# Patient Record
Sex: Female | Born: 1945 | Race: White | Hispanic: No | Marital: Married
Health system: Midwestern US, Community
[De-identification: ages and names within clinical notes are randomized; demographics above are authoritative.]

## PROBLEM LIST (undated history)

## (undated) DIAGNOSIS — M949 Disorder of cartilage, unspecified: Secondary | ICD-10-CM

## (undated) DIAGNOSIS — M899 Disorder of bone, unspecified: Secondary | ICD-10-CM

## (undated) DIAGNOSIS — R609 Edema, unspecified: Secondary | ICD-10-CM

## (undated) DIAGNOSIS — Z1231 Encounter for screening mammogram for malignant neoplasm of breast: Secondary | ICD-10-CM

## (undated) DIAGNOSIS — M25569 Pain in unspecified knee: Secondary | ICD-10-CM

## (undated) DIAGNOSIS — I10 Essential (primary) hypertension: Secondary | ICD-10-CM

## (undated) DIAGNOSIS — R002 Palpitations: Secondary | ICD-10-CM

## (undated) DIAGNOSIS — I519 Heart disease, unspecified: Secondary | ICD-10-CM

## (undated) DIAGNOSIS — E039 Hypothyroidism, unspecified: Secondary | ICD-10-CM

## (undated) DIAGNOSIS — R519 Headache, unspecified: Secondary | ICD-10-CM

## (undated) DIAGNOSIS — Z1211 Encounter for screening for malignant neoplasm of colon: Secondary | ICD-10-CM

## (undated) DIAGNOSIS — M8580 Other specified disorders of bone density and structure, unspecified site: Secondary | ICD-10-CM

## (undated) DIAGNOSIS — N959 Unspecified menopausal and perimenopausal disorder: Secondary | ICD-10-CM

## (undated) DIAGNOSIS — S83209A Unspecified tear of unspecified meniscus, current injury, unspecified knee, initial encounter: Secondary | ICD-10-CM

## (undated) DIAGNOSIS — Z01818 Encounter for other preprocedural examination: Secondary | ICD-10-CM

## (undated) DIAGNOSIS — R0602 Shortness of breath: Secondary | ICD-10-CM

## (undated) DIAGNOSIS — M1990 Unspecified osteoarthritis, unspecified site: Secondary | ICD-10-CM

## (undated) DIAGNOSIS — G4700 Insomnia, unspecified: Secondary | ICD-10-CM

## (undated) DIAGNOSIS — R7303 Prediabetes: Secondary | ICD-10-CM

## (undated) DIAGNOSIS — R112 Nausea with vomiting, unspecified: Secondary | ICD-10-CM

## (undated) DIAGNOSIS — T4145XA Adverse effect of unspecified anesthetic, initial encounter: Secondary | ICD-10-CM

## (undated) DIAGNOSIS — Z9889 Other specified postprocedural states: Secondary | ICD-10-CM

## (undated) DIAGNOSIS — E785 Hyperlipidemia, unspecified: Secondary | ICD-10-CM

## (undated) DIAGNOSIS — T8859XA Other complications of anesthesia, initial encounter: Secondary | ICD-10-CM

## (undated) DIAGNOSIS — K8020 Calculus of gallbladder without cholecystitis without obstruction: Secondary | ICD-10-CM

## (undated) DIAGNOSIS — K219 Gastro-esophageal reflux disease without esophagitis: Secondary | ICD-10-CM

## (undated) DIAGNOSIS — Z1239 Encounter for other screening for malignant neoplasm of breast: Principal | ICD-10-CM

## (undated) HISTORY — DX: Insomnia, unspecified: G47.00

## (undated) HISTORY — DX: Other specified postprocedural states: Z98.890

## (undated) HISTORY — PX: EYE SURGERY: SHX253

## (undated) HISTORY — DX: Hyperlipidemia, unspecified: E78.5

## (undated) HISTORY — PX: VAGINAL HYSTERECTOMY: SUR661

## (undated) HISTORY — DX: Calculus of gallbladder without cholecystitis without obstruction: K80.20

## (undated) HISTORY — PX: DILATION AND CURETTAGE OF UTERUS: SHX78

## (undated) HISTORY — PX: CARDIAC CATHETERIZATION: SHX172

## (undated) HISTORY — PX: ABDOMINAL HYSTERECTOMY: SHX81

## (undated) HISTORY — DX: Prediabetes: R73.03

---
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
## 1990-06-06 HISTORY — PX: BACK SURGERY: SHX140

## 1998-10-28 ENCOUNTER — Other Ambulatory Visit: Admission: RE | Admit: 1998-10-28 | Discharge: 1998-10-28 | Payer: Self-pay | Admitting: Obstetrics & Gynecology

## 2000-01-24 ENCOUNTER — Other Ambulatory Visit: Admission: RE | Admit: 2000-01-24 | Discharge: 2000-01-24 | Payer: Self-pay | Admitting: Obstetrics & Gynecology

## 2000-10-25 ENCOUNTER — Inpatient Hospital Stay (HOSPITAL_COMMUNITY): Admission: RE | Admit: 2000-10-25 | Discharge: 2000-10-28 | Payer: Self-pay | Admitting: Obstetrics & Gynecology

## 2000-10-25 ENCOUNTER — Encounter (INDEPENDENT_AMBULATORY_CARE_PROVIDER_SITE_OTHER): Payer: Self-pay

## 2001-07-05 ENCOUNTER — Other Ambulatory Visit: Admission: RE | Admit: 2001-07-05 | Discharge: 2001-07-05 | Payer: Self-pay | Admitting: Obstetrics & Gynecology

## 2001-11-12 ENCOUNTER — Ambulatory Visit (HOSPITAL_COMMUNITY): Admission: RE | Admit: 2001-11-12 | Discharge: 2001-11-12 | Payer: Self-pay | Admitting: Internal Medicine

## 2001-11-15 ENCOUNTER — Other Ambulatory Visit: Admission: RE | Admit: 2001-11-15 | Discharge: 2001-11-15 | Payer: Self-pay | Admitting: Dermatology

## 2002-08-19 ENCOUNTER — Other Ambulatory Visit: Admission: RE | Admit: 2002-08-19 | Discharge: 2002-08-19 | Payer: Self-pay | Admitting: Obstetrics & Gynecology

## 2002-09-02 ENCOUNTER — Encounter: Payer: Self-pay | Admitting: Family Medicine

## 2002-09-02 ENCOUNTER — Ambulatory Visit (HOSPITAL_COMMUNITY): Admission: RE | Admit: 2002-09-02 | Discharge: 2002-09-02 | Payer: Self-pay | Admitting: Family Medicine

## 2003-06-07 DIAGNOSIS — Z9889 Other specified postprocedural states: Secondary | ICD-10-CM

## 2003-06-07 HISTORY — DX: Other specified postprocedural states: Z98.890

## 2003-10-24 ENCOUNTER — Other Ambulatory Visit: Admission: RE | Admit: 2003-10-24 | Discharge: 2003-10-24 | Payer: Self-pay | Admitting: Specialist

## 2003-10-24 ENCOUNTER — Other Ambulatory Visit: Admission: RE | Admit: 2003-10-24 | Discharge: 2003-10-24 | Payer: Self-pay | Admitting: Obstetrics & Gynecology

## 2003-12-30 ENCOUNTER — Other Ambulatory Visit: Admission: RE | Admit: 2003-12-30 | Discharge: 2003-12-30 | Payer: Self-pay | Admitting: Obstetrics & Gynecology

## 2004-02-25 ENCOUNTER — Ambulatory Visit (HOSPITAL_COMMUNITY): Admission: RE | Admit: 2004-02-25 | Discharge: 2004-02-25 | Payer: Self-pay | Admitting: Family Medicine

## 2004-04-12 ENCOUNTER — Ambulatory Visit (HOSPITAL_COMMUNITY): Admission: RE | Admit: 2004-04-12 | Discharge: 2004-04-12 | Payer: Self-pay | Admitting: Family Medicine

## 2004-04-27 ENCOUNTER — Ambulatory Visit (HOSPITAL_COMMUNITY): Admission: RE | Admit: 2004-04-27 | Discharge: 2004-04-27 | Payer: Self-pay | Admitting: Family Medicine

## 2004-04-27 ENCOUNTER — Inpatient Hospital Stay (HOSPITAL_COMMUNITY): Admission: AD | Admit: 2004-04-27 | Discharge: 2004-04-29 | Payer: Self-pay | Admitting: Family Medicine

## 2004-04-28 ENCOUNTER — Ambulatory Visit: Payer: Self-pay | Admitting: *Deleted

## 2004-05-05 ENCOUNTER — Ambulatory Visit: Payer: Self-pay | Admitting: *Deleted

## 2004-06-17 ENCOUNTER — Other Ambulatory Visit: Admission: RE | Admit: 2004-06-17 | Discharge: 2004-06-17 | Payer: Self-pay | Admitting: Obstetrics & Gynecology

## 2004-08-03 ENCOUNTER — Ambulatory Visit: Payer: Self-pay | Admitting: *Deleted

## 2004-11-11 ENCOUNTER — Other Ambulatory Visit: Admission: RE | Admit: 2004-11-11 | Discharge: 2004-11-11 | Payer: Self-pay | Admitting: Obstetrics & Gynecology

## 2005-05-02 ENCOUNTER — Ambulatory Visit: Payer: Self-pay | Admitting: Orthopedic Surgery

## 2005-05-09 ENCOUNTER — Other Ambulatory Visit: Admission: RE | Admit: 2005-05-09 | Discharge: 2005-05-09 | Payer: Self-pay | Admitting: Obstetrics & Gynecology

## 2005-07-04 ENCOUNTER — Other Ambulatory Visit: Admission: RE | Admit: 2005-07-04 | Discharge: 2005-07-04 | Payer: Self-pay | Admitting: Dermatology

## 2006-01-11 ENCOUNTER — Ambulatory Visit: Payer: Self-pay | Admitting: Orthopedic Surgery

## 2006-02-01 ENCOUNTER — Ambulatory Visit (HOSPITAL_COMMUNITY): Admission: RE | Admit: 2006-02-01 | Discharge: 2006-02-01 | Payer: Self-pay | Admitting: Family Medicine

## 2007-01-19 ENCOUNTER — Ambulatory Visit (HOSPITAL_COMMUNITY): Admission: RE | Admit: 2007-01-19 | Discharge: 2007-01-19 | Payer: Self-pay | Admitting: Family Medicine

## 2009-01-08 LAB — HEPATIC FUNCTION PANEL
A-G Ratio: 1.3 (ref 0.8–1.7)
ALT (SGPT): 37 U/L (ref 30–65)
AST (SGOT): 29 U/L (ref 15–37)
Albumin: 4.2 g/dL (ref 3.4–5.0)
Alk. phosphatase: 139 U/L — ABNORMAL HIGH (ref 50–136)
Bilirubin, direct: 0.2 MG/DL (ref 0.0–0.3)
Bilirubin, total: 0.7 MG/DL (ref 0.1–0.9)
Globulin: 3.3 g/dL (ref 2.0–4.0)
Protein, total: 7.5 g/dL (ref 6.4–8.2)

## 2009-01-08 LAB — TSH 3RD GENERATION: TSH: 0.32 u[IU]/mL — ABNORMAL LOW (ref 0.51–6.27)

## 2009-01-08 LAB — LIPID PANEL
CHOL/HDL Ratio: 2.8 (ref 0–5.0)
Cholesterol, total: 178 MG/DL (ref 0–200)
HDL Cholesterol: 63 MG/DL — ABNORMAL HIGH (ref 40–60)
LDL, calculated: 99.6 MG/DL (ref 0–100)
Triglyceride: 77 MG/DL (ref 0–150)
VLDL, calculated: 15.4 MG/DL

## 2009-01-08 LAB — METABOLIC PANEL, BASIC
Anion gap: 6 mmol/L (ref 5–15)
BUN/Creatinine ratio: 21 — ABNORMAL HIGH (ref 12–20)
BUN: 19 MG/DL — ABNORMAL HIGH (ref 7–18)
CO2: 32 MMOL/L (ref 21–32)
Calcium: 9.6 MG/DL (ref 8.4–10.4)
Chloride: 106 MMOL/L (ref 100–108)
Creatinine: 0.9 MG/DL (ref 0.6–1.3)
GFR est AA: >60 mL/min/{1.73_m2} (ref >60)
GFR est non-AA: >60 mL/min/{1.73_m2} (ref >60)
Glucose: 102 MG/DL — ABNORMAL HIGH (ref 74–99)
Potassium: 4.5 MMOL/L (ref 3.5–5.5)
Sodium: 144 MMOL/L (ref 136–145)

## 2009-01-14 LAB — CULTURE, URINE
Culture result:: NO GROWTH
Culture: NO GROWTH

## 2009-02-03 ENCOUNTER — Ambulatory Visit (HOSPITAL_COMMUNITY): Admission: RE | Admit: 2009-02-03 | Discharge: 2009-02-03 | Payer: Self-pay | Admitting: Family Medicine

## 2010-10-22 NOTE — Op Note (Signed)
[facility]  Patient:    Terri Douglas, Terri Douglas Visit Number: [id number] MRN: [id number]          Service Type: END Location: DAY Attending Physician:  Jonathon Bellows Dictated by:   Roetta Sessions, M.D. Proc. Date: 11/12/01 Admit Date:  11/12/2001   CC:         Dr. Gerda Diss   Operative Report  PROCEDURE:  Screening colonoscopy.  INDICATIONS FOR PROCEDURE:  The patient is a 65 year old lady referred through the courtesy Dr. Gerda Diss for colorectal cancer screening. She is devoid of any lower GI tract symptoms. There is no family history of colorectal neoplasia. She has never had her lower GI tract imaged previously. Colonoscopy is now being done as a standard screening maneuver. This approach has been discussed with the patient at length at the bedside. The potential risks, benefits, and alternatives have been reviewed, questions answered. She is agreeable. She is low risk for conscious sedation and we have Versed and Demerol. Please see my handwritten H&P for more information.  MONITORING:  O2 saturations, blood pressure, pulse and respirations were monitored throughout the entire procedure.  CONSCIOUS SEDATION:  Versed 6 mg IV, Demerol 125 IV in divided doses.  INSTRUMENT:  Olympus video chip adult colonoscope and pediatric colonoscope.  FINDINGS:  Digital rectal exam revealed no abnormalities.  ENDOSCOPIC FINDINGS:  The prep was good.  RECTUM:  Examination of the rectal mucosal including a retroflexed view of the anal verge revealed internal hemorrhoids and two anal papillae, otherwise, rectal mucosa appeared normal.  COLON:  The colonic mucosa was surveyed from the rectosigmoid junction to 40 cm. At 40 cm, the sigmoid colon was noted to be noncompliant and I was unable to overcome the noncompliance to make the turn also sigmoid looping kept me from advancing the scope. In spite of a combination of turning of the patient and external abdominal  pressure, I was unable to get beyond 40 cm with the adult scope. Subsequently, I withdrew the scope and obtained a pediatric scope and again had to struggle to get beyond 40 cm. I was able to negotiate through 40 cm and advance the pediatric scope to the cecum eventually. The cecum, ileocecal valve, and appendiceal orifice were well seen and photographed for the record. The colonic mucosa to the cecum appeared normal. From the level of the cecum and ileocecal valve, the scope was slowly withdrawn. All previously mentioned mucosal surfaces were again seen and no other abnormalities were observed. The patient tolerated the procedure well and was reacted in endoscopy.  IMPRESSION:  1. Internal hemorrhoids with anal papillae, otherwise, normal rectum.  2. Normal colonic mucosa.  RECOMMENDATIONS:  1. Yearly Hemoccults by Dr. Gerda Diss.  2. Repeat colonoscopy in 10 years. Dictated by:   Roetta Sessions, M.D. Attending Physician:  Jonathon Bellows DD:  11/12/01 TD:  11/13/01 Job: [id number]

## 2010-10-22 NOTE — Cardiovascular Report (Signed)
NAME:  Terri Douglas, Terri Douglas NO.:  [id number]   MEDICAL RECORD NO.:  [id number]          PATIENT TYPE:  INP   LOCATION:  3714                         FACILITY:  [facility]   PHYSICIAN:  Carole Binning, M.D. LHCDATE OF BIRTH:  Apr 21, 1946   DATE OF PROCEDURE:  04/28/2004  DATE OF DISCHARGE:                              CARDIAC CATHETERIZATION   PROCEDURE PERFORMED:  Left heart catheterization, left coronary angiography  and left ventriculography.   INDICATION:  Ms. Montez Morita is a 65 year old woman with history of  hyperlipidemia.  She presented to [facility] with progressive  exertional dyspnea culminating in episodes of orthopnea.  She states that  approximately 6-7 weeks ago she had severe pain in her left side which was  somewhat pleuritic in nature.  Her dyspnea began a few days after that.  She  was evaluated at [facility] where cardiac markers showed a very  mild elevation of her troponin and CK-MB.  EKG did not show any acute  changes.  She was referred to [facility] for catheterization to  rule out coronary artery disease.   CATHETERIZATION PROCEDURAL NOTE:  A 6 French sheath was placed in the right  femoral artery.  Left coronary arteriography was performed with a 6 French  JL-3.5 catheter.  The right coronary artery was imaged with a JR-4 catheter.  Left ventriculography was performed with an angled pigtail catheter.  Contrast was Omnipaque.  There were no complications.   CATHETERIZATION RESULTS:   HEMODYNAMICS:  1.  Left ventricular pressure 130/10.  2.  Aortic pressure 130/70.  3.  There is no aortic valve gradient.   LEFT VENTRICULOGRAM:  Wall motion is normal with hyperdynamic left  ventricle.  Ejection fraction is estimated at greater than or equal to 65%.  There was mild mitral regurgitation present which appeared to be secondary  to ventricular ectopy.   CORONARY ARTERIOGRAPHY:  Left main is normal.   Left anterior  descending artery has minor luminal irregularities in the  proximal vessel.  The mid LAD has a 20% stenosis.  The LAD gives rise to a  single normal size diagonal branch.   Left circumflex gives rise to a small ramus intermedius, large first obtuse  marginal branch, and a small second obtuse marginal branch.  There were  minor luminal irregularities in the first obtuse marginal branch.   Right coronary artery is a dominant vessel.  There is a 20% stenosis in the  proximal right coronary artery.  The distal right coronary artery gives rise  to a normal size posterior descending artery, small first posterior lateral  branch, and a large second posterior lateral branch.   IMPRESSION:  1.  Normal left ventricular systolic function.  2.  No significant coronary artery disease identified.   PLAN:  Alternative etiologies for this patient's chest pain will be  investigated.  We will check a D-dimer as a screening test for pulmonary  embolism and threshold for a spiral CT scan.       MWP/MEDQ  D:  04/28/2004  T:  04/28/2004  Job:  [id number]  cc:   Scott A. Gerda Diss, MD  [address]  [place]  [place] [address]  Fax: [phone]   Vida Roller, M.D.  Fax: [phone]

## 2010-10-22 NOTE — Op Note (Signed)
[facility]  Patient:    Terri Douglas, Terri Douglas                       MRN: [id number] Proc. Date: 10/25/00 Adm. Date:  [id number] Attending:  Minette Headland                           Operative Report  PREOPERATIVE DIAGNOSES:       Cystourethrocele, stress incontinence, rectocele, chronic pelvic pain, uterine fibroids.  POSTOPERATIVE DIAGNOSES:      Cystourethrocele, stress incontinence, rectocele, chronic pelvic pain, uterine fibroids, with no evidence of pelvic endometriosis but with some mild fixation of the left ovary to the peritoneum and pelvis.  OPERATION:                    Laparoscopic-assisted vaginal hysterectomy, bilateral salpingo-oophorectomy, anterior and posterior vaginal colporrhaphy.  SURGEON:                      Freddy Finner, M.D.  ASSISTANTWilley Blade, M.D.  ESTIMATED BLOOD LOSS:         200 cc.  INTRAOPERATIVE COMPLICATIONS: None.  INDICATIONS:                  Details of the present illness are recorded in the admission note.  The patient was admitted on the morning of surgery.  She was placed in PSO.  She was given 1 g of Ceftin IV preoperatively.  DESCRIPTION OF PROCEDURE:     She was brought to the operating room and placed under adequate general endotracheal anesthesia, placed in the dorsolithotomy position.  Abdomen, perineum, and vagina were prepped in the usual fashion. The bladder was evacuated with a Robinson catheter.  Hulka tenaculum was attached to the cervix without difficulty under direct visualization.  Sterile drapes were applied.  Two small incisions were made, one at the umbilicus, and one just above the symphysis.  The anterior abdominal wall was elevated manually and an 11 mm disposable trocar introduced without difficulty.  Of note was that there were omental and bowel adhesions just superior to the umbilicus but, on careful inspection through the translucent sleeve of  the trocar, it could be determined that no bowel injury had occurred.  No other apparent abnormality was noted in the upper abdomen.  The appendix was not visualized.  Pelvic structures were visualized.  The ovaries were fairly firmly attached and somewhat atrophic in appearance, consistent with the postmenopausal state.  Based on this, it was elected to release the infundibular pelvic ligaments before proceeding with the vaginal portion of the procedure.  Through the second 5 mm trocar port a grasping forceps was placed and the ovary and tube elevated on each side.  With progressive bites the infundibulopelvic ligaments were fulgurated and then sharply divided. Complete hemostasis was achieved.  Attention was then turned vaginally. Weighted posterior vaginal retractor was placed.  Hulka tenaculum was removed. Jacobs tenaculum was attached to the cervix.  Colpotomy incision was made by attending the mucosa posterior to the cervix.  The cervix was circumscribed with a scalpel to release the mucosa.  Using the Ligasure system, the uterosacral pedicles were developed, coagulated, and divided.   Bladder films were taken separately.  The bladder was further advanced off the  cervix. Cardinal ligament and pedicles were taken with the Ligasure system and divided.  Anterior peritoneum was entered.  The vessel pedicles were taken with the Ligasure system.  The remaining pedicle on the right avulsed with traction on the uterus to deliver on the left side.  This ligament was controlled with Ligasure.  Bleeding was encountered at this level on each side.  On the left it was controlled with Ligasure, and on the right it was controlled with a suture ligature of 0-Monocryl.  Angles of the vagina were anchored to the uterosacrals with a mattress suture of 0-Monocryl. Uterosacrals were plicated, and posterior perineum closed with an interrupted 0-Monocryl.  Posterior two-thirds of the cuff was closed.   Anterior edges of the cuff were then grasped, the mucosa overlying the bladder attended, and incision was made in the midline from the level of the cuff to approximately 1 cm proximal to the urethral meatus.  With careful sharp and blunt dissection the bladder was freed from the mucosa.  The vesicovaginal fascia was then plicated with interrupted sutures of 0-Monocryl to reelevate the angle and to plicate and reduce the cystocele.  Urethral length was checked and was approximately 3 cm.  Suburethral suture was placed for support in this location also.  Segments of mucosa were excised anteriorly and the mucosa then closed with a running 2-0 Monocryl suture.  Attention was turned posteriorly. Fourchette was grasped on each side with an Allis.  A small, pyramidal-shaped segment of skin was excised in the perineal body.  The mucosa overlying the rectum was freed with blunt and sharp dissection and an incision made posteriorly in the mucosa.  A total of three plication sutures were then placed using 0-Monocryl to reduce the rectocele and recreate the rectovaginal septum and to elevate the levators.  The mucosa and skin were closed with a running 2-0 Monocryl suture in a fashion similar to episiotomy closure.  The case was so dry it was elected not to place a pack.  A Foley catheter was placed in the bladder, and it was filled with approximately 300 cc of irrigating solution.  Banana catheter was placed suprapubically and anchored to the skin with nylon sutures.  The bladder was drained.  Attention was then returned to the abdominal procedure.  The Nezhat irrigation system was used, and irrigation of the pedicles revealed some minimal weeping sources at the cul-de-sac and along the cuff posteriorly.  These were easily controlled with bipolar coagulation.  The irrigating solution was removed from the abdomen by wall suction.  Hemostasis was complete at this point, and the procedure was terminated.   The instruments were removed.  Gas was allowed to escape from the abdomen.  One-half-percent Marcaine was injected through the incision sites for postoperative analgesia.  Skin incisions were closed with interrupted  subcuticular sutures of 3-0 Dexon.  Steri-Strips were applied to the lower incision.  The patient tolerated the procedure well and was taken to recovery in good condition. DD:  10/25/00 TD:  10/25/00 Job: [id number]

## 2010-10-22 NOTE — Discharge Summary (Signed)
NAMEHAILLE, PARDI                ACCOUNT NO.:  [id number]   MEDICAL RECORD NO.:  [id number]           PATIENT TYPE:   LOCATION:                                FACILITY:  [facility]   PHYSICIAN:  Scott A. Gerda Diss, MD    DATE OF BIRTH:  [birth year]   DATE OF ADMISSION:  04/27/2004  DATE OF DISCHARGE:  11/23/2005LH                                 DISCHARGE SUMMARY   DIAGNOSES:  1.  Chest pain, suspicious for coronary artery disease.  2.  Abnormal troponin.   HOSPITAL COURSE:  Patient admitted in with chest pain, slightly abnormal MB.  The following morning, slightly abnormal troponin.  Concerning for acute  coronary syndrome.  She was already on heparin as well as Norvasc.  Her  chest pain had gone by the day of the 23rd, but it is felt that she needed  to have a catheterization done to delineate this further, so therefore she  was sent for catheterization in good condition.     Scot   SAL/MEDQ  D:  05/06/2004  T:  05/06/2004  Job:  [id number]

## 2010-10-22 NOTE — H&P (Signed)
[facility]  Patient:    Terri Douglas, HEADINGS                         MRN: [id number] Adm. Date:  10/25/00 Attending:  Freddy Finner, M.D.                         History and Physical  ADMITTING DIAGNOSES:          1. Uterine leiomyomata.                               2. Chronic left pelvic pain.                               3. Previous history of endometriosis.                               4. Symptomatic small cystourethrocele with                                  stress urinary incontinence.                               5. Asymptomatic rectocele.  HISTORY OF PRESENT ILLNESS:   Patient is a 65 year old white married female, gravida 4, para 3, who over the years has had early menopausal symptoms and has been treated with various hormone replacement therapies, has had dysfunctional uterine bleeding on several occasions, with ultimately negative biopsies.  She had minimal evidence of endometriosis at laparoscopy in 1990 and hysteroscopy/D&C with benign endometrium at that time.  Over the recent past, she has had persistent left lower quadrant pain, which has been intermittently present for the last five to six months.  She has persistent symptoms of stress urinary incontinence.  She has had irregular vaginal bleeding.  At the present time, she is on hormone replacement therapy, specifically Premarin 0.625 mg b.i.d. and Prometrium 200 mg 1 p.o. at h.s., 1 through 12, each cycle; she uses Premarin vaginal cream on a regular basis also.  She has requested definitive surgical intervention with her significant history of fibroids, endometriosis and stress incontinence; she is now admitted for laparoscopically assisted vaginal hysterectomy, bilateral salpingo-oophorectomy, anterior and posterior vaginal repair.  REVIEW OF SYSTEMS:            Her current review of systems is otherwise negative.  There are no chronic pulmonary, GI or GU complaints.  PAST  MEDICAL HISTORY:         She does have a history of sciatic back pain. She has no other history of significant medical illnesses.  She does have a history of previous breast augmentation with revision in 1994.  SOCIAL HISTORY:               She does not smoke or does not use alcohol on a regular basis.  ALLERGIES:                    She has had an allergic-type reaction to PHENOBARBITAL but to no other known medications.  FAMILY HISTORY:  Noncontributory.  PHYSICAL EXAMINATION:  GENERAL:                      The patient is a well-developed, well-nourished young white female in no acute distress at the time of exam.  HEENT:                        Grossly within normal limits.  NECK:                         There is no palpable increase in her thyroid.  CHEST:                        Clear to auscultation.  BREASTS:                      Exam reveals dense changes in the upper-outer quadrants bilaterally but no discrete masses.  HEART:                        Normal sinus rhythm, without murmurs, rubs, or gallops.  ABDOMEN:                      Soft, nontender.  No appreciable organomegaly or palpable masses.  EXTREMITIES:                  Without cyanosis, clubbing or edema.  PELVIC:                       External genitalia are normal.  There is mild relaxation of the vaginal outlet.  To speculum exam, there is loss of the urethrovesical angle on Valsalva but minimal cystocele.  There is a first degree rectocele.  Bimanual reveals the uterus to be anterior in position and slightly increased in size to clinical exam in the office.  There is moderate tenderness to deep palpation in the left adnexa.  RECTAL:                       The rectum is palpably normal except for the rectocele and confirms the above findings.  ASSESSMENT:                   1. Pelvic pain.                               2. Previous endometriosis.                               3. Uterine  leiomyomata.                               4. Stress urinary incontinence with cystocele                                  and rectocele.  PLAN:                         Laparoscopically assisted vaginal hysterectomy and bilateral salpingo-oophorectomy.  The patient has reviewed videos in the office describing the procedures, including the  potential risks of the procedures.  She is well-advised and is prepared to proceed with surgery. DD:  10/24/00 TD:  10/24/00 Job: [id number]

## 2010-10-22 NOTE — Consult Note (Signed)
[name], [name] NO.:  [id number]   MEDICAL RECORD NO.:  [id number]          PATIENT TYPE:  INP   LOCATION:  A214                          FACILITY:  [facility]   PHYSICIAN:  Terri Douglas, M.D.   DATE OF BIRTH:  Nov 12, 1945   DATE OF CONSULTATION:  DATE OF DISCHARGE:                                   CONSULTATION   PRIMARY CARE PHYSICIAN:  Dr. Lorin Picket A. Douglas.   HISTORY OF PRESENT ILLNESS:  [name] is a 65 year old female with a past  medical history significant for dyslipidemia who presented to her primary  care physician with complaints of increased dyspnea on exertion and  generalized weakness and malaise.  She states that over the last couple of  weeks, she has noted difficulty getting a deep breath when she is walking or  outside raking leaves.  She denies any frank chest discomfort.  She denies  nausea, vomiting or diaphoresis with this.  She also states that she awoke  the night before last with dyspnea relieved after sitting up in the bed.  She states approximately two weeks ago, she had a pulled muscle in her left  side.  She is unclear of the mechanism of injury.  However, she had  significant pain in her left side which is now resolved.  She states since  that injury, she had noticed this increased dyspnea with exertion.   PAST MEDICAL HISTORY:  Dyslipidemia.  The last lipid profile in March of  2005 revealed a total cholesterol of 226, triglycerides  155, LDL of 139,  HDL 56.  She is status post back surgery, status post neck surgery, status  post total hysterectomy in 2002.  No previous cardiac history.   ALLERGIES:  PHENOBARBITAL injection.   MEDICATIONS PRIOR TO ADMISSION:  1.  Premarin 1.25 mg daily.  2.  Centrum Silver daily.  3.  Celexa.  The patient had previously been taking this, however stopped.  4.  Xanax 1 mg q.h.s.   MEDICATIONS IN THE HOSPITAL:  1.  Norvasc 5 mg daily.  2.  Aspirin 325 mg daily.  3.  Celexa 40 mg daily.  4.   Heparin drip.   SOCIAL HISTORY:  [name] lives in [place] alone.  She is currently  married.  However, she is going through a divorce and experiencing a lot of  anxiety and depression secondary to this.  She denies any history of tobacco  abuse, alcohol use or illicit drug use.  She does walk occasionally;  however, not on a regular basis.  She also does yard work such as Therapist, occupational grass.   FAMILY HISTORY:  Mother deceased at 62 years old secondary to bone cancer.  Father is deceased at 57 years old secondary to myocardial infarction.  Also, a history of prostate cancer.  One brother is alive at 44 years old  with a history of coronary artery bypass grafting in his mid 36's and also  cerebrovascular disease.   REVIEW OF SYSTEMS:  Essentially negative with the exception of HPI, positive  nasal congestion and  some arthralgias after a fall.   PHYSICAL EXAMINATION:  VITAL SIGNS:  Temperature 97.1, pulse 77,  respirations 20, blood pressure 116/67.  Weight 147, oxygen saturation is  98% on four liters.  GENERAL:  This is a well-developed, well-nourished white female, in no acute  distress who is alert and oriented x4.  HEENT:  Normocephalic, atraumatic.  Pupils equal, round and reactive to  light.  NECK:  No jugular venous distension, no carotid bruits.  CARDIOVASCULAR:  Regular rate and rhythm, S1, S2, normal.  LUNGS:  Clear to auscultation bilaterally.  SKIN:  Warm and dry with no rashes.  BREASTS:  Medical breast exam is deferred.  ABDOMEN:  Soft, nontender with active bowel sounds.  GU/RECTAL EXAM:  Deferred.  EXTREMITIES:  No cyanosis, clubbing or edema.  PULSES:  Distal pulses are intact laterally.  No femoral bruits are noted.  NEUROLOGIC:  She is alert and oriented x3 with cranial nerves II-XII grossly  intact.   Chest x-ray reveals no active lung disease.  Electrocardiogram, normal sinus  rhythm at 69 beats per minute with normal axis, poor R wave  progression.  Normal PR interval and QRS duration, and QTC.   LABORATORY DATA:  Sodium 136, potassium 3.3, chloride 103, CO2 28, BUN 10,  creatinine 0.8, glucose 95.   Cardiac enzymes, first set CK 194, MB 5.9, troponin 0.02.  Second set, CK  180, MB 5.8, troponin 0.02.  Third set, CK 136, MB 4.1, and troponin 0.42.  Calcium is 8.2.  PT 12.3, INR 0.9.   IMPRESSION/PLAN:  1.  Non-ST elevation myocardial infarction with minimal troponin bump in a      65 year old female with cardiac risk factors including dyslipidemia and      family history.  Considering her abnormal EKG and bump in her troponin,      I feel it would be appropriate to transfer her to [facility]      for cardiac catheterization for further evaluation.  We will continue      her on her heparin drip as listed above.  Her heart rate and blood      pressure are well controlled currently.  2.  Hypokalemia.  We will replace the potassium and monitor this closely.  3.  Dyslipidemia.  Last lipid profile as noted above.  The patient would      likely benefit from statin therapy.  This can be discussed with her      during her hospitalization at [facility] and at discharge.     Amy   AB/MEDQ  D:  04/28/2004  T:  04/28/2004  Job:  [id number]

## 2010-10-22 NOTE — H&P (Signed)
Terri Douglas, Terri Douglas                ACCOUNT NO.:  [id number]   MEDICAL RECORD NO.:  [id number]          PATIENT TYPE:  INP   LOCATION:  A214                          FACILITY:  [facility]   PHYSICIAN:  Scott A. Gerda Diss, MD    DATE OF BIRTH:  03/24/1946   DATE OF ADMISSION:  04/27/2004  DATE OF DISCHARGE:  LH                                HISTORY & PHYSICAL   The patient states that her chief complaint is feeling bad.   HISTORY OF PRESENT ILLNESS:  This 66 year old white female states she  started having just a global sensation of feeling bad over the past several  days.  She states she has been stressed out, at times crying, related to a  divorce she is going through, and at times has had some anxiety spells, but  she relates her primary problem that she has really noticed is feeling bad  over the past few days with feeling short of breath when she walks around,  and she just denies any chest heaviness or tightness but states she has had  some sharp pains on the left upper portion of her chest that last a couple  of minutes and go away, last a couple of minutes and go away again.  She  also relates having some spells with brief activity where she just feels  like she cannot get a good, deep breath.  She denies any PND.  She denies  swelling in the legs.  Denies burning or discomfort down into the left arm  or up into the jaw line.  Denies reflux symptoms, vomiting, fever, cough.   PAST MEDICAL HISTORY:  The patient has had problems with some anxiety  related to some recent social situations.  Also has a history of  hyperlipidemia from 1997 forward.  Also has been on Premarin for menopausal  symptoms.  She has had a history of surgery in the past, back and neck  surgery in 1992 and 1996 by Dr. Danielle Dess, a hysterectomy, total, in May 2002.   FAMILY HISTORY:  Negative for hypertension but positive for heart disease.   SOCIAL HISTORY:  Married but going through a divorce.  Does not smoke.   ALLERGIES:  None.   MEDICATIONS:  1.  Premarin 1.25 mg daily.  2.  Centrum Silver.  3.  Celexa, was taking it but stopped recently.  4.  Xanax 1 mg, uses at bedtime to help her sleep.   PHYSICAL EXAMINATION:  GENERAL:  NAD.  HEENT:  Benign.  TMs and LT/NL.  NECK:  No masses.  CHEST:  CTA, no crackles.  CARDIAC:  Regular, no gallop.  ABDOMEN:  Soft.  EXTREMITIES:  No edema.  SKIN:  Warm and dry.  VASCULAR:  Pulses good.  NEUROLOGIC:  Good.   Of note, her lipid profile in March 2005 showed a total cholesterol of 226,  triglycerides 155, LDL 139, HDL 56, and a cholesterol/HDL ratio at 4.0.  EKG  in the office does not show any acute ST segment changes.  It shows a poor R-  wave progression V1 through V3, otherwise  normal sinus rhythm.  A chest x-  ray was ordered and was negative, and a cardiac enzyme was ordered and came  back as a CK total of 194, an MB of 5.9, a relative index 3.0, a D-dimer of  0.26, and a troponin of 0.02.  Because of the borderline cardiac enzymes and  the complaint of shortness of breath, it was felt the patient should be  admitted.   ASSESSMENT/PLAN:  1.  Shortness of breath.  Could be an anginal equivalent.  I feel that the      patient needs to be admitted and will consult cardiology.  The patient      may well need a stress Cardiolite.  We will do serial enzymes.  If more      abnormalities with this, she may well need to have catheterization.  2.  Monitor blood pressure while in the hospital.  3.  If patient does show to have heart disease, more vigorous treatment of      her LDL will be needed with statins.  The patient had been trying to      diet recently.  4.  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  I encouraged the patient to get back on      her Celexa 40 mg daily.  5.  I have encouraged the patient to come off of the Premarin because of      increased risk of heart disease with this.     Scot   SAL/MEDQ  D:  04/28/2004  T:  04/28/2004  Job:   [id number]

## 2010-10-22 NOTE — Discharge Summary (Signed)
[name], [name]:  [id number]   MEDICAL RECORD NO.:  [id number]          PATIENT TYPE:  INP   LOCATION:  3714                         FACILITY:  [facility]   PHYSICIAN:  Salvadore Farber, M.D. LHCDATE OF BIRTH:  [birth year]   DATE OF ADMISSION:  04/28/2004  DATE OF DISCHARGE:  04/29/2004                           DISCHARGE SUMMARY - REFERRING   DISCHARGE DIAGNOSES:  1.  Elevated troponin.  2.  Musculoskeletal chest pain.  3.  Nonobstructive coronary artery disease.  4.  Normal ejection fraction.  5.  Dyslipidemia history.  6.  Status post total hysterectomy.  7.  Status post back and neck surgery in 1992 and 1996.   HOSPITAL COURSE:  [name] is a 65 year old female who presented with  increased dyspnea on exertion.  She noted difficulty with inspiration while  outside raking leaves.  She denied any chest discomfort, nausea, vomiting,  or diaphoresis.  About two weeks prior to her admission she pulled a muscle  on her left side.  She was admitted for evaluation and her cardiac enzymes  were essentially negative in respect to relative index.  However, her  troponin peaked at 0.42.  She then was transferred to [facility] for  cardiac catheterization.  She was found to have nonobstructive disease.  Her  history is most suggestive of musculoskeletal injury with two normal  troponins and then a troponin that was slightly elevated.  We will assume  that she had an acute coronary syndrome with the resolution of thrombus.  We  will go ahead and add aspirin but the troponins were definitely confusing  and her catheterization was indicative of nonobstructive disease.   She will be discharged to home in stable condition.  She is to continue her  home medications.  She is to add aspirin 81 mg daily.  Her follow-up has  been arranged with Dr. Dorethea Clan.  She is to clean over catheterization site  with soap and water, no scrubbing.  Call for any questions or concerns  at  [phone].  No straining or heavy lifting for the next week.  At her follow-  up visit we need to discuss with her her dyslipidemia.  Her last profile was  in March 2005 and she was found to have a total cholesterol 226,  triglycerides 155, LDL 139, and HDL 56.  As far as her laboratory  work at [facility] lipid profile was not repeated and it is unclear to me  at this time whether or not she had that repeated at [facility] prior to her  transfer.  If she has not had this needs to have it repeated and then we  need to entertain idea of Statin therapy.      Larita Fife   LB/MEDQ  D:  04/29/2004  T:  04/29/2004  Job:  [id number]   cc:   Lorin Picket A. Gerda Diss, MD  [address]  [place]  [place] [address]  Fax: [phone]   Vida Roller, M.D.  Fax: [phone]

## 2010-10-22 NOTE — Discharge Summary (Signed)
[facility]  Patient:    Terri Douglas, Terri Douglas                       MRN: [id number] Adm. Date:  [id number] Disc. Date: 10/28/00 Attending:  Minette Headland                           Discharge Summary  DISCHARGE DIAGNOSES:          Symptomatic cystic urethrocele, rectocele, chronic pelvic pain, dysfunctional uterine bleeding, uterine adenomyosis, uterine leiomyoma.  PROCEDURE:                    Laparoscopically assisted vaginal hysterectomy, bilateral salpingo-oophorectomy, anterior and posterior colporrhaphy.  COMPLICATIONS:                No significant complications, though she had a rather delayed return of bowel function.  DISPOSITION:                  At the time of her discharge the patient is now having adequate bowel function.  She still has a suprapubic catheter in place, but is comfortable operating the catheter for residuals.  She is discharged home with the catheter in place.  She is to take a regular diet.  She is to take Premarin 0.625 mg b.i.d. for hormonal replacement therapy.  She is to take Tylox as needed for postoperative pain.  She is to take Colace.  She is to use ProctoFoam HC for hemorrhoids.  She is to use sitz baths as needed. She is to call the office for residuals of 50 cc or less and voided volumes of 200 or more for catheter removal.  She is to return to the office in approximately two weeks for her first postoperative visit.  HISTORY OF PRESENT ILLNESS:   Details of the present illness, past history, review of systems, and physical examination recorded in the admission note. The patient was admitted at this time for surgery.  PHYSICAL EXAMINATION:         As recorded in the postoperative diagnoses.  LABORATORIES:                 Admission CBC was normal with hemoglobin of 12.9.  Postoperative hemoglobin 9.6 on both the first and second postoperative days.  Admission prothrombin time, PTT, and INR were normal.  The  urinalysis on admission was normal.  HOSPITAL COURSE:              Patient was admitted on the morning of surgery. The above described surgical procedure was accomplished.  She was treated perioperatively with IV antibiotic and with anti-embolus PASOs.  Her postoperative recovery was without major complications.  She did complain of dizziness on the day of surgery and early on the first postoperative day.  On the second postoperative day she remained afebrile but she was having inability to use her bowels.  She was still unable to void through the urethra.  It was elected to keep her for an additional day and by the following day she was doing significantly better with adequate bowel function, improved symptoms from the rectal pain which she experienced because of hemorrhoid.  Her condition was considered to be satisfactory and she was discharged home with disposition as noted above. DD:  10/28/00 TD:  10/28/00 Job: [id number]

## 2010-11-23 ENCOUNTER — Ambulatory Visit (HOSPITAL_COMMUNITY)
Admission: RE | Admit: 2010-11-23 | Discharge: 2010-11-23 | Disposition: A | Discharge status: Home / Self Care | Payer: BC Managed Care – PPO | Source: Ambulatory Visit | Attending: Family Medicine | Admitting: Family Medicine

## 2010-11-23 ENCOUNTER — Other Ambulatory Visit: Payer: Self-pay | Admitting: Family Medicine

## 2010-11-23 DIAGNOSIS — M25571 Pain in right ankle and joints of right foot: Secondary | ICD-10-CM

## 2010-11-23 DIAGNOSIS — M25579 Pain in unspecified ankle and joints of unspecified foot: Secondary | ICD-10-CM | POA: Insufficient documentation

## 2011-02-14 ENCOUNTER — Other Ambulatory Visit: Payer: Self-pay | Admitting: Family Medicine

## 2011-02-15 ENCOUNTER — Other Ambulatory Visit: Payer: Self-pay | Admitting: Family Medicine

## 2011-02-15 ENCOUNTER — Ambulatory Visit (HOSPITAL_COMMUNITY)
Admission: RE | Admit: 2011-02-15 | Discharge: 2011-02-15 | Disposition: A | Discharge status: Home / Self Care | Payer: BC Managed Care – PPO | Source: Ambulatory Visit | Attending: Family Medicine | Admitting: Family Medicine

## 2011-02-15 DIAGNOSIS — K8020 Calculus of gallbladder without cholecystitis without obstruction: Secondary | ICD-10-CM | POA: Insufficient documentation

## 2011-02-15 DIAGNOSIS — R109 Unspecified abdominal pain: Secondary | ICD-10-CM | POA: Insufficient documentation

## 2011-02-15 DIAGNOSIS — M549 Dorsalgia, unspecified: Secondary | ICD-10-CM

## 2011-02-15 DIAGNOSIS — K449 Diaphragmatic hernia without obstruction or gangrene: Secondary | ICD-10-CM | POA: Insufficient documentation

## 2011-02-15 DIAGNOSIS — Q619 Cystic kidney disease, unspecified: Secondary | ICD-10-CM | POA: Insufficient documentation

## 2011-02-15 DIAGNOSIS — K5909 Other constipation: Secondary | ICD-10-CM | POA: Insufficient documentation

## 2011-02-15 MED ORDER — IOHEXOL 300 MG/ML  SOLN
100.0000 mL | Freq: Once | INTRAMUSCULAR | Status: AC | PRN
  Administered 2011-02-15: 100 mL via INTRAVENOUS

## 2011-02-16 ENCOUNTER — Other Ambulatory Visit: Payer: Self-pay | Admitting: Family Medicine

## 2011-02-17 ENCOUNTER — Encounter (HOSPITAL_COMMUNITY)
Admission: RE | Admit: 2011-02-17 | Discharge: 2011-02-17 | Disposition: A | Discharge status: Home / Self Care | Payer: BC Managed Care – PPO | Source: Ambulatory Visit | Attending: Family Medicine | Admitting: Family Medicine

## 2011-02-17 ENCOUNTER — Encounter (HOSPITAL_COMMUNITY): Payer: Self-pay

## 2011-02-17 DIAGNOSIS — R109 Unspecified abdominal pain: Secondary | ICD-10-CM | POA: Insufficient documentation

## 2011-02-17 HISTORY — DX: Essential (primary) hypertension: I10

## 2011-02-17 MED ORDER — TECHNETIUM TC 99M MEBROFENIN IV KIT
5.0000 | PACK | Freq: Once | INTRAVENOUS | Status: AC | PRN
  Administered 2011-02-17: 5.1 via INTRAVENOUS

## 2011-02-17 MED ORDER — SINCALIDE 5 MCG IJ SOLR
0.0200 ug/kg | Freq: Once | INTRAMUSCULAR | Status: AC
  Administered 2011-02-17: 1.62 ug via INTRAVENOUS

## 2011-03-10 ENCOUNTER — Encounter (INDEPENDENT_AMBULATORY_CARE_PROVIDER_SITE_OTHER): Payer: Self-pay | Admitting: *Deleted

## 2011-03-11 ENCOUNTER — Encounter (INDEPENDENT_AMBULATORY_CARE_PROVIDER_SITE_OTHER): Payer: Self-pay | Admitting: *Deleted

## 2011-03-29 ENCOUNTER — Encounter (INDEPENDENT_AMBULATORY_CARE_PROVIDER_SITE_OTHER): Payer: Self-pay | Admitting: *Deleted

## 2011-03-29 ENCOUNTER — Other Ambulatory Visit (INDEPENDENT_AMBULATORY_CARE_PROVIDER_SITE_OTHER): Payer: Self-pay | Admitting: *Deleted

## 2011-03-29 ENCOUNTER — Ambulatory Visit (INDEPENDENT_AMBULATORY_CARE_PROVIDER_SITE_OTHER): Payer: BC Managed Care – PPO | Admitting: Internal Medicine

## 2011-03-29 ENCOUNTER — Encounter (INDEPENDENT_AMBULATORY_CARE_PROVIDER_SITE_OTHER): Payer: Self-pay | Admitting: Internal Medicine

## 2011-03-29 DIAGNOSIS — E7800 Pure hypercholesterolemia, unspecified: Secondary | ICD-10-CM | POA: Insufficient documentation

## 2011-03-29 DIAGNOSIS — I10 Essential (primary) hypertension: Secondary | ICD-10-CM | POA: Insufficient documentation

## 2011-03-29 DIAGNOSIS — R1013 Epigastric pain: Secondary | ICD-10-CM

## 2011-03-29 NOTE — Patient Instructions (Addendum)
Will schedule and EGD with Dr. Rehman 

## 2011-03-29 NOTE — Progress Notes (Signed)
Subjective:     Patient ID: Terri Douglas, female   DOB: [birth year], 65 y.o.   MRN: [id number]  HPI Terri Douglas is a 66 yr old female referred to our office by Lilyan Punt for abdominal pain.  She c/o epigastric pain.  If she bends over sometimes she will feel a knot in her rt upper quadrant.  Symptoms  4-5 months.  The cramp has been occuring for over a year. The cramp occurs when she moves a certain way or she bends over to put her hose on.  Epigastric pain occur randomly.  It is not related to food.  Protnix controls her acid reflux.  HIDA scan on 02/17/2011  Normal exam.  CT abdomen and pelvis with CM 02/15/2011:  Cholelithiasis, Small left peripelvic renal cysts, Small umbilical hernia. She says she burps more than she use to. Her stomach growls more than it use to.  She has slight epigastric discomfort.  Appetite is good. No weight loss. No melena or bright red rectal bleeding. Acid reflux controlled with Protonix.  Review of Systems  See hpi Current Outpatient Prescriptions  Medication Sig Dispense Refill  . ALPRAZolam (XANAX) 1 MG tablet Take 1 mg by mouth at bedtime as needed.        Marland Kitchen aspirin 81 MG tablet Take 81 mg by mouth daily.        . Biotin 10 MG TABS Take by mouth.        . fish oil-omega-3 fatty acids 1000 MG capsule Take 2 g by mouth daily.        Marland Kitchen losartan (COZAAR) 50 MG tablet Take 50 mg by mouth daily.        . pantoprazole (PROTONIX) 40 MG tablet Take 40 mg by mouth daily.        . psyllium (METAMUCIL) 58.6 % packet Take 1 packet by mouth daily.        . simvastatin (ZOCOR) 40 MG tablet Take 40 mg by mouth at bedtime.        . torsemide (DEMADEX) 20 MG tablet Take 20 mg by mouth daily.         Past Surgical History  Procedure Date  . Neck surgery x3  . Back surgery   . Vaginal hysterectomyh   . Dilation and curettage of uterus    Past Medical History  Diagnosis Date  . Hypertension   . High cholesterol   . Hypertension    No Known Allergies History   Social  History Narrative  . No narrative on file   History   Social History  . Marital Status: Divorced    Spouse Name: N/A    Number of Children: N/A  . Years of Education: N/A   Occupational History  . Not on file.   Social History Main Topics  . Smoking status: Never Smoker   . Smokeless tobacco: Not on file  . Alcohol Use: No  . Drug Use: No  . Sexually Active: Not on file   Other Topics Concern  . Not on file   Social History Narrative  . No narrative on file   Family Status  Relation Status Death Age  . Mother Deceased     bone cancer  . Father Deceased     prostate  . Brother Alive     prostate cancer  . Child Other     one deceased in an accident. two in good health       Objective:   Physical  Exam  Filed Vitals:   03/29/11 1143  BP: 122/62  Pulse: 72  Temp: 98 F (36.7 C)  Height: 5\' 2"  (1.575 m)  Weight: 152 lb 4.8 oz (69.083 kg)    Alert and oriented. Skin warm and dry. Oral mucosa is moist. Natural teeth in good condition. Sclera anicteric, conjunctivae is pink. Thyroid not enlarged. No cervical lymphadenopathy. Lungs clear. Heart regular rate and rhythm.  Abdomen is soft. Bowel sounds are positive. No hepatomegaly. No abdominal masses felt. No tenderness.  No edema to lower extremities. Patient is alert and oriented.      Assessment:    Epigastric pain.  PUD needs to be ruled out.   Plan:    EGD in the near future.   The risks and benefits such as perforation, bleeding, and infection were reviewed with the patient and is agreeable.

## 2011-04-14 MED ORDER — SODIUM CHLORIDE 0.45 % IV SOLN
Freq: Once | INTRAVENOUS | Status: AC
  Administered 2011-04-15: 11:00:00 via INTRAVENOUS

## 2011-04-15 ENCOUNTER — Encounter (HOSPITAL_COMMUNITY): Payer: Self-pay | Admitting: *Deleted

## 2011-04-15 ENCOUNTER — Encounter (HOSPITAL_COMMUNITY)
Admission: RE | Disposition: A | Discharge status: Home / Self Care | Payer: Self-pay | Source: Ambulatory Visit | Attending: Internal Medicine

## 2011-04-15 ENCOUNTER — Ambulatory Visit (HOSPITAL_COMMUNITY)
Admission: RE | Admit: 2011-04-15 | Discharge: 2011-04-15 | Disposition: A | Discharge status: Home / Self Care | Payer: BC Managed Care – PPO | Source: Ambulatory Visit | Attending: Internal Medicine | Admitting: Internal Medicine

## 2011-04-15 ENCOUNTER — Other Ambulatory Visit (INDEPENDENT_AMBULATORY_CARE_PROVIDER_SITE_OTHER): Payer: Self-pay | Admitting: Internal Medicine

## 2011-04-15 DIAGNOSIS — Z7982 Long term (current) use of aspirin: Secondary | ICD-10-CM | POA: Insufficient documentation

## 2011-04-15 DIAGNOSIS — R1011 Right upper quadrant pain: Secondary | ICD-10-CM

## 2011-04-15 DIAGNOSIS — I10 Essential (primary) hypertension: Secondary | ICD-10-CM | POA: Insufficient documentation

## 2011-04-15 DIAGNOSIS — E7800 Pure hypercholesterolemia, unspecified: Secondary | ICD-10-CM | POA: Insufficient documentation

## 2011-04-15 DIAGNOSIS — D131 Benign neoplasm of stomach: Secondary | ICD-10-CM | POA: Insufficient documentation

## 2011-04-15 DIAGNOSIS — Z79899 Other long term (current) drug therapy: Secondary | ICD-10-CM | POA: Insufficient documentation

## 2011-04-15 DIAGNOSIS — R1013 Epigastric pain: Secondary | ICD-10-CM | POA: Insufficient documentation

## 2011-04-15 DIAGNOSIS — K229 Disease of esophagus, unspecified: Secondary | ICD-10-CM

## 2011-04-15 DIAGNOSIS — R1310 Dysphagia, unspecified: Secondary | ICD-10-CM | POA: Insufficient documentation

## 2011-04-15 HISTORY — DX: Other specified postprocedural states: Z98.890

## 2011-04-15 HISTORY — DX: Nausea with vomiting, unspecified: R11.2

## 2011-04-15 HISTORY — PX: ESOPHAGOGASTRODUODENOSCOPY: SHX5428

## 2011-04-15 SURGERY — EGD (ESOPHAGOGASTRODUODENOSCOPY)
Anesthesia: Moderate Sedation

## 2011-04-15 MED ORDER — MEPERIDINE HCL 50 MG/ML IJ SOLN
INTRAMUSCULAR | Status: AC
  Filled 2011-04-15: qty 1

## 2011-04-15 MED ORDER — BUTAMBEN-TETRACAINE-BENZOCAINE 2-2-14 % EX AERO
INHALATION_SPRAY | CUTANEOUS | Status: DC | PRN
  Administered 2011-04-15: 2 via TOPICAL

## 2011-04-15 MED ORDER — MEPERIDINE HCL 25 MG/ML IJ SOLN
INTRAMUSCULAR | Status: DC | PRN
  Administered 2011-04-15 (×2): 25 mg via INTRAVENOUS

## 2011-04-15 MED ORDER — MIDAZOLAM HCL 5 MG/5ML IJ SOLN
INTRAMUSCULAR | Status: DC | PRN
  Administered 2011-04-15 (×2): 2 mg via INTRAVENOUS
  Administered 2011-04-15: 1 mg via INTRAVENOUS
  Administered 2011-04-15: 2 mg via INTRAVENOUS
  Administered 2011-04-15: 1 mg via INTRAVENOUS

## 2011-04-15 MED ORDER — STERILE WATER FOR IRRIGATION IR SOLN
Status: DC | PRN
  Administered 2011-04-15: 12:00:00

## 2011-04-15 MED ORDER — MIDAZOLAM HCL 5 MG/5ML IJ SOLN
INTRAMUSCULAR | Status: AC
  Filled 2011-04-15: qty 10

## 2011-04-15 MED ORDER — MIDAZOLAM HCL 5 MG/5ML IJ SOLN
INTRAMUSCULAR | Status: AC
  Filled 2011-04-15: qty 5

## 2011-04-15 NOTE — Op Note (Addendum)
EGD PROCEDURE REPORT  PATIENT:  Terri Douglas  MR#:  [id number] Birthdate:  [birth year], 65 y.o., female Endoscopist:  Dr. Malissa Hippo, MD Referred By:  Dr. Fay Records, MD. Procedure Date: 04/15/2011  Procedure:   EGD  Indications:  Patient is 65 year old Caucasian female with recurrent epigastric and right upper quadrant and bloating. She is single gallstone but her height her skin reveals normal EF. He is undergoing EGD to make sure she does not have peptic ulcer disease. She also bands of dysphagia but it is only to  Pills. Her heartburn is well controlled with anti-reflex measures and pantoprazole.           Informed Consent:  Procedure and risks were reviewed with the patient and informed consent was obtained. Medications:  Demerol 50 mg IV Versed 8 mg IV Cetacaine spray topically for oropharyngeal anesthesia  Description of procedure:  The endoscope was introduced through the mouth and advanced to the second portion of the duodenum without difficulty or limitations. The mucosal surfaces were surveyed very carefully during advancement of the scope and upon withdrawal.  Findings:  Esophagus:  Mucosa of the proximal and middle segment was normal. GE junction was wavy with one or possibly two tongues of pink mucosa. It was biopsied to rule out short segment Barrett's esophagus. GEJ:  37 cm Stomach:  Stomach was empty and distended very well with insufflation. Folds in the proximal stomach were normal. Multiple polyps were noted at gastric body and fundus ranging in size from 3 to 12 mm. these are suspicious for hyperplastic polyps. These were biopsied for histology. Antral mucosa was normal. Pyloric channel was patent. Mucosa at angularis and cardia was normal. Duodenum:  Normal bulbar and post bulbar mucosa.  Therapeutic/Diagnostic Maneuvers Performed:  See above  Complications:  None  Impression: No evidence of peptic ulcer disease or gastritis. Two short  tongues of pink  mucosa at GE junction. Biopsy taken to rule out short segment Barrett's esophagus. Multiple  hyperplastic appearing polyps at gastric body and fundus. Biopsy taken from 4 of these were histologic confirmation.  Recommendations:  Standard instructions given. I will be contacting patient with results of biopsy. Patient advised to keep  symptom diary as to the timing, location of her pain as well as triggers  REHMAN,NAJEEB U  04/15/2011  12:20 PM  CC: Dr. Lilyan Punt, MD, MD & Dr. Bonnetta Barry ref. provider found

## 2011-04-15 NOTE — H&P (Signed)
This is up-to-date to history and physical of 03/29/2011. Patient symptoms have not changed. She has gallstone but this was not felt to be source of her symptoms. She is undergoing EGD to rule out peptic ulcer disease.

## 2011-04-19 ENCOUNTER — Encounter (INDEPENDENT_AMBULATORY_CARE_PROVIDER_SITE_OTHER): Payer: Self-pay | Admitting: *Deleted

## 2011-04-22 ENCOUNTER — Encounter (HOSPITAL_COMMUNITY): Payer: Self-pay | Admitting: Internal Medicine

## 2011-08-10 ENCOUNTER — Encounter

## 2011-09-07 ENCOUNTER — Other Ambulatory Visit: Payer: Self-pay | Admitting: Family Medicine

## 2011-09-07 ENCOUNTER — Ambulatory Visit (HOSPITAL_COMMUNITY)
Admission: RE | Admit: 2011-09-07 | Discharge: 2011-09-07 | Disposition: A | Discharge status: Home / Self Care | Payer: Medicare Other | Source: Ambulatory Visit | Attending: Family Medicine | Admitting: Family Medicine

## 2011-09-07 DIAGNOSIS — R079 Chest pain, unspecified: Secondary | ICD-10-CM | POA: Insufficient documentation

## 2011-09-07 DIAGNOSIS — R0781 Pleurodynia: Secondary | ICD-10-CM

## 2011-09-07 DIAGNOSIS — M940 Chondrocostal junction syndrome [Tietze]: Secondary | ICD-10-CM | POA: Diagnosis not present

## 2011-09-22 ENCOUNTER — Telehealth (INDEPENDENT_AMBULATORY_CARE_PROVIDER_SITE_OTHER): Payer: Self-pay | Admitting: *Deleted

## 2011-09-22 ENCOUNTER — Encounter (INDEPENDENT_AMBULATORY_CARE_PROVIDER_SITE_OTHER): Payer: Self-pay | Admitting: *Deleted

## 2011-09-22 ENCOUNTER — Other Ambulatory Visit (INDEPENDENT_AMBULATORY_CARE_PROVIDER_SITE_OTHER): Payer: Self-pay | Admitting: *Deleted

## 2011-09-22 DIAGNOSIS — Z1211 Encounter for screening for malignant neoplasm of colon: Secondary | ICD-10-CM

## 2011-09-22 NOTE — Telephone Encounter (Signed)
Patient needs movi prep 

## 2011-09-23 MED ORDER — PEG-KCL-NACL-NASULF-NA ASC-C 100 G PO SOLR: 1.0000 | Freq: Once | ORAL | Status: DC

## 2011-09-27 DIAGNOSIS — L821 Other seborrheic keratosis: Secondary | ICD-10-CM | POA: Diagnosis not present

## 2011-09-27 DIAGNOSIS — D235 Other benign neoplasm of skin of trunk: Secondary | ICD-10-CM | POA: Diagnosis not present

## 2011-10-04 ENCOUNTER — Encounter (INDEPENDENT_AMBULATORY_CARE_PROVIDER_SITE_OTHER): Payer: Self-pay

## 2011-10-19 DIAGNOSIS — Z9189 Other specified personal risk factors, not elsewhere classified: Secondary | ICD-10-CM | POA: Diagnosis not present

## 2011-10-19 DIAGNOSIS — Z13820 Encounter for screening for osteoporosis: Secondary | ICD-10-CM | POA: Diagnosis not present

## 2011-10-19 DIAGNOSIS — R87620 Atypical squamous cells of undetermined significance on cytologic smear of vagina (ASC-US): Secondary | ICD-10-CM | POA: Diagnosis not present

## 2011-10-19 DIAGNOSIS — N952 Postmenopausal atrophic vaginitis: Secondary | ICD-10-CM | POA: Diagnosis not present

## 2011-10-19 DIAGNOSIS — Z1231 Encounter for screening mammogram for malignant neoplasm of breast: Secondary | ICD-10-CM | POA: Diagnosis not present

## 2011-12-14 ENCOUNTER — Ambulatory Visit: Admit: 2011-12-14 | Payer: Self-pay | Admitting: Internal Medicine

## 2011-12-14 SURGERY — COLONOSCOPY
Anesthesia: Moderate Sedation

## 2012-03-14 DIAGNOSIS — Z23 Encounter for immunization: Secondary | ICD-10-CM | POA: Diagnosis not present

## 2012-04-11 DIAGNOSIS — E785 Hyperlipidemia, unspecified: Secondary | ICD-10-CM | POA: Diagnosis not present

## 2012-04-11 DIAGNOSIS — E782 Mixed hyperlipidemia: Secondary | ICD-10-CM | POA: Diagnosis not present

## 2012-04-11 DIAGNOSIS — E162 Hypoglycemia, unspecified: Secondary | ICD-10-CM | POA: Diagnosis not present

## 2012-04-11 DIAGNOSIS — R5381 Other malaise: Secondary | ICD-10-CM | POA: Diagnosis not present

## 2012-04-11 DIAGNOSIS — Z79899 Other long term (current) drug therapy: Secondary | ICD-10-CM | POA: Diagnosis not present

## 2012-04-11 DIAGNOSIS — I10 Essential (primary) hypertension: Secondary | ICD-10-CM | POA: Diagnosis not present

## 2012-04-11 DIAGNOSIS — R7301 Impaired fasting glucose: Secondary | ICD-10-CM | POA: Diagnosis not present

## 2012-04-30 DIAGNOSIS — Z124 Encounter for screening for malignant neoplasm of cervix: Secondary | ICD-10-CM | POA: Diagnosis not present

## 2012-04-30 DIAGNOSIS — Z9189 Other specified personal risk factors, not elsewhere classified: Secondary | ICD-10-CM | POA: Diagnosis not present

## 2012-04-30 DIAGNOSIS — Z130 Encounter for screening for diseases of the blood and blood-forming organs and certain disorders involving the immune mechanism: Secondary | ICD-10-CM | POA: Diagnosis not present

## 2012-04-30 DIAGNOSIS — Z1212 Encounter for screening for malignant neoplasm of rectum: Secondary | ICD-10-CM | POA: Diagnosis not present

## 2012-06-11 ENCOUNTER — Encounter

## 2012-07-18 DIAGNOSIS — H52229 Regular astigmatism, unspecified eye: Secondary | ICD-10-CM | POA: Diagnosis not present

## 2012-07-18 DIAGNOSIS — H524 Presbyopia: Secondary | ICD-10-CM | POA: Diagnosis not present

## 2012-07-18 DIAGNOSIS — H5200 Hypermetropia, unspecified eye: Secondary | ICD-10-CM | POA: Diagnosis not present

## 2012-07-18 DIAGNOSIS — H35379 Puckering of macula, unspecified eye: Secondary | ICD-10-CM | POA: Diagnosis not present

## 2012-07-24 ENCOUNTER — Telehealth (INDEPENDENT_AMBULATORY_CARE_PROVIDER_SITE_OTHER): Payer: Self-pay | Admitting: *Deleted

## 2012-07-24 ENCOUNTER — Other Ambulatory Visit (INDEPENDENT_AMBULATORY_CARE_PROVIDER_SITE_OTHER): Payer: Self-pay | Admitting: *Deleted

## 2012-07-24 DIAGNOSIS — Z1211 Encounter for screening for malignant neoplasm of colon: Secondary | ICD-10-CM

## 2012-07-24 MED ORDER — PEG-KCL-NACL-NASULF-NA ASC-C 100 G PO SOLR: 1.0000 | Freq: Once | ORAL | Status: DC

## 2012-07-24 NOTE — Telephone Encounter (Signed)
Patient needs movi prep 

## 2012-07-31 DIAGNOSIS — H2510 Age-related nuclear cataract, unspecified eye: Secondary | ICD-10-CM | POA: Diagnosis not present

## 2012-08-01 ENCOUNTER — Telehealth (INDEPENDENT_AMBULATORY_CARE_PROVIDER_SITE_OTHER): Payer: Self-pay | Admitting: *Deleted

## 2012-08-01 NOTE — Telephone Encounter (Signed)
  Procedure: tcs  Reason/Indication:  screening  Has patient had this procedure before?  Yes, 2003 (EPIC)  If so, when, by whom and where?    Is there a family history of colon cancer?  no  Who?  What age when diagnosed?    Is patient diabetic?   no      Does patient have prosthetic heart valve?  no  Do you have a pacemaker?  no  Has patient had joint replacement within last 12 months?  no  Is patient on Coumadin, Plavix and/or Aspirin? yes  Medications: asa 81 mg daily, elestrin 0.06% topical gel, pantoprazole 40 mg daily, fish oil 1000 mg daily, loratadine 10 mg daily, biotin 5000 mg daily, torsemide 20 mg daily, pravastatin 40 mg daily, losartan potassium 50 mg daily  Allergies: nkda  Medication Adjustment: asa 2 days  Procedure date & time: 08/30/12 at 830

## 2012-08-01 NOTE — Telephone Encounter (Signed)
agree

## 2012-08-09 DIAGNOSIS — H269 Unspecified cataract: Secondary | ICD-10-CM | POA: Diagnosis not present

## 2012-08-09 DIAGNOSIS — H2510 Age-related nuclear cataract, unspecified eye: Secondary | ICD-10-CM | POA: Diagnosis not present

## 2012-08-17 ENCOUNTER — Encounter (HOSPITAL_COMMUNITY): Payer: Self-pay | Admitting: Pharmacy Technician

## 2012-08-29 MED ORDER — SODIUM CHLORIDE 0.45 % IV SOLN: INTRAVENOUS | Status: DC

## 2012-08-30 ENCOUNTER — Encounter (HOSPITAL_COMMUNITY)
Admission: RE | Disposition: A | Discharge status: Home / Self Care | Payer: Self-pay | Source: Ambulatory Visit | Attending: Internal Medicine

## 2012-08-30 ENCOUNTER — Ambulatory Visit (HOSPITAL_COMMUNITY)
Admission: RE | Admit: 2012-08-30 | Discharge: 2012-08-30 | Disposition: A | Discharge status: Home / Self Care | Payer: Medicare Other | Source: Ambulatory Visit | Attending: Internal Medicine | Admitting: Internal Medicine

## 2012-08-30 ENCOUNTER — Encounter (HOSPITAL_COMMUNITY): Payer: Self-pay

## 2012-08-30 DIAGNOSIS — K5900 Constipation, unspecified: Secondary | ICD-10-CM | POA: Diagnosis not present

## 2012-08-30 DIAGNOSIS — K6389 Other specified diseases of intestine: Secondary | ICD-10-CM | POA: Diagnosis not present

## 2012-08-30 DIAGNOSIS — Z888 Allergy status to other drugs, medicaments and biological substances status: Secondary | ICD-10-CM | POA: Diagnosis not present

## 2012-08-30 DIAGNOSIS — D126 Benign neoplasm of colon, unspecified: Secondary | ICD-10-CM | POA: Insufficient documentation

## 2012-08-30 DIAGNOSIS — Z7982 Long term (current) use of aspirin: Secondary | ICD-10-CM | POA: Insufficient documentation

## 2012-08-30 DIAGNOSIS — Z1211 Encounter for screening for malignant neoplasm of colon: Secondary | ICD-10-CM

## 2012-08-30 DIAGNOSIS — Z79899 Other long term (current) drug therapy: Secondary | ICD-10-CM | POA: Diagnosis not present

## 2012-08-30 DIAGNOSIS — E7800 Pure hypercholesterolemia, unspecified: Secondary | ICD-10-CM | POA: Diagnosis not present

## 2012-08-30 DIAGNOSIS — K644 Residual hemorrhoidal skin tags: Secondary | ICD-10-CM | POA: Diagnosis not present

## 2012-08-30 DIAGNOSIS — I10 Essential (primary) hypertension: Secondary | ICD-10-CM | POA: Diagnosis not present

## 2012-08-30 HISTORY — PX: COLONOSCOPY: SHX5424

## 2012-08-30 SURGERY — COLONOSCOPY
Anesthesia: Moderate Sedation

## 2012-08-30 MED ORDER — POLYETHYLENE GLYCOL 3350 17 G PO PACK: 17.0000 g | PACK | Freq: Every day | ORAL | Status: DC

## 2012-08-30 MED ORDER — MIDAZOLAM HCL 5 MG/5ML IJ SOLN
INTRAMUSCULAR | Status: AC
  Filled 2012-08-30: qty 5

## 2012-08-30 MED ORDER — STERILE WATER FOR IRRIGATION IR SOLN
Status: DC | PRN
  Administered 2012-08-30: 08:00:00

## 2012-08-30 MED ORDER — MEPERIDINE HCL 50 MG/ML IJ SOLN
INTRAMUSCULAR | Status: AC
  Filled 2012-08-30: qty 1

## 2012-08-30 MED ORDER — MIDAZOLAM HCL 5 MG/5ML IJ SOLN
INTRAMUSCULAR | Status: DC | PRN
  Administered 2012-08-30 (×6): 2 mg via INTRAVENOUS

## 2012-08-30 MED ORDER — SODIUM CHLORIDE 0.9 % IV SOLN
INTRAVENOUS | Status: DC
  Administered 2012-08-30: 08:00:00 via INTRAVENOUS

## 2012-08-30 MED ORDER — MEPERIDINE HCL 50 MG/ML IJ SOLN
INTRAMUSCULAR | Status: DC | PRN
  Administered 2012-08-30 (×2): 25 mg via INTRAVENOUS

## 2012-08-30 MED ORDER — MIDAZOLAM HCL 5 MG/5ML IJ SOLN
INTRAMUSCULAR | Status: AC
  Filled 2012-08-30: qty 10

## 2012-08-30 NOTE — Op Note (Signed)
COLONOSCOPY PROCEDURE REPORT  PATIENT:  Terri Douglas  MR#:  [id number] Birthdate:  January 10, 1946, 67 y.o., female Endoscopist:  Dr. Malissa Hippo, MD Referred By:  Dr. Lilyan Punt, MD Procedure Date: 08/30/2012  Procedure:   Colonoscopy  Indications: Patient is 67 year old Caucasian female was undergoing average risk screening colonoscopy. She has chronic constipation and uses laxatives on as-needed basis. Last colonoscopy was in 2003.  Informed Consent:  The procedure and risks were reviewed with the patient and informed consent was obtained.  Medications:  Demerol 50 mg IV Versed 12 mg IV  Description of procedure:  After a digital rectal exam was performed, that colonoscope was advanced from the anus through the rectum and colon to the area of the cecum, ileocecal valve and appendiceal orifice. The cecum was deeply intubated. These structures were well-seen and photographed for the record. From the level of the cecum and ileocecal valve, the scope was slowly and cautiously withdrawn. The mucosal surfaces were carefully surveyed utilizing scope tip to flexion to facilitate fold flattening as needed. The scope was pulled down into the rectum where a thorough exam including retroflexion was performed.  Findings:   Prep excellent. Noncompliant distal sigmoid colon. Two small polyps ablated via cold biopsy from the cecum along with third one at ascending colon. These polyps are submitted together. Normal rectal mucosa. Small hemorrhoids below the dentate line along with two anal papillae.   Therapeutic/Diagnostic Maneuvers Performed:  See above  Complications:  None  Cecal Withdrawal Time:  10 minutes  Impression:  Examination performed to cecum. Three small polyps ablated via cold biopsy and submitted together(two at cecum and one at ascending colon). Small external hemorrhoids and two anal papillae.  Recommendations:  Standard instructions given. High fiber diet plus  Metamucil daily. Polyethylene glycol 17 g by mouth daily. I will contact patient with biopsy results and further recommendations.  Terri Douglas U  08/30/2012 9:08 AM  CC: Dr. Lilyan Punt, MD & Dr. Bonnetta Barry ref. provider found

## 2012-08-30 NOTE — H&P (Signed)
Terri Douglas is an 67 y.o. female.   Chief Complaint: Patient is here for colonoscopy. HPI: Patient is 67 year old Caucasian femalemale who is here for  screening colonoscopy. She denies abdominal pain rectal bleeding. She has chronic constipation uses Imodium on when necessary basis. MiraLax does not work well in the past. Last colonoscopy was in 2003. Family history is negative for colorectal carcinoma.  Past Medical History  Diagnosis Date  . Hypertension   . High cholesterol   . Hypertension   . PONV (postoperative nausea and vomiting)     Past Surgical History  Procedure Laterality Date  . Neck surgery  x3  . Vaginal hysterectomyh    . Dilation and curettage of uterus    . Back surgery  1992  . Eye surgery      cataract removal  . Esophagogastroduodenoscopy  04/15/2011    Procedure: ESOPHAGOGASTRODUODENOSCOPY (EGD);  Surgeon: Malissa Hippo, MD;  Location: [facility];  Service: Endoscopy;  Laterality: N/A;  11:30    Family History  Problem Relation Age of Onset  . Anesthesia problems Neg Hx   . Hypotension Neg Hx   . Malignant hyperthermia Neg Hx   . Pseudochol deficiency Neg Hx    Social History:  reports that she has never smoked. She does not have any smokeless tobacco history on file. She reports that she does not drink alcohol or use illicit drugs.  Allergies:  Allergies  Allergen Reactions  . Phenobarbital     Causes blurred vision    Medications Prior to Admission  Medication Sig Dispense Refill  . ALPRAZolam (XANAX) 1 MG tablet Take 1 mg by mouth at bedtime.       Marland Kitchen aspirin 81 MG tablet Take 81 mg by mouth daily.        . Biotin 10 MG TABS Take by mouth.        . cholecalciferol (VITAMIN D) 1000 UNITS tablet Take 1,000 Units by mouth daily.      . Estradiol (ELESTRIN) 0.52 MG/0.87 GM (0.06%) GEL Apply 1 application topically daily.      . fish oil-omega-3 fatty acids 1000 MG capsule Take 2 g by mouth daily.        Marland Kitchen losartan (COZAAR) 50 MG tablet Take 50  mg by mouth daily.        . Multiple Vitamin (MULTIVITAMIN WITH MINERALS) TABS Take 1 tablet by mouth daily.      . pantoprazole (PROTONIX) 40 MG tablet Take 40 mg by mouth daily.        . pravastatin (PRAVACHOL) 40 MG tablet Take 40 mg by mouth daily.      . psyllium (METAMUCIL) 58.6 % packet Take 1 packet by mouth daily.        Marland Kitchen torsemide (DEMADEX) 20 MG tablet Take 20 mg by mouth daily.        . peg 3350 powder (MOVIPREP) 100 G SOLR Take 1 kit (100 g total) by mouth once.  1 kit  0    No results found for this or any previous visit (from the past 48 hour(s)). No results found.  ROS  Blood pressure 141/103, pulse 67, temperature 97.6 F (36.4 C), temperature source Oral, resp. rate 22, height 5\' 2"  (1.575 m), weight 140 lb (63.504 kg), SpO2 98.00%. Physical Exam  Constitutional: She appears well-developed and well-nourished.  HENT:  Mouth/Throat: Oropharynx is clear and moist.  Eyes: Conjunctivae are normal.  Neck: No thyromegaly present.  Cardiovascular: Normal rate, regular  rhythm and normal heart sounds.   No murmur heard. Respiratory: Effort normal and breath sounds normal.  GI: Soft. She exhibits no mass. There is no tenderness.  Musculoskeletal: She exhibits no edema.  Lymphadenopathy:    She has no cervical adenopathy.  Neurological: She is alert.  Skin: Skin is warm and dry.     Assessment/Plan Average risk screening colonoscopy. Chronic constipation.  Terri Douglas U 08/30/2012, 8:22 AM

## 2012-09-03 ENCOUNTER — Encounter (HOSPITAL_COMMUNITY): Payer: Self-pay | Admitting: Internal Medicine

## 2012-09-10 ENCOUNTER — Encounter (INDEPENDENT_AMBULATORY_CARE_PROVIDER_SITE_OTHER): Payer: Self-pay | Admitting: *Deleted

## 2012-09-12 DIAGNOSIS — Z0289 Encounter for other administrative examinations: Secondary | ICD-10-CM

## 2012-10-01 ENCOUNTER — Telehealth: Payer: Self-pay | Admitting: Family Medicine

## 2012-10-01 NOTE — Telephone Encounter (Signed)
The patient was seen on 03/29/2010. At that time she complained of right knee pain. At that time the pain had been going on for 3 weeks. She had tenderness in the medial aspect of the knee and she was prescribed Relafen. There is no mention of ongoing knee pain after that visit. If the patient needs a letter stating that she is no longer having knee problems I will be happy to give this.

## 2012-10-01 NOTE — Telephone Encounter (Signed)
Patient received a letter from [facility] that states "Knee Disorder found in [facility]".  Patient states she does not have a knee disorder.  Letter is attached to chart and being put up for review.  See 03/29/10 note.  Is this considered a knee pain?  Please call patient

## 2012-10-02 ENCOUNTER — Encounter: Payer: Self-pay | Admitting: *Deleted

## 2012-10-02 ENCOUNTER — Telehealth: Payer: Self-pay | Admitting: *Deleted

## 2012-10-02 NOTE — Telephone Encounter (Signed)
Pt is inquiring about letter stating she is no longer being treated for Rt knee pain (from 2012) for insurance purposes.

## 2012-10-02 NOTE — Telephone Encounter (Signed)
This letter was dictated last night it is being processed by Alcario Drought  this morningwhen it is printed on letterhead the patient will be called

## 2012-10-04 DEATH — deceased

## 2012-10-22 DIAGNOSIS — Z1231 Encounter for screening mammogram for malignant neoplasm of breast: Secondary | ICD-10-CM | POA: Diagnosis not present

## 2012-12-11 ENCOUNTER — Encounter

## 2012-12-26 ENCOUNTER — Encounter

## 2013-01-15 ENCOUNTER — Encounter

## 2013-01-16 ENCOUNTER — Ambulatory Visit (INDEPENDENT_AMBULATORY_CARE_PROVIDER_SITE_OTHER): Payer: Medicare Other | Admitting: Family Medicine

## 2013-01-16 ENCOUNTER — Encounter: Payer: Self-pay | Admitting: Family Medicine

## 2013-01-16 VITALS — BP 138/84 | Ht 62.0 in

## 2013-01-16 DIAGNOSIS — L0390 Cellulitis, unspecified: Secondary | ICD-10-CM

## 2013-01-16 DIAGNOSIS — L0291 Cutaneous abscess, unspecified: Secondary | ICD-10-CM | POA: Diagnosis not present

## 2013-01-16 MED ORDER — DOXYCYCLINE HYCLATE 100 MG PO TABS: 100.0000 mg | ORAL_TABLET | Freq: Two times a day (BID) | ORAL | Status: DC

## 2013-01-16 NOTE — Patient Instructions (Signed)
Take it easy next few days.  Take all the antibiotics up.   When sitting elevate your foot

## 2013-01-16 NOTE — Progress Notes (Signed)
  Subjective:    Patient ID: Terri Douglas, female    DOB: 1946/02/15, 67 y.o.   MRN: [id number]  HPI  Sever pain, started last night. No recent injuries.  Notes red spot.  Very painful dorsal distal foot a  Some hot and cold sensation, no obvious fever.   Review of Systems ROS otherwise negative    Objective:   Physical Exam  Alert no acute distress. Vitals reviewed. Lungs clear heart regular rate and rhythm. Distal dorsal foot erythematous patch warm to touch exquisitely tender.      Assessment & Plan:  Impression superficial cellulitis plan antibiotics as prescribed. Local measures discussed. Warning signs discussed. WSL

## 2013-01-31 ENCOUNTER — Encounter: Payer: Self-pay | Admitting: Family Medicine

## 2013-01-31 ENCOUNTER — Ambulatory Visit (INDEPENDENT_AMBULATORY_CARE_PROVIDER_SITE_OTHER): Payer: Medicare Other | Admitting: Family Medicine

## 2013-01-31 VITALS — BP 126/70 | Ht 62.0 in | Wt 145.6 lb

## 2013-01-31 DIAGNOSIS — M79671 Pain in right foot: Secondary | ICD-10-CM | POA: Insufficient documentation

## 2013-01-31 DIAGNOSIS — M79609 Pain in unspecified limb: Secondary | ICD-10-CM | POA: Diagnosis not present

## 2013-01-31 DIAGNOSIS — M25571 Pain in right ankle and joints of right foot: Secondary | ICD-10-CM

## 2013-01-31 DIAGNOSIS — M25579 Pain in unspecified ankle and joints of unspecified foot: Secondary | ICD-10-CM

## 2013-01-31 LAB — URIC ACID: Uric Acid, Serum: 8.9 mg/dL — ABNORMAL HIGH (ref 2.4–7.0)

## 2013-01-31 LAB — CBC
HCT: 37.9 % (ref 36.0–46.0)
Hemoglobin: 12.5 g/dL (ref 12.0–15.0)
MCH: 28.7 pg (ref 26.0–34.0)
MCHC: 33 g/dL (ref 30.0–36.0)
MCV: 87.1 fL (ref 78.0–100.0)
Platelets: 382 10*3/uL (ref 150–400)
RBC: 4.35 MIL/uL (ref 3.87–5.11)
RDW: 13.7 % (ref 11.5–15.5)
WBC: 5.6 10*3/uL (ref 4.0–10.5)

## 2013-01-31 LAB — URIC ACID, FLUID

## 2013-01-31 MED ORDER — CEFPROZIL 500 MG PO TABS: 500.0000 mg | ORAL_TABLET | Freq: Two times a day (BID) | ORAL | Status: DC

## 2013-01-31 NOTE — Progress Notes (Signed)
  Subjective:    Patient ID: Terri Douglas, female    DOB: [birth year], 67 y.o.   MRN: [id number]  HPI Pt. Here today for swelling in her right foot. It started over the weekend and got worst. It is painful.  Pt was originally here 8/13 for swelling in her left foot, and Dr. Brett Canales gave her an antibiotic, which took care of the problem. However, now her left foot is swollen.  Patient relates pain discomfort swelling. Redness. Been present for the past several days. PMH benign see previous note family history noncontributory    Review of Systems    no fevers no sweats no shortness of breath. No calf pain. Objective:   Physical Exam Tenderness in the midfoot some redness she also has an area that was a healing blister on the toe. This might have been the source of infection       Assessment & Plan:  Foot pain it is hard to tell right at the moment if this is infection related or possibly I would recommend some lab work. Uric acid CBC sedimentation rate. Cefzil twice a day 10 days cool compresses let us know if not improving over the next few days  Followup for regular health checkup later this fall.

## 2013-02-01 LAB — SEDIMENTATION RATE: Sed Rate: 12 mm/hr (ref 0–22)

## 2013-02-02 ENCOUNTER — Other Ambulatory Visit: Payer: Self-pay | Admitting: Family Medicine

## 2013-02-05 NOTE — Telephone Encounter (Signed)
May have this +3 refills 

## 2013-02-07 ENCOUNTER — Other Ambulatory Visit: Payer: Self-pay | Admitting: *Deleted

## 2013-02-07 ENCOUNTER — Other Ambulatory Visit: Payer: Self-pay | Admitting: Family Medicine

## 2013-02-07 ENCOUNTER — Telehealth: Payer: Self-pay | Admitting: Family Medicine

## 2013-02-07 DIAGNOSIS — M109 Gout, unspecified: Secondary | ICD-10-CM

## 2013-02-07 MED ORDER — COLCHICINE 0.6 MG PO TABS: 0.6000 mg | ORAL_TABLET | Freq: Two times a day (BID) | ORAL | Status: DC

## 2013-02-07 NOTE — Telephone Encounter (Signed)
Patient arrived in the office this afternoon wanting to know the results of her blood work that was completed on 01/31/2013.  States she has phoned our office numerous times this week as well as on Friday February 01, 2013.  Informed me that this was not like Dr. Lorin Picket not to let her know what is going on with her Blood Work.  I did let her know that our office has been extremely busy lately and that I was sure Dr. Lorin Picket would get in touch with her as soon as possible.  Her comment was this is no excuse.

## 2013-02-07 NOTE — Telephone Encounter (Signed)
Dr Lorin Picket spoke with patient and gave her results.

## 2013-03-09 DIAGNOSIS — Z23 Encounter for immunization: Secondary | ICD-10-CM | POA: Diagnosis not present

## 2013-03-11 DIAGNOSIS — M109 Gout, unspecified: Secondary | ICD-10-CM | POA: Diagnosis not present

## 2013-03-11 LAB — URIC ACID: Uric Acid, Serum: 6.7 mg/dL (ref 2.4–7.0)

## 2013-03-13 ENCOUNTER — Encounter: Payer: Self-pay | Admitting: Family Medicine

## 2013-03-15 ENCOUNTER — Telehealth: Payer: Self-pay | Admitting: Family Medicine

## 2013-03-15 NOTE — Telephone Encounter (Signed)
Patient wants the results to her BW-she says she would like them today because she is going out of town.

## 2013-03-18 NOTE — Telephone Encounter (Signed)
Notified patient uric acid is normal, continue healthy diet (rich in veggies/fruits), notify us if problem. Patient verbalized understanding.

## 2013-03-18 NOTE — Telephone Encounter (Signed)
Please call patient (use cell if need be) tell her Uric acid is normal, continue healthy diet (rich in veggies/fruits), notify us if problem.

## 2013-03-18 NOTE — Telephone Encounter (Signed)
Left message on voicemail to return call.

## 2013-04-17 ENCOUNTER — Encounter: Payer: Self-pay | Admitting: Family Medicine

## 2013-04-17 ENCOUNTER — Ambulatory Visit (INDEPENDENT_AMBULATORY_CARE_PROVIDER_SITE_OTHER): Payer: Medicare Other | Admitting: Family Medicine

## 2013-04-17 VITALS — BP 110/66 | Ht 62.0 in | Wt 143.0 lb

## 2013-04-17 DIAGNOSIS — M545 Low back pain, unspecified: Secondary | ICD-10-CM

## 2013-04-17 LAB — POCT URINALYSIS DIPSTICK
Spec Grav, UA: 1.02
pH, UA: 7

## 2013-04-17 MED ORDER — CHLORZOXAZONE 500 MG PO TABS: 500.0000 mg | ORAL_TABLET | Freq: Four times a day (QID) | ORAL | Status: DC | PRN

## 2013-04-17 MED ORDER — MELOXICAM 15 MG PO TABS: 15.0000 mg | ORAL_TABLET | Freq: Every day | ORAL | Status: DC

## 2013-04-17 NOTE — Progress Notes (Signed)
  Subjective:    Patient ID: Terri Douglas, female    DOB: [birth year], 67 y.o.   MRN: [id number]  Back Pain This is a new problem. The current episode started in the past 7 days. Quality: sharp. The symptoms are aggravated by bending and sitting. She has tried heat for the symptoms.   Denies any injury. This been going on for the past week worse over the past few days  Review of Systems  Musculoskeletal: Positive for back pain.   No dysuria denies any discomfort down the legs    Objective:   Physical Exam Lower back moderate tenderness especially on the left side negative straight leg raise slight increased pain in the lower back with straight leg on the left side abdomen is soft lungs clear heart regular  Urinalysis with occasional WBC no sign UTI     Assessment & Plan:  Lumbar pain-anti-inflammatory, muscle relaxer, stretching exercises, if not better in one week's time recommend x-ray, no need for MRI currently. Call us if ongoing trouble  Has had some gout flareups I discussed the importance of a low protein diet also discuss allopurinol or Uloric but currently patient does not want to be on medication she will think about it.

## 2013-04-29 ENCOUNTER — Encounter: Payer: Self-pay | Admitting: Family Medicine

## 2013-04-29 ENCOUNTER — Ambulatory Visit (INDEPENDENT_AMBULATORY_CARE_PROVIDER_SITE_OTHER): Payer: Medicare Other | Admitting: Family Medicine

## 2013-04-29 VITALS — BP 100/62 | Temp 97.9°F | Ht 62.0 in | Wt 143.0 lb

## 2013-04-29 DIAGNOSIS — S8010XA Contusion of unspecified lower leg, initial encounter: Secondary | ICD-10-CM

## 2013-04-29 DIAGNOSIS — S8011XA Contusion of right lower leg, initial encounter: Secondary | ICD-10-CM

## 2013-04-29 NOTE — Progress Notes (Signed)
  Subjective:    Patient ID: Terri Douglas, female    DOB: [birth year], 67 y.o.   MRN: [id number]  Leg Pain  The incident occurred 3 to 5 days ago. The incident occurred at home. The injury mechanism was a fall. The pain is present in the right leg and right ankle. The quality of the pain is described as aching. The pain is at a severity of 8/10. The pain is moderate. The pain has been intermittent since onset. She reports no foreign bodies present. The symptoms are aggravated by weight bearing. She has tried non-weight bearing for the symptoms. The treatment provided mild relief.   Came back from [place] missed step fell forward hitting r side of leg, used ice. Noticed rlower leg pain. Took aspirin. Using hot saoks. Noticing back pain more so since the fall. Pain across the lower back.  PMH back pain No LOC  Review of Systems    pain with walking. Objective:   Physical Exam Lower back has mild tenderness on the lower mid back to the right side negative straight leg raise severe bruising in right lower leg no deformity in the bone ankle ligaments stable some swelling noted no calf tenderness no sign of DVT. I believe this is just a deep bruise.       Assessment & Plan:  Deep contusion to the left leg warm compresses as necessary ice for any pain may resume normal activities followup if ongoing troubles, she R. he has anti-inflammatory. Call us if any problem

## 2013-05-06 ENCOUNTER — Other Ambulatory Visit: Payer: Self-pay | Admitting: Family Medicine

## 2013-05-06 ENCOUNTER — Ambulatory Visit: Payer: Medicare Other | Admitting: Physician Assistant

## 2013-05-08 DIAGNOSIS — Z9189 Other specified personal risk factors, not elsewhere classified: Secondary | ICD-10-CM | POA: Diagnosis not present

## 2013-05-08 DIAGNOSIS — Z124 Encounter for screening for malignant neoplasm of cervix: Secondary | ICD-10-CM | POA: Diagnosis not present

## 2013-05-14 ENCOUNTER — Other Ambulatory Visit: Payer: Self-pay | Admitting: Family Medicine

## 2013-06-11 ENCOUNTER — Encounter: Payer: Self-pay | Admitting: Family Medicine

## 2013-06-11 ENCOUNTER — Ambulatory Visit (INDEPENDENT_AMBULATORY_CARE_PROVIDER_SITE_OTHER): Payer: Medicare Other | Admitting: Family Medicine

## 2013-06-11 VITALS — BP 132/80 | Temp 98.8°F | Ht 62.0 in | Wt 144.8 lb

## 2013-06-11 DIAGNOSIS — J069 Acute upper respiratory infection, unspecified: Secondary | ICD-10-CM | POA: Diagnosis not present

## 2013-06-11 DIAGNOSIS — J019 Acute sinusitis, unspecified: Secondary | ICD-10-CM

## 2013-06-11 MED ORDER — AZITHROMYCIN 250 MG PO TABS: ORAL_TABLET | ORAL | Status: DC

## 2013-06-11 NOTE — Progress Notes (Signed)
   Subjective:    Patient ID: Terri Douglas, female    DOB: [birth year], 68 y.o.   MRN: [id number]  Sore Throat  This is a new problem. The current episode started yesterday. Associated symptoms include ear pain. Associated symptoms comments: Headache, runny  nose. Treatments tried: salt water gargle. The treatment provided no relief.   PMH benign   Review of Systems  HENT: Positive for ear pain.        Objective:   Physical Exam  Lungs clear heart regular throat normal hoarseness noted neck no masses eardrums normal      Assessment & Plan:  Pharyngitis/upper rest revealed [place] with secondary sinus should gradually get better antibiotics prescribed if not improving over the course of next 5-7 days may need second round of antibiotics.

## 2013-07-11 LAB — CBC WITH AUTOMATED DIFF
ABS. BASOPHILS: 0 10*3/uL (ref 0.0–0.06)
ABS. EOSINOPHILS: 0.2 10*3/uL (ref 0.0–0.4)
ABS. LYMPHOCYTES: 1.7 10*3/uL (ref 0.9–3.6)
ABS. MONOCYTES: 0.5 10*3/uL (ref 0.05–1.2)
ABS. NEUTROPHILS: 2.7 10*3/uL (ref 1.8–8.0)
BASOPHILS: 1 % (ref 0–2)
EOSINOPHILS: 5 % (ref 0–5)
HCT: 43.8 % (ref 35.0–45.0)
HGB: 14.3 g/dL (ref 12.0–16.0)
LYMPHOCYTES: 33 % (ref 21–52)
MCH: 30.3 PG (ref 24.0–34.0)
MCHC: 32.6 g/dL (ref 31.0–37.0)
MCV: 92.8 FL (ref 74.0–97.0)
MONOCYTES: 9 % (ref 3–10)
MPV: 11.4 FL (ref 9.2–11.8)
NEUTROPHILS: 52 % (ref 40–73)
PLATELET: 216 10*3/uL (ref 135–420)
RBC: 4.72 M/uL (ref 4.20–5.30)
RDW: 12.9 % (ref 11.6–14.5)
WBC: 5.1 10*3/uL (ref 4.6–13.2)

## 2013-07-11 LAB — METABOLIC PANEL, BASIC
Anion gap: 6 mmol/L (ref 3.0–18)
BUN/Creatinine ratio: 26 — ABNORMAL HIGH (ref 12–20)
BUN: 21 MG/DL — ABNORMAL HIGH (ref 7.0–18)
CO2: 30 mmol/L (ref 21–32)
Calcium: 8.7 MG/DL (ref 8.5–10.1)
Chloride: 107 mmol/L (ref 100–108)
Creatinine: 0.81 MG/DL (ref 0.6–1.3)
GFR est AA: >60 mL/min/{1.73_m2} (ref >60)
GFR est non-AA: >60 mL/min/{1.73_m2} (ref >60)
Glucose: 99 mg/dL (ref 74–99)
Potassium: 3.9 mmol/L (ref 3.5–5.5)
Sodium: 143 mmol/L (ref 136–145)

## 2013-07-11 LAB — LIPID PANEL
CHOL/HDL Ratio: 2.3 (ref 0–5.0)
Cholesterol, total: 172 MG/DL (ref <200)
HDL Cholesterol: 74 MG/DL — ABNORMAL HIGH (ref 40–60)
LDL, calculated: 79.6 MG/DL (ref 0–100)
Triglyceride: 92 MG/DL (ref <150)
VLDL, calculated: 18.4 MG/DL

## 2013-07-11 LAB — TSH 3RD GENERATION: TSH: 0.59 u[IU]/mL (ref 0.36–3.74)

## 2013-07-11 LAB — HEPATIC FUNCTION PANEL
A-G Ratio: 1.2 (ref 0.8–1.7)
ALT (SGPT): 27 U/L (ref 13–56)
AST (SGOT): 22 U/L (ref 15–37)
Albumin: 4.1 g/dL (ref 3.4–5.0)
Alk. phosphatase: 91 U/L (ref 45–117)
Bilirubin, direct: 0.1 MG/DL (ref 0.0–0.2)
Bilirubin, total: 0.5 MG/DL (ref 0.2–1.0)
Globulin: 3.3 g/dL (ref 2.0–4.0)
Protein, total: 7.4 g/dL (ref 6.4–8.2)

## 2013-07-11 LAB — T4, FREE: T4, Free: 1.3 NG/DL (ref 0.7–1.5)

## 2013-07-11 LAB — VITAMIN D, 25 HYDROXY: Vitamin D 25-Hydroxy: 20.8 ng/mL — ABNORMAL LOW (ref 30–100)

## 2013-08-07 ENCOUNTER — Other Ambulatory Visit: Payer: Self-pay | Admitting: Family Medicine

## 2013-08-07 NOTE — Telephone Encounter (Signed)
This +3 refills 

## 2013-08-08 ENCOUNTER — Other Ambulatory Visit: Payer: Self-pay | Admitting: *Deleted

## 2013-08-09 ENCOUNTER — Other Ambulatory Visit: Payer: Self-pay | Admitting: Family Medicine

## 2013-09-05 ENCOUNTER — Inpatient Hospital Stay: Payer: BLUE CROSS/BLUE SHIELD

## 2013-09-05 MED ORDER — FAMOTIDINE 20 MG TAB
20 mg | Freq: Once | ORAL | Status: DC
Start: 2013-09-05 — End: 2013-09-05

## 2013-09-05 MED ORDER — FAMOTIDINE (PF) 20 MG/2 ML IV
20 mg/2 mL | Freq: Once | INTRAVENOUS | Status: DC
Start: 2013-09-05 — End: 2013-09-05

## 2013-09-05 MED ORDER — ATROPINE 0.1 MG/ML SYRINGE
0.1 mg/mL | Freq: Once | INTRAMUSCULAR | Status: DC | PRN
Start: 2013-09-05 — End: 2013-09-05

## 2013-09-05 MED ORDER — FLUMAZENIL 0.1 MG/ML IV SOLN
0.1 mg/mL | INTRAVENOUS | Status: DC | PRN
Start: 2013-09-05 — End: 2013-09-05

## 2013-09-05 MED ORDER — EPINEPHRINE 0.1 MG/ML SYRINGE
0.1 mg/mL | Freq: Once | INTRAMUSCULAR | Status: DC | PRN
Start: 2013-09-05 — End: 2013-09-05

## 2013-09-05 MED ORDER — NALOXONE 0.4 MG/ML INJECTION
0.4 mg/mL | INTRAMUSCULAR | Status: DC | PRN
Start: 2013-09-05 — End: 2013-09-05

## 2013-09-05 MED ADMIN — lactated ringers infusion: INTRAVENOUS | @ 15:00:00 | NDC 00409795309

## 2013-09-05 MED FILL — BD POSIFLUSH NORMAL SALINE 0.9 % INJECTION SYRINGE: INTRAMUSCULAR | Qty: 10

## 2013-09-05 MED FILL — LACTATED RINGERS IV: INTRAVENOUS | Qty: 1000

## 2013-09-05 NOTE — H&P (Signed)
[facility] [place]   [email or web address]      Impression:   1.CRCS      Plan:     1. Colo with MAC      Chief Complaint: CRCS      HPI:  Krystal Hood is a 68 y.o. female who is being seen on consult for CRCS. Last colo was 10 yrs ago,    PMH:   Past Medical History   Diagnosis Date   ??? Hypertension    ??? Thyroid disease      hypothyroidism   ??? Unspecified sleep apnea      Doesn't use CPAP       PSH:   Past Surgical History   Procedure Laterality Date   ??? Hx tubal ligation     ??? Hx tonsil and adenoidectomy     ??? Hx knee arthroscopy Right    ??? Hx other surgical       Fatty removed from left ankle       Social HX:   History     Social History   ??? Marital Status: MARRIED     Spouse Name: N/A     Number of Children: N/A   ??? Years of Education: N/A     Occupational History   ??? Not on file.     Social History Main Topics   ??? Smoking status: Former Smoker   ??? Smokeless tobacco: Never Used   ??? Alcohol Use: No   ??? Drug Use: No   ??? Sexual Activity: Not on file     Other Topics Concern   ??? Not on file     Social History Narrative       FHX:   Family History   Problem Relation Age of Onset   ??? Cancer Mother 39     lymphoma       Allergy:   Allergies   Allergen Reactions   ??? Demerol [Meperidine] Nausea and Vomiting   ??? Dilaudid [Hydromorphone] Nausea and Vomiting       Home Medications:     Prescriptions prior to admission   Medication Sig   ??? NIFEdipine ER (ADALAT CC) 90 mg ER tablet Take 90 mg by mouth nightly. Indications: HYPERTENSION   ??? levothyroxine (SYNTHROID) 125 mcg tablet Take  by mouth nightly. Indications: HYPOTHYROIDISM   ??? rosuvastatin (CRESTOR) 10 mg tablet Take 10 mg by mouth nightly. Indications: HYPERCHOLESTEROLEMIA   ??? multivitamin (ONE A DAY) tablet Take 1 Tab by mouth daily.   ??? CALCIUM CARBONATE (CALCIUM 600 PO) Take  by mouth daily.   ??? CHOLECALCIFEROL, VITAMIN D3, (VITAMIN D3 PO) Take  by mouth daily.   ??? BIOTIN PO Take  by mouth daily.       Review of Systems:      Constitutional: No fevers, chills, weight loss, fatigue.   Skin: No rashes, pruritis, jaundice, ulcerations, erythema.   HENT: No headaches, nosebleeds, sinus pressure, rhinorrhea, sore throat.   Eyes: No visual changes, blurred vision, eye pain, photophobia, jaundice.   Cardiovascular: No chest pain, heart palpitations.   Respiratory: No cough, SOB, wheezing, chest discomfort, orthopnea.   Gastrointestinal: No GI sx's   Genitourinary: No dysuria, bleeding, discharge, pyuria.   Musculoskeletal: No weakness, arthralgias, wasting.   Endo: No sweats.   Heme: No bruising, easy bleeding.   Allergies: As noted.   Neurological: Cranial nerves intact.  Alert and oriented. Gait not assessed.   Psychiatric:  No anxiety, depression, hallucinations.  Ht 5' 5.5" (1.664 m)    Wt 77.111 kg (170 lb)    BMI 27.85 kg/m2       Physical Assessment:     constitutional: appearance: well developed, well nourished, normal habitus, no deformities, in no acute distress.   skin: inspection: no rashes, ulcers, icterus or other lesions; no clubbing or telangiectasias. palpation: no induration or subcutaneos nodules.   eyes: inspection: normal conjunctivae and lids; no jaundice pupils: symmetrical, normoreactive to light, normal accommodation and size.   ENMT: mouth: normal oral mucosa,lips and gums; good dentition. oropharynx: normal tongue, hard and soft palate; posterior pharynx without erithema, exudate or lesions.   neck: thyroid: normal size, consistency and position; no masses or tenderness.   respiratory: effort: normal chest excursion; no intercostal retraction or accessory muscle use.   cardiovascular: abdominal aorta: normal size and position; no bruits. palpation: PMI of normal size and position; normal rhythm; no thrill or murmurs.   abdominal: abdomen: normal consistency; no tenderness or masses. hernias: no hernias appreciated. liver: normal size and consistency. spleen: not palpable.   rectal: hemoccult/guaiac:  not performed.       Ron Agee, MD, M.D.   [facility] [place], [place]  Pager [phone]  [email or web address]

## 2013-09-05 NOTE — Progress Notes (Signed)
Post-Anesthesia Evaluation & Assessment    Visit Vitals   Item Reading   ??? BP 115/73   ??? Pulse 61   ??? Temp 98.3 ??F (36.8 ??C)   ??? Resp 14   ??? Ht 5' 5.5" (1.664 m)   ??? Wt 77.111 kg (170 lb)   ??? BMI 27.85 kg/m2   ??? SpO2 99%       Nausea/Vomiting: no nausea and no vomiting    Post-operative hydration adequate.    Pain score (VAS): 0    Mental status & Level of consciousness: alert and oriented x 4    Neurological status: moves all extremities, sensation grossly intact    Pulmonary status: airway patent, no supplemental oxygen required    Complications related to anesthesia: none    Patient has met all discharge requirements.    Additional comments:        Lemar Livings, MD

## 2013-09-05 NOTE — Procedures (Signed)
[facility] [place] [address]      Brief Procedure Note    Krystal Hood  [birth year]  [id number]    Date of Procedure: 09/05/2013    Preoperative diagnosis: V76.51,  Colon Cancer Screening    Postoperative diagnosis: Diverticulosis    Procedure: Procedure(s):  COLONOSCOPY    Operator:  Dr. Ron Agee, MD    Assistant(s): Endoscopy Technician-1: Donald Pore  Endoscopy RN-1: Berlin Hun, RN    JOI:NOMV    Specimens: * No specimens in log *    Findings: See printed and scanned procedure note    Complications: None    Dr. Ron Agee, MD  09/05/2013  11:50 AM

## 2013-09-05 NOTE — Procedures (Signed)
Procedures  by Joycelyn Schmid, MD at 09/05/13 1150                Author: Joycelyn Schmid, MD  Service: Gastroenterology  Author Type: Physician       Filed: 09/05/13 1151  Date of Service: 09/05/13 1150  Status: Signed          Editor: Joycelyn Schmid, MD (Physician)                                [facility] [place] [address]         Brief Procedure Note      Krystal Hood   10/16/1945   [id number]      Date of Procedure: 09/05/2013      Preoperative diagnosis: V76.51,  Colon Cancer Screening      Postoperative diagnosis: Diverticulosis      Procedure: Procedure(s):   COLONOSCOPY      Operator:  Dr. Ron Agee, MD      Assistant(s): Endoscopy Technician-1: Donald Pore   Endoscopy RN-1: Berlin Hun, RN      XBD:ZHGD      Specimens: * No specimens in log *      Findings: See printed and scanned procedure note      Complications: None      Dr. Ron Agee, MD   09/05/2013  11:50 AM

## 2013-09-09 MED FILL — DIPRIVAN 10 MG/ML INTRAVENOUS EMULSION: 10 mg/mL | INTRAVENOUS | Qty: 20

## 2013-09-09 MED FILL — LACTATED RINGERS IV: INTRAVENOUS | Qty: 1000

## 2013-09-18 ENCOUNTER — Ambulatory Visit (INDEPENDENT_AMBULATORY_CARE_PROVIDER_SITE_OTHER): Payer: Medicare Other | Admitting: Family Medicine

## 2013-09-18 ENCOUNTER — Encounter: Payer: Self-pay | Admitting: Family Medicine

## 2013-09-18 VITALS — BP 118/70 | Temp 98.7°F | Ht 62.0 in | Wt 146.0 lb

## 2013-09-18 DIAGNOSIS — R5383 Other fatigue: Secondary | ICD-10-CM

## 2013-09-18 DIAGNOSIS — I10 Essential (primary) hypertension: Secondary | ICD-10-CM

## 2013-09-18 DIAGNOSIS — J019 Acute sinusitis, unspecified: Secondary | ICD-10-CM

## 2013-09-18 DIAGNOSIS — E7800 Pure hypercholesterolemia, unspecified: Secondary | ICD-10-CM | POA: Diagnosis not present

## 2013-09-18 DIAGNOSIS — R5381 Other malaise: Secondary | ICD-10-CM | POA: Diagnosis not present

## 2013-09-18 DIAGNOSIS — J309 Allergic rhinitis, unspecified: Secondary | ICD-10-CM

## 2013-09-18 DIAGNOSIS — M109 Gout, unspecified: Secondary | ICD-10-CM

## 2013-09-18 MED ORDER — BENZONATATE 100 MG PO CAPS: 200.0000 mg | ORAL_CAPSULE | Freq: Four times a day (QID) | ORAL | Status: DC | PRN

## 2013-09-18 MED ORDER — CEFPROZIL 500 MG PO TABS: 500.0000 mg | ORAL_TABLET | Freq: Two times a day (BID) | ORAL | Status: DC

## 2013-09-18 NOTE — Progress Notes (Signed)
   Subjective:    Patient ID: Terri Douglas, female    DOB: Jun 23, 1945, 68 y.o.   MRN: [id number]  Cough This is a new problem. The current episode started in the past 7 days. The cough is non-productive. Associated symptoms include a sore throat. She has tried OTC cough suppressant (salt water, zpack) for the symptoms.   Patient has a history of hypertension hyperlipidemia   Review of Systems  HENT: Positive for sore throat.   Respiratory: Positive for cough.        Objective:   Physical Exam Lungs are clear hearts regular pulse normal extremities no edema hoarseness noted head congestion noted       Assessment & Plan:  Probable allergies with upper respiratory illness possible sinusitis causing postnasal drainage and hoarseness antibiotics prescribed allergy medicines recommended followup if progressive troubles fevers or worse

## 2013-09-21 ENCOUNTER — Other Ambulatory Visit: Payer: Self-pay | Admitting: Family Medicine

## 2013-09-23 ENCOUNTER — Telehealth: Payer: Self-pay | Admitting: Family Medicine

## 2013-09-23 DIAGNOSIS — M109 Gout, unspecified: Secondary | ICD-10-CM

## 2013-09-23 NOTE — Telephone Encounter (Signed)
Nurses please call the patient. I believe what is being asked is to add the uric acid to the lab work if so that would be fine with the diagnosis of gout

## 2013-09-23 NOTE — Telephone Encounter (Signed)
Patient called stating she has ghout and she wants her folic acids added to her blood work .Going in the morning to have done.

## 2013-09-23 NOTE — Telephone Encounter (Signed)
Notified patient uric acid was ordered.

## 2013-09-23 NOTE — Addendum Note (Signed)
Addended by: Jesusita Oka on: 09/23/2013 11:42 AM   Modules accepted: Orders

## 2013-09-24 DIAGNOSIS — I10 Essential (primary) hypertension: Secondary | ICD-10-CM | POA: Diagnosis not present

## 2013-09-24 DIAGNOSIS — R5383 Other fatigue: Secondary | ICD-10-CM | POA: Diagnosis not present

## 2013-09-24 DIAGNOSIS — R5381 Other malaise: Secondary | ICD-10-CM | POA: Diagnosis not present

## 2013-09-24 DIAGNOSIS — E7800 Pure hypercholesterolemia, unspecified: Secondary | ICD-10-CM | POA: Diagnosis not present

## 2013-09-24 DIAGNOSIS — M109 Gout, unspecified: Secondary | ICD-10-CM | POA: Diagnosis not present

## 2013-09-24 LAB — BASIC METABOLIC PANEL
BUN: 17 mg/dL (ref 6–23)
CO2: 29 mEq/L (ref 19–32)
Calcium: 9.3 mg/dL (ref 8.4–10.5)
Chloride: 99 mEq/L (ref 96–112)
Creat: 0.96 mg/dL (ref 0.50–1.10)
Glucose, Bld: 100 mg/dL — ABNORMAL HIGH (ref 70–99)
Potassium: 4.1 mEq/L (ref 3.5–5.3)
Sodium: 140 mEq/L (ref 135–145)

## 2013-09-24 LAB — TSH: TSH: 1.888 u[IU]/mL (ref 0.350–4.500)

## 2013-09-24 LAB — URIC ACID: Uric Acid, Serum: 9.9 mg/dL — ABNORMAL HIGH (ref 2.4–7.0)

## 2013-09-24 LAB — LIPID PANEL
Cholesterol: 189 mg/dL (ref 0–200)
HDL: 43 mg/dL (ref >39)
LDL Cholesterol: 115 mg/dL — ABNORMAL HIGH (ref 0–99)
Total CHOL/HDL Ratio: 4.4 Ratio
Triglycerides: 153 mg/dL — ABNORMAL HIGH (ref <150)
VLDL: 31 mg/dL (ref 0–40)

## 2013-09-26 MED ORDER — ALLOPURINOL 100 MG PO TABS: 100.0000 mg | ORAL_TABLET | Freq: Every day | ORAL | Status: DC

## 2013-09-26 MED ORDER — PRAVASTATIN SODIUM 80 MG PO TABS: 80.0000 mg | ORAL_TABLET | Freq: Every day | ORAL | Status: DC

## 2013-09-26 NOTE — Addendum Note (Signed)
Addended by: Carmelina Noun on: 09/26/2013 04:34 PM   Modules accepted: Orders, Medications

## 2013-09-26 NOTE — Addendum Note (Signed)
Addended by: Carmelina Noun on: 09/26/2013 04:39 PM   Modules accepted: Orders

## 2013-09-30 ENCOUNTER — Encounter: Payer: Self-pay | Admitting: Family Medicine

## 2013-09-30 ENCOUNTER — Ambulatory Visit (INDEPENDENT_AMBULATORY_CARE_PROVIDER_SITE_OTHER): Payer: Medicare Other | Admitting: Family Medicine

## 2013-09-30 VITALS — BP 110/72 | Ht 62.0 in | Wt 142.0 lb

## 2013-09-30 DIAGNOSIS — E7800 Pure hypercholesterolemia, unspecified: Secondary | ICD-10-CM

## 2013-09-30 DIAGNOSIS — M899 Disorder of bone, unspecified: Secondary | ICD-10-CM

## 2013-09-30 DIAGNOSIS — R7301 Impaired fasting glucose: Secondary | ICD-10-CM | POA: Diagnosis not present

## 2013-09-30 DIAGNOSIS — I10 Essential (primary) hypertension: Secondary | ICD-10-CM

## 2013-09-30 DIAGNOSIS — M8580 Other specified disorders of bone density and structure, unspecified site: Secondary | ICD-10-CM

## 2013-09-30 DIAGNOSIS — M949 Disorder of cartilage, unspecified: Secondary | ICD-10-CM

## 2013-09-30 DIAGNOSIS — Z79899 Other long term (current) drug therapy: Secondary | ICD-10-CM | POA: Diagnosis not present

## 2013-09-30 LAB — POCT GLYCOSYLATED HEMOGLOBIN (HGB A1C): Hemoglobin A1C: 5.7

## 2013-09-30 NOTE — Addendum Note (Signed)
Addended by: Carmelina Noun on: 09/30/2013 04:50 PM   Modules accepted: Orders

## 2013-09-30 NOTE — Progress Notes (Signed)
   Subjective:    Patient ID: Terri Douglas, female    DOB: Jun 29, 1945, 68 y.o.   MRN: [id number]  HPIFollow up bloodwork. Patient states no other concerns today. She has started pravastatin 80mg  last week.  She's had to be on cholesterol medicine the past she also has history of gout she wonders about what she can eat dietary wise to help with that.   Review of Systems    patient denies chest pain shortness breath nausea vomiting diarrhea denies excessive thirst or urination. Objective:   Physical Exam  Lungs are clear hearts regular pulse normal blood pressure good extremities no edema abdomen soft      Assessment & Plan:  1. High cholesterol She will take her medication watch her diet and recheck lipid liver profile in approximately 8 weeks she will followup in 6 months  2. Hypertension Blood pressure overall doing fine continue current measures  3. Impaired fasting glucose Borderline. A1c looks good. Cautioned on diet. Gallop-stable continue allopurinol In 6 months time will need lab work including uric acids/metabolic 7/lipid/liver - POCT glycosylated hemoglobin (Hb A1C)

## 2013-09-30 NOTE — Patient Instructions (Signed)

## 2013-10-23 DIAGNOSIS — Z124 Encounter for screening for malignant neoplasm of cervix: Secondary | ICD-10-CM | POA: Diagnosis not present

## 2013-10-23 DIAGNOSIS — Z1231 Encounter for screening mammogram for malignant neoplasm of breast: Secondary | ICD-10-CM | POA: Diagnosis not present

## 2013-10-24 ENCOUNTER — Encounter (INDEPENDENT_AMBULATORY_CARE_PROVIDER_SITE_OTHER): Payer: Self-pay

## 2013-10-24 ENCOUNTER — Ambulatory Visit (HOSPITAL_COMMUNITY)
Admission: RE | Admit: 2013-10-24 | Discharge: 2013-10-24 | Disposition: A | Discharge status: Home / Self Care | Payer: Medicare Other | Source: Ambulatory Visit | Attending: Family Medicine | Admitting: Family Medicine

## 2013-10-24 DIAGNOSIS — M949 Disorder of cartilage, unspecified: Principal | ICD-10-CM

## 2013-10-24 DIAGNOSIS — Z780 Asymptomatic menopausal state: Secondary | ICD-10-CM | POA: Diagnosis not present

## 2013-10-24 DIAGNOSIS — M899 Disorder of bone, unspecified: Secondary | ICD-10-CM | POA: Diagnosis not present

## 2013-10-28 ENCOUNTER — Encounter: Payer: Self-pay | Admitting: Family Medicine

## 2013-10-28 DIAGNOSIS — M8580 Other specified disorders of bone density and structure, unspecified site: Secondary | ICD-10-CM | POA: Insufficient documentation

## 2013-10-29 NOTE — Progress Notes (Signed)
Patient notified and verbalized understanding of the test results. No further questions. 

## 2013-11-04 ENCOUNTER — Telehealth: Payer: Self-pay | Admitting: Family Medicine

## 2013-11-04 MED ORDER — ALPRAZOLAM 1 MG PO TABS: ORAL_TABLET | ORAL | Status: DC

## 2013-11-04 MED ORDER — LOSARTAN POTASSIUM 50 MG PO TABS: ORAL_TABLET | ORAL | Status: DC

## 2013-11-04 MED ORDER — PANTOPRAZOLE SODIUM 40 MG PO TBEC: DELAYED_RELEASE_TABLET | ORAL | Status: DC

## 2013-11-04 MED ORDER — TORSEMIDE 20 MG PO TABS: ORAL_TABLET | ORAL | Status: DC

## 2013-11-04 NOTE — Telephone Encounter (Signed)
May have 6 refills of standard meds Xanax can be refilled early as requested ( ok by me but insurance may regulations beyond my control)

## 2013-11-04 NOTE — Telephone Encounter (Signed)
ALPRAZolam (XANAX) 1 MG tablet - pt states this one is not due till the 5th but is leaving for out of town tomorrow morning. Can she get this one earlier?   pantoprazole (PROTONIX) 40 MG tablet losartan (COZAAR) 50 MG tablet  torsemide (DEMADEX) 20 MG tablet   4/27 last seen    Refill please

## 2013-11-04 NOTE — Telephone Encounter (Signed)
Med refills sent to pharmacy. Patient was notified.

## 2013-12-11 ENCOUNTER — Other Ambulatory Visit: Payer: Self-pay | Admitting: Family Medicine

## 2013-12-11 NOTE — Telephone Encounter (Signed)
4 refills on Xanax

## 2013-12-18 DIAGNOSIS — N951 Menopausal and female climacteric states: Secondary | ICD-10-CM | POA: Diagnosis not present

## 2013-12-23 ENCOUNTER — Encounter

## 2013-12-23 DIAGNOSIS — E7800 Pure hypercholesterolemia, unspecified: Secondary | ICD-10-CM | POA: Diagnosis not present

## 2013-12-23 DIAGNOSIS — M109 Gout, unspecified: Secondary | ICD-10-CM | POA: Diagnosis not present

## 2013-12-23 DIAGNOSIS — Z79899 Other long term (current) drug therapy: Secondary | ICD-10-CM | POA: Diagnosis not present

## 2013-12-23 DIAGNOSIS — I10 Essential (primary) hypertension: Secondary | ICD-10-CM | POA: Diagnosis not present

## 2013-12-23 LAB — LIPID PANEL
Cholesterol: 182 mg/dL (ref 0–200)
HDL: 47 mg/dL (ref >39)
LDL Cholesterol: 81 mg/dL (ref 0–99)
Total CHOL/HDL Ratio: 3.9 Ratio
Triglycerides: 271 mg/dL — ABNORMAL HIGH (ref <150)
VLDL: 54 mg/dL — ABNORMAL HIGH (ref 0–40)

## 2013-12-23 LAB — BASIC METABOLIC PANEL
BUN: 10 mg/dL (ref 6–23)
CO2: 32 mEq/L (ref 19–32)
Calcium: 9 mg/dL (ref 8.4–10.5)
Chloride: 102 mEq/L (ref 96–112)
Creat: 0.97 mg/dL (ref 0.50–1.10)
Glucose, Bld: 93 mg/dL (ref 70–99)
Potassium: 4.4 mEq/L (ref 3.5–5.3)
Sodium: 142 mEq/L (ref 135–145)

## 2013-12-23 LAB — HEPATIC FUNCTION PANEL
ALT: 19 U/L (ref 0–35)
AST: 24 U/L (ref 0–37)
Albumin: 4.1 g/dL (ref 3.5–5.2)
Alkaline Phosphatase: 60 U/L (ref 39–117)
Bilirubin, Direct: 0.1 mg/dL (ref 0.0–0.3)
Indirect Bilirubin: 0.5 mg/dL (ref 0.2–1.2)
Total Bilirubin: 0.6 mg/dL (ref 0.2–1.2)
Total Protein: 6.3 g/dL (ref 6.0–8.3)

## 2013-12-23 LAB — URIC ACID: Uric Acid, Serum: 6.6 mg/dL (ref 2.4–7.0)

## 2013-12-25 NOTE — Progress Notes (Signed)
Patient notified and verbalized understanding of the test results. No further questions. 

## 2014-01-13 ENCOUNTER — Other Ambulatory Visit: Payer: Self-pay | Admitting: Family Medicine

## 2014-03-10 DIAGNOSIS — Z23 Encounter for immunization: Secondary | ICD-10-CM | POA: Diagnosis not present

## 2014-03-21 ENCOUNTER — Ambulatory Visit (INDEPENDENT_AMBULATORY_CARE_PROVIDER_SITE_OTHER): Payer: Medicare Other | Admitting: Family Medicine

## 2014-03-21 ENCOUNTER — Encounter: Payer: Self-pay | Admitting: Family Medicine

## 2014-03-21 ENCOUNTER — Ambulatory Visit (HOSPITAL_COMMUNITY)
Admission: RE | Admit: 2014-03-21 | Discharge: 2014-03-21 | Disposition: A | Discharge status: Home / Self Care | Payer: Medicare Other | Source: Ambulatory Visit | Attending: Family Medicine | Admitting: Family Medicine

## 2014-03-21 VITALS — BP 122/60 | Ht 62.0 in | Wt 148.0 lb

## 2014-03-21 DIAGNOSIS — R937 Abnormal findings on diagnostic imaging of other parts of musculoskeletal system: Secondary | ICD-10-CM | POA: Insufficient documentation

## 2014-03-21 DIAGNOSIS — M7989 Other specified soft tissue disorders: Secondary | ICD-10-CM | POA: Diagnosis not present

## 2014-03-21 DIAGNOSIS — M79674 Pain in right toe(s): Secondary | ICD-10-CM | POA: Insufficient documentation

## 2014-03-21 NOTE — Progress Notes (Signed)
   Subjective:    Patient ID: Garnette Gunner, female    DOB: May 14, 1946, 68 y.o.   MRN: [id number]  HPIRight foot pain. Pt missed last step and landed on right foot on 03/17/14. Having pain and swelling. Took ibuprofen on Monday without relief.   Missed last step,  Stumbled and then did not fall  Ongoing pain and swelling  Ibuprofen the first day   Review of Systems No knee ee pain, no chest pain no cough no shortness of breath    Objective:   Physical Exam  Alert no apparent distress vitals stable. Lungs clear. Heart regular in rhythm. Right great toe significantly hematoma and distal pain pulses good sensation good      Assessment & Plan:  Impression acute foot injury plan sent for x-rays x-rays showed at most a hairline fracture in distal talked of great toe. This may or may not even be present. Not seen on other views. Patient may just have first toe equivalent or very mild fracture. Use anti-inflammatory medication. Elevate leg when possible. Use firm shoe. If pain persists may need to add surgical shoe. WSL

## 2014-03-21 NOTE — Progress Notes (Signed)
Patient notified and verbalized understanding of test results. Patient did not have any questions.

## 2014-04-03 DIAGNOSIS — N61 Inflammatory disorders of breast: Secondary | ICD-10-CM | POA: Diagnosis not present

## 2014-04-14 ENCOUNTER — Telehealth: Payer: Self-pay | Admitting: Family Medicine

## 2014-04-14 ENCOUNTER — Ambulatory Visit
Admission: RE | Admit: 2014-04-14 | Discharge: 2014-04-14 | Disposition: A | Discharge status: Home / Self Care | Payer: Medicare Other | Source: Ambulatory Visit | Attending: Obstetrics & Gynecology | Admitting: Obstetrics & Gynecology

## 2014-04-14 ENCOUNTER — Other Ambulatory Visit: Payer: Self-pay | Admitting: Family Medicine

## 2014-04-14 ENCOUNTER — Other Ambulatory Visit: Payer: Self-pay | Admitting: Obstetrics & Gynecology

## 2014-04-14 DIAGNOSIS — N644 Mastodynia: Secondary | ICD-10-CM

## 2014-04-14 DIAGNOSIS — N6459 Other signs and symptoms in breast: Secondary | ICD-10-CM | POA: Diagnosis not present

## 2014-04-14 MED ORDER — ALPRAZOLAM 1 MG PO TABS: ORAL_TABLET | ORAL | Status: DC

## 2014-04-14 NOTE — Telephone Encounter (Signed)
Patient also needs pravastatin 80 mg and allopurinol 100 mg. Also, wanting to know if we can change her alprazolam to 90 day supply instead of 30 day supply.  [place]  She is requesting this today because she is going out of town tomorrow.

## 2014-04-14 NOTE — Telephone Encounter (Signed)
30 with 5 refills on Xanax

## 2014-04-14 NOTE — Telephone Encounter (Signed)
Last seen 09/30/13 

## 2014-04-14 NOTE — Telephone Encounter (Signed)
ALPRAZolam (XANAX) 1 MG table  Pt needs refill call when sent

## 2014-04-14 NOTE — Telephone Encounter (Signed)
Patient notified and verbalized understanding. 

## 2014-04-15 NOTE — Telephone Encounter (Signed)
May have this +4 refills 

## 2014-04-17 NOTE — Telephone Encounter (Signed)
May have this +4 refills 

## 2014-05-16 ENCOUNTER — Other Ambulatory Visit: Payer: Self-pay | Admitting: Family Medicine

## 2014-05-22 DIAGNOSIS — Z01419 Encounter for gynecological examination (general) (routine) without abnormal findings: Secondary | ICD-10-CM | POA: Diagnosis not present

## 2014-06-18 ENCOUNTER — Encounter: Payer: Self-pay | Admitting: Family Medicine

## 2014-06-18 ENCOUNTER — Ambulatory Visit (INDEPENDENT_AMBULATORY_CARE_PROVIDER_SITE_OTHER): Payer: Medicare Other | Admitting: Family Medicine

## 2014-06-18 ENCOUNTER — Ambulatory Visit (HOSPITAL_COMMUNITY)
Admission: RE | Admit: 2014-06-18 | Discharge: 2014-06-18 | Disposition: A | Discharge status: Home / Self Care | Payer: Medicare Other | Source: Ambulatory Visit | Attending: Family Medicine | Admitting: Family Medicine

## 2014-06-18 VITALS — BP 122/72 | Ht 62.0 in | Wt 148.0 lb

## 2014-06-18 DIAGNOSIS — Z79899 Other long term (current) drug therapy: Secondary | ICD-10-CM

## 2014-06-18 DIAGNOSIS — I10 Essential (primary) hypertension: Secondary | ICD-10-CM

## 2014-06-18 DIAGNOSIS — R739 Hyperglycemia, unspecified: Secondary | ICD-10-CM | POA: Diagnosis not present

## 2014-06-18 DIAGNOSIS — R0602 Shortness of breath: Secondary | ICD-10-CM | POA: Insufficient documentation

## 2014-06-18 DIAGNOSIS — E785 Hyperlipidemia, unspecified: Secondary | ICD-10-CM | POA: Diagnosis not present

## 2014-06-18 DIAGNOSIS — M1000 Idiopathic gout, unspecified site: Secondary | ICD-10-CM | POA: Diagnosis not present

## 2014-06-18 DIAGNOSIS — Z23 Encounter for immunization: Secondary | ICD-10-CM

## 2014-06-18 DIAGNOSIS — R600 Localized edema: Secondary | ICD-10-CM | POA: Diagnosis not present

## 2014-06-18 DIAGNOSIS — R0600 Dyspnea, unspecified: Secondary | ICD-10-CM | POA: Diagnosis not present

## 2014-06-18 DIAGNOSIS — M109 Gout, unspecified: Secondary | ICD-10-CM | POA: Insufficient documentation

## 2014-06-18 DIAGNOSIS — R490 Dysphonia: Secondary | ICD-10-CM

## 2014-06-18 MED ORDER — ALPRAZOLAM 1 MG PO TABS: 1.0000 mg | ORAL_TABLET | Freq: Every evening | ORAL | Status: DC | PRN

## 2014-06-18 NOTE — Patient Instructions (Signed)

## 2014-06-18 NOTE — Progress Notes (Signed)
   Subjective:    Patient ID: Terri Douglas, female    DOB: [birth year], 69 y.o.   MRN: [id number]  HPI Patient is here today for a check up.  Patient overall doing fairly well. Has not had any gout flareups lately. Takes her medicine watch his diet to a degree Does not exercise on a regular basis. Does not always watch starches in the diet has history hyperglycemia History hyperlipidemia does not always watch fats in her diet Does a fair job of being careful with salts but not as good as she could be Patient relates occasional shortness of breath when she lays on her left side she noticed a low but a swelling in her legs denies any cough but does relate hoarseness Last seen for a chronic visit was 09/30/13.  She would like a 90 day supply on her xanax.  Patient up-to-date on colonoscopy but has had some intermittent constipation no blood in the stool  Review of Systems Patient states at times she gets a little short of breath when she lays on her left side., Patient also relates a lot of hoarseness and congestion worse toward the end of the day been going on for months, worried about cancer. Patient denies chest pressure tightness. States swelling intermittently in her legs.    Objective:   Physical Exam HEENT benign neck no masses some hoarseness noted lungs are clear no crackles heart is regular abdomen soft extremities slight edema skin warm dry       Assessment & Plan:  Constipation-fiber additive recommended. If this does not dramatically improve things over the next few weeks she is to let us know she is up-to-date on colonoscopy had approximately a year ago  Leg swelling-she will use a diuretic she will minimize salt intake increase walking if still having trouble we may have to boost up the amount of the diuretic  Lipid-history hyperlipidemia continue pravastatin also recommend checking lab work.  1. Essential hypertension Increase walking watch salt diet watch weight -  Basic metabolic panel  2. Hyperlipidemia Take medication check lab work - Lipid panel  3. Hyperglycemia Minimize starches check lab work - Uric acid - Hemoglobin A1c  4. Idiopathic gout, unspecified chronicity, unspecified site Will check uric acid level. Continue medication  5. Hoarseness of voice Referral to ENT for further evaluation of this probably will need fiberoptic evaluation of the vocal cords - Ambulatory referral to ENT  6. Dyspnea I truly doubt that she has underlying heart disease although we do need to look at a chest x-ray because of the possibility of enlarged heart area plus also the hoarseness - DG Chest 2 View; Future  7. Pedal edema She has trace edema currently minimize salt exercise watch diet possibly will need increased diuretic  8. High risk medication use Check lab work - Hepatic function panel  Follow-up 6 months

## 2014-06-19 DIAGNOSIS — I10 Essential (primary) hypertension: Secondary | ICD-10-CM | POA: Diagnosis not present

## 2014-06-19 DIAGNOSIS — Z79899 Other long term (current) drug therapy: Secondary | ICD-10-CM | POA: Diagnosis not present

## 2014-06-19 DIAGNOSIS — R739 Hyperglycemia, unspecified: Secondary | ICD-10-CM | POA: Diagnosis not present

## 2014-06-19 DIAGNOSIS — E785 Hyperlipidemia, unspecified: Secondary | ICD-10-CM | POA: Diagnosis not present

## 2014-06-19 LAB — BASIC METABOLIC PANEL
BUN: 12 mg/dL (ref 6–23)
CO2: 32 mEq/L (ref 19–32)
Calcium: 8.8 mg/dL (ref 8.4–10.5)
Chloride: 102 mEq/L (ref 96–112)
Creat: 0.85 mg/dL (ref 0.50–1.10)
Glucose, Bld: 94 mg/dL (ref 70–99)
Potassium: 3.8 mEq/L (ref 3.5–5.3)
Sodium: 141 mEq/L (ref 135–145)

## 2014-06-19 LAB — HEPATIC FUNCTION PANEL
ALT: 15 U/L (ref 0–35)
AST: 16 U/L (ref 0–37)
Albumin: 3.4 g/dL — ABNORMAL LOW (ref 3.5–5.2)
Alkaline Phosphatase: 64 U/L (ref 39–117)
Bilirubin, Direct: 0.1 mg/dL (ref 0.0–0.3)
Indirect Bilirubin: 0.4 mg/dL (ref 0.2–1.2)
Total Bilirubin: 0.5 mg/dL (ref 0.2–1.2)
Total Protein: 6 g/dL (ref 6.0–8.3)

## 2014-06-19 LAB — URIC ACID: Uric Acid, Serum: 6 mg/dL (ref 2.4–7.0)

## 2014-06-19 LAB — LIPID PANEL
Cholesterol: 183 mg/dL (ref 0–200)
HDL: 48 mg/dL (ref >39)
LDL Cholesterol: 88 mg/dL (ref 0–99)
Total CHOL/HDL Ratio: 3.8 Ratio
Triglycerides: 236 mg/dL — ABNORMAL HIGH (ref <150)
VLDL: 47 mg/dL — ABNORMAL HIGH (ref 0–40)

## 2014-06-19 LAB — HEMOGLOBIN A1C
Hgb A1c MFr Bld: 6.2 % — ABNORMAL HIGH (ref <5.7)
Mean Plasma Glucose: 131 mg/dL — ABNORMAL HIGH (ref <117)

## 2014-06-26 ENCOUNTER — Other Ambulatory Visit: Payer: Self-pay | Admitting: Family Medicine

## 2014-07-04 ENCOUNTER — Inpatient Hospital Stay: Admit: 2014-07-04 | Payer: MEDICARE | Primary: Physician Assistant

## 2014-07-04 DIAGNOSIS — E780 Pure hypercholesterolemia: Secondary | ICD-10-CM

## 2014-07-04 LAB — CBC WITH AUTOMATED DIFF
ABS. BASOPHILS: 0 10*3/uL (ref 0.0–0.06)
ABS. EOSINOPHILS: 0.2 10*3/uL (ref 0.0–0.4)
ABS. LYMPHOCYTES: 1.6 10*3/uL (ref 0.9–3.6)
ABS. MONOCYTES: 0.3 10*3/uL (ref 0.05–1.2)
ABS. NEUTROPHILS: 2.3 10*3/uL (ref 1.8–8.0)
BASOPHILS: 1 % (ref 0–2)
EOSINOPHILS: 5 % (ref 0–5)
HCT: 44.5 % (ref 35.0–45.0)
HGB: 14.9 g/dL (ref 12.0–16.0)
LYMPHOCYTES: 36 % (ref 21–52)
MCH: 30.5 PG (ref 24.0–34.0)
MCHC: 33.5 g/dL (ref 31.0–37.0)
MCV: 91 FL (ref 74.0–97.0)
MONOCYTES: 7 % (ref 3–10)
MPV: 11.4 FL (ref 9.2–11.8)
NEUTROPHILS: 51 % (ref 40–73)
PLATELET: 181 10*3/uL (ref 135–420)
RBC: 4.89 M/uL (ref 4.20–5.30)
RDW: 12.9 % (ref 11.6–14.5)
WBC: 4.5 10*3/uL — ABNORMAL LOW (ref 4.6–13.2)

## 2014-07-04 LAB — METABOLIC PANEL, BASIC
Anion gap: 6 mmol/L (ref 3.0–18)
BUN/Creatinine ratio: 23 — ABNORMAL HIGH (ref 12–20)
BUN: 19 MG/DL — ABNORMAL HIGH (ref 7.0–18)
CO2: 30 mmol/L (ref 21–32)
Calcium: 9 MG/DL (ref 8.5–10.1)
Chloride: 107 mmol/L (ref 100–108)
Creatinine: 0.82 MG/DL (ref 0.6–1.3)
GFR est AA: >60 mL/min/{1.73_m2} (ref >60)
GFR est non-AA: >60 mL/min/{1.73_m2} (ref >60)
Glucose: 95 mg/dL (ref 74–99)
Potassium: 4.2 mmol/L (ref 3.5–5.5)
Sodium: 143 mmol/L (ref 136–145)

## 2014-07-04 LAB — LIPID PANEL
CHOL/HDL Ratio: 2.8 (ref 0–5.0)
Cholesterol, total: 163 MG/DL (ref <200)
HDL Cholesterol: 58 MG/DL (ref 40–60)
LDL, calculated: 78.8 MG/DL (ref 0–100)
Triglyceride: 131 MG/DL (ref <150)
VLDL, calculated: 26.2 MG/DL

## 2014-07-04 LAB — HEPATIC FUNCTION PANEL
A-G Ratio: 1.1 (ref 0.8–1.7)
ALT (SGPT): 31 U/L (ref 13–56)
AST (SGOT): 23 U/L (ref 15–37)
Albumin: 3.8 g/dL (ref 3.4–5.0)
Alk. phosphatase: 76 U/L (ref 45–117)
Bilirubin, direct: 0.1 MG/DL (ref 0.0–0.2)
Bilirubin, total: 0.6 MG/DL (ref 0.2–1.0)
Globulin: 3.4 g/dL (ref 2.0–4.0)
Protein, total: 7.2 g/dL (ref 6.4–8.2)

## 2014-07-04 LAB — VITAMIN D, 25 HYDROXY: Vitamin D 25-Hydroxy: 31.8 ng/mL (ref 30–100)

## 2014-07-04 LAB — TSH 3RD GENERATION: TSH: 0.22 u[IU]/mL — ABNORMAL LOW (ref 0.36–3.74)

## 2014-07-04 LAB — T4, FREE: T4, Free: 1.4 NG/DL (ref 0.7–1.5)

## 2014-07-07 DIAGNOSIS — R490 Dysphonia: Secondary | ICD-10-CM | POA: Diagnosis not present

## 2014-07-07 DIAGNOSIS — K219 Gastro-esophageal reflux disease without esophagitis: Secondary | ICD-10-CM | POA: Diagnosis not present

## 2014-07-21 ENCOUNTER — Encounter

## 2014-07-22 ENCOUNTER — Telehealth: Payer: Self-pay | Admitting: Family Medicine

## 2014-07-22 ENCOUNTER — Other Ambulatory Visit: Payer: Self-pay | Admitting: *Deleted

## 2014-07-22 MED ORDER — PRAVASTATIN SODIUM 80 MG PO TABS: ORAL_TABLET | ORAL | Status: DC

## 2014-07-22 NOTE — Telephone Encounter (Signed)
Patient says that the zocor 80 mg that she is on seems awfully high.  She wants to know why she is on such a high dosage?  And also needing a refill if this should be correct dosage.   [place]

## 2014-07-22 NOTE — Telephone Encounter (Signed)
[facility] 2/16 (I don't see Zocor on her current med list)

## 2014-07-22 NOTE — Telephone Encounter (Signed)
Discussed with pt she is on pravastatin 80 mg and she has been on this since November d/t her cholesterol. Pt verbalized understanding. Sent refill in.

## 2014-07-23 ENCOUNTER — Encounter: Payer: Self-pay | Admitting: Family Medicine

## 2014-07-23 ENCOUNTER — Ambulatory Visit (INDEPENDENT_AMBULATORY_CARE_PROVIDER_SITE_OTHER): Payer: Medicare Other | Admitting: Family Medicine

## 2014-07-23 VITALS — BP 116/76 | Ht 62.0 in | Wt 148.0 lb

## 2014-07-23 DIAGNOSIS — N3000 Acute cystitis without hematuria: Secondary | ICD-10-CM | POA: Diagnosis not present

## 2014-07-23 LAB — POCT URINALYSIS DIPSTICK
Bilirubin, UA: NEGATIVE
Blood, UA: NEGATIVE
Glucose, UA: NEGATIVE
Leukocytes, UA: NEGATIVE
Nitrite, UA: NEGATIVE
Protein, UA: NEGATIVE
Spec Grav, UA: <=1.005
Urobilinogen, UA: NEGATIVE
pH, UA: 5

## 2014-07-23 MED ORDER — CEFPROZIL 500 MG PO TABS: 500.0000 mg | ORAL_TABLET | Freq: Two times a day (BID) | ORAL | Status: AC

## 2014-07-23 MED ORDER — PHENAZOPYRIDINE HCL 200 MG PO TABS: 200.0000 mg | ORAL_TABLET | Freq: Three times a day (TID) | ORAL | Status: DC | PRN

## 2014-07-23 NOTE — Progress Notes (Signed)
   Subjective:    Patient ID: Terri Douglas, female    DOB: Nov 28, 1945, 69 y.o.   MRN: [id number]  Urinary Tract Infection  This is a new problem. The current episode started in the past 7 days. The problem occurs every urination. The problem has been gradually worsening. Quality: IT HURTS. The pain is at a severity of 8/10. The pain is moderate. There has been no fever. Associated symptoms include urgency.    PMH benign  Review of Systems  Genitourinary: Positive for urgency.   Patient denies fever relates some lower abdominal tenderness denies flank pain no vomiting or diarrhea    Objective:   Physical Exam  Lungs clear heart regular flanks nontender abdomen soft no guarding rebound urinalysis with wbc's      Assessment & Plan:  UTI no sign of pyelonephritis warning signs were discussed antibiotics prescribed Pyridium when necessary follow-up if ongoing troubles

## 2014-07-23 NOTE — Patient Instructions (Addendum)
Urinary Tract Infection Urinary tract infections (UTIs) can develop anywhere along your urinary tract. Your urinary tract is your body's drainage system for removing wastes and extra water. Your urinary tract includes two kidneys, two ureters, a bladder, and a urethra. Your kidneys are a pair of bean-shaped organs. Each kidney is about the size of your fist. They are located below your ribs, one on each side of your spine. CAUSES Infections are caused by microbes, which are microscopic organisms, including fungi, viruses, and bacteria. These organisms are so small that they can only be seen through a microscope. Bacteria are the microbes that most commonly cause UTIs. SYMPTOMS  Symptoms of UTIs may vary by age and gender of the patient and by the location of the infection. Symptoms in young women typically include a frequent and intense urge to urinate and a painful, burning feeling in the bladder or urethra during urination. Older women and men are more likely to be tired, shaky, and weak and have muscle aches and abdominal pain. A fever may mean the infection is in your kidneys. Other symptoms of a kidney infection include pain in your back or sides below the ribs, nausea, and vomiting. DIAGNOSIS To diagnose a UTI, your caregiver will ask you about your symptoms. Your caregiver also will ask to provide a urine sample. The urine sample will be tested for bacteria and white blood cells. White blood cells are made by your body to help fight infection. TREATMENT  Typically, UTIs can be treated with medication. Because most UTIs are caused by a bacterial infection, they usually can be treated with the use of antibiotics. The choice of antibiotic and length of treatment depend on your symptoms and the type of bacteria causing your infection. HOME CARE INSTRUCTIONS  If you were prescribed antibiotics, take them exactly as your caregiver instructs you. Finish the medication even if you feel better after you  have only taken some of the medication.  Drink enough water and fluids to keep your urine clear or pale yellow.  Avoid caffeine, tea, and carbonated beverages. They tend to irritate your bladder.  Empty your bladder often. Avoid holding urine for long periods of time.  Empty your bladder before and after sexual intercourse.  After a bowel movement, women should cleanse from front to back. Use each tissue only once. SEEK MEDICAL CARE IF:   You have back pain.  You develop a fever.  Your symptoms do not begin to resolve within 3 days. SEEK IMMEDIATE MEDICAL CARE IF:   You have severe back pain or lower abdominal pain.  You develop chills.  You have nausea or vomiting.  You have continued burning or discomfort with urination. MAKE SURE YOU:   Understand these instructions.  Will watch your condition.  Will get help right away if you are not doing well or get worse. Document Released: 03/02/2005 Document Revised: 11/22/2011 Document Reviewed: 07/01/2011 [facility] Patient Information 2015 [place], [place]. This information is not intended to replace advice given to you by your health care provider. Make sure you discuss any questions you have with your health care provider.  Food Choices for Gastroesophageal Reflux Disease When you have gastroesophageal reflux disease (GERD), the foods you eat and your eating habits are very important. Choosing the right foods can help ease the discomfort of GERD. WHAT GENERAL GUIDELINES DO I NEED TO FOLLOW?  Choose fruits, vegetables, whole grains, low-fat dairy products, and low-fat meat, fish, and poultry.  Limit fats such as oils, salad dressings,  butter, nuts, and avocado.  Keep a food diary to identify foods that cause symptoms.  Avoid foods that cause reflux. These may be different for different people.  Eat frequent small meals instead of three large meals each day.  Eat your meals slowly, in a relaxed setting.  Limit fried  foods.  Cook foods using methods other than frying.  Avoid drinking alcohol.  Avoid drinking large amounts of liquids with your meals.  Avoid bending over or lying down until 2-3 hours after eating. WHAT FOODS ARE NOT RECOMMENDED? The following are some foods and drinks that may worsen your symptoms: Vegetables Tomatoes. Tomato juice. Tomato and spaghetti sauce. Chili peppers. Onion and garlic. Horseradish. Fruits Oranges, grapefruit, and lemon (fruit and juice). Meats High-fat meats, fish, and poultry. This includes hot dogs, ribs, ham, sausage, salami, and bacon. Dairy Whole milk and chocolate milk. Sour cream. Cream. Butter. Ice cream. Cream cheese.  Beverages Coffee and tea, with or without caffeine. Carbonated beverages or energy drinks. Condiments Hot sauce. Barbecue sauce.  Sweets/Desserts Chocolate and cocoa. Donuts. Peppermint and spearmint. Fats and Oils High-fat foods, including [place] fries and potato chips. Other Vinegar. Strong spices, such as black pepper, white pepper, red pepper, cayenne, curry powder, cloves, ginger, and chili powder. The items listed above may not be a complete list of foods and beverages to avoid. Contact your dietitian for more information. Document Released: 05/23/2005 Document Revised: 05/28/2013 Document Reviewed: 03/27/2013 [facility] Patient Information 2015 [place], [place]. This information is not intended to replace advice given to you by your health care provider. Make sure you discuss any questions you have with your health care provider.

## 2014-07-26 LAB — URINE CULTURE: Colony Count: >=100000

## 2014-07-28 ENCOUNTER — Other Ambulatory Visit: Payer: Self-pay | Admitting: Family Medicine

## 2014-08-04 DIAGNOSIS — K219 Gastro-esophageal reflux disease without esophagitis: Secondary | ICD-10-CM | POA: Diagnosis not present

## 2014-08-04 DIAGNOSIS — R490 Dysphonia: Secondary | ICD-10-CM | POA: Diagnosis not present

## 2014-08-06 DIAGNOSIS — H43819 Vitreous degeneration, unspecified eye: Secondary | ICD-10-CM | POA: Diagnosis not present

## 2014-08-06 DIAGNOSIS — H35371 Puckering of macula, right eye: Secondary | ICD-10-CM | POA: Diagnosis not present

## 2014-08-06 DIAGNOSIS — H524 Presbyopia: Secondary | ICD-10-CM | POA: Diagnosis not present

## 2014-08-15 ENCOUNTER — Inpatient Hospital Stay: Admit: 2014-08-15 | Payer: MEDICARE | Primary: Physician Assistant

## 2014-08-15 ENCOUNTER — Other Ambulatory Visit: Payer: Self-pay | Admitting: Family Medicine

## 2014-08-15 DIAGNOSIS — M8580 Other specified disorders of bone density and structure, unspecified site: Secondary | ICD-10-CM

## 2014-08-22 ENCOUNTER — Encounter: Payer: Self-pay | Admitting: Family Medicine

## 2014-08-22 ENCOUNTER — Ambulatory Visit (INDEPENDENT_AMBULATORY_CARE_PROVIDER_SITE_OTHER): Payer: Medicare Other | Admitting: Family Medicine

## 2014-08-22 VITALS — BP 120/78 | Temp 98.6°F | Ht 62.0 in | Wt 145.8 lb

## 2014-08-22 DIAGNOSIS — J209 Acute bronchitis, unspecified: Secondary | ICD-10-CM

## 2014-08-22 DIAGNOSIS — J019 Acute sinusitis, unspecified: Secondary | ICD-10-CM | POA: Diagnosis not present

## 2014-08-22 DIAGNOSIS — B9689 Other specified bacterial agents as the cause of diseases classified elsewhere: Secondary | ICD-10-CM

## 2014-08-22 MED ORDER — BENZONATATE 100 MG PO CAPS: 100.0000 mg | ORAL_CAPSULE | Freq: Four times a day (QID) | ORAL | Status: DC | PRN

## 2014-08-22 MED ORDER — LEVOFLOXACIN 500 MG PO TABS: 500.0000 mg | ORAL_TABLET | Freq: Every day | ORAL | Status: DC

## 2014-08-22 NOTE — Progress Notes (Signed)
   Subjective:    Patient ID: Terri Douglas, female    DOB: 03-02-1946, 69 y.o.   MRN: [id number]  Cough This is a new problem. The current episode started in the past 7 days. Associated symptoms include nasal congestion and rhinorrhea. Pertinent negatives include no chest pain, ear pain, fever, shortness of breath or wheezing. Treatments tried: cough drops and zyrtec.      Review of Systems  Constitutional: Negative for fever and activity change.  HENT: Positive for congestion and rhinorrhea. Negative for ear pain.   Eyes: Negative for discharge.  Respiratory: Positive for cough. Negative for shortness of breath and wheezing.   Cardiovascular: Negative for chest pain.       Objective:   Physical Exam  Constitutional: She appears well-developed.  HENT:  Head: Normocephalic.  Nose: Nose normal.  Mouth/Throat: Oropharynx is clear and moist. No oropharyngeal exudate.  Neck: Neck supple.  Cardiovascular: Normal rate and normal heart sounds.   No murmur heard. Pulmonary/Chest: Effort normal and breath sounds normal. She has no wheezes.  Lymphadenopathy:    She has no cervical adenopathy.  Skin: Skin is warm and dry.  Nursing note and vitals reviewed.   Patient not toxic      Assessment & Plan:  Persistent rhinosinusitis Acute rhinosinusitis bacterial Acute bronchitis Antibiotics prescribed Tessalon for cough Warning signs discussed follow-up if problems

## 2014-08-27 ENCOUNTER — Telehealth: Payer: Self-pay | Admitting: Family Medicine

## 2014-08-27 MED ORDER — PANTOPRAZOLE SODIUM 40 MG PO TBEC: 40.0000 mg | DELAYED_RELEASE_TABLET | Freq: Two times a day (BID) | ORAL | Status: DC

## 2014-08-27 NOTE — Telephone Encounter (Signed)
Pt states she is out of her pantoprazole (PROTONIX) 40 MG tablet   She says DR Arnette Norris told her to start taking it BID instead of daily She needs a new script sent to [facility] with  Directions of BID

## 2014-08-27 NOTE — Telephone Encounter (Signed)
Refill medication #60 tablets 1 twice a day for severe reflux with hoarseness 6 refills

## 2014-08-27 NOTE — Telephone Encounter (Signed)
Patient notified that med sent to pharmacy.

## 2014-09-01 DIAGNOSIS — K219 Gastro-esophageal reflux disease without esophagitis: Secondary | ICD-10-CM | POA: Diagnosis not present

## 2014-09-01 DIAGNOSIS — R490 Dysphonia: Secondary | ICD-10-CM | POA: Diagnosis not present

## 2014-10-23 ENCOUNTER — Other Ambulatory Visit: Payer: Self-pay | Admitting: Family Medicine

## 2014-10-27 DIAGNOSIS — K219 Gastro-esophageal reflux disease without esophagitis: Secondary | ICD-10-CM | POA: Diagnosis not present

## 2014-10-27 DIAGNOSIS — R490 Dysphonia: Secondary | ICD-10-CM | POA: Diagnosis not present

## 2014-10-29 DIAGNOSIS — Z1231 Encounter for screening mammogram for malignant neoplasm of breast: Secondary | ICD-10-CM | POA: Diagnosis not present

## 2014-11-14 ENCOUNTER — Other Ambulatory Visit: Payer: Self-pay | Admitting: Family Medicine

## 2014-12-05 ENCOUNTER — Inpatient Hospital Stay: Admit: 2014-12-05 | Payer: MEDICARE | Primary: Physician Assistant

## 2014-12-05 DIAGNOSIS — I10 Essential (primary) hypertension: Secondary | ICD-10-CM

## 2014-12-05 LAB — METABOLIC PANEL, BASIC
Anion gap: 8 mmol/L (ref 3.0–18)
BUN/Creatinine ratio: 24 — ABNORMAL HIGH (ref 12–20)
BUN: 23 MG/DL — ABNORMAL HIGH (ref 7.0–18)
CO2: 30 mmol/L (ref 21–32)
Calcium: 9.8 MG/DL (ref 8.5–10.1)
Chloride: 108 mmol/L (ref 100–108)
Creatinine: 0.94 MG/DL (ref 0.6–1.3)
GFR est AA: >60 mL/min/{1.73_m2} (ref >60)
GFR est non-AA: 59 mL/min/{1.73_m2} — ABNORMAL LOW (ref >60)
Glucose: 95 mg/dL (ref 74–99)
Potassium: 4.6 mmol/L (ref 3.5–5.5)
Sodium: 146 mmol/L — ABNORMAL HIGH (ref 136–145)

## 2014-12-05 LAB — HEPATIC FUNCTION PANEL
A-G Ratio: 1.3 (ref 0.8–1.7)
ALT (SGPT): 28 U/L (ref 13–56)
AST (SGOT): 21 U/L (ref 15–37)
Albumin: 4.1 g/dL (ref 3.4–5.0)
Alk. phosphatase: 102 U/L (ref 45–117)
Bilirubin, direct: 0.1 MG/DL (ref 0.0–0.2)
Bilirubin, total: 0.4 MG/DL (ref 0.2–1.0)
Globulin: 3.2 g/dL (ref 2.0–4.0)
Protein, total: 7.3 g/dL (ref 6.4–8.2)

## 2014-12-05 LAB — LIPID PANEL
CHOL/HDL Ratio: 3.2 (ref 0–5.0)
Cholesterol, total: 202 MG/DL — ABNORMAL HIGH (ref <200)
HDL Cholesterol: 64 MG/DL — ABNORMAL HIGH (ref 40–60)
LDL, calculated: 116.8 MG/DL — ABNORMAL HIGH (ref 0–100)
Triglyceride: 106 MG/DL (ref <150)
VLDL, calculated: 21.2 MG/DL

## 2014-12-05 LAB — TSH AND FREE T4
T4, Free: 1.1 NG/DL (ref 0.7–1.5)
TSH: 2.79 u[IU]/mL (ref 0.36–3.74)

## 2014-12-18 ENCOUNTER — Other Ambulatory Visit: Payer: Self-pay | Admitting: Family Medicine

## 2014-12-19 NOTE — Telephone Encounter (Signed)
May have one refill #90

## 2014-12-24 DIAGNOSIS — R490 Dysphonia: Secondary | ICD-10-CM | POA: Diagnosis not present

## 2014-12-24 DIAGNOSIS — J387 Other diseases of larynx: Secondary | ICD-10-CM | POA: Diagnosis not present

## 2014-12-24 DIAGNOSIS — Z7982 Long term (current) use of aspirin: Secondary | ICD-10-CM | POA: Diagnosis not present

## 2014-12-24 DIAGNOSIS — R1319 Other dysphagia: Secondary | ICD-10-CM | POA: Diagnosis not present

## 2014-12-24 DIAGNOSIS — F449 Dissociative and conversion disorder, unspecified: Secondary | ICD-10-CM | POA: Diagnosis not present

## 2014-12-24 DIAGNOSIS — F444 Conversion disorder with motor symptom or deficit: Secondary | ICD-10-CM | POA: Diagnosis not present

## 2014-12-24 DIAGNOSIS — R1310 Dysphagia, unspecified: Secondary | ICD-10-CM | POA: Diagnosis not present

## 2014-12-26 DIAGNOSIS — R490 Dysphonia: Secondary | ICD-10-CM | POA: Diagnosis not present

## 2015-01-27 ENCOUNTER — Other Ambulatory Visit: Payer: Self-pay | Admitting: Family Medicine

## 2015-02-03 ENCOUNTER — Encounter

## 2015-02-12 ENCOUNTER — Inpatient Hospital Stay: Admit: 2015-02-12 | Payer: MEDICARE | Primary: Physician Assistant

## 2015-02-12 DIAGNOSIS — Z1231 Encounter for screening mammogram for malignant neoplasm of breast: Secondary | ICD-10-CM

## 2015-02-23 ENCOUNTER — Other Ambulatory Visit: Payer: Self-pay | Admitting: Family Medicine

## 2015-02-26 ENCOUNTER — Telehealth: Payer: Self-pay | Admitting: Family Medicine

## 2015-02-26 DIAGNOSIS — Z79899 Other long term (current) drug therapy: Secondary | ICD-10-CM

## 2015-02-26 DIAGNOSIS — R7303 Prediabetes: Secondary | ICD-10-CM

## 2015-02-26 DIAGNOSIS — E785 Hyperlipidemia, unspecified: Secondary | ICD-10-CM

## 2015-02-26 DIAGNOSIS — M109 Gout, unspecified: Secondary | ICD-10-CM

## 2015-02-26 NOTE — Telephone Encounter (Signed)
Lipid, liver, metabolic 7, hemoglobin Z4J, uric acid-gout, prediabetes, hyperlipidemia, diuretic use thank you May have refills on her medicine for 3 months, keep follow-up appointment

## 2015-02-26 NOTE — Telephone Encounter (Signed)
Pt has appt 03/20/2015, will need lab orders  Also, needs refill on allopurinol (ZYLOPRIM) 100 MG tablet, pravastatin (PRAVACHOL) 80 MG tablet, & torsemide (DEMADEX) 20 MG tablet  Explained to patient that Dr. Bary Leriche schedule is booked up and that I would ask for refills until she can be seen, Please advise  (pt asked for Tuesday 03/03/15 but Dr. Nicki Reaper only has sameday sick visits available and only a few of those)

## 2015-02-27 MED ORDER — ALLOPURINOL 100 MG PO TABS
100.0000 mg | ORAL_TABLET | Freq: Every day | ORAL | Status: DC
Start: 2015-02-27 — End: 2015-03-20

## 2015-02-27 MED ORDER — TORSEMIDE 20 MG PO TABS: 20.0000 mg | ORAL_TABLET | Freq: Every morning | ORAL | Status: DC

## 2015-02-27 MED ORDER — PRAVASTATIN SODIUM 80 MG PO TABS: 80.0000 mg | ORAL_TABLET | Freq: Every day | ORAL | Status: DC

## 2015-02-27 NOTE — Telephone Encounter (Signed)
Blood work ordered in [employer]. Rxs sent electronically to pharmacy. Patient notified.

## 2015-03-03 ENCOUNTER — Other Ambulatory Visit: Payer: Self-pay | Admitting: Family Medicine

## 2015-03-03 NOTE — Telephone Encounter (Signed)
May give this refill +3 additional

## 2015-03-05 DIAGNOSIS — Z79899 Other long term (current) drug therapy: Secondary | ICD-10-CM | POA: Diagnosis not present

## 2015-03-05 DIAGNOSIS — M109 Gout, unspecified: Secondary | ICD-10-CM | POA: Diagnosis not present

## 2015-03-05 DIAGNOSIS — E785 Hyperlipidemia, unspecified: Secondary | ICD-10-CM | POA: Diagnosis not present

## 2015-03-05 DIAGNOSIS — R7309 Other abnormal glucose: Secondary | ICD-10-CM | POA: Diagnosis not present

## 2015-03-06 LAB — HEPATIC FUNCTION PANEL
ALT: 19 IU/L (ref 0–32)
AST: 24 IU/L (ref 0–40)
Albumin: 4.2 g/dL (ref 3.6–4.8)
Alkaline Phosphatase: 70 IU/L (ref 39–117)
Bilirubin Total: 0.5 mg/dL (ref 0.0–1.2)
Bilirubin, Direct: 0.13 mg/dL (ref 0.00–0.40)
Total Protein: 6.3 g/dL (ref 6.0–8.5)

## 2015-03-06 LAB — BASIC METABOLIC PANEL
BUN/Creatinine Ratio: 14 (ref 11–26)
BUN: 13 mg/dL (ref 8–27)
CO2: 28 mmol/L (ref 18–29)
Calcium: 8.9 mg/dL (ref 8.7–10.3)
Chloride: 98 mmol/L (ref 97–108)
Creatinine, Ser: 0.95 mg/dL (ref 0.57–1.00)
GFR calc Af Amer: 71 mL/min/{1.73_m2} (ref >59)
GFR calc non Af Amer: 62 mL/min/{1.73_m2} (ref >59)
Glucose: 94 mg/dL (ref 65–99)
Potassium: 3.4 mmol/L — ABNORMAL LOW (ref 3.5–5.2)
Sodium: 144 mmol/L (ref 134–144)

## 2015-03-06 LAB — HEMOGLOBIN A1C
Est. average glucose Bld gHb Est-mCnc: 128 mg/dL
Hgb A1c MFr Bld: 6.1 % — ABNORMAL HIGH (ref 4.8–5.6)

## 2015-03-06 LAB — LIPID PANEL
Chol/HDL Ratio: 3.5 ratio units (ref 0.0–4.4)
Cholesterol, Total: 178 mg/dL (ref 100–199)
HDL: 51 mg/dL (ref >39)
LDL Calculated: 83 mg/dL (ref 0–99)
Triglycerides: 220 mg/dL — ABNORMAL HIGH (ref 0–149)
VLDL Cholesterol Cal: 44 mg/dL — ABNORMAL HIGH (ref 5–40)

## 2015-03-06 LAB — URIC ACID: Uric Acid: 7.2 mg/dL — ABNORMAL HIGH (ref 2.5–7.1)

## 2015-03-20 ENCOUNTER — Ambulatory Visit (INDEPENDENT_AMBULATORY_CARE_PROVIDER_SITE_OTHER): Payer: Medicare Other | Admitting: Family Medicine

## 2015-03-20 ENCOUNTER — Encounter: Payer: Self-pay | Admitting: Family Medicine

## 2015-03-20 VITALS — BP 106/68 | Ht 62.0 in | Wt 145.0 lb

## 2015-03-20 DIAGNOSIS — E876 Hypokalemia: Secondary | ICD-10-CM | POA: Diagnosis not present

## 2015-03-20 DIAGNOSIS — I10 Essential (primary) hypertension: Secondary | ICD-10-CM | POA: Diagnosis not present

## 2015-03-20 DIAGNOSIS — E7800 Pure hypercholesterolemia, unspecified: Secondary | ICD-10-CM | POA: Diagnosis not present

## 2015-03-20 DIAGNOSIS — E785 Hyperlipidemia, unspecified: Secondary | ICD-10-CM

## 2015-03-20 DIAGNOSIS — R739 Hyperglycemia, unspecified: Secondary | ICD-10-CM

## 2015-03-20 DIAGNOSIS — Z23 Encounter for immunization: Secondary | ICD-10-CM

## 2015-03-20 DIAGNOSIS — M1000 Idiopathic gout, unspecified site: Secondary | ICD-10-CM

## 2015-03-20 MED ORDER — POTASSIUM CHLORIDE ER 10 MEQ PO TBCR: 10.0000 meq | EXTENDED_RELEASE_TABLET | Freq: Every day | ORAL | Status: DC

## 2015-03-20 MED ORDER — ALLOPURINOL 100 MG PO TABS: ORAL_TABLET | ORAL | Status: DC

## 2015-03-20 NOTE — Patient Instructions (Addendum)
Diabetes Mellitus and Food It is important for you to manage your blood sugar (glucose) level. Your blood glucose level can be greatly affected by what you eat. Eating healthier foods in the appropriate amounts throughout the day at about the same time each day will help you control your blood glucose level. It can also help slow or prevent worsening of your diabetes mellitus. Healthy eating may even help you improve the level of your blood pressure and reach or maintain a healthy weight.  General recommendations for healthful eating and cooking habits include:  Eating meals and snacks regularly. Avoid going long periods of time without eating to lose weight.  Eating a diet that consists mainly of plant-based foods, such as fruits, vegetables, nuts, legumes, and whole grains.  Using low-heat cooking methods, such as baking, instead of high-heat cooking methods, such as deep frying. Work with your dietitian to make sure you understand how to use the Nutrition Facts information on food labels. HOW CAN FOOD AFFECT ME? Carbohydrates Carbohydrates affect your blood glucose level more than any other type of food. Your dietitian will help you determine how many carbohydrates to eat at each meal and teach you how to count carbohydrates. Counting carbohydrates is important to keep your blood glucose at a healthy level, especially if you are using insulin or taking certain medicines for diabetes mellitus. Alcohol Alcohol can cause sudden decreases in blood glucose (hypoglycemia), especially if you use insulin or take certain medicines for diabetes mellitus. Hypoglycemia can be a life-threatening condition. Symptoms of hypoglycemia (sleepiness, dizziness, and disorientation) are similar to symptoms of having too much alcohol.  If your health care provider has given you approval to drink alcohol, do so in moderation and use the following guidelines:  Women should not have more than one drink per day, and men  should not have more than two drinks per day. One drink is equal to:  12 oz of beer.  5 oz of wine.  1 oz of hard liquor.  Do not drink on an empty stomach.  Keep yourself hydrated. Have water, diet soda, or unsweetened iced tea.  Regular soda, juice, and other mixers might contain a lot of carbohydrates and should be counted. WHAT FOODS ARE NOT RECOMMENDED? As you make food choices, it is important to remember that all foods are not the same. Some foods have fewer nutrients per serving than other foods, even though they might have the same number of calories or carbohydrates. It is difficult to get your body what it needs when you eat foods with fewer nutrients. Examples of foods that you should avoid that are high in calories and carbohydrates but low in nutrients include:  Trans fats (most processed foods list trans fats on the Nutrition Facts label).  Regular soda.  Juice.  Candy.  Sweets, such as cake, pie, doughnuts, and cookies.  Fried foods. WHAT FOODS CAN I EAT? Eat nutrient-rich foods, which will nourish your body and keep you healthy. The food you should eat also will depend on several factors, including:  The calories you need.  The medicines you take.  Your weight.  Your blood glucose level.  Your blood pressure level.  Your cholesterol level. You should eat a variety of foods, including:  Protein.  Lean cuts of meat.  Proteins low in saturated fats, such as fish, egg whites, and beans. Avoid processed meats.  Fruits and vegetables.  Fruits and vegetables that may help control blood glucose levels, such as apples, mangoes, and   yams.  Dairy products.  Choose fat-free or low-fat dairy products, such as milk, yogurt, and cheese.  Grains, bread, pasta, and rice.  Choose whole grain products, such as multigrain bread, whole oats, and brown rice. These foods may help control blood pressure.  Fats.  Foods containing healthful fats, such as nuts,  avocado, olive oil, canola oil, and fish. DOES EVERYONE WITH DIABETES MELLITUS HAVE THE SAME MEAL PLAN? Because every person with diabetes mellitus is different, there is not one meal plan that works for everyone. It is very important that you meet with a dietitian who will help you create a meal plan that is just right for you.   This information is not intended to replace advice given to you by your health care provider. Make sure you discuss any questions you have with your health care provider.   Document Released: 02/17/2005 Document Revised: 06/13/2014 Document Reviewed: 04/19/2013 Elsevier Interactive Patient Education 2016 [employer].  Results for orders placed or performed in visit on 02/26/15  Lipid panel  Result Value Ref Range   Cholesterol, Total 178 100 - 199 mg/dL   Triglycerides 220 (H) 0 - 149 mg/dL   HDL 51 >39 mg/dL   VLDL Cholesterol Cal 44 (H) 5 - 40 mg/dL   LDL Calculated 83 0 - 99 mg/dL   Chol/HDL Ratio 3.5 0.0 - 4.4 ratio units  Hepatic function panel  Result Value Ref Range   Total Protein 6.3 6.0 - 8.5 g/dL   Albumin 4.2 3.6 - 4.8 g/dL   Bilirubin Total 0.5 0.0 - 1.2 mg/dL   Bilirubin, Direct 0.13 0.00 - 0.40 mg/dL   Alkaline Phosphatase 70 39 - 117 IU/L   AST 24 0 - 40 IU/L   ALT 19 0 - 32 IU/L  Basic metabolic panel  Result Value Ref Range   Glucose 94 65 - 99 mg/dL   BUN 13 8 - 27 mg/dL   Creatinine, Ser 0.95 0.57 - 1.00 mg/dL   GFR calc non Af Amer 62 >59 mL/min/1.73   GFR calc Af Amer 71 >59 mL/min/1.73   BUN/Creatinine Ratio 14 11 - 26   Sodium 144 134 - 144 mmol/L   Potassium 3.4 (L) 3.5 - 5.2 mmol/L   Chloride 98 97 - 108 mmol/L   CO2 28 18 - 29 mmol/L   Calcium 8.9 8.7 - 10.3 mg/dL  Hemoglobin A1c  Result Value Ref Range   Hgb A1c MFr Bld 6.1 (H) 4.8 - 5.6 %   Est. average glucose Bld gHb Est-mCnc 128 mg/dL  Uric acid  Result Value Ref Range   Uric Acid 7.2 (H) 2.5 - 7.1 mg/dL

## 2015-03-20 NOTE — Progress Notes (Signed)
   Subjective:    Patient ID: Terri Douglas, female    DOB: [birth year], 69 y.o.   MRN: [id number]  Hyperlipidemia This is a chronic problem. The current episode started more than 1 year ago. Pertinent negatives include no chest pain. There are no compliance problems (walks one mile a day, doing better with diet, drinking more water).    Still having trouble with constipation. Takes laxative every other night. Tried otc meds. Patient relates that she has tried MiraLAX in the past currently now just taking a laxative. Wonders if she needs to be doing anything different.  She denies any gout flareups currently. She does try to take the medicine as prescribed denies any problems with it.  Does have insomnia uses Xanax for that does not cause excessive drowsiness seems to help her  Patient with hypertension takes her medicine as directed watch his diet closely.  Patient recently had lab work completed we went over this period cholesterol looks good potassium slightly low uric acid level slightly elevated.  Pt stopped her vitamins and started taking healthy hair and vitality supplement 2 a day. Would like to see if she needs to be on any other vitamins.   Flu vaccine today.    Review of Systems  Constitutional: Negative for activity change, appetite change and fatigue.  HENT: Negative for congestion.   Respiratory: Negative for cough.   Cardiovascular: Negative for chest pain.  Gastrointestinal: Negative for abdominal pain.  Endocrine: Negative for polydipsia and polyphagia.  Neurological: Negative for weakness.  Psychiatric/Behavioral: Negative for confusion.       Objective:   Physical Exam  Constitutional: She appears well-nourished. No distress.  Cardiovascular: Normal rate, regular rhythm and normal heart sounds.   No murmur heard. Pulmonary/Chest: Effort normal and breath sounds normal. No respiratory distress.  Musculoskeletal: She exhibits no edema.  Lymphadenopathy:    She  has no cervical adenopathy.  Neurological: She is alert. She exhibits normal muscle tone.  Psychiatric: Her behavior is normal.  Vitals reviewed.   25 minutes was spent with the patient. Greater than half the time was spent in discussion and answering questions and counseling regarding the issues that the patient came in for today.       Assessment & Plan:  1. Essential hypertension Blood pressure good control continue current measures. Watch diet closely. Try to fit and walking 25-30 minutes 4 times a week. - Basic metabolic panel  2. High cholesterol Continue pravastatin watch diet closely. Recheck lab work 6 months  3. Hyperlipidemia Ditto  4. Hyperglycemia Prediabetes. Watch diet closely minimize starches exercise on a regular basis.  5. Idiopathic gout, unspecified chronicity, unspecified site Level is higher than what would like it to be recommend low purine diet increase medication take 2 daily - Uric acid  6. Encounter for immunization Flu shot today  7. Hypokalemia Abdomen potassium recheck lab work in 3 weeks  Recommend monitoring bowel movements over the next couple weeks might consider Amitiza depending on what we find patient up-to-date on colonoscopy

## 2015-04-09 ENCOUNTER — Other Ambulatory Visit: Payer: Self-pay | Admitting: Family Medicine

## 2015-04-24 ENCOUNTER — Telehealth: Payer: Self-pay | Admitting: Family Medicine

## 2015-04-24 NOTE — Telephone Encounter (Signed)
Pt dropped off a bowel diary. Message in box.

## 2015-04-27 NOTE — Telephone Encounter (Signed)
Haven't seen  this yet

## 2015-04-27 NOTE — Telephone Encounter (Signed)
Message was sent back in red folder on Friday.

## 2015-04-27 NOTE — Telephone Encounter (Signed)
It was at nurse's station, Dewitt Hoes put it in your pile just now for you to see.

## 2015-04-27 NOTE — Telephone Encounter (Signed)
Haven't seen please have someone look for it thank you

## 2015-04-28 ENCOUNTER — Ambulatory Visit (INDEPENDENT_AMBULATORY_CARE_PROVIDER_SITE_OTHER): Payer: Medicare Other | Admitting: Family Medicine

## 2015-04-28 ENCOUNTER — Encounter: Payer: Self-pay | Admitting: Family Medicine

## 2015-04-28 VITALS — BP 134/68 | Temp 98.7°F | Ht 62.0 in | Wt 139.4 lb

## 2015-04-28 DIAGNOSIS — M5431 Sciatica, right side: Secondary | ICD-10-CM | POA: Diagnosis not present

## 2015-04-28 DIAGNOSIS — J019 Acute sinusitis, unspecified: Secondary | ICD-10-CM

## 2015-04-28 DIAGNOSIS — H6502 Acute serous otitis media, left ear: Secondary | ICD-10-CM | POA: Diagnosis not present

## 2015-04-28 MED ORDER — HYDROCODONE-HOMATROPINE 5-1.5 MG/5ML PO SYRP: 5.0000 mL | ORAL_SOLUTION | Freq: Four times a day (QID) | ORAL | Status: DC | PRN

## 2015-04-28 MED ORDER — AMOXICILLIN-POT CLAVULANATE 875-125 MG PO TABS: 1.0000 | ORAL_TABLET | Freq: Two times a day (BID) | ORAL | Status: DC

## 2015-04-28 NOTE — Progress Notes (Signed)
   Subjective:    Patient ID: Terri Douglas, female    DOB: 1946/01/17, 69 y.o.   MRN: [id number]  Cough This is a new problem. The current episode started in the past 7 days. The problem has been gradually worsening. The problem occurs every few minutes. The cough is non-productive. Associated symptoms include ear pain, headaches, myalgias and rhinorrhea. Associated symptoms comments: Hoarse voice,. Nothing aggravates the symptoms. She has tried OTC cough suppressant for the symptoms. The treatment provided no relief.   She relates left ear pain and discomfort coughing keeping her awake at night  Patient also has c/o of right leg pain, when laying on her right side that tingles and goes down to her ankle. She relates when she is laying on her left side that her right side causes pain and discomfort. But when she changes sides the pain goes away this been going on for approximately 3 weeks no weakness in the legs    Patient also has c/o of upper abdominal pain. She points to an area which is actually the right lower ribs at times it feels sore she denies any severe pain no vomiting with it no indigestion   Review of Systems  HENT: Positive for ear pain and rhinorrhea.   Respiratory: Positive for cough.   Musculoskeletal: Positive for myalgias.  Neurological: Positive for headaches.       Objective:   Physical Exam  Constitutional: She appears well-developed.  HENT:  Head: Normocephalic.  Nose: Nose normal.  Mouth/Throat: Oropharynx is clear and moist. No oropharyngeal exudate.  Right otitis media  Neck: Neck supple.  Cardiovascular: Normal rate and normal heart sounds.   No murmur heard. Pulmonary/Chest: Effort normal and breath sounds normal. She has no wheezes.  Lymphadenopathy:    She has no cervical adenopathy.  Skin: Skin is warm and dry.  Nursing note and vitals reviewed.         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

## 2015-04-28 NOTE — Telephone Encounter (Signed)
I discussed this with the patient at her office visit

## 2015-05-08 DIAGNOSIS — M1000 Idiopathic gout, unspecified site: Secondary | ICD-10-CM | POA: Diagnosis not present

## 2015-05-08 DIAGNOSIS — I10 Essential (primary) hypertension: Secondary | ICD-10-CM | POA: Diagnosis not present

## 2015-05-09 LAB — URIC ACID: Uric Acid: 4.5 mg/dL (ref 2.5–7.1)

## 2015-05-09 LAB — BASIC METABOLIC PANEL
BUN/Creatinine Ratio: 14 (ref 11–26)
BUN: 13 mg/dL (ref 8–27)
CO2: 28 mmol/L (ref 18–29)
Calcium: 9.2 mg/dL (ref 8.7–10.3)
Chloride: 98 mmol/L (ref 97–106)
Creatinine, Ser: 0.94 mg/dL (ref 0.57–1.00)
GFR calc Af Amer: 72 mL/min/{1.73_m2} (ref >59)
GFR calc non Af Amer: 63 mL/min/{1.73_m2} (ref >59)
Glucose: 93 mg/dL (ref 65–99)
Potassium: 3.6 mmol/L (ref 3.5–5.2)
Sodium: 142 mmol/L (ref 136–144)

## 2015-05-10 ENCOUNTER — Encounter: Payer: Self-pay | Admitting: Family Medicine

## 2015-05-19 ENCOUNTER — Ambulatory Visit (INDEPENDENT_AMBULATORY_CARE_PROVIDER_SITE_OTHER): Payer: Medicare Other | Admitting: Family Medicine

## 2015-05-19 ENCOUNTER — Encounter: Payer: Self-pay | Admitting: Family Medicine

## 2015-05-19 VITALS — BP 104/76 | Temp 98.4°F | Ht 62.0 in | Wt 138.2 lb

## 2015-05-19 DIAGNOSIS — J019 Acute sinusitis, unspecified: Secondary | ICD-10-CM

## 2015-05-19 DIAGNOSIS — R142 Eructation: Secondary | ICD-10-CM | POA: Diagnosis not present

## 2015-05-19 DIAGNOSIS — B9689 Other specified bacterial agents as the cause of diseases classified elsewhere: Secondary | ICD-10-CM

## 2015-05-19 DIAGNOSIS — R1011 Right upper quadrant pain: Secondary | ICD-10-CM

## 2015-05-19 MED ORDER — LEVOFLOXACIN 500 MG PO TABS: 500.0000 mg | ORAL_TABLET | Freq: Every day | ORAL | Status: DC

## 2015-05-19 MED ORDER — CEFTRIAXONE SODIUM 1 G IJ SOLR
500.0000 mg | Freq: Once | INTRAMUSCULAR | Status: AC
  Administered 2015-05-19: 500 mg via INTRAMUSCULAR

## 2015-05-19 NOTE — Progress Notes (Signed)
   Subjective:    Patient ID: Terri Douglas, female    DOB: August 13, 1945, 69 y.o.   MRN: [id number]  Sinusitis This is a new problem. The current episode started 1 to 4 weeks ago. The problem is unchanged. There has been no fever. The pain is moderate. Associated symptoms include congestion, coughing and a sore throat. Pertinent negatives include no ear pain or shortness of breath. Past treatments include antibiotics. The treatment provided no relief.   Patient states that she has abdominal pain and belching all the time also.  Patient relates intermittent right upper quadrant tenderness more so when she bends over certain movements also at times when she eats she belches a lot and feels a slight fullness and nausea in the epigastrium denies reflux symptoms  Review of Systems  Constitutional: Negative for fever and activity change.  HENT: Positive for congestion, rhinorrhea and sore throat. Negative for ear pain.   Eyes: Negative for discharge.  Respiratory: Positive for cough. Negative for shortness of breath and wheezing.   Cardiovascular: Negative for chest pain.       Objective:   Physical Exam  Constitutional: She appears well-developed.  HENT:  Head: Normocephalic.  Nose: Nose normal.  Mouth/Throat: Oropharynx is clear and moist. No oropharyngeal exudate.  Neck: Neck supple.  Cardiovascular: Normal rate and normal heart sounds.   No murmur heard. Pulmonary/Chest: Effort normal and breath sounds normal. She has no wheezes.  Lymphadenopathy:    She has no cervical adenopathy.  Skin: Skin is warm and dry.  Nursing note and vitals reviewed.         Assessment & Plan:  Sinusitis antibiotics prescribed warning signs discussed follow-up if problems Underlying viral illness will take some time but gradually we'll get better  Intermittent right upper quadrant tenderness I think this is more musculoskeletal but could be gallbladder ultrasound recommended. Patient instructed to  follow-up if progressive troubles Significant belching could be related to gallbladder could be related to what the patient is taking in eating and drinking

## 2015-05-21 ENCOUNTER — Other Ambulatory Visit: Payer: Self-pay | Admitting: Family Medicine

## 2015-05-21 ENCOUNTER — Other Ambulatory Visit (HOSPITAL_COMMUNITY): Payer: BLUE CROSS/BLUE SHIELD

## 2015-05-21 DIAGNOSIS — R1011 Right upper quadrant pain: Secondary | ICD-10-CM

## 2015-05-21 DIAGNOSIS — R1030 Lower abdominal pain, unspecified: Secondary | ICD-10-CM

## 2015-05-22 ENCOUNTER — Ambulatory Visit (HOSPITAL_COMMUNITY): Admission: RE | Admit: 2015-05-22 | Payer: Medicare Other | Source: Ambulatory Visit

## 2015-05-26 ENCOUNTER — Ambulatory Visit (HOSPITAL_COMMUNITY)
Admission: RE | Admit: 2015-05-26 | Discharge: 2015-05-26 | Disposition: A | Discharge status: Home / Self Care | Payer: Medicare Other | Source: Ambulatory Visit | Attending: Family Medicine | Admitting: Family Medicine

## 2015-05-26 ENCOUNTER — Ambulatory Visit (HOSPITAL_COMMUNITY): Payer: Medicare Other

## 2015-05-26 DIAGNOSIS — K8020 Calculus of gallbladder without cholecystitis without obstruction: Secondary | ICD-10-CM | POA: Insufficient documentation

## 2015-05-26 DIAGNOSIS — R1011 Right upper quadrant pain: Secondary | ICD-10-CM | POA: Diagnosis not present

## 2015-05-28 ENCOUNTER — Other Ambulatory Visit: Payer: Self-pay

## 2015-05-28 DIAGNOSIS — R1011 Right upper quadrant pain: Secondary | ICD-10-CM | POA: Diagnosis not present

## 2015-05-29 LAB — CBC WITH DIFFERENTIAL/PLATELET
Basophils Absolute: 0 10*3/uL (ref 0.0–0.2)
Basos: 0 %
EOS (ABSOLUTE): 0.1 10*3/uL (ref 0.0–0.4)
Eos: 1 %
Hematocrit: 40.3 % (ref 34.0–46.6)
Hemoglobin: 13 g/dL (ref 11.1–15.9)
Immature Grans (Abs): 0 10*3/uL (ref 0.0–0.1)
Immature Granulocytes: 0 %
Lymphocytes Absolute: 1.4 10*3/uL (ref 0.7–3.1)
Lymphs: 32 %
MCH: 28.7 pg (ref 26.6–33.0)
MCHC: 32.3 g/dL (ref 31.5–35.7)
MCV: 89 fL (ref 79–97)
Monocytes Absolute: 0.3 10*3/uL (ref 0.1–0.9)
Monocytes: 7 %
Neutrophils Absolute: 2.6 10*3/uL (ref 1.4–7.0)
Neutrophils: 60 %
Platelets: 341 10*3/uL (ref 150–379)
RBC: 4.53 x10E6/uL (ref 3.77–5.28)
RDW: 13.2 % (ref 12.3–15.4)
WBC: 4.4 10*3/uL (ref 3.4–10.8)

## 2015-05-29 LAB — LIPASE: Lipase: 31 U/L (ref 0–59)

## 2015-05-29 LAB — HEPATIC FUNCTION PANEL
ALT: 10 IU/L (ref 0–32)
AST: 18 IU/L (ref 0–40)
Albumin: 4 g/dL (ref 3.6–4.8)
Alkaline Phosphatase: 69 IU/L (ref 39–117)
Bilirubin Total: 0.3 mg/dL (ref 0.0–1.2)
Bilirubin, Direct: 0.07 mg/dL (ref 0.00–0.40)
Total Protein: 5.7 g/dL — ABNORMAL LOW (ref 6.0–8.5)

## 2015-06-03 ENCOUNTER — Telehealth: Payer: Self-pay | Admitting: Family Medicine

## 2015-06-03 DIAGNOSIS — Z779 Other contact with and (suspected) exposures hazardous to health: Secondary | ICD-10-CM | POA: Diagnosis not present

## 2015-06-03 DIAGNOSIS — Z124 Encounter for screening for malignant neoplasm of cervix: Secondary | ICD-10-CM | POA: Diagnosis not present

## 2015-06-03 DIAGNOSIS — R319 Hematuria, unspecified: Secondary | ICD-10-CM | POA: Diagnosis not present

## 2015-06-03 DIAGNOSIS — Z6824 Body mass index (BMI) 24.0-24.9, adult: Secondary | ICD-10-CM | POA: Diagnosis not present

## 2015-06-03 NOTE — Telephone Encounter (Signed)
Pt called wanting to speak with one of Dr. Bary Leriche nurses, pt is scheduled to have gall bladder surgery and is wanting to make sure that the Dr. She is scheduled to see is one that Dr. Nicki Reaper would recommend or want to do surgery on him if he was needing it. The Dr. She is scheduled with is Dr. Alvino Blood. Please advise.

## 2015-06-03 NOTE — Telephone Encounter (Signed)
Called and spoke with patient and patient informed me that she has an appointment with Dr.Kissinger on January 5th and wants to know that he is a Tax adviser that Dr.Scott recommends for this surgery. Patient stated that she would just like to know more about the doctor that she is being referred to.

## 2015-06-03 NOTE — Telephone Encounter (Signed)
Dr.Luke Kinsinger -this doctor has his training in general surgery. He did his general surgery training at [place] in [place]. This type of surgery is well within his training. He even has an additional year of fellowship specialty training in. After surgery. He is one of the younger doctors at [facility] surgery -he does have good training and qualifications. I would feel good with him doing the surgery. I do not know him personally.( If she desires one of the order surgeons who has been in [place] longer it may take a few more weeks to get in)

## 2015-06-04 NOTE — Telephone Encounter (Signed)
Called patient and informed her per Dr.Scott Luking-Dr.Luke Kinsinger -this doctor has his training in general surgery. He did his general surgery training at [facility] in [place]. This type of surgery is well within his training. He even has an additional year of fellowship specialty training in. After surgery. He is one of the younger doctors at [facility] surgery -he does have good training and qualifications. I would feel good with him doing the surgery. I do not know him personally.Advised her that if she prefers an older Psychologist, sport and exercise who has been in [place] longer it may take a few more weeks to get in. Patient verbalized understanding and stated that she will keep Dr.Luke Kinsinger just wanted to make sure Dr.Scott recommends him.

## 2015-06-11 ENCOUNTER — Ambulatory Visit: Payer: Self-pay | Admitting: General Surgery

## 2015-06-11 DIAGNOSIS — K8020 Calculus of gallbladder without cholecystitis without obstruction: Secondary | ICD-10-CM | POA: Diagnosis not present

## 2015-06-23 ENCOUNTER — Encounter (HOSPITAL_COMMUNITY): Payer: Self-pay

## 2015-06-23 ENCOUNTER — Encounter (HOSPITAL_COMMUNITY)
Admission: RE | Admit: 2015-06-23 | Discharge: 2015-06-23 | Disposition: A | Discharge status: Home / Self Care | Payer: Medicare Other | Source: Ambulatory Visit | Attending: General Surgery | Admitting: General Surgery

## 2015-06-23 DIAGNOSIS — K8010 Calculus of gallbladder with chronic cholecystitis without obstruction: Secondary | ICD-10-CM | POA: Diagnosis not present

## 2015-06-23 DIAGNOSIS — M1990 Unspecified osteoarthritis, unspecified site: Secondary | ICD-10-CM | POA: Diagnosis not present

## 2015-06-23 DIAGNOSIS — I252 Old myocardial infarction: Secondary | ICD-10-CM | POA: Diagnosis not present

## 2015-06-23 DIAGNOSIS — I10 Essential (primary) hypertension: Secondary | ICD-10-CM | POA: Diagnosis not present

## 2015-06-23 DIAGNOSIS — K219 Gastro-esophageal reflux disease without esophagitis: Secondary | ICD-10-CM | POA: Diagnosis not present

## 2015-06-23 HISTORY — DX: Gastro-esophageal reflux disease without esophagitis: K21.9

## 2015-06-23 HISTORY — DX: Unspecified osteoarthritis, unspecified site: M19.90

## 2015-06-23 LAB — COMPREHENSIVE METABOLIC PANEL
ALT: 15 U/L (ref 14–54)
AST: 24 U/L (ref 15–41)
Albumin: 4.1 g/dL (ref 3.5–5.0)
Alkaline Phosphatase: 61 U/L (ref 38–126)
Anion gap: 9 (ref 5–15)
BUN: 9 mg/dL (ref 6–20)
CO2: 30 mmol/L (ref 22–32)
Calcium: 9.2 mg/dL (ref 8.9–10.3)
Chloride: 102 mmol/L (ref 101–111)
Creatinine, Ser: 0.96 mg/dL (ref 0.44–1.00)
GFR calc Af Amer: >60 mL/min (ref >60)
GFR calc non Af Amer: 59 mL/min — ABNORMAL LOW (ref >60)
Glucose, Bld: 92 mg/dL (ref 65–99)
Potassium: 3.6 mmol/L (ref 3.5–5.1)
Sodium: 141 mmol/L (ref 135–145)
Total Bilirubin: 0.4 mg/dL (ref 0.3–1.2)
Total Protein: 7.1 g/dL (ref 6.5–8.1)

## 2015-06-23 LAB — CBC WITH DIFFERENTIAL/PLATELET
Basophils Absolute: 0 10*3/uL (ref 0.0–0.1)
Basophils Relative: 0 %
Eosinophils Absolute: 0 10*3/uL (ref 0.0–0.7)
Eosinophils Relative: 1 %
HCT: 44.2 % (ref 36.0–46.0)
Hemoglobin: 13.9 g/dL (ref 12.0–15.0)
Lymphocytes Relative: 32 %
Lymphs Abs: 1.6 10*3/uL (ref 0.7–4.0)
MCH: 28.8 pg (ref 26.0–34.0)
MCHC: 31.4 g/dL (ref 30.0–36.0)
MCV: 91.7 fL (ref 78.0–100.0)
Monocytes Absolute: 0.4 10*3/uL (ref 0.1–1.0)
Monocytes Relative: 7 %
Neutro Abs: 2.9 10*3/uL (ref 1.7–7.7)
Neutrophils Relative %: 60 %
Platelets: 347 10*3/uL (ref 150–400)
RBC: 4.82 MIL/uL (ref 3.87–5.11)
RDW: 13.3 % (ref 11.5–15.5)
WBC: 4.9 10*3/uL (ref 4.0–10.5)

## 2015-06-23 NOTE — Pre-Procedure Instructions (Signed)
Terri Douglas  06/23/2015      [place], [address] [place] [address] Phone: [phone] Fax: [phone]    Your procedure is scheduled on January 18th, Wednesday   Report to [facility] Admitting at 8:30 am             (Surgery time 10:30 am to 12:00 noon)  Call this number if you have problems the morning of surgery:  [phone]   Remember:  Do not eat food or drink liquids after midnight Tuesday.  Take these medicines the morning of surgery with A SIP OF WATER : Xanax                          (Stop taking aspirin, anti-inflammatories, herbal supplements & vitamins 4-5 days prior to surgery)   Do not wear jewelry, make-up or nail polish.  Do not wear lotions, powders, or perfumes.  You may NOT wear deodorant the day of surgery.  Do not shave underarms & legs 48 hours prior to surgery.     Do not bring valuables to the hospital.  [facility] is not responsible for any belongings or valuables.  Contacts, dentures or bridgework may not be worn into surgery.  Leave your suitcase in the car.  After surgery it may be brought to your room. For patients admitted to the hospital, discharge time will be determined by your treatment team.  Patients discharged the day of surgery will not be allowed to drive home.   Name and phone number of your driver:     Please read over the following fact sheets that you were given. Pain Booklet, Coughing and Deep Breathing and Surgical Site Infection Prevention

## 2015-06-23 NOTE — Progress Notes (Signed)
PCP is Dr. Sallee Lange in [place]  She states that around 2004, she was having some chest tightness, went to hospital in [place], was there a day or so, and then was sent here to [facility].   Records show that Dr. Wilhemina Cash saw her here.   Patient then states she did have a heart cath, and was told her had "mild MI".  Had been on plavix back then, results were 'normal'.  Per the patient, she was "cut loose" and has not been back to see cardio since. However, she did report that she's had some 'fluttering' the past couple of days, only lasts for a second or two and believes its anxiety related, and does not in any way feel like the discomfort she had back in 2004.  Patient states that back then, she ws going through a divorce and was really stressed.

## 2015-06-23 NOTE — Progress Notes (Signed)
Anesthesia Chart Review: Patient is a 70 year old female scheduled for cholecystectomy tomorrow by Dr. Kieth Brightly.   History includes non-smoker, post-operative N/V, HTN, hypercholesterolemia, pre-diabetes, GERD, arthritis, neck surgery X 3. She reported "mild MI" in 2005 with review of records in [place] indicating that she had pleuritic chest pain ford 6-7 weeks with cardiac markers at [facility] showing "very mild elevation of her troponin and CK-MB" (troponin 0.02-0.42,CK-MB peak 5.9). She was seen by cardiologist Dr. Scarlett Presto and transferred to [facility] where she underwent cardiac cath on 04/28/04 (Dr. Elta Guadeloupe Pulsipher) that showed no significant CAD (see below). According to discharge summary from Dr. Sabino Snipes, he thought her history was most suggestive of musculoskeletal injury with two normal troponins and then a troponin that was slightly elevated. We will assume that she had an acute coronary syndrome with the resolution of thrombus." Reportedly she had follow-up with Dr. Wilhemina Cash who ultimately discharged her back to Dr. Wolfgang Phoenix.  PCP is Dr. Sallee Lange. His office is closed today. Dr. Amie Portland office did send Dr. Wolfgang Phoenix notes regarding plans for surgery (scanned under Media tab).   Meds include allopurinol, Xanax, ASA 81 mg, Estrace, K-dur, pravastatin, torsemide, Bosley healthy hair supplement.   04/28/04 Cardiac cath: IMPRESSION: 1. Normal left ventricular systolic function. 2. No significant coronary artery disease identified (Normal LM, LAD with minor luminal irregularities in the proximal vessel, 20% mid LAD, luminal irregularities in the OM1, 20% proximal RCA.Marland Kitchen PLAN: Alternative etiologies for this patient's chest pain will be investigated. We will check a D-dimer as a screening test for pulmonary embolism and threshold for a spiral CT scan.  06/23/15 EKG: NSR, cannot rule out anterior infarct (age undetermined). There is no comparison tracing in Muse or Epic. Her PCP office is closed  today. She denied chest pain today. She does get occasional fluttering that only lasts for a few seconds and is not associated with significant symptoms.  06/18/14 CXR: IMPRESSION: No acute cardiopulmonary findings.  Preoperative labs noted. A1c was 6.1 on 03/05/15.  Patient had minimal CAD by cath in 2005. She denied chest pain at her PAT visit. No significant ST/T wave changes on today's EKG. If no acute changes then I would anticipate that she could proceed as planned. Chart reviewed with anesthesiologist Dr. Ermalene Postin.  [facility]/Anesthesiology Phone [phone] 06/23/2015 2:26 PM

## 2015-06-24 ENCOUNTER — Encounter (HOSPITAL_COMMUNITY): Payer: Self-pay | Admitting: Certified Registered"

## 2015-06-24 ENCOUNTER — Ambulatory Visit (HOSPITAL_COMMUNITY)
Admission: RE | Admit: 2015-06-24 | Discharge: 2015-06-24 | Disposition: A | Discharge status: Home / Self Care | Payer: Medicare Other | Source: Ambulatory Visit | Attending: General Surgery | Admitting: General Surgery

## 2015-06-24 ENCOUNTER — Ambulatory Visit (HOSPITAL_COMMUNITY): Payer: Medicare Other | Admitting: Certified Registered"

## 2015-06-24 ENCOUNTER — Encounter (HOSPITAL_COMMUNITY)
Admission: RE | Disposition: A | Discharge status: Home / Self Care | Payer: Self-pay | Source: Ambulatory Visit | Attending: General Surgery

## 2015-06-24 ENCOUNTER — Ambulatory Visit (HOSPITAL_COMMUNITY): Payer: Medicare Other | Admitting: Vascular Surgery

## 2015-06-24 DIAGNOSIS — I10 Essential (primary) hypertension: Secondary | ICD-10-CM | POA: Insufficient documentation

## 2015-06-24 DIAGNOSIS — M1990 Unspecified osteoarthritis, unspecified site: Secondary | ICD-10-CM | POA: Diagnosis not present

## 2015-06-24 DIAGNOSIS — I252 Old myocardial infarction: Secondary | ICD-10-CM | POA: Diagnosis not present

## 2015-06-24 DIAGNOSIS — K219 Gastro-esophageal reflux disease without esophagitis: Secondary | ICD-10-CM | POA: Diagnosis not present

## 2015-06-24 DIAGNOSIS — K8010 Calculus of gallbladder with chronic cholecystitis without obstruction: Secondary | ICD-10-CM | POA: Insufficient documentation

## 2015-06-24 DIAGNOSIS — K811 Chronic cholecystitis: Secondary | ICD-10-CM | POA: Diagnosis not present

## 2015-06-24 HISTORY — PX: CHOLECYSTECTOMY: SHX55

## 2015-06-24 LAB — HEMOGLOBIN A1C
Hgb A1c MFr Bld: 6.2 % — ABNORMAL HIGH (ref 4.8–5.6)
Mean Plasma Glucose: 131 mg/dL

## 2015-06-24 SURGERY — LAPAROSCOPIC CHOLECYSTECTOMY
Anesthesia: General | Site: Abdomen

## 2015-06-24 MED ORDER — FENTANYL CITRATE (PF) 250 MCG/5ML IJ SOLN
INTRAMUSCULAR | Status: AC
  Filled 2015-06-24: qty 5

## 2015-06-24 MED ORDER — HYDROMORPHONE HCL 1 MG/ML IJ SOLN: 0.2500 mg | INTRAMUSCULAR | Status: DC | PRN

## 2015-06-24 MED ORDER — ONDANSETRON HCL 4 MG/2ML IJ SOLN
INTRAMUSCULAR | Status: AC
  Filled 2015-06-24: qty 2

## 2015-06-24 MED ORDER — MIDAZOLAM HCL 5 MG/5ML IJ SOLN
INTRAMUSCULAR | Status: DC | PRN
  Administered 2015-06-24: 2 mg via INTRAVENOUS

## 2015-06-24 MED ORDER — BUPIVACAINE-EPINEPHRINE (PF) 0.25% -1:200000 IJ SOLN
INTRAMUSCULAR | Status: AC
  Filled 2015-06-24: qty 30

## 2015-06-24 MED ORDER — SUGAMMADEX SODIUM 200 MG/2ML IV SOLN
INTRAVENOUS | Status: DC | PRN
  Administered 2015-06-24: 200 mg via INTRAVENOUS

## 2015-06-24 MED ORDER — LIDOCAINE HCL (CARDIAC) 20 MG/ML IV SOLN
INTRAVENOUS | Status: DC | PRN
  Administered 2015-06-24: 100 mg via INTRAVENOUS

## 2015-06-24 MED ORDER — MEPERIDINE HCL 25 MG/ML IJ SOLN: 6.2500 mg | INTRAMUSCULAR | Status: DC | PRN

## 2015-06-24 MED ORDER — PROPOFOL 10 MG/ML IV BOLUS
INTRAVENOUS | Status: AC
  Filled 2015-06-24: qty 20

## 2015-06-24 MED ORDER — BUPIVACAINE-EPINEPHRINE 0.25% -1:200000 IJ SOLN
INTRAMUSCULAR | Status: DC | PRN
  Administered 2015-06-24: 30 mL

## 2015-06-24 MED ORDER — OXYCODONE HCL 10 MG PO TABS: 10.0000 mg | ORAL_TABLET | ORAL | Status: DC | PRN

## 2015-06-24 MED ORDER — CEFAZOLIN SODIUM-DEXTROSE 2-3 GM-% IV SOLR
2.0000 g | INTRAVENOUS | Status: AC
  Administered 2015-06-24: 2 g via INTRAVENOUS
  Filled 2015-06-24: qty 50

## 2015-06-24 MED ORDER — HEPARIN SODIUM (PORCINE) 5000 UNIT/ML IJ SOLN
5000.0000 [IU] | Freq: Once | INTRAMUSCULAR | Status: AC
  Administered 2015-06-24: 5000 [IU] via SUBCUTANEOUS
  Filled 2015-06-24: qty 1

## 2015-06-24 MED ORDER — SODIUM CHLORIDE 0.9 % IR SOLN
Status: DC | PRN
  Administered 2015-06-24: 1000 mL

## 2015-06-24 MED ORDER — PROPOFOL 10 MG/ML IV BOLUS
INTRAVENOUS | Status: DC | PRN
  Administered 2015-06-24: 160 mg via INTRAVENOUS

## 2015-06-24 MED ORDER — OXYCODONE HCL 5 MG PO TABS
10.0000 mg | ORAL_TABLET | Freq: Once | ORAL | Status: AC
  Administered 2015-06-24: 10 mg via ORAL

## 2015-06-24 MED ORDER — OXYCODONE HCL 5 MG PO TABS
ORAL_TABLET | ORAL | Status: AC
  Filled 2015-06-24: qty 2

## 2015-06-24 MED ORDER — ROCURONIUM BROMIDE 50 MG/5ML IV SOLN
INTRAVENOUS | Status: AC
  Filled 2015-06-24: qty 1

## 2015-06-24 MED ORDER — VECURONIUM BROMIDE 10 MG IV SOLR
INTRAVENOUS | Status: AC
  Filled 2015-06-24: qty 10

## 2015-06-24 MED ORDER — LACTATED RINGERS IV SOLN
INTRAVENOUS | Status: DC
  Administered 2015-06-24 (×2): via INTRAVENOUS

## 2015-06-24 MED ORDER — MIDAZOLAM HCL 2 MG/2ML IJ SOLN
INTRAMUSCULAR | Status: AC
  Filled 2015-06-24: qty 2

## 2015-06-24 MED ORDER — CHLORHEXIDINE GLUCONATE 4 % EX LIQD: 1.0000 "application " | Freq: Once | CUTANEOUS | Status: DC

## 2015-06-24 MED ORDER — ROCURONIUM BROMIDE 100 MG/10ML IV SOLN
INTRAVENOUS | Status: DC | PRN
  Administered 2015-06-24: 40 mg via INTRAVENOUS

## 2015-06-24 MED ORDER — LIDOCAINE HCL (CARDIAC) 20 MG/ML IV SOLN
INTRAVENOUS | Status: AC
  Filled 2015-06-24: qty 5

## 2015-06-24 MED ORDER — SUGAMMADEX SODIUM 200 MG/2ML IV SOLN
INTRAVENOUS | Status: AC
  Filled 2015-06-24: qty 2

## 2015-06-24 MED ORDER — PROMETHAZINE HCL 25 MG/ML IJ SOLN: 6.2500 mg | INTRAMUSCULAR | Status: DC | PRN

## 2015-06-24 MED ORDER — ONDANSETRON HCL 4 MG/2ML IJ SOLN
INTRAMUSCULAR | Status: DC | PRN
  Administered 2015-06-24: 4 mg via INTRAVENOUS

## 2015-06-24 MED ORDER — FENTANYL CITRATE (PF) 250 MCG/5ML IJ SOLN
INTRAMUSCULAR | Status: DC | PRN
  Administered 2015-06-24 (×2): 50 ug via INTRAVENOUS
  Administered 2015-06-24: 100 ug via INTRAVENOUS

## 2015-06-24 MED ORDER — 0.9 % SODIUM CHLORIDE (POUR BTL) OPTIME
TOPICAL | Status: DC | PRN
  Administered 2015-06-24: 1000 mL

## 2015-06-24 MED ORDER — STERILE WATER FOR INJECTION IJ SOLN
INTRAMUSCULAR | Status: AC
  Filled 2015-06-24: qty 10

## 2015-06-24 SURGICAL SUPPLY — 43 items
APL SKNCLS STERI-STRIP NONHPOA (GAUZE/BANDAGES/DRESSINGS) ×1
APPLIER CLIP ROT 10 11.4 M/L (STAPLE) ×2
APR CLP MED LRG 11.4X10 (STAPLE) ×1
BAG SPEC RTRVL 10 TROC 200 (ENDOMECHANICALS) ×1
BANDAGE ADH SHEER 1  50/CT (GAUZE/BANDAGES/DRESSINGS) ×8 IMPLANT
BENZOIN TINCTURE PRP APPL 2/3 (GAUZE/BANDAGES/DRESSINGS) ×2 IMPLANT
BLADE SURG ROTATE 9660 (MISCELLANEOUS) IMPLANT
CANISTER SUCTION 2500CC (MISCELLANEOUS) ×2 IMPLANT
CHLORAPREP W/TINT 26ML (MISCELLANEOUS) ×2 IMPLANT
CLIP APPLIE ROT 10 11.4 M/L (STAPLE) ×1 IMPLANT
CLSR STERI-STRIP ANTIMIC 1/2X4 (GAUZE/BANDAGES/DRESSINGS) ×1 IMPLANT
COVER SURGICAL LIGHT HANDLE (MISCELLANEOUS) ×2 IMPLANT
DEVICE TROCAR PUNCTURE CLOSURE (ENDOMECHANICALS) IMPLANT
ELECT REM PT RETURN 9FT ADLT (ELECTROSURGICAL) ×2
ELECTRODE REM PT RTRN 9FT ADLT (ELECTROSURGICAL) ×1 IMPLANT
GAUZE SPONGE 2X2 8PLY STRL LF (GAUZE/BANDAGES/DRESSINGS) IMPLANT
GLOVE BIOGEL PI IND STRL 7.0 (GLOVE) ×1 IMPLANT
GLOVE BIOGEL PI IND STRL 7.5 (GLOVE) IMPLANT
GLOVE BIOGEL PI INDICATOR 7.0 (GLOVE) ×2
GLOVE BIOGEL PI INDICATOR 7.5 (GLOVE) ×1
GLOVE ECLIPSE 7.5 STRL STRAW (GLOVE) ×1 IMPLANT
GLOVE SURG SS PI 7.0 STRL IVOR (GLOVE) ×3 IMPLANT
GOWN STRL REUS W/ TWL LRG LVL3 (GOWN DISPOSABLE) ×3 IMPLANT
GOWN STRL REUS W/TWL LRG LVL3 (GOWN DISPOSABLE) ×12
KIT BASIN OR (CUSTOM PROCEDURE TRAY) ×2 IMPLANT
KIT ROOM TURNOVER OR (KITS) ×2 IMPLANT
NS IRRIG 1000ML POUR BTL (IV SOLUTION) ×2 IMPLANT
PAD ARMBOARD 7.5X6 YLW CONV (MISCELLANEOUS) ×2 IMPLANT
POUCH RETRIEVAL ECOSAC 10 (ENDOMECHANICALS) ×1 IMPLANT
POUCH RETRIEVAL ECOSAC 10MM (ENDOMECHANICALS) ×1
SCISSORS LAP 5X35 DISP (ENDOMECHANICALS) ×2 IMPLANT
SET IRRIG TUBING LAPAROSCOPIC (IRRIGATION / IRRIGATOR) ×2 IMPLANT
SLEEVE ENDOPATH XCEL 5M (ENDOMECHANICALS) ×4 IMPLANT
SPECIMEN JAR SMALL (MISCELLANEOUS) ×2 IMPLANT
SPONGE GAUZE 2X2 STER 10/PKG (GAUZE/BANDAGES/DRESSINGS) ×2
STRIP CLOSURE SKIN 1/2X4 (GAUZE/BANDAGES/DRESSINGS) ×3 IMPLANT
SUT MNCRL AB 4-0 PS2 18 (SUTURE) ×2 IMPLANT
TOWEL OR 17X24 6PK STRL BLUE (TOWEL DISPOSABLE) ×2 IMPLANT
TOWEL OR 17X26 10 PK STRL BLUE (TOWEL DISPOSABLE) ×2 IMPLANT
TRAY LAPAROSCOPIC MC (CUSTOM PROCEDURE TRAY) ×2 IMPLANT
TROCAR XCEL 12X100 BLDLESS (ENDOMECHANICALS) ×3 IMPLANT
TROCAR XCEL NON-BLD 5MMX100MML (ENDOMECHANICALS) ×2 IMPLANT
TUBING INSUFFLATION (TUBING) ×2 IMPLANT

## 2015-06-24 NOTE — H&P (Signed)
Terri Douglas is an 70 y.o. female.   Chief Complaint: abdominal pain HPI: 70 yo female with abdominal pain, found to have large gallstone. The symptoms are postprandial. She has occasional nausea. She denies fevers or chills. The pain does not radiate.  Past Medical History  Diagnosis Date  . Hypertension   . High cholesterol   . Hypertension   . PONV (postoperative nausea and vomiting)   . Prediabetes   . Reflux   . Gall stones   . Insomnia   . Myocardial infarction [facility])     "mild MI 2005"  . GERD (gastroesophageal reflux disease)     tums every now and then  . Arthritis     Past Surgical History  Procedure Laterality Date  . Neck surgery  x3  . Vaginal hysterectomyh    . Dilation and curettage of uterus    . Back surgery  1992  . Eye surgery      cataract removal  . Esophagogastroduodenoscopy  04/15/2011    Procedure: ESOPHAGOGASTRODUODENOSCOPY (EGD);  Surgeon: Rogene Houston, MD;  Location: [facility];  Service: Endoscopy;  Laterality: N/A;  11:30  . Colonoscopy N/A 08/30/2012    Procedure: COLONOSCOPY;  Surgeon: Rogene Houston, MD;  Location: [facility] ENDO [facility];  Service: Endoscopy;  Laterality: N/A;  830  . Abdominal hysterectomy    . Cardiac catheterization      BACK IN 2004  SHE THINKS IT CAME BACK 'NORMAL'    Family History  Problem Relation Age of Onset  . Anesthesia problems Neg Hx   . Hypotension Neg Hx   . Malignant hyperthermia Neg Hx   . Pseudochol deficiency Neg Hx    Social History:  reports that she has never smoked. She does not have any smokeless tobacco history on file. She reports that she does not drink alcohol or use illicit drugs.  Allergies:  Allergies  Allergen Reactions  . Phenobarbital     Causes blurred vision    Medications Prior to Admission  Medication Sig Dispense Refill  . allopurinol (ZYLOPRIM) 100 MG tablet Take 2 every day (Patient taking differently: Take 200 mg by mouth daily. Take 2 every day) 180 tablet 1  . ALPRAZolam  (XANAX) 1 MG tablet TAKE ONE TABLET BY MOUTH AT BEDTIME AS NEEDED (Patient taking differently: TAKE ONE TABLET BY MOUTH AT BEDTIME AS NEEDED for sleep) 90 tablet 3  . aspirin 81 MG tablet Take 81 mg by mouth daily.      Marland Kitchen estradiol (ESTRACE) 1 MG tablet Take 1 mg by mouth daily.    Marland Kitchen OVER THE COUNTER MEDICATION Take 2 tablets by mouth daily. Bosley healthy hair vitality supplement    . potassium chloride (K-DUR) 10 MEQ tablet Take 1 tablet (10 mEq total) by mouth daily. 30 tablet 6  . pravastatin (PRAVACHOL) 80 MG tablet TAKE ONE TABLET ONCE DAILY 90 tablet 1  . torsemide (DEMADEX) 20 MG tablet TAKE ONE TABLET EVERY MORNING 90 tablet 1    Results for orders placed or performed during the hospital encounter of 06/23/15 (from the past 48 hour(s))  Hemoglobin A1c     Status: Abnormal   Collection Time: 06/23/15 11:48 AM  Result Value Ref Range   Hgb A1c MFr Bld 6.2 (H) 4.8 - 5.6 %    Comment: (NOTE)         Pre-diabetes: 5.7 - 6.4         Diabetes: >6.4  Glycemic control for adults with diabetes: <7.0    Mean Plasma Glucose 131 mg/dL    Comment: (NOTE) Performed At: [facility] [place], [place] [id number] Lindon Romp MD [id number]   CBC WITH DIFFERENTIAL     Status: None   Collection Time: 06/23/15 11:48 AM  Result Value Ref Range   WBC 4.9 4.0 - 10.5 K/uL   RBC 4.82 3.87 - 5.11 MIL/uL   Hemoglobin 13.9 12.0 - 15.0 g/dL   HCT 44.2 36.0 - 46.0 %   MCV 91.7 78.0 - 100.0 fL   MCH 28.8 26.0 - 34.0 pg   MCHC 31.4 30.0 - 36.0 g/dL   RDW 13.3 11.5 - 15.5 %   Platelets 347 150 - 400 K/uL   Neutrophils Relative % 60 %   Neutro Abs 2.9 1.7 - 7.7 K/uL   Lymphocytes Relative 32 %   Lymphs Abs 1.6 0.7 - 4.0 K/uL   Monocytes Relative 7 %   Monocytes Absolute 0.4 0.1 - 1.0 K/uL   Eosinophils Relative 1 %   Eosinophils Absolute 0.0 0.0 - 0.7 K/uL   Basophils Relative 0 %   Basophils Absolute 0.0 0.0 - 0.1 K/uL  Comprehensive metabolic panel      Status: Abnormal   Collection Time: 06/23/15 11:48 AM  Result Value Ref Range   Sodium 141 135 - 145 mmol/L   Potassium 3.6 3.5 - 5.1 mmol/L   Chloride 102 101 - 111 mmol/L   CO2 30 22 - 32 mmol/L   Glucose, Bld 92 65 - 99 mg/dL   BUN 9 6 - 20 mg/dL   Creatinine, Ser 0.96 0.44 - 1.00 mg/dL   Calcium 9.2 8.9 - 10.3 mg/dL   Total Protein 7.1 6.5 - 8.1 g/dL   Albumin 4.1 3.5 - 5.0 g/dL   AST 24 15 - 41 U/L   ALT 15 14 - 54 U/L   Alkaline Phosphatase 61 38 - 126 U/L   Total Bilirubin 0.4 0.3 - 1.2 mg/dL   GFR calc non Af Amer 59 (L) >60 mL/min   GFR calc Af Amer >60 >60 mL/min    Comment: (NOTE) The eGFR has been calculated using the CKD EPI equation. This calculation has not been validated in all clinical situations. eGFR's persistently <60 mL/min signify possible Chronic Kidney Disease.    Anion gap 9 5 - 15   No results found.  Review of Systems  Constitutional: Negative for fever and chills.  HENT: Negative for hearing loss.   Eyes: Negative for blurred vision and double vision.  Respiratory: Negative for cough and hemoptysis.   Cardiovascular: Negative for chest pain and palpitations.  Gastrointestinal: Positive for nausea and abdominal pain. Negative for vomiting.  Genitourinary: Negative for dysuria and urgency.  Musculoskeletal: Negative for myalgias and neck pain.  Skin: Negative for itching and rash.  Neurological: Negative for dizziness, tingling and headaches.  Endo/Heme/Allergies: Does not bruise/bleed easily.  Psychiatric/Behavioral: Negative for depression and suicidal ideas.    Blood pressure 120/92, pulse 61, temperature 98.2 F (36.8 C), temperature source Oral, resp. rate 20, height _0  (1.575 m), weight 61.689 kg (136 lb), SpO2 98 %. Physical Exam  Vitals reviewed. Constitutional: She is oriented to person, place, and time. She appears well-developed and well-nourished.  HENT:  Head: Normocephalic and atraumatic.  Eyes: Conjunctivae and EOM are  normal. Pupils are equal, round, and reactive to light.  Neck: Normal range of motion. Neck supple.  Cardiovascular: Normal rate and  regular rhythm.   Respiratory: Effort normal and breath sounds normal.  GI: Soft. Bowel sounds are normal. She exhibits no distension. There is tenderness.  Musculoskeletal: Normal range of motion.  Neurological: She is alert and oriented to person, place, and time.  Skin: Skin is warm and dry.  Psychiatric: She has a normal mood and affect. Her behavior is normal.     Assessment/Plan 70 yo female with large stone in gallbladder with concern for cholecystitis and matching symptoms. -lap chole  Arta Bruce Jasmaine Rochel 06/24/2015, 11:14 AM

## 2015-06-24 NOTE — Anesthesia Postprocedure Evaluation (Signed)
Anesthesia Post Note  Patient: Terri Douglas  Procedure(s) Performed: Procedure(s) (LRB): LAPAROSCOPIC CHOLECYSTECTOMY (N/A)  Patient location during evaluation: PACU Anesthesia Type: General Level of consciousness: sedated and patient cooperative Pain management: pain level controlled Vital Signs Assessment: post-procedure vital signs reviewed and stable Respiratory status: spontaneous breathing Cardiovascular status: stable Anesthetic complications: no    Last Vitals:  Filed Vitals:   06/24/15 1445 06/24/15 1500  BP:    Pulse: 58 65  Temp:    Resp: 18 13    Last Pain:  Filed Vitals:   06/24/15 1515  PainSc: 3                  Syliva Mee [facility]

## 2015-06-24 NOTE — Transfer of Care (Signed)
Immediate Anesthesia Transfer of Care Note  Patient: Terri Douglas  Procedure(s) Performed: Procedure(s): LAPAROSCOPIC CHOLECYSTECTOMY (N/A)  Patient Location: PACU  Anesthesia Type:General  Level of Consciousness: awake, alert  and oriented  Airway & Oxygen Therapy: Patient Spontanous Breathing and Patient connected to nasal cannula oxygen  Post-op Assessment: Report given to RN, Post -op Vital signs reviewed and stable and Patient moving all extremities X 4  Post vital signs: Reviewed and stable  Last Vitals:  Filed Vitals:   06/24/15 0917  BP: 120/92  Pulse: 61  Temp: 36.8 C  Resp: 20    Complications: No apparent anesthesia complications

## 2015-06-24 NOTE — Op Note (Signed)
Preoperative diagnosis: chronic cholecystitis  Postoperative diagnosis: Same   Procedure: laparoscopic cholecystectomy  Surgeon: Gurney Maxin, M.D.  Asst: none  Anesthesia: Gen.   Indications for procedure: [name] is a 70 y.o. female with symptoms of Abdominal pain and Nausea consistent with gallbladder disease, Confirmed by Ultrasound.  Description of procedure: The patient was brought into the operative suite, placed supine. Anesthesia was administered with endotracheal tube. Patient was strapped in place and foot board was secured. All pressure points were offloaded by foam padding. The patient was prepped and draped in the usual sterile fashion.  A small incision was made to the right of the umbilicus. A 74mm trocar was inserted into the peritoneal cavity with optical entry. Pneumoperitoneum was applied with high flow low pressure. 2 29mm trocars were placed in the RUQ. A 17mm trocar was placed in the subxiphoid space. All trocars sites were first anesthesized with 0.25% marcaine with epinephrine in the subcutaneous and preperitoneal layers. Next the patient was placed in reverse trendelenberg. The gallbladder was retracted cephalad and lateral. The peritoneum was reflected off the infundibulum working lateral to medial.   The cystic duct and cystic artery were identified and further dissection revealed a critical view. The cystic duct and cystic artery were doubly clipped and ligated.   The gallbladder was removed off the liver bed with cautery. The Gallbladder was placed in a specimen bag. The gallbladder fossa was irrigated and hemostasis was applied with cautery. The gallbladder was removed via the 48mm trocar. Because the trocar site was dilated due to the large gallstone present, the fascial defect was closed with a 0 vicryl on a suture passer. Pneumoperitoneum was removed, all trocar were removed. All incisions were closed with 4-0 monocryl subcuticular stitch. The patient woke  from anesthesia and was brought to PACU in stable condition.  Findings: evidence of cholecystitis with gallstones  Specimen: gallbldadder  Blood loss: <50cc  Local anesthesia: 30cc 0.25% marcaine w epi  Complications: none  Gurney Maxin, M.D. General, Bariatric, & Minimally Invasive Surgery [facility]

## 2015-06-24 NOTE — Anesthesia Preprocedure Evaluation (Signed)
Anesthesia Evaluation  Patient identified by MRN, date of birth, ID band Patient awake    Reviewed: Allergy & Precautions, NPO status , Patient's Chart, lab work & pertinent test results  History of Anesthesia Complications (+) PONV and history of anesthetic complications  Airway Mallampati: II  TM Distance: >3 FB Neck ROM: Full    Dental no notable dental hx.    Pulmonary neg pulmonary ROS,    Pulmonary exam normal breath sounds clear to auscultation       Cardiovascular hypertension, Pt. on medications + Past MI  negative cardio ROS Normal cardiovascular exam Rhythm:Regular Rate:Normal     Neuro/Psych negative neurological ROS  negative psych ROS   GI/Hepatic negative GI ROS, Neg liver ROS, GERD  ,  Endo/Other  negative endocrine ROS  Renal/GU negative Renal ROS     Musculoskeletal  (+) Arthritis ,   Abdominal   Peds  Hematology negative hematology ROS (+)   Anesthesia Other Findings   Reproductive/Obstetrics negative OB ROS                             Anesthesia Physical Anesthesia Plan  ASA: II  Anesthesia Plan: General   Post-op Pain Management:    Induction: Intravenous  Airway Management Planned: Oral ETT  Additional Equipment:   Intra-op Plan:   Post-operative Plan: Extubation in OR  Informed Consent: I have reviewed the patients History and Physical, chart, labs and discussed the procedure including the risks, benefits and alternatives for the proposed anesthesia with the patient or authorized representative who has indicated his/her understanding and acceptance.   Dental advisory given  Plan Discussed with: CRNA  Anesthesia Plan Comments:         Anesthesia Quick Evaluation

## 2015-06-24 NOTE — Anesthesia Procedure Notes (Signed)
Procedure Name: Intubation Date/Time: 06/24/2015 12:17 PM Performed by: Claris Che Pre-anesthesia Checklist: Patient identified, Emergency Drugs available, Suction available, Patient being monitored and Timeout performed Patient Re-evaluated:Patient Re-evaluated prior to inductionOxygen Delivery Method: Circle system utilized Preoxygenation: Pre-oxygenation with 100% oxygen Intubation Type: IV induction Ventilation: Mask ventilation without difficulty Laryngoscope Size: Mac and 3 Grade View: Grade II Tube type: Oral Tube size: 7.5 mm Number of attempts: 1 Airway Equipment and Method: Stylet Placement Confirmation: ETT inserted through vocal cords under direct vision,  positive ETCO2 and breath sounds checked- equal and bilateral Secured at: 24 cm Tube secured with: Tape Dental Injury: Teeth and Oropharynx as per pre-operative assessment

## 2015-06-25 ENCOUNTER — Encounter (HOSPITAL_COMMUNITY): Payer: Self-pay | Admitting: General Surgery

## 2015-07-03 ENCOUNTER — Inpatient Hospital Stay: Admit: 2015-07-03 | Payer: MEDICARE | Primary: Physician Assistant

## 2015-07-03 DIAGNOSIS — C44319 Basal cell carcinoma of skin of other parts of face: Secondary | ICD-10-CM

## 2015-08-17 ENCOUNTER — Encounter: Payer: Self-pay | Admitting: *Deleted

## 2015-08-27 ENCOUNTER — Telehealth: Payer: Self-pay | Admitting: Family Medicine

## 2015-08-27 DIAGNOSIS — E785 Hyperlipidemia, unspecified: Secondary | ICD-10-CM

## 2015-08-27 DIAGNOSIS — D485 Neoplasm of uncertain behavior of skin: Secondary | ICD-10-CM | POA: Diagnosis not present

## 2015-08-27 DIAGNOSIS — L658 Other specified nonscarring hair loss: Secondary | ICD-10-CM

## 2015-08-27 DIAGNOSIS — Z23 Encounter for immunization: Secondary | ICD-10-CM | POA: Diagnosis not present

## 2015-08-27 DIAGNOSIS — L650 Telogen effluvium: Secondary | ICD-10-CM | POA: Diagnosis not present

## 2015-08-27 DIAGNOSIS — D0462 Carcinoma in situ of skin of left upper limb, including shoulder: Secondary | ICD-10-CM | POA: Diagnosis not present

## 2015-08-27 DIAGNOSIS — Z79899 Other long term (current) drug therapy: Secondary | ICD-10-CM

## 2015-08-27 DIAGNOSIS — D2239 Melanocytic nevi of other parts of face: Secondary | ICD-10-CM | POA: Diagnosis not present

## 2015-08-27 NOTE — Telephone Encounter (Signed)
Pt is needing labs sent over for an upcoming appt the beginning of Apr. Last labs per epic were: bmp and uric acid on 05/08/15. The Dermatology office is wanting tsh,ferritin,and vitamin d added as well with the dx codes L65.0 and L65.8. Pt is wanting to get these done on Sat. The dermatology office is requesting the results on the tsh,ferritin,and vitamin d faxed to them at [phone].

## 2015-08-28 NOTE — Telephone Encounter (Signed)
The patient will need lipid liver because of hyperlipidemia. Let the patient know that her kidney functions potassium uric acid in December looked good I don't recommend repeating that at this point. She is not due for A1c at this point. She may have the labs requested by dermatology-please asked the patient to remind me at the office visit that she would like those sent to the dermatologist because that is typically not something we will remember to do the cause this is a special request

## 2015-08-28 NOTE — Telephone Encounter (Signed)
Labs ordered. Patient was notified of the information below also.

## 2015-08-29 DIAGNOSIS — L650 Telogen effluvium: Secondary | ICD-10-CM | POA: Diagnosis not present

## 2015-08-29 DIAGNOSIS — Z79899 Other long term (current) drug therapy: Secondary | ICD-10-CM | POA: Diagnosis not present

## 2015-08-29 DIAGNOSIS — E785 Hyperlipidemia, unspecified: Secondary | ICD-10-CM | POA: Diagnosis not present

## 2015-08-29 DIAGNOSIS — L658 Other specified nonscarring hair loss: Secondary | ICD-10-CM | POA: Diagnosis not present

## 2015-08-31 DIAGNOSIS — Z779 Other contact with and (suspected) exposures hazardous to health: Secondary | ICD-10-CM | POA: Diagnosis not present

## 2015-08-31 LAB — HEPATIC FUNCTION PANEL
ALT: 16 IU/L (ref 0–32)
AST: 21 IU/L (ref 0–40)
Albumin: 4.2 g/dL (ref 3.6–4.8)
Alkaline Phosphatase: 68 IU/L (ref 39–117)
Bilirubin Total: 0.2 mg/dL (ref 0.0–1.2)
Bilirubin, Direct: 0.05 mg/dL (ref 0.00–0.40)
Total Protein: 6.5 g/dL (ref 6.0–8.5)

## 2015-08-31 LAB — LIPID PANEL
Chol/HDL Ratio: 2.9 ratio units (ref 0.0–4.4)
Cholesterol, Total: 179 mg/dL (ref 100–199)
HDL: 62 mg/dL (ref >39)
LDL Calculated: 85 mg/dL (ref 0–99)
Triglycerides: 160 mg/dL — ABNORMAL HIGH (ref 0–149)
VLDL Cholesterol Cal: 32 mg/dL (ref 5–40)

## 2015-08-31 LAB — FERRITIN: Ferritin: 37 ng/mL (ref 15–150)

## 2015-08-31 LAB — TSH: TSH: 2.53 u[IU]/mL (ref 0.450–4.500)

## 2015-09-05 ENCOUNTER — Other Ambulatory Visit: Payer: Self-pay | Admitting: Family Medicine

## 2015-09-07 NOTE — Telephone Encounter (Signed)
May have this refill, office visit within the next 3 months

## 2015-09-08 DIAGNOSIS — D0462 Carcinoma in situ of skin of left upper limb, including shoulder: Secondary | ICD-10-CM | POA: Diagnosis not present

## 2015-09-09 DIAGNOSIS — Z5189 Encounter for other specified aftercare: Secondary | ICD-10-CM | POA: Diagnosis not present

## 2015-09-11 ENCOUNTER — Encounter: Payer: Self-pay | Admitting: Family Medicine

## 2015-09-11 ENCOUNTER — Ambulatory Visit (INDEPENDENT_AMBULATORY_CARE_PROVIDER_SITE_OTHER): Payer: Medicare Other | Admitting: Family Medicine

## 2015-09-11 VITALS — BP 138/80 | Ht 62.0 in | Wt 132.1 lb

## 2015-09-11 DIAGNOSIS — E7800 Pure hypercholesterolemia, unspecified: Secondary | ICD-10-CM

## 2015-09-11 DIAGNOSIS — I10 Essential (primary) hypertension: Secondary | ICD-10-CM

## 2015-09-11 MED ORDER — ALLOPURINOL 100 MG PO TABS: ORAL_TABLET | ORAL | Status: DC

## 2015-09-11 MED ORDER — PRAVASTATIN SODIUM 80 MG PO TABS: 80.0000 mg | ORAL_TABLET | Freq: Every day | ORAL | Status: DC

## 2015-09-11 MED ORDER — TORSEMIDE 20 MG PO TABS: 20.0000 mg | ORAL_TABLET | Freq: Every morning | ORAL | Status: DC

## 2015-09-11 MED ORDER — CLINDAMYCIN HCL 300 MG PO CAPS: 300.0000 mg | ORAL_CAPSULE | Freq: Three times a day (TID) | ORAL | Status: DC

## 2015-09-11 MED ORDER — POTASSIUM CHLORIDE ER 10 MEQ PO TBCR: 10.0000 meq | EXTENDED_RELEASE_TABLET | Freq: Every day | ORAL | Status: DC

## 2015-09-11 NOTE — Progress Notes (Signed)
   Subjective:    Patient ID: Terri Douglas, female    DOB: Oct 17, 1945, 70 y.o.   MRN: [id number]  Hypertension This is a chronic problem. The current episode started more than 1 year ago. The problem has been gradually improving since onset. There are no associated agents to hypertension. There are no known risk factors for coronary artery disease. Treatments tried: torsemide. The current treatment provides moderate improvement. There are no compliance problems.    Patient states that she has questions about vitamin D and probiotics.  Patient has a place on her arm where she had skin cancer removed and it is red and itching also.  Patient denies any chest tightness pressure pain shortness breath nausea vomiting diarrhea.  Review of Systems Patient denies   see above Objective:   Physical Exam Lungs clear heart regular abdomen soft extremities no edema skin warm dry skin infection noted on the left arm this is a reddened area with a little bit of drainage from where a skin biopsy was done       Assessment & Plan:  Left arm skin infection clindamycin 3 times a day for 7 days patient going to the beach could not take doxycycline. No recent flareups of gout continue current medication Estrogen issues managed by gynecology Pravastatin cholesterol under good control continue current measures Pedal edema stable continue Demadex and potassium

## 2015-09-18 ENCOUNTER — Ambulatory Visit: Payer: Medicare Other | Admitting: Family Medicine

## 2015-10-06 ENCOUNTER — Ambulatory Visit (HOSPITAL_COMMUNITY)
Admission: RE | Admit: 2015-10-06 | Discharge: 2015-10-06 | Disposition: A | Discharge status: Home / Self Care | Payer: Medicare Other | Source: Ambulatory Visit | Attending: Family Medicine | Admitting: Family Medicine

## 2015-10-06 ENCOUNTER — Encounter: Payer: Self-pay | Admitting: Family Medicine

## 2015-10-06 ENCOUNTER — Ambulatory Visit (INDEPENDENT_AMBULATORY_CARE_PROVIDER_SITE_OTHER): Payer: Medicare Other | Admitting: Family Medicine

## 2015-10-06 VITALS — BP 110/60 | Temp 99.2°F | Ht 62.0 in | Wt 129.5 lb

## 2015-10-06 DIAGNOSIS — R509 Fever, unspecified: Secondary | ICD-10-CM

## 2015-10-06 DIAGNOSIS — R000 Tachycardia, unspecified: Secondary | ICD-10-CM | POA: Insufficient documentation

## 2015-10-06 DIAGNOSIS — J9811 Atelectasis: Secondary | ICD-10-CM | POA: Diagnosis not present

## 2015-10-06 LAB — POCT URINALYSIS DIPSTICK
Blood, UA: 10
Spec Grav, UA: 1.02
pH, UA: 5

## 2015-10-06 MED ORDER — DOXYCYCLINE HYCLATE 100 MG PO CAPS: 100.0000 mg | ORAL_CAPSULE | Freq: Two times a day (BID) | ORAL | Status: DC

## 2015-10-06 NOTE — Progress Notes (Signed)
   Subjective:    Patient ID: Terri Douglas, female    DOB: [birth year], 70 y.o.   MRN: [id number]  Fever  This is a new problem. The current episode started in the past 7 days. The problem occurs intermittently. The problem has been unchanged. The maximum temperature noted was 101 to 101.9 F. Associated symptoms comments: Chills, heart racing, weak, lower left leg discomfort. Treatments tried: aspirin. The treatment provided no relief.   Patient has no other concerns at this time.  Past several days fever chills some body aches some headache no neck stiffness no wheezing no chest congestion  Review of Systems  Constitutional: Positive for fever.  Patient denies head congestion drainage coughing wheezing chest pain abdominal pain vomiting diarrhea rash tick bite. Patient relates moderate fatigue.    Objective:   Physical Exam Lungs are clear heart slight tachycardia no irregularity noted abdomen soft no guarding or rebound or tenderness extremities no edema skin warm dry neurologic grossly normal  EKG no acute changes slight tachycardia Chest x-ray ordered awaiting results     Assessment & Plan:  Febrile illness along with sensation of the heart running fast-probably mild tachycardia related to her febrile illness. Patient is concerned. We will need to do further testing. Will use doxycycline twice a day to cover for the possibility of tick-related infection. May need further testing as well.  Weight loss related to very healthy eating and patient doing portion control. Thyroid testing in the past been normal  We will test for RMSF as well as Lyme's disease

## 2015-10-08 ENCOUNTER — Encounter: Payer: Self-pay | Admitting: Family Medicine

## 2015-10-08 ENCOUNTER — Ambulatory Visit (INDEPENDENT_AMBULATORY_CARE_PROVIDER_SITE_OTHER): Payer: Medicare Other | Admitting: Family Medicine

## 2015-10-08 VITALS — BP 110/70 | Temp 97.8°F | Ht 62.0 in | Wt 130.0 lb

## 2015-10-08 DIAGNOSIS — R197 Diarrhea, unspecified: Secondary | ICD-10-CM

## 2015-10-08 DIAGNOSIS — R509 Fever, unspecified: Secondary | ICD-10-CM | POA: Diagnosis not present

## 2015-10-08 LAB — CBC WITH DIFFERENTIAL/PLATELET
Basophils Absolute: 0 10*3/uL (ref 0.0–0.2)
Basos: 0 %
EOS (ABSOLUTE): 0 10*3/uL (ref 0.0–0.4)
Eos: 0 %
Hematocrit: 44 % (ref 34.0–46.6)
Hemoglobin: 14.3 g/dL (ref 11.1–15.9)
Immature Grans (Abs): 0 10*3/uL (ref 0.0–0.1)
Immature Granulocytes: 0 %
Lymphocytes Absolute: 0.7 10*3/uL (ref 0.7–3.1)
Lymphs: 15 %
MCH: 29.5 pg (ref 26.6–33.0)
MCHC: 32.5 g/dL (ref 31.5–35.7)
MCV: 91 fL (ref 79–97)
Monocytes Absolute: 0.6 10*3/uL (ref 0.1–0.9)
Monocytes: 11 %
Neutrophils Absolute: 3.7 10*3/uL (ref 1.4–7.0)
Neutrophils: 74 %
Platelets: 319 10*3/uL (ref 150–379)
RBC: 4.85 x10E6/uL (ref 3.77–5.28)
RDW: 13.9 % (ref 12.3–15.4)
WBC: 5 10*3/uL (ref 3.4–10.8)

## 2015-10-08 LAB — BASIC METABOLIC PANEL
BUN/Creatinine Ratio: 7 — ABNORMAL LOW (ref 12–28)
BUN: 9 mg/dL (ref 8–27)
CO2: 30 mmol/L — ABNORMAL HIGH (ref 18–29)
Calcium: 9.3 mg/dL (ref 8.7–10.3)
Chloride: 91 mmol/L — ABNORMAL LOW (ref 96–106)
Creatinine, Ser: 1.24 mg/dL — ABNORMAL HIGH (ref 0.57–1.00)
GFR calc Af Amer: 51 mL/min/{1.73_m2} — ABNORMAL LOW (ref >59)
GFR calc non Af Amer: 44 mL/min/{1.73_m2} — ABNORMAL LOW (ref >59)
Glucose: 119 mg/dL — ABNORMAL HIGH (ref 65–99)
Potassium: 3.5 mmol/L (ref 3.5–5.2)
Sodium: 142 mmol/L (ref 134–144)

## 2015-10-08 LAB — C-REACTIVE PROTEIN: CRP: 76.3 mg/L — ABNORMAL HIGH (ref 0.0–4.9)

## 2015-10-08 LAB — RICKETTSIAL FEVER ABS
Q Fever Phase I: NEGATIVE
Q Fever Phase II: NEGATIVE
RMSF IgG: NEGATIVE

## 2015-10-08 LAB — B. BURGDORFI ANTIBODIES: Lyme IgG/IgM Ab: <0.91 {ISR} (ref 0.00–0.90)

## 2015-10-08 LAB — HEPATIC FUNCTION PANEL
ALT: 14 IU/L (ref 0–32)
AST: 24 IU/L (ref 0–40)
Albumin: 4.6 g/dL (ref 3.6–4.8)
Alkaline Phosphatase: 77 IU/L (ref 39–117)
Bilirubin Total: <0.2 mg/dL (ref 0.0–1.2)
Bilirubin, Direct: 0.07 mg/dL (ref 0.00–0.40)
Total Protein: 7 g/dL (ref 6.0–8.5)

## 2015-10-08 NOTE — Progress Notes (Signed)
   Subjective:    Patient ID: Terri Douglas, female    DOB: March 21, 1946, 70 y.o.   MRN: [id number]  Fever  This is a new problem. The current episode started in the past 7 days. The problem occurs intermittently. The problem has been unchanged. The maximum temperature noted was 101 to 101.9 F. Associated symptoms comments: Knots on sides. Treatments tried: aspirin. The treatment provided no relief.  Patient relates that all of this illness started on Saturday night into Sunday with fever and chills and not feeling well she also states frequent loose stools several times on Sunday Monday and Tuesday some on Wednesday she started feeling better yesterday but then she ran fever last night she comes in today to get rechecked. She is taking the doxycycline twice a day. She denies nausea or vomiting. Denies bloody stools. No myalgias. No joint pain. She does have some small nodules under the skin that she is worried about plus also some tenderness in the left lower back. She denies her heart running fast on her. Heart rates pain in the 80s 90s. She does relate her appetite is been good.  She relates that the weight loss she sustained over the past several months has been because she typically bleeds a piece of toast for breakfast she eats some mild lunch and then usually does a bolus cereal at supper and typically does not one to eat otherwise. She states she is felt good all the way up until this past weekend.  Review of Systems  Constitutional: Positive for fever.       Objective:   Physical Exam Patient does not appear to be in distress neck is supple no masses in the neck HEENT is benign lungs clear no crackles heart is regular there is no murmur abdomen soft no guarding rebound or tenderness. Left lower back some tenderness in the sacroiliac region but no abnormality noted multiple examinations done in her side skin and upper hip I do not find any signs of any type of abnormal nodules. Extremities no  edema skin warm dry       Assessment & Plan:  Febrile illness Diarrhea Probable viral Stool cultures stool wbc's/C. difficile PCR await the results of all of this continue doxycycline to cover for tick related illness keep them fever schedule. If fever is not getting better over the course of the next 3-5 days may need to do additional testing if it goes beyond 7 days may need to do abdominal scan is well maintained to get infectious disease involved to if fever persists

## 2015-10-09 DIAGNOSIS — R509 Fever, unspecified: Secondary | ICD-10-CM | POA: Diagnosis not present

## 2015-10-09 DIAGNOSIS — R197 Diarrhea, unspecified: Secondary | ICD-10-CM | POA: Diagnosis not present

## 2015-10-10 LAB — PLEASE NOTE

## 2015-10-10 LAB — URINE CULTURE: Organism ID, Bacteria: NO GROWTH

## 2015-10-10 LAB — CLOSTRIDIUM DIFFICILE BY PCR: Toxigenic C. Difficile by PCR: NEGATIVE

## 2015-10-13 LAB — STOOL CULTURE: E coli, Shiga toxin Assay: NEGATIVE

## 2015-11-09 DIAGNOSIS — Z1231 Encounter for screening mammogram for malignant neoplasm of breast: Secondary | ICD-10-CM | POA: Diagnosis not present

## 2015-12-04 ENCOUNTER — Other Ambulatory Visit: Payer: Self-pay | Admitting: *Deleted

## 2015-12-04 MED ORDER — ALPRAZOLAM 1 MG PO TABS: 1.0000 mg | ORAL_TABLET | Freq: Every evening | ORAL | Status: DC | PRN

## 2015-12-04 NOTE — Progress Notes (Signed)
May refill times 3

## 2016-01-11 DIAGNOSIS — Z779 Other contact with and (suspected) exposures hazardous to health: Secondary | ICD-10-CM | POA: Diagnosis not present

## 2016-01-11 DIAGNOSIS — R87612 Low grade squamous intraepithelial lesion on cytologic smear of cervix (LGSIL): Secondary | ICD-10-CM | POA: Diagnosis not present

## 2016-02-15 ENCOUNTER — Encounter

## 2016-02-18 ENCOUNTER — Encounter

## 2016-02-18 ENCOUNTER — Inpatient Hospital Stay: Admit: 2016-02-18 | Payer: MEDICARE | Attending: Physician Assistant | Primary: Physician Assistant

## 2016-02-18 DIAGNOSIS — Z1231 Encounter for screening mammogram for malignant neoplasm of breast: Secondary | ICD-10-CM

## 2016-03-24 DIAGNOSIS — Z85828 Personal history of other malignant neoplasm of skin: Secondary | ICD-10-CM | POA: Diagnosis not present

## 2016-03-24 DIAGNOSIS — D224 Melanocytic nevi of scalp and neck: Secondary | ICD-10-CM | POA: Diagnosis not present

## 2016-03-24 DIAGNOSIS — D2261 Melanocytic nevi of right upper limb, including shoulder: Secondary | ICD-10-CM | POA: Diagnosis not present

## 2016-03-24 DIAGNOSIS — L821 Other seborrheic keratosis: Secondary | ICD-10-CM | POA: Diagnosis not present

## 2016-03-24 DIAGNOSIS — L650 Telogen effluvium: Secondary | ICD-10-CM | POA: Diagnosis not present

## 2016-03-24 DIAGNOSIS — L814 Other melanin hyperpigmentation: Secondary | ICD-10-CM | POA: Diagnosis not present

## 2016-03-24 DIAGNOSIS — L570 Actinic keratosis: Secondary | ICD-10-CM | POA: Diagnosis not present

## 2016-03-24 DIAGNOSIS — L658 Other specified nonscarring hair loss: Secondary | ICD-10-CM | POA: Diagnosis not present

## 2016-03-24 DIAGNOSIS — Z23 Encounter for immunization: Secondary | ICD-10-CM | POA: Diagnosis not present

## 2016-03-24 DIAGNOSIS — D2271 Melanocytic nevi of right lower limb, including hip: Secondary | ICD-10-CM | POA: Diagnosis not present

## 2016-03-25 ENCOUNTER — Telehealth: Payer: Self-pay | Admitting: Family Medicine

## 2016-03-25 DIAGNOSIS — I10 Essential (primary) hypertension: Secondary | ICD-10-CM

## 2016-03-25 DIAGNOSIS — E785 Hyperlipidemia, unspecified: Secondary | ICD-10-CM

## 2016-03-25 NOTE — Telephone Encounter (Signed)
Lipid, liver, metabolic 7 

## 2016-03-25 NOTE — Telephone Encounter (Signed)
Spoke with patient and informed her per [place] for upcoming appointment have been ordered. Advise patient to be fasting prior to labs being drawn. Patient verbalized understanding.

## 2016-03-25 NOTE — Telephone Encounter (Signed)
Patient needs order for blood work to have results for appointment on 04/01/16.

## 2016-03-26 DIAGNOSIS — I10 Essential (primary) hypertension: Secondary | ICD-10-CM | POA: Diagnosis not present

## 2016-03-26 DIAGNOSIS — E785 Hyperlipidemia, unspecified: Secondary | ICD-10-CM | POA: Diagnosis not present

## 2016-03-27 LAB — HEPATIC FUNCTION PANEL
ALT: 12 IU/L (ref 0–32)
AST: 20 IU/L (ref 0–40)
Albumin: 4.2 g/dL (ref 3.6–4.8)
Alkaline Phosphatase: 67 IU/L (ref 39–117)
Bilirubin Total: 0.5 mg/dL (ref 0.0–1.2)
Bilirubin, Direct: 0.12 mg/dL (ref 0.00–0.40)
Total Protein: 6.3 g/dL (ref 6.0–8.5)

## 2016-03-27 LAB — BASIC METABOLIC PANEL
BUN/Creatinine Ratio: 11 — ABNORMAL LOW (ref 12–28)
BUN: 10 mg/dL (ref 8–27)
CO2: 28 mmol/L (ref 18–29)
Calcium: 9.5 mg/dL (ref 8.7–10.3)
Chloride: 102 mmol/L (ref 96–106)
Creatinine, Ser: 0.87 mg/dL (ref 0.57–1.00)
GFR calc Af Amer: 79 mL/min/{1.73_m2} (ref >59)
GFR calc non Af Amer: 68 mL/min/{1.73_m2} (ref >59)
Glucose: 93 mg/dL (ref 65–99)
Potassium: 4.2 mmol/L (ref 3.5–5.2)
Sodium: 142 mmol/L (ref 134–144)

## 2016-03-27 LAB — LIPID PANEL
Chol/HDL Ratio: 3.9 ratio units (ref 0.0–4.4)
Cholesterol, Total: 197 mg/dL (ref 100–199)
HDL: 51 mg/dL (ref >39)
LDL Calculated: 104 mg/dL — ABNORMAL HIGH (ref 0–99)
Triglycerides: 212 mg/dL — ABNORMAL HIGH (ref 0–149)
VLDL Cholesterol Cal: 42 mg/dL — ABNORMAL HIGH (ref 5–40)

## 2016-03-31 ENCOUNTER — Other Ambulatory Visit: Payer: Self-pay | Admitting: Family Medicine

## 2016-04-01 ENCOUNTER — Ambulatory Visit (INDEPENDENT_AMBULATORY_CARE_PROVIDER_SITE_OTHER): Payer: Medicare Other | Admitting: Family Medicine

## 2016-04-01 ENCOUNTER — Encounter: Payer: Self-pay | Admitting: Family Medicine

## 2016-04-01 VITALS — BP 126/74 | Ht 62.0 in | Wt 134.5 lb

## 2016-04-01 DIAGNOSIS — E7800 Pure hypercholesterolemia, unspecified: Secondary | ICD-10-CM

## 2016-04-01 DIAGNOSIS — Z23 Encounter for immunization: Secondary | ICD-10-CM

## 2016-04-01 DIAGNOSIS — R1013 Epigastric pain: Secondary | ICD-10-CM | POA: Diagnosis not present

## 2016-04-01 DIAGNOSIS — I10 Essential (primary) hypertension: Secondary | ICD-10-CM

## 2016-04-01 MED ORDER — PANTOPRAZOLE SODIUM 40 MG PO TBEC: 40.0000 mg | DELAYED_RELEASE_TABLET | Freq: Every day | ORAL | 3 refills | Status: DC

## 2016-04-01 NOTE — Progress Notes (Signed)
   Subjective:    Patient ID: Terri Douglas, female    DOB: [birth year], 70 y.o.   MRN: [id number]  Hypertension  This is a chronic problem. The current episode started more than 1 year ago. Pertinent negatives include no chest pain or shortness of breath. There are no compliance problems.    Patient has concerns of epigastric pain.past 1 to 2 weeks Comes and goes  lasts 15 min at a time No NSAIDS Patient denies that it wakes her up at night. There is no vomiting with it. No fevers no sweats or weight loss. Still has good appetite.   Review of Systems  Constitutional: Negative for fatigue and fever.  HENT: Negative for congestion.   Respiratory: Negative for cough and shortness of breath.   Cardiovascular: Negative for chest pain.  Gastrointestinal: Positive for abdominal pain. Negative for blood in stool, constipation, diarrhea and nausea.       Objective:   Physical Exam  Lungs are clear no crackles heart regular abdomen is soft there is no guarding rebound or tenderness extremities no edema      Assessment & Plan:  Hyperlipidemia previous labs reviewed with patient continue current medication watch diet closely recheck it again in the spring  Insomnia Xanax when necessary continue current measures  Gout no flareup recently continue current measures watch diet  Hypertension continue current measures watch diet stay active under good control  Intermittent epigastric pain over the past week in the right upper quadrant and epigastric region try acid blocker if not seen significant improvement over the course of the next several weeks will need further testing gastroenterology referral EGD patient to follow-up in 3-4 weeks

## 2016-04-04 ENCOUNTER — Other Ambulatory Visit: Payer: Self-pay | Admitting: Family Medicine

## 2016-04-26 DIAGNOSIS — Z779 Other contact with and (suspected) exposures hazardous to health: Secondary | ICD-10-CM | POA: Diagnosis not present

## 2016-04-26 DIAGNOSIS — R87612 Low grade squamous intraepithelial lesion on cytologic smear of cervix (LGSIL): Secondary | ICD-10-CM | POA: Diagnosis not present

## 2016-05-02 ENCOUNTER — Ambulatory Visit (INDEPENDENT_AMBULATORY_CARE_PROVIDER_SITE_OTHER): Payer: Medicare Other | Admitting: Family Medicine

## 2016-05-02 ENCOUNTER — Telehealth: Payer: Self-pay | Admitting: Family Medicine

## 2016-05-02 ENCOUNTER — Other Ambulatory Visit: Payer: Self-pay | Admitting: Family Medicine

## 2016-05-02 ENCOUNTER — Encounter: Payer: Self-pay | Admitting: Family Medicine

## 2016-05-02 VITALS — BP 122/84 | Ht 62.0 in | Wt 134.8 lb

## 2016-05-02 DIAGNOSIS — R1013 Epigastric pain: Secondary | ICD-10-CM

## 2016-05-02 DIAGNOSIS — R040 Epistaxis: Secondary | ICD-10-CM | POA: Diagnosis not present

## 2016-05-02 NOTE — Telephone Encounter (Signed)
Notified patient Dr. Nicki Reaper spoke with Dr. Ewell Poe PA-she will discuss case with him on Tuesday when he is back. She will call us and let us know when they might be able to see her. Currently right now they're trying their best to see if they can see her in early December for EGD. We will know more tomorrow and will call her at that time. Patient verbalized understanding.

## 2016-05-02 NOTE — Telephone Encounter (Signed)
Please let the patient know that I did speak with Dr. Ewell Poe PA-she will discuss case with him on Tuesday when he is back. She will call us and let us know when they might be able to see her. Currently right now they're trying their best to see if they can see her in early December for EGD. We will know more tomorrow and will call her at that time.

## 2016-05-02 NOTE — Progress Notes (Signed)
   Subjective:    Patient ID: Terri Douglas, female    DOB: Nov 28, 1945, 70 y.o.   MRN: [id number]  HPI Patient arrives for a follow up on abdominal pain. Patient states it is not doing much better. Patient relates intermittent epigastric pain and discomfort. Describes it more as aching sensation. Does not wake her up at night. No chest tightness or pressure or shortness of breath associated with it does not radiate to the back does not radiate to the lower abdomen no rectal bleeding. Patient tried PPI did not really help any. Patient had last colonoscopy 2014 last EGD 2012  Review of Systems  Gastrointestinal: Positive for abdominal pain and constipation. Negative for diarrhea, nausea and vomiting.       Objective:   Physical Exam  Constitutional: She appears well-nourished. No distress.  Cardiovascular: Normal rate, regular rhythm and normal heart sounds.   No murmur heard. Pulmonary/Chest: Effort normal and breath sounds normal. No respiratory distress.  Musculoskeletal: She exhibits no edema.  Lymphadenopathy:    She has no cervical adenopathy.  Neurological: She is alert. She exhibits normal muscle tone.  Psychiatric: Her behavior is normal.  Vitals reviewed.   Patient denies change and appetite denies weight loss      Assessment & Plan:  Intermittent epigastric pain and discomfort despite several weeks of PPI-referral to GI more than likely will need EGD if that does not define the issue may need CT scan

## 2016-05-05 ENCOUNTER — Other Ambulatory Visit (INDEPENDENT_AMBULATORY_CARE_PROVIDER_SITE_OTHER): Payer: Self-pay | Admitting: Internal Medicine

## 2016-05-05 ENCOUNTER — Telehealth (INDEPENDENT_AMBULATORY_CARE_PROVIDER_SITE_OTHER): Payer: Self-pay | Admitting: Internal Medicine

## 2016-05-05 DIAGNOSIS — R142 Eructation: Secondary | ICD-10-CM

## 2016-05-05 DIAGNOSIS — R1013 Epigastric pain: Secondary | ICD-10-CM

## 2016-05-05 NOTE — Telephone Encounter (Signed)
Ann, EGD. I have spoken with patient.  

## 2016-05-09 ENCOUNTER — Encounter (INDEPENDENT_AMBULATORY_CARE_PROVIDER_SITE_OTHER): Payer: Self-pay | Admitting: *Deleted

## 2016-05-09 DIAGNOSIS — R1013 Epigastric pain: Secondary | ICD-10-CM | POA: Insufficient documentation

## 2016-05-09 DIAGNOSIS — R142 Eructation: Secondary | ICD-10-CM | POA: Insufficient documentation

## 2016-05-09 NOTE — Telephone Encounter (Signed)
EGD sch'd 05/27/16, patient aware, instructions mailed

## 2016-05-27 ENCOUNTER — Encounter (HOSPITAL_COMMUNITY)
Admission: RE | Disposition: A | Discharge status: Home / Self Care | Payer: Self-pay | Source: Ambulatory Visit | Attending: Internal Medicine

## 2016-05-27 ENCOUNTER — Encounter (HOSPITAL_COMMUNITY): Payer: Self-pay | Admitting: *Deleted

## 2016-05-27 ENCOUNTER — Ambulatory Visit (HOSPITAL_COMMUNITY)
Admission: RE | Admit: 2016-05-27 | Discharge: 2016-05-27 | Disposition: A | Discharge status: Home / Self Care | Payer: Medicare Other | Source: Ambulatory Visit | Attending: Internal Medicine | Admitting: Internal Medicine

## 2016-05-27 DIAGNOSIS — R1013 Epigastric pain: Secondary | ICD-10-CM | POA: Insufficient documentation

## 2016-05-27 DIAGNOSIS — R142 Eructation: Secondary | ICD-10-CM | POA: Insufficient documentation

## 2016-05-27 DIAGNOSIS — K317 Polyp of stomach and duodenum: Secondary | ICD-10-CM | POA: Diagnosis not present

## 2016-05-27 DIAGNOSIS — K228 Other specified diseases of esophagus: Secondary | ICD-10-CM | POA: Diagnosis not present

## 2016-05-27 HISTORY — PX: ESOPHAGOGASTRODUODENOSCOPY: SHX5428

## 2016-05-27 SURGERY — EGD (ESOPHAGOGASTRODUODENOSCOPY)
Anesthesia: Moderate Sedation

## 2016-05-27 MED ORDER — MEPERIDINE HCL 50 MG/ML IJ SOLN
INTRAMUSCULAR | Status: DC | PRN
  Administered 2016-05-27 (×2): 25 mg via INTRAVENOUS

## 2016-05-27 MED ORDER — MEPERIDINE HCL 50 MG/ML IJ SOLN
INTRAMUSCULAR | Status: AC
  Filled 2016-05-27: qty 1

## 2016-05-27 MED ORDER — SUCRALFATE 1 G PO TABS
1.0000 g | ORAL_TABLET | Freq: Three times a day (TID) | ORAL | 0 refills | Status: DC
Start: 2016-05-27 — End: 2016-12-20

## 2016-05-27 MED ORDER — SODIUM CHLORIDE 0.9 % IV SOLN
INTRAVENOUS | Status: DC
  Administered 2016-05-27: 1000 mL via INTRAVENOUS

## 2016-05-27 MED ORDER — MIDAZOLAM HCL 5 MG/5ML IJ SOLN
INTRAMUSCULAR | Status: DC
Start: 2016-05-27 — End: 2016-05-27
  Filled 2016-05-27: qty 10

## 2016-05-27 MED ORDER — BUTAMBEN-TETRACAINE-BENZOCAINE 2-2-14 % EX AERO
INHALATION_SPRAY | CUTANEOUS | Status: DC | PRN
  Administered 2016-05-27: 2 via TOPICAL

## 2016-05-27 MED ORDER — STERILE WATER FOR IRRIGATION IR SOLN
Status: DC | PRN
  Administered 2016-05-27: 11:00:00

## 2016-05-27 MED ORDER — BUTAMBEN-TETRACAINE-BENZOCAINE 2-2-14 % EX AERO
INHALATION_SPRAY | CUTANEOUS | Status: AC
  Filled 2016-05-27: qty 20

## 2016-05-27 MED ORDER — MIDAZOLAM HCL 5 MG/5ML IJ SOLN
INTRAMUSCULAR | Status: DC | PRN
  Administered 2016-05-27 (×5): 2 mg via INTRAVENOUS

## 2016-05-27 NOTE — Op Note (Signed)
[facility] Patient Name: Terri Douglas Procedure Date: 05/27/2016 10:20 AM MRN: [id number] Date of Birth: [birth year] Attending MD: Hildred Laser , MD CSN: [id number] Age: 70 Admit Type: Outpatient Procedure:                Upper GI endoscopy Indications:              Epigastric abdominal pain Providers:                Hildred Laser, MD, Otis Peak B. Sharon Seller, RN, Randa Spike, Technician, Aram Candela Referring MD:             Elayne Snare. Wolfgang Phoenix, MD Medicines:                Cetacaine spray, Meperidine 50 mg IV, Midazolam 10                            mg IV Complications:            No immediate complications. Estimated Blood Loss:     Estimated blood loss: none. Procedure:                Pre-Anesthesia Assessment:                           - Prior to the procedure, a History and Physical                            was performed, and patient medications and                            allergies were reviewed. The patient's tolerance of                            previous anesthesia was also reviewed. The risks                            and benefits of the procedure and the sedation                            options and risks were discussed with the patient.                            All questions were answered, and informed consent                            was obtained. Prior Anticoagulants: The patient                            last took aspirin 4 days prior to the procedure.                            ASA Grade Assessment: II - A patient with mild  systemic disease. After reviewing the risks and                            benefits, the patient was deemed in satisfactory                            condition to undergo the procedure.                           After obtaining informed consent, the endoscope was                            passed under direct vision. Throughout the                            procedure, the  patient's blood pressure, pulse, and                            oxygen saturations were monitored continuously. The                            [id number] scope was introduced through the                            mouth, and advanced to the second part of duodenum.                            The upper GI endoscopy was accomplished without                            difficulty. The patient tolerated the procedure                            well. Scope In: 11:05:41 AM Scope Out: 11:15:40 AM Total Procedure Duration: 0 hours 9 minutes 59 seconds  Findings:      The examined esophagus was normal.      The Z-line was irregular and was found 36 cm from the incisors.      A few 3 to 7 mm sessile polyps with no stigmata of recent bleeding were       found in the gastric fundus and in the gastric body.      The exam of the stomach was otherwise normal.      The duodenal bulb and second portion of the duodenum were normal. Impression:               - Normal esophagus.                           - Z-line irregular, 36 cm from the incisors.                           - A few gastric polyps. These polyps were biopsied                            back in November  2012 and were fundic gland polyps.                           - Normal duodenal bulb and second portion of the                            duodenum.                           - No specimens collected. Moderate Sedation:      Moderate (conscious) sedation was administered by the endoscopy nurse       and supervised by the endoscopist. The following parameters were       monitored: oxygen saturation, heart rate, blood pressure, CO2       capnography and response to care. Total physician intraservice time was       14 minutes. Recommendation:           - Patient has a contact number available for                            emergencies. The signs and symptoms of potential                            delayed complications were discussed with  the                            patient. Return to normal activities tomorrow.                            Written discharge instructions were provided to the                            patient.                           - Resume previous diet today.                           - Continue present medications.                           - Use sucralfate tablets 1 gram PO QID.                           - Telephone GI clinic in 2 weeks. Procedure Code(s):        --- Professional ---                           [phone], Esophagogastroduodenoscopy, flexible,                            transoral; diagnostic, including collection of                            specimen(s) by brushing or washing, when performed                            (  separate procedure)                           [id number], Moderate sedation services provided by the                            same physician or other qualified health care                            professional performing the diagnostic or                            therapeutic service that the sedation supports,                            requiring the presence of an independent trained                            observer to assist in the monitoring of the                            patient's level of consciousness and physiological                            status; initial 15 minutes of intraservice time,                            patient age 92 years or older Diagnosis Code(s):        --- Professional ---                           K22.8, Other specified diseases of esophagus                           K31.7, Polyp of stomach and duodenum                           R10.13, Epigastric pain CPT copyright 2016 American Medical Association. All rights reserved. The codes documented in this report are preliminary and upon coder review may  be revised to meet current compliance requirements. Hildred Laser, MD Hildred Laser, MD 05/27/2016 11:23:57 AM This report has been  signed electronically. Number of Addenda: 0

## 2016-05-27 NOTE — H&P (Signed)
Terri Douglas is an 70 y.o. female.   Chief Complaint: Patient is here for EGD. HPI: Patient is 70 year old Caucasian female who presents with recurrent epigastric and upper abdominal pain which seemed to get worse after meals. Pain started about 3 months ago. She is not having any pain today. She denies nausea vomiting melena or rectal bleeding. She has chronic constipation and takes medications with good results. She says she lost 20 pounds last year prior to gallbladder surgery and she has managed to keep it down. She describes the pain to be different pain. Patient states heartburns well controlled with PPI. She is on low-dose aspirin but does not take other OTC NSAIDs.  Past Medical History:  Diagnosis Date  . Arthritis   . Gall stones   . GERD (gastroesophageal reflux disease)    tums every now and then  . High cholesterol       . Hypertension   . Insomnia   . Myocardial infarction    "mild MI 2005"  . PONV (postoperative nausea and vomiting)   .    Marland Kitchen      Past Surgical History:  Procedure Laterality Date  . ABDOMINAL HYSTERECTOMY    . BACK SURGERY  1992  . CARDIAC CATHETERIZATION     BACK IN 2004  SHE THINKS IT CAME BACK 'NORMAL'  . CHOLECYSTECTOMY N/A 06/24/2015   Procedure: LAPAROSCOPIC CHOLECYSTECTOMY;  Surgeon: Mickeal Skinner, MD;  Location: [facility];  Service: General;  Laterality: N/A;  . COLONOSCOPY N/A 08/30/2012   Procedure: COLONOSCOPY;  Surgeon: Rogene Houston, MD;  Location: [facility];  Service: Endoscopy;  Laterality: N/A;  830  . DILATION AND CURETTAGE OF UTERUS    . ESOPHAGOGASTRODUODENOSCOPY  04/15/2011   Procedure: ESOPHAGOGASTRODUODENOSCOPY (EGD);  Surgeon: Rogene Houston, MD;  Location: [facility];  Service: Endoscopy;  Laterality: N/A;  11:30  . EYE SURGERY     cataract removal  . NECK SURGERY  x3  . vaginal hysterectomyh      Family History  Problem Relation Age of Onset  . Anesthesia problems Neg Hx   . Hypotension Neg Hx   .  Malignant hyperthermia Neg Hx   . Pseudochol deficiency Neg Hx    Social History:  reports that she has never smoked. She has never used smokeless tobacco. She reports that she does not drink alcohol or use drugs.  Allergies:  Allergies  Allergen Reactions  . Phenobarbital     Causes blurred vision    Medications Prior to Admission  Medication Sig Dispense Refill  . allopurinol (ZYLOPRIM) 100 MG tablet TAKE TWO TABLETS BY MOUTH DAILY (Patient taking differently: Take 100mg  by mouth twice daily) 180 tablet 0  . ALPRAZolam (XANAX) 1 MG tablet Take 1 tablet (1 mg total) by mouth at bedtime as needed. 90 tablet 3  . aspirin 81 MG tablet Take 81 mg by mouth every evening.     . Biotin 1000 MCG tablet Take 1,000 mcg by mouth daily.     . Cholecalciferol (VITAMIN D-3 PO) Take 1 tablet by mouth daily.     Marland Kitchen estradiol (ESTRACE) 1 MG tablet Take 1 mg by mouth daily.    . finasteride (PROSCAR) 5 MG tablet Take 2.5 mg by mouth daily.   4  . minoxidil (ROGAINE) 2 % external solution Apply 1 application topically daily.     Marland Kitchen OVER THE COUNTER MEDICATION Take 1 tablet by mouth daily. Colon health probiotic    . pantoprazole (  PROTONIX) 40 MG tablet Take 1 tablet (40 mg total) by mouth daily. 30 tablet 3  . potassium chloride (K-DUR) 10 MEQ tablet Take 1 tablet (10 mEq total) by mouth daily. 30 tablet 6  . pravastatin (PRAVACHOL) 80 MG tablet Take 90 mg by mouth at bedtime.  0  . PREMARIN vaginal cream Apply 1 application topically every other day.  1  . torsemide (DEMADEX) 20 MG tablet TAKE ONE TABLET BY MOUTH EVERY MORNING 90 tablet 0    No results found for this or any previous visit (from the past 48 hour(s)). No results found.  ROS  Blood pressure (!) 149/71, pulse 63, temperature 97.9 F (36.6 C), temperature source Oral, resp. rate 15, height 5\' 2"  (1.575 m), weight 132 lb (59.9 kg), SpO2 100 %. Physical Exam  Constitutional: She appears well-developed and well-nourished.  HENT:   Mouth/Throat: Oropharynx is clear and moist.  Eyes: Conjunctivae are normal. No scleral icterus.  Neck: No thyromegaly present.  Cardiovascular: Normal rate, regular rhythm and normal heart sounds.   No murmur heard. Respiratory: Effort normal and breath sounds normal.  GI:  Abdomen is symmetrical soft and nontender without organomegaly or masses.  Lymphadenopathy:    She has no cervical adenopathy.     Assessment/Plan  Epigastric pain. Diagnostic EGD.  Hildred Laser, MD 05/27/2016, 10:53 AM

## 2016-05-27 NOTE — Discharge Instructions (Signed)
Resume usual medications including aspirin. Sucralfate 1 g by mouth before meals and daily at bedtime.. No driving for 24 hours. Please call office with progress report in 2 weeks.     Esophagogastroduodenoscopy, Care After Introduction Refer to this sheet in the next few weeks. These instructions provide you with information about caring for yourself after your procedure. Your health care provider may also give you more specific instructions. Your treatment has been planned according to current medical practices, but problems sometimes occur. Call your health care provider if you have any problems or questions after your procedure. What can I expect after the procedure? After the procedure, it is common to have:  A sore throat.  Nausea.  Bloating.  Dizziness.  Fatigue. Follow these instructions at home:  Do not eat or drink anything until the numbing medicine (local anesthetic) has worn off and your gag reflex has returned. You will know that the local anesthetic has worn off when you can swallow comfortably.  Do not drive for 24 hours if you received a medicine to help you relax (sedative).  If your health care provider took a tissue sample for testing during the procedure, make sure to get your test results. This is your responsibility. Ask your health care provider or the department performing the test when your results will be ready.  Keep all follow-up visits as told by your health care provider. This is important. Contact a health care provider if:  You cannot stop coughing.  You are not urinating.  You are urinating less than usual. Get help right away if:  You have trouble swallowing.  You cannot eat or drink.  You have throat or chest pain that gets worse.  You are dizzy or light-headed.  You faint.  You have nausea or vomiting.  You have chills.  You have a fever.  You have severe abdominal pain.  You have black, tarry, or bloody stools. This  information is not intended to replace advice given to you by your health care provider. Make sure you discuss any questions you have with your health care provider. Document Released: 05/09/2012 Document Revised: 10/29/2015 Document Reviewed: 04/16/2015  2017 Elsevier

## 2016-06-01 ENCOUNTER — Telehealth (INDEPENDENT_AMBULATORY_CARE_PROVIDER_SITE_OTHER): Payer: Self-pay | Admitting: *Deleted

## 2016-06-01 NOTE — Telephone Encounter (Signed)
Per EGD op note, patient needs OV in 2 weeks

## 2016-06-02 ENCOUNTER — Encounter (HOSPITAL_COMMUNITY): Payer: Self-pay | Admitting: Internal Medicine

## 2016-06-02 ENCOUNTER — Encounter (INDEPENDENT_AMBULATORY_CARE_PROVIDER_SITE_OTHER): Payer: Self-pay | Admitting: Internal Medicine

## 2016-06-02 NOTE — Telephone Encounter (Signed)
Patient was given an appointment with Dr. Laural Golden for 06/21/16 at 3:15pm.  A letter was mailed to the patient.

## 2016-06-21 ENCOUNTER — Encounter (INDEPENDENT_AMBULATORY_CARE_PROVIDER_SITE_OTHER): Payer: Self-pay | Admitting: Internal Medicine

## 2016-06-21 ENCOUNTER — Ambulatory Visit (INDEPENDENT_AMBULATORY_CARE_PROVIDER_SITE_OTHER): Payer: Medicare Other | Admitting: Internal Medicine

## 2016-06-21 ENCOUNTER — Encounter (INDEPENDENT_AMBULATORY_CARE_PROVIDER_SITE_OTHER): Payer: Self-pay

## 2016-06-21 VITALS — BP 114/74 | HR 68 | Temp 98.2°F | Resp 18 | Ht 62.0 in | Wt 136.6 lb

## 2016-06-21 DIAGNOSIS — R1013 Epigastric pain: Secondary | ICD-10-CM | POA: Diagnosis not present

## 2016-06-21 DIAGNOSIS — K219 Gastro-esophageal reflux disease without esophagitis: Secondary | ICD-10-CM

## 2016-06-21 DIAGNOSIS — R490 Dysphonia: Secondary | ICD-10-CM

## 2016-06-21 MED ORDER — PANTOPRAZOLE SODIUM 40 MG PO TBEC: 40.0000 mg | DELAYED_RELEASE_TABLET | Freq: Two times a day (BID) | ORAL | 3 refills | Status: DC

## 2016-06-21 NOTE — Patient Instructions (Signed)
Take sucralfate 1 g every morning and 1 g at bedtime. Can use dose prior to lunch if needed. Please call office with progress report in 2 months regarding hoarseness and bloating. Increase intake of fiber rich foods. Consider eating apple every day.

## 2016-06-21 NOTE — Progress Notes (Signed)
Presenting complaint;  Follow-up for epigastric pain.  Subjective:  Patient is 71 year old Caucasian female who is here for scheduled visit. She underwent esophagogastroduodenoscopy on 05/27/2016 for epigastric pain not responding to therapy. EGD revealed few fundic gland polyps no evidence of peptic ulcer disease. She was begun on sucralfate. She is taking at least 3 doses per day. She feels 75% better. Pain is mild and not occurring every day. She is not having heartburn so throat or cough but she has hoarseness. She also complains of intermittent epistaxis from right nares. Her appetite is good. She has not lost any weight recently. She is on Colace but she feels she is not having good results.    Current Medications: Outpatient Encounter Prescriptions as of 06/21/2016  Medication Sig  . allopurinol (ZYLOPRIM) 100 MG tablet TAKE TWO TABLETS BY MOUTH DAILY (Patient taking differently: Take 100mg  by mouth twice daily)  . ALPRAZolam (XANAX) 1 MG tablet Take 1 tablet (1 mg total) by mouth at bedtime as needed.  Marland Kitchen aspirin 81 MG tablet Take 81 mg by mouth every evening.   . Biotin 1000 MCG tablet Take 5,000 mcg by mouth daily.   . Cholecalciferol (VITAMIN D-3 PO) Take 1 tablet by mouth daily.   Mariane Baumgarten Sodium (PHILLIPS STOOL SOFTENER PO) Take by mouth. Patient states that she takes 2 by mouth at night.  . estradiol (ESTRACE) 1 MG tablet Take 1 mg by mouth daily.  . finasteride (PROSCAR) 5 MG tablet Take 2.5 mg by mouth daily.   Marland Kitchen loratadine (CLARITIN) 10 MG tablet Take 10 mg by mouth daily.  . minoxidil (ROGAINE) 2 % external solution Apply 1 application topically daily.   Marland Kitchen OVER THE COUNTER MEDICATION Take 1 tablet by mouth daily. Colon health probiotic  . OVER THE COUNTER MEDICATION 25 mg. Sennosides Stimulant Laxative - Patient takes 1 by mouth every night.  . pantoprazole (PROTONIX) 40 MG tablet Take 1 tablet (40 mg total) by mouth daily.  . potassium chloride (K-DUR) 10 MEQ tablet  Take 1 tablet (10 mEq total) by mouth daily.  . pravastatin (PRAVACHOL) 80 MG tablet Take 80 mg by mouth at bedtime.   Marland Kitchen PREMARIN vaginal cream Apply 1 application topically every other day.  . sucralfate (CARAFATE) 1 g tablet Take 1 tablet (1 g total) by mouth 4 (four) times daily -  with meals and at bedtime.  . torsemide (DEMADEX) 20 MG tablet TAKE ONE TABLET BY MOUTH EVERY MORNING   No facility-administered encounter medications on file as of 06/21/2016.      Objective: Blood pressure 114/74, pulse 68, temperature 98.2 F (36.8 C), temperature source Oral, resp. rate 18, height 5\' 2"  (1.575 m), weight 136 lb 9.6 oz (62 kg). Patient is alert and in no acute distress. Conjunctiva is pink. Sclera is nonicteric Oropharyngeal mucosa is normal. No neck masses or thyromegaly noted. Cardiac exam with regular rhythm normal S1 and S2. No murmur or gallop noted. Lungs are clear to auscultation. Abdomen is symmetrical soft and nontender without organomegaly or masses.  No LE edema or clubbing noted.   Assessment:  #1. Epigastric pain has responded to sucralfate. Pain therefore may be due to due to no gastric bile reflux. She will try decreasing dose to  twice e a day. #2. GERD. Typical symptoms are well controlled with therapy. #3. Hoarseness. This symptom may be secondary to GERD in which case single dose PPI generally is not effective.    Plan:  Try sucralfate 1 g before breakfast  and 1 g at bedtime. Used third or fourth dose as needed. Increase pantoprazole to 40 mg by mouth twice a day. Increase intake of fiber rich foods. Progress report in 2 months. Physician in 6 months.

## 2016-07-08 ENCOUNTER — Other Ambulatory Visit: Payer: Self-pay | Admitting: Family Medicine

## 2016-07-12 DIAGNOSIS — Z01419 Encounter for gynecological examination (general) (routine) without abnormal findings: Secondary | ICD-10-CM | POA: Diagnosis not present

## 2016-07-12 DIAGNOSIS — Z6825 Body mass index (BMI) 25.0-25.9, adult: Secondary | ICD-10-CM | POA: Diagnosis not present

## 2016-07-19 ENCOUNTER — Encounter: Payer: Self-pay | Admitting: Family Medicine

## 2016-07-19 ENCOUNTER — Ambulatory Visit (INDEPENDENT_AMBULATORY_CARE_PROVIDER_SITE_OTHER): Payer: Medicare Other | Admitting: Family Medicine

## 2016-07-19 ENCOUNTER — Ambulatory Visit (HOSPITAL_COMMUNITY)
Admission: RE | Admit: 2016-07-19 | Discharge: 2016-07-19 | Disposition: A | Discharge status: Home / Self Care | Payer: Medicare Other | Source: Ambulatory Visit | Attending: Family Medicine | Admitting: Family Medicine

## 2016-07-19 VITALS — BP 122/80 | Temp 97.8°F | Ht 62.0 in | Wt 137.4 lb

## 2016-07-19 DIAGNOSIS — M7989 Other specified soft tissue disorders: Secondary | ICD-10-CM | POA: Diagnosis not present

## 2016-07-19 DIAGNOSIS — D1779 Benign lipomatous neoplasm of other sites: Secondary | ICD-10-CM

## 2016-07-19 DIAGNOSIS — M79652 Pain in left thigh: Secondary | ICD-10-CM | POA: Diagnosis not present

## 2016-07-19 DIAGNOSIS — M79605 Pain in left leg: Secondary | ICD-10-CM

## 2016-07-19 DIAGNOSIS — D1720 Benign lipomatous neoplasm of skin and subcutaneous tissue of unspecified limb: Secondary | ICD-10-CM

## 2016-07-19 NOTE — Progress Notes (Signed)
   Subjective:    Patient ID: Terri Douglas, female    DOB: July 22, 1945, 71 y.o.   MRN: [id number]  HPI Patient is here today for a knot on her upper left leg. Onset 1-2 weeks ago. Treatments tried: none Patient has no other concerns at this time.  This patient states she notices it about a couple weeks ago. She states it does not hurt it does not get pain or discomfort. She just notices it is different on the left side compared to the right side.  Over the past year she has lost weight through healthy eating regular physical activity. She denies any hip pain or leg pain denies fever chills sweats. She is worried about the possibility of cancer  Review of Systems    please see above Objective:   Physical Exam Her lungs clear hearts regular hips appear normal Low back nontender There appears to be a fibrous tissue density in the left hip region more pronounced above the left trochanter compared to the right side. It is smooth and soft. 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

## 2016-08-03 ENCOUNTER — Ambulatory Visit: Payer: Medicare Other | Admitting: Family Medicine

## 2016-08-09 ENCOUNTER — Encounter: Payer: Self-pay | Admitting: Family Medicine

## 2016-08-09 ENCOUNTER — Ambulatory Visit (INDEPENDENT_AMBULATORY_CARE_PROVIDER_SITE_OTHER): Payer: Medicare Other | Admitting: Family Medicine

## 2016-08-09 ENCOUNTER — Other Ambulatory Visit: Payer: Self-pay

## 2016-08-09 VITALS — BP 120/74 | Ht 62.0 in | Wt 138.1 lb

## 2016-08-09 DIAGNOSIS — D179 Benign lipomatous neoplasm, unspecified: Secondary | ICD-10-CM

## 2016-08-09 DIAGNOSIS — R6889 Other general symptoms and signs: Secondary | ICD-10-CM

## 2016-08-09 DIAGNOSIS — M799 Soft tissue disorder, unspecified: Secondary | ICD-10-CM

## 2016-08-09 DIAGNOSIS — M7989 Other specified soft tissue disorders: Secondary | ICD-10-CM

## 2016-08-09 MED ORDER — POTASSIUM CHLORIDE ER 10 MEQ PO TBCR: EXTENDED_RELEASE_TABLET | ORAL | 3 refills | Status: DC

## 2016-08-09 NOTE — Progress Notes (Addendum)
   Subjective:    Patient ID: Terri Douglas, female    DOB: Sep 11, 1945, 71 y.o.   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 On previous examination it was hard to discern if it was a developing cyst or lipoma had some firmness to it a plain x-ray was done did not show any bone deterioration but obviously an MRI would be necessary to help delineate what is causing this long discussion was held with the patient today how it is impossible to be [id number] certain without doing an MRI. Patient does not one have surgery unless absolutely necessary.   Patient needs a 90 day refill on her potassium.    Patient states that her hands stay cold all the time also.  She relates at times her hands get cold at times feels chilled for no good reason denies any other particular troubles currently  Review of Systems Please see above denies chest tightness pressure pain shortness breath    Objective:   Physical Exam Lungs clear heart regular circulation in the hand is good both ulnar and radial artery Suspected Lipoma on the left hip approximately 1/2 inch in diameter some softness yet at the same time has some areas of firmness it is impossible to tell with certainty if this is a lipoma it is certainly a soft tissue growth.   On examination unable to exclude the possibility that this could be coming from the muscle and possibly a sarcoma    Assessment & Plan:  According to radiology and according to national guidelines in order to get a better feel for what is causing this soft tissue mass it is necessary to do an MRI to help delineate what is causing this may or may not need surgery depending on results  Potassium refill  Cold intolerance this could be a natural part of aging but also check thyroid function

## 2016-08-10 LAB — T4, FREE: Free T4: 1.45 ng/dL (ref 0.82–1.77)

## 2016-08-10 LAB — TSH: TSH: 1.98 u[IU]/mL (ref 0.450–4.500)

## 2016-08-11 ENCOUNTER — Other Ambulatory Visit: Payer: Self-pay | Admitting: Family Medicine

## 2016-08-15 ENCOUNTER — Ambulatory Visit (HOSPITAL_COMMUNITY)
Admission: RE | Admit: 2016-08-15 | Discharge: 2016-08-15 | Disposition: A | Discharge status: Home / Self Care | Payer: Medicare Other | Source: Ambulatory Visit | Attending: Family Medicine | Admitting: Family Medicine

## 2016-08-15 DIAGNOSIS — D179 Benign lipomatous neoplasm, unspecified: Secondary | ICD-10-CM | POA: Insufficient documentation

## 2016-08-15 DIAGNOSIS — R1902 Left upper quadrant abdominal swelling, mass and lump: Secondary | ICD-10-CM | POA: Diagnosis not present

## 2016-08-15 LAB — POCT I-STAT CREATININE: Creatinine, Ser: 1.1 mg/dL — ABNORMAL HIGH (ref 0.44–1.00)

## 2016-08-15 MED ORDER — GADOBENATE DIMEGLUMINE 529 MG/ML IV SOLN
15.0000 mL | Freq: Once | INTRAVENOUS | Status: AC | PRN
  Administered 2016-08-15: 13 mL via INTRAVENOUS

## 2016-08-16 ENCOUNTER — Ambulatory Visit: Payer: Medicare Other | Admitting: Family Medicine

## 2016-08-29 ENCOUNTER — Other Ambulatory Visit: Payer: Self-pay | Admitting: Family Medicine

## 2016-08-29 NOTE — Telephone Encounter (Signed)
Normally I dont rx 90 d worth of night time controlled substance but since apparently we have been doing this ok times one

## 2016-09-05 ENCOUNTER — Telehealth: Payer: Self-pay | Admitting: Family Medicine

## 2016-09-05 ENCOUNTER — Other Ambulatory Visit: Payer: Self-pay

## 2016-09-05 MED ORDER — CEFPROZIL 500 MG PO TABS: ORAL_TABLET | ORAL | 0 refills | Status: DC

## 2016-09-05 MED ORDER — PHENAZOPYRIDINE HCL 200 MG PO TABS: 200.0000 mg | ORAL_TABLET | Freq: Three times a day (TID) | ORAL | 0 refills | Status: DC | PRN

## 2016-09-05 NOTE — Telephone Encounter (Signed)
I would recommend Cefzil 500 mg 1 twice a day for 5 days I also recommend Pyridium 200 mg 1 3 times a day when necessary-#15-let the patient know that the peridium helps take away the burning will make her urine turn a on range red color she can take it 3 times a day for the next 2-3 days then after that she can stop the peridium-the antibiotic should be taken for all 5 days-if high fevers or if worse follow-up

## 2016-09-05 NOTE — Telephone Encounter (Signed)
Spoke with patient and informed her per Dr.Scott Luking- Dr.Scott would recommend Cefzil 500 mg 1 twice a day for 5 days he also recommend Pyridium 200 mg 1 3 times a day when necessary-pyridium helps take away the burning will make her urine turn a  orange red color she can take it 3 times a day for the next 2-3 days then after that she can stop the peridium-the antibiotic should be taken for all 5 days-if high fevers or if worse follow-up. Patient verbalized understanding.

## 2016-09-05 NOTE — Telephone Encounter (Signed)
Patient is having burning with urination and urgency.  She said she thinks she has a UTI but is currently out of town.  She said Dr. Nicki Reaper has called in antibiotics for her before.  She wants to know if we can do this for soon.  [place] (p) [phone]

## 2016-09-06 ENCOUNTER — Encounter

## 2016-09-14 ENCOUNTER — Inpatient Hospital Stay: Admit: 2016-09-14 | Payer: MEDICARE | Attending: Physician Assistant | Primary: Physician Assistant

## 2016-09-14 DIAGNOSIS — I10 Essential (primary) hypertension: Secondary | ICD-10-CM

## 2016-09-14 NOTE — Procedures (Signed)
[facility]  *** FINAL REPORT ***    Name: Krystal Hood, Krystal Hood  MRN: [id number]    Outpatient  DOB: Jun 26, 1945  HIS Order #: [id number]  [place] Visit #: [id number]  Date: 14 Sep 2016    TYPE OF TEST: Visceral Arterial Duplex    REASON FOR TEST    Aortic PSV:  76.0 cm/s  Diameter AP: 1.7 cm   TV: 1.7 cm                   Right          Left  Renal Artery:- -------------  -------------  Proximal  PSV:  53.0           42.0  Mid       PSV: 135.0           56.0  Distal    PSV: 147.0           46.0  Aortic ratio :   1.9            0.7    Medullary PSV:  37.0           33.0            EDV:  11.0           13.0            EDR:   0.3            0.4            SDR:   3.4            2.5    Cortical  PSV:  28.0           25.0            EDV:   9.0            8.0            EDR:   0.3            0.3            SDR:   3.1            3.1  Stenosis:      Normal         Normal  Kidney size:   10.3 cm        10.6 cm               x  3.7 cm      x  4.7 cm    Hilar:-        Right          Left  Acc. Time  AT:   4 secs         4 secs  Acc. Index AI:             RI: 0.78           0.71    INTERPRETATION/FINDINGS  Duplex images were obtained using 2-D gray scale, color flow, and  spectral Doppler analysis.  RENAL:  1. No significant renal artery stenosis identified, both renal  arteries visualized.  2. The right kidney measures 10.3 cm.  3. The left kidney measures 10.6 cm.  4. The renal vein is patent bilaterally.  5.  No evidence of aneurysm noted in the abdominal aorta.    ADDITIONAL COMMENTS    I have personally reviewed the data relevant to the interpretation of  this  study.    TECHNOLOGIST: Patsey Berthold, [facility], RVT/  Signed: 09/14/2016 08:45 AM    PHYSICIAN: Baldemar Lenis. Janalyn Harder, MD  Signed: 09/14/2016 02:18 PM

## 2016-09-14 NOTE — Procedures (Signed)
[facility]  *** FINAL REPORT ***    Name: RAMONDA, GALYON  MRN: [id number]    Outpatient  DOB: Jan 17, 1946  HIS Order #: [id number]  [place] Visit #: [id number]  Date: 14 Sep 2016    TYPE OF TEST: Visceral Arterial Duplex    REASON FOR TEST    Aortic PSV:  76.0 cm/s  Diameter AP: 1.7 cm   TV: 1.7 cm                   Right          Left  Renal Artery:- -------------  -------------  Proximal  PSV:  53.0           42.0  Mid       PSV: 135.0           56.0  Distal    PSV: 147.0           46.0  Aortic ratio :   1.9            0.7    Medullary PSV:  37.0           33.0            EDV:  11.0           13.0            EDR:   0.3            0.4            SDR:   3.4            2.5    Cortical  PSV:  28.0           25.0            EDV:   9.0            8.0            EDR:   0.3            0.3            SDR:   3.1            3.1  Stenosis:      Normal         Normal  Kidney size:   10.3 cm        10.6 cm               x  3.7 cm      x  4.7 cm    Hilar:-        Right          Left  Acc. Time  AT:   4 secs         4 secs  Acc. Index AI:             RI: 0.78           0.71    INTERPRETATION/FINDINGS  Duplex images were obtained using 2-D gray scale, color flow, and  spectral Doppler analysis.  RENAL:  1. No significant renal artery stenosis identified, both renal  arteries visualized.  2. The right kidney measures 10.3 cm.  3. The left kidney measures 10.6 cm.  4. The renal vein is patent bilaterally.  5.  No evidence of aneurysm noted in the abdominal aorta.    ADDITIONAL COMMENTS    I have personally reviewed the data relevant to the interpretation of  this  study.    TECHNOLOGIST: Patsey Berthold, [facility], RVT/  Signed: 09/14/2016 08:45 AM    PHYSICIAN: Baldemar Lenis. Janalyn Harder, MD  Signed: 09/14/2016 02:18 PM

## 2016-09-20 ENCOUNTER — Encounter

## 2016-09-23 ENCOUNTER — Encounter

## 2016-09-23 ENCOUNTER — Other Ambulatory Visit: Payer: Self-pay | Admitting: Family Medicine

## 2016-09-26 ENCOUNTER — Encounter

## 2016-09-27 ENCOUNTER — Other Ambulatory Visit: Payer: Self-pay | Admitting: Family Medicine

## 2016-09-27 DIAGNOSIS — L859 Epidermal thickening, unspecified: Secondary | ICD-10-CM | POA: Diagnosis not present

## 2016-09-27 DIAGNOSIS — L658 Other specified nonscarring hair loss: Secondary | ICD-10-CM | POA: Diagnosis not present

## 2016-09-27 DIAGNOSIS — L650 Telogen effluvium: Secondary | ICD-10-CM | POA: Diagnosis not present

## 2016-09-27 DIAGNOSIS — D2261 Melanocytic nevi of right upper limb, including shoulder: Secondary | ICD-10-CM | POA: Diagnosis not present

## 2016-09-27 DIAGNOSIS — D485 Neoplasm of uncertain behavior of skin: Secondary | ICD-10-CM | POA: Diagnosis not present

## 2016-09-27 DIAGNOSIS — D224 Melanocytic nevi of scalp and neck: Secondary | ICD-10-CM | POA: Diagnosis not present

## 2016-09-27 DIAGNOSIS — L821 Other seborrheic keratosis: Secondary | ICD-10-CM | POA: Diagnosis not present

## 2016-09-27 DIAGNOSIS — Z85828 Personal history of other malignant neoplasm of skin: Secondary | ICD-10-CM | POA: Diagnosis not present

## 2016-09-27 DIAGNOSIS — C44619 Basal cell carcinoma of skin of left upper limb, including shoulder: Secondary | ICD-10-CM | POA: Diagnosis not present

## 2016-09-27 DIAGNOSIS — L603 Nail dystrophy: Secondary | ICD-10-CM | POA: Diagnosis not present

## 2016-09-27 DIAGNOSIS — L814 Other melanin hyperpigmentation: Secondary | ICD-10-CM | POA: Diagnosis not present

## 2016-09-27 DIAGNOSIS — D2271 Melanocytic nevi of right lower limb, including hip: Secondary | ICD-10-CM | POA: Diagnosis not present

## 2016-09-28 ENCOUNTER — Encounter

## 2016-09-29 ENCOUNTER — Inpatient Hospital Stay: Payer: MEDICARE | Attending: Physician Assistant | Primary: Physician Assistant

## 2016-09-29 ENCOUNTER — Inpatient Hospital Stay: Admit: 2016-09-29 | Payer: MEDICARE | Attending: Physician Assistant | Primary: Physician Assistant

## 2016-09-29 DIAGNOSIS — R0602 Shortness of breath: Secondary | ICD-10-CM

## 2016-09-29 LAB — ECHO TTE STRESS EXERCISE TREADMILL COMP
ECG Interp. Before Exercise: NORMAL
Max. Diastolic BP: 80 mmHg
Max. Heart rate: 151 {beats}/min
Max. Systolic BP: 180 mmHg
Peak Ex METs: 7 METS

## 2016-09-29 NOTE — Progress Notes (Signed)
Exercise stress echo completed. Report to follow.

## 2016-11-15 DIAGNOSIS — Z1231 Encounter for screening mammogram for malignant neoplasm of breast: Secondary | ICD-10-CM | POA: Diagnosis not present

## 2016-11-15 DIAGNOSIS — Z779 Other contact with and (suspected) exposures hazardous to health: Secondary | ICD-10-CM | POA: Diagnosis not present

## 2016-11-17 DIAGNOSIS — C44619 Basal cell carcinoma of skin of left upper limb, including shoulder: Secondary | ICD-10-CM | POA: Diagnosis not present

## 2016-11-18 ENCOUNTER — Other Ambulatory Visit: Payer: Self-pay | Admitting: Family Medicine

## 2016-12-12 ENCOUNTER — Other Ambulatory Visit: Payer: Self-pay | Admitting: Family Medicine

## 2016-12-12 NOTE — Telephone Encounter (Signed)
May have this +2 refills 

## 2016-12-13 ENCOUNTER — Other Ambulatory Visit: Payer: Self-pay | Admitting: Family Medicine

## 2016-12-20 ENCOUNTER — Encounter (INDEPENDENT_AMBULATORY_CARE_PROVIDER_SITE_OTHER): Payer: Self-pay | Admitting: Internal Medicine

## 2016-12-20 ENCOUNTER — Ambulatory Visit (INDEPENDENT_AMBULATORY_CARE_PROVIDER_SITE_OTHER): Payer: Medicare Other | Admitting: Internal Medicine

## 2016-12-20 VITALS — BP 124/82 | HR 76 | Temp 97.3°F | Resp 16 | Ht 62.0 in | Wt 135.1 lb

## 2016-12-20 DIAGNOSIS — R1013 Epigastric pain: Secondary | ICD-10-CM | POA: Diagnosis not present

## 2016-12-20 DIAGNOSIS — R490 Dysphonia: Secondary | ICD-10-CM | POA: Diagnosis not present

## 2016-12-20 DIAGNOSIS — K219 Gastro-esophageal reflux disease without esophagitis: Secondary | ICD-10-CM | POA: Diagnosis not present

## 2016-12-20 MED ORDER — SUCRALFATE 1 G PO TABS: 1.0000 g | ORAL_TABLET | Freq: Two times a day (BID) | ORAL | 2 refills | Status: DC | PRN

## 2016-12-20 NOTE — Progress Notes (Signed)
Presenting complaint;  Follow-up for GERD and epigastric pain.  Subjective:  Terri Douglas is 71 year old Caucasian female who is here for scheduled visit. She was last seen about 6 months ago. Her last visit PPI dose was doubled. She cannot tell any improvement in her hoarseness. She rarely experiences heartburn. She denies sore throat or chronic cough. She has dysphagia with pills but not with food. She has history of chronic constipation in bowels move as long as she takes a laxative and a stool softer. She denies melena or rectal bleeding. She states Dr. Benjamine Mola because of hoarseness. She was then referred to [facility] but no therapy has made any difference.    Current Medications: Outpatient Encounter Prescriptions as of 12/20/2016  Medication Sig  . allopurinol (ZYLOPRIM) 100 MG tablet TAKE TWO TABLETS BY MOUTH DAILY  . allopurinol (ZYLOPRIM) 100 MG tablet TAKE TWO TABLETS BY MOUTH DAILY  . ALPRAZolam (XANAX) 1 MG tablet TAKE ONE TABLET DAILY AT BEDTIME AS NEEDED  . aspirin 81 MG tablet Take 81 mg by mouth every evening.   . Biotin 1000 MCG tablet Take 5,000 mcg by mouth daily.   . cefPROZIL (CEFZIL) 500 MG tablet Take 1 tablet by mouth twice a day for 5 days.  . Cholecalciferol (VITAMIN D-3 PO) Take 1 tablet by mouth daily.   Mariane Baumgarten Sodium (PHILLIPS STOOL SOFTENER PO) Take by mouth. Patient states that she takes 2 by mouth at night.  . estradiol (ESTRACE) 1 MG tablet Take 1 mg by mouth daily.  . finasteride (PROSCAR) 5 MG tablet Take 2.5 mg by mouth daily.   Marland Kitchen loratadine (CLARITIN) 10 MG tablet Take 10 mg by mouth daily.  . minoxidil (ROGAINE) 2 % external solution Apply 1 application topically daily.   Marland Kitchen OVER THE COUNTER MEDICATION Take 1 tablet by mouth daily. Colon health probiotic  . OVER THE COUNTER MEDICATION 25 mg. Sennosides Stimulant Laxative - Patient takes 1 by mouth every night.  . pantoprazole (PROTONIX) 40 MG tablet Take 1 tablet (40 mg total) by mouth 2 (two) times daily before a  meal.  . phenazopyridine (PYRIDIUM) 200 MG tablet Take 1 tablet (200 mg total) by mouth 3 (three) times daily as needed for pain.  . potassium chloride (K-DUR) 10 MEQ tablet TAKE ONE (1) TABLET BY MOUTH EVERY DAY  . pravastatin (PRAVACHOL) 80 MG tablet Take 80 mg by mouth at bedtime.   . pravastatin (PRAVACHOL) 80 MG tablet TAKE ONE (1) TABLET BY MOUTH EVERY DAY  . PREMARIN vaginal cream Apply 1 application topically every other day.  . sucralfate (CARAFATE) 1 g tablet Take 1 tablet (1 g total) by mouth 4 (four) times daily -  with meals and at bedtime.  . torsemide (DEMADEX) 20 MG tablet TAKE ONE TABLET BY MOUTH EVERY MORNING   No facility-administered encounter medications on file as of 12/20/2016.      Objective: Blood pressure 124/82, pulse 76, temperature (!) 97.3 F (36.3 C), resp. rate 16, height 5\' 2"  (1.575 m), weight 135 lb 1.6 oz (61.3 kg). Patient is alert and in no acute distress. She remains with hoarseness. Conjunctiva is pink. Sclera is nonicteric Oropharyngeal mucosa is normal. No neck masses or thyromegaly noted. Cardiac exam with regular rhythm normal S1 and S2. No murmur or gallop noted. Lungs are clear to auscultation. Abdomen is symmetrical soft and nontender without organomegaly or masses. No LE edema or clubbing noted.   Assessment:  #1. GERD. Typical symptoms are well controlled. Hoarseness may not be secondary  to GERD.  #2. Chronic epigastric pain. She responded to sucralfate which she is using once or twice daily. She can use this medication on demand.  #3. Hoarseness. Typical GERD symptoms have responded to anti-reflex therapy with hoarseness has not. She has been thoroughly evaluated by Dr. Benjamine Mola who referred her to [facility] Dr. 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 She decided not to go back for further treatment sessions.   Plan:  Medication list updated. Following medications were  removed from her list. Biotene Cefzil vitamin D Claritin and Pyridium. Trial with Dexilant 60 mg by mouth every morning. 2 week supply given. Patient will call office with progress report in 2 weeks. Unless she notices dramatic improvement with hoarseness she can go back on pantoprazole twice a day. You sucralfate 1 g by mouth twice a day when necessary. Office visit in one year.

## 2016-12-20 NOTE — Patient Instructions (Addendum)
Can use sucralfate or Carafate on as-needed basis(for epigastric pain). Take Dexilant 60 mg by mouth 30 minutes before breakfast daily. Do not take pantoprazole while taking this medication. Call with progress report in 2 weeks.

## 2016-12-26 ENCOUNTER — Other Ambulatory Visit: Payer: Self-pay

## 2017-01-03 ENCOUNTER — Encounter

## 2017-01-05 ENCOUNTER — Encounter

## 2017-01-16 ENCOUNTER — Telehealth (INDEPENDENT_AMBULATORY_CARE_PROVIDER_SITE_OTHER): Payer: Self-pay | Admitting: Internal Medicine

## 2017-01-16 NOTE — Telephone Encounter (Signed)
Patient called to give a progress report on the medication that Dr. Laural Golden prescribed.  She stated that she doesn't think the medication is helping.  She stated that she's not sure why he switched it.  The samples were capsule form, but the medication she got from the pharmacy was a big white tablet, she stated.  [phone]

## 2017-01-16 NOTE — Telephone Encounter (Signed)
Plan from [place] 07/17/2018Medication list updated. Following medications were removed from her list. Biotene Cefzil vitamin D Claritin and Pyridium. Trial with Dexilant 60 mg by mouth every morning. 2 week supply given. Patient will call office with progress report in 2 weeks. Unless she notices dramatic improvement with hoarseness she can go back on pantoprazole twice a day. You sucralfate 1 g by mouth twice a day when necessary. Office visit in one year.   Marland Kitchen

## 2017-01-18 NOTE — Telephone Encounter (Signed)
Patient was called and made aware. 

## 2017-03-01 ENCOUNTER — Inpatient Hospital Stay: Admit: 2017-03-01 | Payer: MEDICARE | Primary: Physician Assistant

## 2017-03-01 ENCOUNTER — Encounter

## 2017-03-01 DIAGNOSIS — Z01818 Encounter for other preprocedural examination: Secondary | ICD-10-CM

## 2017-03-01 LAB — METABOLIC PANEL, BASIC
Anion gap: 8 mmol/L (ref 3.0–18)
BUN/Creatinine ratio: 25 — ABNORMAL HIGH (ref 12–20)
BUN: 24 MG/DL — ABNORMAL HIGH (ref 7.0–18)
CO2: 29 mmol/L (ref 21–32)
Calcium: 9.3 MG/DL (ref 8.5–10.1)
Chloride: 103 mmol/L (ref 100–108)
Creatinine: 0.95 MG/DL (ref 0.6–1.3)
GFR est AA: >60 mL/min/{1.73_m2} (ref >60)
GFR est non-AA: 58 mL/min/{1.73_m2} — ABNORMAL LOW (ref >60)
Glucose: 102 mg/dL — ABNORMAL HIGH (ref 74–99)
Potassium: 3.8 mmol/L (ref 3.5–5.5)
Sodium: 140 mmol/L (ref 136–145)

## 2017-03-01 LAB — EKG, 12 LEAD, INITIAL
Atrial Rate: 61 {beats}/min
Calculated P Axis: 49 degrees
Calculated R Axis: -42 degrees
Calculated T Axis: 10 degrees
Diagnosis: NORMAL
P-R Interval: 166 ms
Q-T Interval: 440 ms
QRS Duration: 74 ms
QTC Calculation (Bezet): 442 ms
Ventricular Rate: 61 {beats}/min

## 2017-03-01 LAB — CBC W/O DIFF
HCT: 43 % (ref 35.0–45.0)
HGB: 14.7 g/dL (ref 12.0–16.0)
MCH: 30.6 PG (ref 24.0–34.0)
MCHC: 34.2 g/dL (ref 31.0–37.0)
MCV: 89.4 FL (ref 74.0–97.0)
MPV: 10.8 FL (ref 9.2–11.8)
PLATELET: 195 10*3/uL (ref 135–420)
RBC: 4.81 M/uL (ref 4.20–5.30)
RDW: 12.7 % (ref 11.6–14.5)
WBC: 6.6 10*3/uL (ref 4.6–13.2)

## 2017-03-01 LAB — EKG 12-LEAD
Atrial Rate: 61 {beats}/min
Diagnosis: NORMAL
P Axis: 49 degrees
P-R Interval: 166 ms
Q-T Interval: 440 ms
QRS Duration: 74 ms
QTc Calculation (Bazett): 442 ms
R Axis: -42 degrees
T Axis: 10 degrees
Ventricular Rate: 61 {beats}/min

## 2017-03-03 ENCOUNTER — Inpatient Hospital Stay: Admit: 2017-03-03 | Payer: MEDICARE | Attending: Physician Assistant | Primary: Physician Assistant

## 2017-03-03 ENCOUNTER — Other Ambulatory Visit: Payer: Self-pay | Admitting: Family Medicine

## 2017-03-03 ENCOUNTER — Ambulatory Visit

## 2017-03-03 DIAGNOSIS — M899 Disorder of bone, unspecified: Secondary | ICD-10-CM

## 2017-03-03 DIAGNOSIS — Z1231 Encounter for screening mammogram for malignant neoplasm of breast: Secondary | ICD-10-CM

## 2017-03-07 ENCOUNTER — Inpatient Hospital Stay: Admit: 2017-03-07 | Payer: MEDICARE | Primary: Physician Assistant

## 2017-03-07 ENCOUNTER — Encounter: Primary: Physician Assistant

## 2017-03-07 DIAGNOSIS — C44319 Basal cell carcinoma of skin of other parts of face: Secondary | ICD-10-CM

## 2017-03-20 DIAGNOSIS — Z23 Encounter for immunization: Secondary | ICD-10-CM | POA: Diagnosis not present

## 2017-04-05 ENCOUNTER — Other Ambulatory Visit: Payer: Self-pay | Admitting: Family Medicine

## 2017-05-12 ENCOUNTER — Telehealth: Payer: Self-pay | Admitting: Family Medicine

## 2017-05-12 ENCOUNTER — Telehealth: Payer: Self-pay

## 2017-05-12 DIAGNOSIS — Z13220 Encounter for screening for lipoid disorders: Secondary | ICD-10-CM

## 2017-05-12 DIAGNOSIS — R5383 Other fatigue: Secondary | ICD-10-CM

## 2017-05-12 DIAGNOSIS — Z79899 Other long term (current) drug therapy: Secondary | ICD-10-CM

## 2017-05-12 DIAGNOSIS — Z1159 Encounter for screening for other viral diseases: Secondary | ICD-10-CM

## 2017-05-12 MED ORDER — ALLOPURINOL 100 MG PO TABS: 200.0000 mg | ORAL_TABLET | Freq: Every day | ORAL | 1 refills | Status: DC

## 2017-05-12 NOTE — Telephone Encounter (Signed)
Patient is aware 

## 2017-05-12 NOTE — Telephone Encounter (Signed)
Pt is out of her medication and has an appt scheduled for 05/29/2017. Pt is needing a refill sent in to get her to her appt.   allopurinol (ZYLOPRIM) 100 MG tablet  pt is also needing lab orders sent over for this appt. Last labs per epic were: tsh and t4 on 08/09/16.

## 2017-05-12 NOTE — Telephone Encounter (Signed)
Lipid, liver, metabolic 7, CBC, hepatitis C antibody

## 2017-05-12 NOTE — Telephone Encounter (Signed)
Med sent to the lab.

## 2017-05-12 NOTE — Telephone Encounter (Signed)
Has an appt schedule soon with you would like to have labs drawn prior to that appt.last had Bmet,Hepatic profile,lipid on 03/26/2016.

## 2017-05-13 DIAGNOSIS — Z1159 Encounter for screening for other viral diseases: Secondary | ICD-10-CM | POA: Diagnosis not present

## 2017-05-13 DIAGNOSIS — Z79899 Other long term (current) drug therapy: Secondary | ICD-10-CM | POA: Diagnosis not present

## 2017-05-13 DIAGNOSIS — Z13220 Encounter for screening for lipoid disorders: Secondary | ICD-10-CM | POA: Diagnosis not present

## 2017-05-13 DIAGNOSIS — R5383 Other fatigue: Secondary | ICD-10-CM | POA: Diagnosis not present

## 2017-05-14 LAB — HEPATIC FUNCTION PANEL
ALT: 9 IU/L (ref 0–32)
AST: 18 IU/L (ref 0–40)
Albumin: 4.2 g/dL (ref 3.5–4.8)
Alkaline Phosphatase: 74 IU/L (ref 39–117)
Bilirubin Total: 0.3 mg/dL (ref 0.0–1.2)
Bilirubin, Direct: 0.1 mg/dL (ref 0.00–0.40)
Total Protein: 6.4 g/dL (ref 6.0–8.5)

## 2017-05-14 LAB — BASIC METABOLIC PANEL
BUN/Creatinine Ratio: 14 (ref 12–28)
BUN: 13 mg/dL (ref 8–27)
CO2: 32 mmol/L — ABNORMAL HIGH (ref 20–29)
Calcium: 9 mg/dL (ref 8.7–10.3)
Chloride: 100 mmol/L (ref 96–106)
Creatinine, Ser: 0.95 mg/dL (ref 0.57–1.00)
GFR calc Af Amer: 70 mL/min/{1.73_m2} (ref >59)
GFR calc non Af Amer: 61 mL/min/{1.73_m2} (ref >59)
Glucose: 97 mg/dL (ref 65–99)
Potassium: 3.8 mmol/L (ref 3.5–5.2)
Sodium: 143 mmol/L (ref 134–144)

## 2017-05-14 LAB — LIPID PANEL
Chol/HDL Ratio: 3.7 ratio (ref 0.0–4.4)
Cholesterol, Total: 194 mg/dL (ref 100–199)
HDL: 52 mg/dL (ref >39)
LDL Calculated: 86 mg/dL (ref 0–99)
Triglycerides: 280 mg/dL — ABNORMAL HIGH (ref 0–149)
VLDL Cholesterol Cal: 56 mg/dL — ABNORMAL HIGH (ref 5–40)

## 2017-05-14 LAB — CBC WITH DIFFERENTIAL/PLATELET
Basophils Absolute: 0 10*3/uL (ref 0.0–0.2)
Basos: 1 %
EOS (ABSOLUTE): 0.1 10*3/uL (ref 0.0–0.4)
Eos: 2 %
Hematocrit: 39.7 % (ref 34.0–46.6)
Hemoglobin: 13 g/dL (ref 11.1–15.9)
Immature Grans (Abs): 0 10*3/uL (ref 0.0–0.1)
Immature Granulocytes: 0 %
Lymphocytes Absolute: 1.2 10*3/uL (ref 0.7–3.1)
Lymphs: 29 %
MCH: 29.3 pg (ref 26.6–33.0)
MCHC: 32.7 g/dL (ref 31.5–35.7)
MCV: 89 fL (ref 79–97)
Monocytes Absolute: 0.3 10*3/uL (ref 0.1–0.9)
Monocytes: 8 %
Neutrophils Absolute: 2.6 10*3/uL (ref 1.4–7.0)
Neutrophils: 60 %
Platelets: 364 10*3/uL (ref 150–379)
RBC: 4.44 x10E6/uL (ref 3.77–5.28)
RDW: 13.6 % (ref 12.3–15.4)
WBC: 4.2 10*3/uL (ref 3.4–10.8)

## 2017-05-14 LAB — HEPATITIS C ANTIBODY: Hep C Virus Ab: <0.1 s/co ratio (ref 0.0–0.9)

## 2017-05-17 ENCOUNTER — Telehealth: Payer: Self-pay | Admitting: Family Medicine

## 2017-05-17 MED ORDER — PRAVASTATIN SODIUM 80 MG PO TABS: ORAL_TABLET | ORAL | 0 refills | Status: DC

## 2017-05-17 NOTE — Telephone Encounter (Signed)
Please let the patient know we checked her CBC and her hemoglobin is very good.  Typically if iron levels are terribly low hemoglobin would be low as well.  We did not check a ferritin level.  If the patient would desire to have this done then go ahead and order it

## 2017-05-17 NOTE — Telephone Encounter (Signed)
I spoke with the pt and she states dont worry on adding on the test.She is aware of all.

## 2017-05-17 NOTE — Telephone Encounter (Signed)
Patient had her labs done at [place] this past Saturday and was told at the lab that her blood would be good for 7 days if Dr. Nicki Reaper wanted to add on to have her iron checked.  Please advise.   Also, requesting Rx for pravastatin to [facility].

## 2017-05-29 ENCOUNTER — Ambulatory Visit (INDEPENDENT_AMBULATORY_CARE_PROVIDER_SITE_OTHER): Payer: Medicare Other | Admitting: Family Medicine

## 2017-05-29 ENCOUNTER — Encounter: Payer: Self-pay | Admitting: Family Medicine

## 2017-05-29 VITALS — BP 124/80 | HR 64 | Temp 97.9°F | Wt 138.0 lb

## 2017-05-29 DIAGNOSIS — E7849 Other hyperlipidemia: Secondary | ICD-10-CM | POA: Diagnosis not present

## 2017-05-29 DIAGNOSIS — F320 Major depressive disorder, single episode, mild: Secondary | ICD-10-CM

## 2017-05-29 DIAGNOSIS — I10 Essential (primary) hypertension: Secondary | ICD-10-CM

## 2017-05-29 MED ORDER — ALPRAZOLAM 1 MG PO TABS: ORAL_TABLET | ORAL | 1 refills | Status: DC

## 2017-05-29 MED ORDER — PRAVASTATIN SODIUM 80 MG PO TABS: ORAL_TABLET | ORAL | 1 refills | Status: DC

## 2017-05-29 MED ORDER — SERTRALINE HCL 50 MG PO TABS: 50.0000 mg | ORAL_TABLET | Freq: Every day | ORAL | 3 refills | Status: DC

## 2017-05-29 MED ORDER — TORSEMIDE 20 MG PO TABS: 20.0000 mg | ORAL_TABLET | Freq: Every morning | ORAL | 1 refills | Status: DC

## 2017-05-29 MED ORDER — SERTRALINE HCL 50 MG PO TABS: 50.0000 mg | ORAL_TABLET | Freq: Every day | ORAL | 5 refills | Status: DC

## 2017-05-29 MED ORDER — POTASSIUM CHLORIDE ER 10 MEQ PO TBCR: EXTENDED_RELEASE_TABLET | ORAL | 3 refills | Status: DC

## 2017-05-29 NOTE — Progress Notes (Signed)
   Subjective:    Patient ID: Terri Douglas, female    DOB: 05-02-1946, 71 y.o.   MRN: [id number]  Hyperlipidemia  This is a chronic problem. Pertinent negatives include no chest pain or shortness of breath. There are no compliance problems.  Risk factors for coronary artery disease include dyslipidemia and post-menopausal.    Back hurting between shoulder blades when doing activities. Patient relates mid back pain hurts when she stands or bends over for any length of time does not radiate down the legs denies any injury does not wake her up at night  Patient denies any chest pressure tightness soreness or shortness of breath  Patient under a significant amount of stress her spouse has dementia symptoms this causes significant strain and stress for the patient patient finds herself feeling negative sad depressed over the course of the past several weeks if not suicidal  Review of Systems  Constitutional: Negative for activity change, fatigue and fever.  HENT: Negative for congestion.   Respiratory: Negative for cough, chest tightness and shortness of breath.   Cardiovascular: Negative for chest pain and leg swelling.  Gastrointestinal: Negative for abdominal pain.  Skin: Negative for color change.  Neurological: Negative for headaches.  Psychiatric/Behavioral: Negative for behavioral problems.       Objective:   Physical Exam  Constitutional: She appears well-developed and well-nourished. No distress.  HENT:  Head: Normocephalic and atraumatic.  Eyes: Right eye exhibits no discharge. Left eye exhibits no discharge.  Neck: No tracheal deviation present.  Cardiovascular: Normal rate, regular rhythm and normal heart sounds.  No murmur heard. Pulmonary/Chest: Effort normal and breath sounds normal. No respiratory distress. She has no wheezes. She has no rales.  Musculoskeletal: She exhibits no edema.  Lymphadenopathy:    She has no cervical adenopathy.  Neurological: She is alert.  She exhibits normal muscle tone.  Skin: Skin is warm and dry. No erythema.  Psychiatric: Her behavior is normal.  Vitals reviewed.   25 minutes was spent with the patient. Greater than half the time was spent in discussion and answering questions and counseling regarding the issues that the patient came in for today.       Assessment & Plan:  Major depression-recommend Zoloft 50 mg one half daily for the first week then 1 daily thereafter cautioned nausea if ongoing troubles or worse to follow-up recheck in several weeks sooner if any problems patient not suicidal she was cautioned that if she starts becoming this way to immediately reach out for help here or ER  Hyperlipidemia tolerates medicine continue current medication watch diet  Acid reflux continue current medication  Gout no flareup continue medication  Comprehensive lab work in 6 months with office visit  Patient to follow-up in 4 weeks on depression

## 2017-06-26 ENCOUNTER — Ambulatory Visit (INDEPENDENT_AMBULATORY_CARE_PROVIDER_SITE_OTHER): Payer: Medicare Other | Admitting: Family Medicine

## 2017-06-26 ENCOUNTER — Encounter: Payer: Self-pay | Admitting: Family Medicine

## 2017-06-26 ENCOUNTER — Telehealth: Payer: Self-pay | Admitting: Family Medicine

## 2017-06-26 VITALS — BP 138/82 | HR 83 | Ht 62.0 in | Wt 136.0 lb

## 2017-06-26 DIAGNOSIS — R000 Tachycardia, unspecified: Secondary | ICD-10-CM | POA: Diagnosis not present

## 2017-06-26 DIAGNOSIS — I4891 Unspecified atrial fibrillation: Secondary | ICD-10-CM | POA: Diagnosis not present

## 2017-06-26 MED ORDER — METOPROLOL SUCCINATE ER 25 MG PO TB24: 25.0000 mg | ORAL_TABLET | Freq: Every day | ORAL | 5 refills | Status: DC

## 2017-06-26 MED ORDER — APIXABAN 5 MG PO TABS: 5.0000 mg | ORAL_TABLET | Freq: Two times a day (BID) | ORAL | 5 refills | Status: DC

## 2017-06-26 NOTE — Telephone Encounter (Signed)
I did discuss case with cardiology.  Start Eliquis 5 mg twice daily, stop aspirin, start metoprolol ER 25 mg daily, these prescriptions were sent into her pharmacy.  We will set up a echo plus also urgent referral cardiology.

## 2017-06-26 NOTE — Progress Notes (Signed)
   Subjective:    Patient ID: Terri Douglas, female    DOB: Nov 10, 1945, 72 y.o.   MRN: [id number]  HPI Patient is here today with complaints heart racing.She states she took sucralfate one this am with ginger ale,she then developed the racing heart. She has not had any caffeine nor smoked. She state she just does not feel right. Patient relates she felt like her heart was racing earlier today but now it seems to have calm down she denies irregularity to it has not had this problem before but she states 15 years ago she did have some episode of her heart running fast that went away she overall is in good health does not smoke or drink denies any chest tightness pressure pain or sweats  Review of Systems  Constitutional: Negative for activity change, fatigue and fever.  HENT: Negative for congestion.   Respiratory: Negative for cough, chest tightness and shortness of breath.   Cardiovascular: Positive for palpitations. Negative for chest pain and leg swelling.  Gastrointestinal: Negative for abdominal pain.  Skin: Negative for color change.  Neurological: Negative for headaches.  Psychiatric/Behavioral: Negative for behavioral problems.       Objective:   Physical Exam  Constitutional: She appears well-developed and well-nourished. No distress.  HENT:  Head: Normocephalic and atraumatic.  Eyes: Right eye exhibits no discharge. Left eye exhibits no discharge.  Neck: No tracheal deviation present.  Cardiovascular: Normal rate.  No murmur heard. Tachycardia noted with an occasional irregularity  Pulmonary/Chest: Effort normal and breath sounds normal. No respiratory distress. She has no wheezes. She has no rales.  Musculoskeletal: She exhibits no edema.  Lymphadenopathy:    She has no cervical adenopathy.  Neurological: She is alert. She exhibits normal muscle tone.  Skin: Skin is warm and dry. No erythema.  Psychiatric: Her behavior is normal.  Vitals reviewed.  EKG was  completed  25 minutes was spent with the patient. Greater than half the time was spent in discussion and answering questions and counseling regarding the issues that the patient came in for today.      Assessment & Plan:  New onset atrial fibrillation Seems like patient did have rapid ventricular response earlier today but none now I did speak with PA with cardiology office reviewed EKG with them Because of patient's risk of stroke with a chads score of 2/3 it is advisable for patient to be on blood thinner The risk and benefits explained to the patient Patient desires to be on medication to avoid risk of stroke Eliquis 5 mg twice daily If any bleeding issues immediately present to ER Risk of bleeding was discussed Importance of safety discussed Metoprolol ER 25 mg 1 daily Echo Cardiology referral

## 2017-06-27 ENCOUNTER — Encounter: Payer: Self-pay | Admitting: Family Medicine

## 2017-06-27 LAB — BASIC METABOLIC PANEL
BUN/Creatinine Ratio: 14 (ref 12–28)
BUN: 12 mg/dL (ref 8–27)
CO2: 28 mmol/L (ref 20–29)
Calcium: 9.4 mg/dL (ref 8.7–10.3)
Chloride: 98 mmol/L (ref 96–106)
Creatinine, Ser: 0.88 mg/dL (ref 0.57–1.00)
GFR calc Af Amer: 77 mL/min/{1.73_m2} (ref >59)
GFR calc non Af Amer: 67 mL/min/{1.73_m2} (ref >59)
Glucose: 107 mg/dL — ABNORMAL HIGH (ref 65–99)
Potassium: 3.8 mmol/L (ref 3.5–5.2)
Sodium: 142 mmol/L (ref 134–144)

## 2017-06-27 LAB — T4, FREE: Free T4: 1.34 ng/dL (ref 0.82–1.77)

## 2017-06-27 LAB — TSH: TSH: 3.44 u[IU]/mL (ref 0.450–4.500)

## 2017-06-28 ENCOUNTER — Encounter: Payer: Self-pay | Admitting: Cardiology

## 2017-06-28 NOTE — Progress Notes (Signed)
Cardiology Office Note  Date: 06/30/2017   ID: Terri Douglas, DOB 1945/09/18, MRN [id number]  PCP: Kathyrn Drown, MD  Consulting Cardiologist: Rozann Lesches, MD   Chief Complaint  Patient presents with  . Atrial Flutter    History of Present Illness: Terri Douglas is a 72 y.o. female referred for cardiology consultation by Dr. Wolfgang Phoenix for evaluation of recently documented atrial flutter. She tells me that she awoke Monday morning and felt like her heart rate was more elevated than normal, a vague sense of palpitations. She checked her heart rate on a health app on her phone and found it to be in the 100-105 range prompting a visit to see Dr. Wolfgang Phoenix. ECG obtained that day shows typical atrial flutter, personally reviewed today.  CHADSVASC score is 5 based on available information. She was just recently started on Eliquis for stroke prophylaxis by Dr. Wolfgang Phoenix. She was also placed on Toprol-XL. She states that she feels better, her heart rate has come down to normal.  She underwent previous cardiac assessment in 2005 in the setting of chest pain and minor elevation in cardiac enzymes. Cardiac catheterization at that time demonstrated only mild atherosclerosis. She had a follow-up echocardiogram done earlier this morning that demonstrated an LVEF of 55-60% without major valvular abnormalities.  I personally reviewed her follow-up ECG today which shows normal sinus rhythm. I went over this with her. She does not report any prior history of palpitations. She has no history of atrial fibrillation.  Past Medical History:  Diagnosis Date  . Arthritis   . Gall stones   . GERD (gastroesophageal reflux disease)   . History of cardiac catheterization 2005   Minor elevation in cardiac enzymes however no significant obstructive CAD  . Hyperlipidemia   . Hypertension   . Insomnia   . Prediabetes     Past Surgical History:  Procedure Laterality Date  . ABDOMINAL HYSTERECTOMY    . BACK  SURGERY  1992  . CARDIAC CATHETERIZATION     BACK IN 2004  SHE THINKS IT CAME BACK 'NORMAL'  . CHOLECYSTECTOMY N/A 06/24/2015   Procedure: LAPAROSCOPIC CHOLECYSTECTOMY;  Surgeon: Mickeal Skinner, MD;  Location: [facility];  Service: General;  Laterality: N/A;  . COLONOSCOPY N/A 08/30/2012   Procedure: COLONOSCOPY;  Surgeon: Rogene Houston, MD;  [facility];  Service: Endoscopy;  Laterality: N/A;  830  . DILATION AND CURETTAGE OF UTERUS    . ESOPHAGOGASTRODUODENOSCOPY  04/15/2011   Procedure: ESOPHAGOGASTRODUODENOSCOPY (EGD);  Surgeon: Rogene Houston, MD;  [facility];  Service: Endoscopy;  Laterality: N/A;  11:30  . ESOPHAGOGASTRODUODENOSCOPY N/A 05/27/2016   Procedure: ESOPHAGOGASTRODUODENOSCOPY (EGD);  Surgeon: Rogene Houston, MD;  [facility];  Service: Endoscopy;  Laterality: N/A;  11:15  . EYE SURGERY     cataract removal  . NECK SURGERY  x3  . VAGINAL HYSTERECTOMY      Current Outpatient Medications  Medication Sig Dispense Refill  . allopurinol (ZYLOPRIM) 100 MG tablet Take 100 mg by mouth daily.    Marland Kitchen ALPRAZolam (XANAX) 1 MG tablet TAKE ONE TABLET DAILY AT BEDTIME AS NEEDED 90 tablet 1  . apixaban (ELIQUIS) 5 MG TABS tablet Take 1 tablet (5 mg total) by mouth 2 (two) times daily. 60 tablet 5  . Docusate Sodium (PHILLIPS STOOL SOFTENER PO) Take by mouth. Patient states that she takes 2 by mouth at night.    . estradiol (ESTRACE) 1 MG tablet Take 1  mg by mouth daily.    . finasteride (PROSCAR) 5 MG tablet Take 2.5 mg by mouth daily.   4  . metoprolol succinate (TOPROL-XL) 25 MG 24 hr tablet Take 1 tablet (25 mg total) by mouth daily. 30 tablet 5  . minoxidil (ROGAINE) 2 % external solution Apply 1 application topically daily.     Marland Kitchen OVER THE COUNTER MEDICATION 25 mg. Sennosides Stimulant Laxative - Patient takes 1 by mouth every night.    . pantoprazole (PROTONIX) 40 MG tablet Take 1 tablet (40 mg total) by mouth 2 (two) times daily before a meal.  60 tablet 3  . potassium chloride (K-DUR) 10 MEQ tablet TAKE ONE (1) TABLET BY MOUTH EVERY DAY 90 tablet 3  . pravastatin (PRAVACHOL) 80 MG tablet TAKE ONE (1) TABLET EACH DAY 90 tablet 1  . sertraline (ZOLOFT) 50 MG tablet Take 1 tablet (50 mg total) by mouth daily. 30 tablet 5  . sucralfate (CARAFATE) 1 g tablet Take 1 tablet (1 g total) by mouth 2 (two) times daily as needed. 60 tablet 2  . torsemide (DEMADEX) 20 MG tablet Take 1 tablet (20 mg total) by mouth every morning. 90 tablet 1   No current facility-administered medications for this visit.    Allergies:  Phenobarbital   Social History: The patient  reports that  has never smoked. she has never used smokeless tobacco. She reports that she does not drink alcohol or use drugs.   Family History: The patient's family history includes Bone cancer in her mother; Prostate cancer in her father.   ROS:  Please see the history of present illness. Otherwise, complete review of systems is positive for none.  All other systems are reviewed and negative.   Physical Exam: VS:  BP 136/72   Pulse 60   Ht 5\' 2"  (1.575 m)   Wt 138 lb 6.4 oz (62.8 kg)   SpO2 98%   BMI 25.31 kg/m , BMI Body mass index is 25.31 kg/m.  Wt Readings from Last 3 Encounters:  06/30/17 138 lb 6.4 oz (62.8 kg)  06/26/17 136 lb 0.6 oz (61.7 kg)  05/29/17 138 lb (62.6 kg)    General: Patient appears comfortable at rest. HEENT: Conjunctiva and lids normal, oropharynx clear. Neck: Supple, no elevated JVP or carotid bruits, no thyromegaly. Lungs: Clear to auscultation, nonlabored breathing at rest. Cardiac: Regular rate and rhythm, no S3 or significant systolic murmur, no pericardial rub. Abdomen: Soft, nontender, bowel sounds present. Extremities: No pitting edema, distal pulses 2+. Skin: Warm and dry. Musculoskeletal: No kyphosis. Neuropsychiatric: Alert and oriented x3, affect grossly appropriate.  ECG: I personally reviewed the tracing from 06/26/2017 which  showed atrial flutter with variable block.  Recent Labwork: 05/13/2017: ALT 9; AST 18; Hemoglobin 13.0; Platelets 364 06/26/2017: BUN 12; Creatinine, Ser 0.88; Potassium 3.8; Sodium 142; TSH 3.440     Component Value Date/Time   CHOL 194 05/13/2017 0840   TRIG 280 (H) 05/13/2017 0840   HDL 52 05/13/2017 0840   CHOLHDL 3.7 05/13/2017 0840   CHOLHDL 3.8 06/18/2014 0712   VLDL 47 (H) 06/18/2014 0712   LDLCALC 86 05/13/2017 0840    Other Studies Reviewed Today:  Cardiac catheterization 04/28/2004:  LEFT VENTRICULOGRAM:  Wall motion is normal with hyperdynamic left  ventricle.  Ejection fraction is estimated at greater than or equal to 65%.  There was mild mitral regurgitation present which appeared to be secondary  to ventricular ectopy.   CORONARY ARTERIOGRAPHY:  Left main is normal.  Left anterior descending artery has minor luminal irregularities in the  proximal vessel.  The mid LAD has a 20% stenosis.  The LAD gives rise to a  single normal size diagonal branch.   Left circumflex gives rise to a small ramus intermedius, large first obtuse  marginal branch, and a small second obtuse marginal branch.  There were  minor luminal irregularities in the first obtuse marginal branch.   Right coronary artery is a dominant vessel.  There is a 20% stenosis in the  proximal right coronary artery.  The distal right coronary artery gives rise  to a normal size posterior descending artery, small first posterior lateral  branch, and a large second posterior lateral branch.   IMPRESSION:  1.  Normal left ventricular systolic function.  2.  No significant coronary artery disease identified.  Echocardiogram 06/30/2017: Study Conclusions  - Left ventricle: The cavity size was normal. Wall thickness was   normal. Systolic function was normal. The estimated ejection   fraction was in the range of 55% to 60%. Wall motion was normal;   there were no regional wall motion abnormalities.  Left   ventricular diastolic function parameters were normal. - Aortic valve: Mildly calcified annulus. Trileaflet; normal   thickness leaflets. Valve area (VTI): 2.04 cm^2. Valve area   (Vmax): 2.26 cm^2. Valve area (Vmean): 2.04 cm^2. - Technically adequate study.  Assessment and Plan:  1. Recently documented typical atrial flutter. CHADSVASC score is 5, she is now on Eliquis as well as Toprol-XL. She has spontaneously converted to sinus rhythm as evident by follow-up ECG today and feels better. She has no previous history of cardiac arrhythmias. We have discussed treatment options, and at this point plan is to have her referred for EP consultation to discuss merits of considering an atrial flutter ablation. If this were successful it may afford her the opportunity of stopping anticoagulation and Toprol-XL. Otherwise, we will plan to continue with present regimen and observation.  2. Previously documented mild coronary atherosclerosis at cardiac catheterization in 2005. She does not report any angina symptoms. Medical therapy includes statin.  3. Mixed hyperlipidemia, on Pravachol. She follows with Dr. Wolfgang Phoenix.  4. Essential hypertension. Keep follow-up with Dr. Wolfgang Phoenix.  Current medicines were reviewed with the patient today.   Orders Placed This Encounter  Procedures  . Ambulatory referral to Cardiac Electrophysiology    Disposition: Follow-up in 3 months.  Signed, Satira Sark, MD, [facility] 06/30/2017 1:50 PM    [facility], [place], [place] [address] Phone: [phone]; Fax: [phone]

## 2017-06-29 ENCOUNTER — Ambulatory Visit: Payer: Medicare Other | Admitting: Family Medicine

## 2017-06-30 ENCOUNTER — Ambulatory Visit (HOSPITAL_COMMUNITY)
Admission: RE | Admit: 2017-06-30 | Discharge: 2017-06-30 | Disposition: A | Discharge status: Home / Self Care | Payer: Medicare Other | Source: Ambulatory Visit | Attending: Family Medicine | Admitting: Family Medicine

## 2017-06-30 ENCOUNTER — Ambulatory Visit (INDEPENDENT_AMBULATORY_CARE_PROVIDER_SITE_OTHER): Payer: Medicare Other | Admitting: Cardiology

## 2017-06-30 ENCOUNTER — Encounter: Payer: Self-pay | Admitting: Cardiology

## 2017-06-30 VITALS — BP 136/72 | HR 60 | Ht 62.0 in | Wt 138.4 lb

## 2017-06-30 DIAGNOSIS — R000 Tachycardia, unspecified: Secondary | ICD-10-CM | POA: Insufficient documentation

## 2017-06-30 DIAGNOSIS — I10 Essential (primary) hypertension: Secondary | ICD-10-CM

## 2017-06-30 DIAGNOSIS — I4891 Unspecified atrial fibrillation: Secondary | ICD-10-CM | POA: Insufficient documentation

## 2017-06-30 DIAGNOSIS — E782 Mixed hyperlipidemia: Secondary | ICD-10-CM

## 2017-06-30 DIAGNOSIS — I483 Typical atrial flutter: Secondary | ICD-10-CM | POA: Diagnosis not present

## 2017-06-30 DIAGNOSIS — E7800 Pure hypercholesterolemia, unspecified: Secondary | ICD-10-CM | POA: Insufficient documentation

## 2017-06-30 DIAGNOSIS — I2510 Atherosclerotic heart disease of native coronary artery without angina pectoris: Secondary | ICD-10-CM

## 2017-06-30 DIAGNOSIS — I358 Other nonrheumatic aortic valve disorders: Secondary | ICD-10-CM | POA: Diagnosis not present

## 2017-06-30 LAB — ECHOCARDIOGRAM COMPLETE
E decel time: 183 msec
E/e' ratio: 7.76
FS: 33 % (ref 28–44)
IVS/LV PW RATIO, ED: 1.11
LA ID, A-P, ES: 32 mm
LA diam end sys: 32 mm
LA diam index: 1.93 cm/m2
LA vol A4C: 37.5 ml
LA vol index: 23.3 mL/m2
LA vol: 38.6 mL
LV E/e' medial: 7.76
LV E/e'average: 7.76
LV PW d: 7.79 mm — AB (ref 0.6–1.1)
LV dias vol index: 22 mL/m2
LV dias vol: 36 mL — AB (ref 46–106)
LV e' LATERAL: 10.7 cm/s
LV sys vol index: 8 mL/m2
LV sys vol: 14 mL
LVOT SV: 54 mL
LVOT VTI: 21.4 cm
LVOT area: 2.54 cm2
LVOT diameter: 18 mm
LVOT peak grad rest: 4 mmHg
LVOT peak vel: 96.6 cm/s
Lateral S' vel: 11 cm/s
MV Dec: 183
MV Peak grad: 3 mmHg
MV pk A vel: 76.1 m/s
MV pk E vel: 83 m/s
RV sys press: 17 mmHg
Reg peak vel: 185 cm/s
Simpson's disk: 62
Stroke v: 22 ml
TAPSE: 17.9 mm
TDI e' lateral: 10.7
TDI e' medial: 9.57
TR max vel: 185 cm/s

## 2017-06-30 NOTE — Patient Instructions (Signed)
Medication Instructions:  Your physician recommends that you continue on your current medications as directed. Please refer to the Current Medication list given to you today.  Labwork: NONE  Testing/Procedures: NONE  Follow-Up: Your physician recommends that you schedule a follow-up appointment in: 3 MONTHS WITH DR. MCDOWELL  Any Other Special Instructions Will Be Listed Below (If Applicable). You have been referred to DR. ALLRED ELECTROPHYSIOLOGIST   If you need a refill on your cardiac medications before your next appointment, please call your pharmacy.

## 2017-06-30 NOTE — Addendum Note (Signed)
Addended by: Acquanetta Chain on: 06/30/2017 01:55 PM   Modules accepted: Orders

## 2017-06-30 NOTE — Progress Notes (Signed)
*  PRELIMINARY RESULTS* Echocardiogram 2D Echocardiogram has been performed.  Terri Douglas 06/30/2017, 10:20 AM

## 2017-07-05 ENCOUNTER — Other Ambulatory Visit (INDEPENDENT_AMBULATORY_CARE_PROVIDER_SITE_OTHER): Payer: Self-pay | Admitting: Internal Medicine

## 2017-07-07 DEATH — deceased

## 2017-07-12 ENCOUNTER — Telehealth: Payer: Self-pay

## 2017-07-12 ENCOUNTER — Encounter: Payer: Self-pay | Admitting: Internal Medicine

## 2017-07-12 ENCOUNTER — Ambulatory Visit (INDEPENDENT_AMBULATORY_CARE_PROVIDER_SITE_OTHER): Payer: Medicare Other | Admitting: Internal Medicine

## 2017-07-12 VITALS — BP 124/80 | HR 61 | Ht 62.0 in | Wt 136.0 lb

## 2017-07-12 DIAGNOSIS — I483 Typical atrial flutter: Secondary | ICD-10-CM

## 2017-07-12 DIAGNOSIS — I4892 Unspecified atrial flutter: Secondary | ICD-10-CM

## 2017-07-12 NOTE — Progress Notes (Signed)
Electrophysiology Office Note   Date:  07/12/2017   ID:  Terri Douglas, DOB April 27, 1946, MRN [id number]  PCP:  Kathyrn Drown, MD  Cardiologist:  Dr Domenic Polite Primary Electrophysiologist: Thompson Grayer, MD    Chief Complaint  Patient presents with  . Atrial Flutter     History of Present Illness: Terri Douglas is a 72 y.o. female who presents today for electrophysiology evaluation.   She is referred by Dr Domenic Polite for EP consultation regarding atrial flutter.  She reports being in good health until 06/27/17 when she developed abrupt onset of tachypalpitations.  She noted her heart rate to be elevated.  She presented to Dr Lance Sell office and was found to have typical appearing atrial flutter.  She was placed on toprol and eliquis.  She followed up with Dr Domenic Polite and had converted to sinus rhythm.  She has done well since, without further symptoms.   Today, she denies symptoms of palpitations, chest pain, shortness of breath, orthopnea, PND, lower extremity edema, claudication, dizziness, presyncope, syncope, bleeding, or neurologic sequela. The patient is tolerating medications without difficulties and is otherwise without complaint today.    Past Medical History:  Diagnosis Date  . Arthritis   . Gall stones   . GERD (gastroesophageal reflux disease)   . History of cardiac catheterization 2005   Minor elevation in cardiac enzymes however no significant obstructive CAD  . Hyperlipidemia   . Hypertension   . Insomnia   . Prediabetes    Past Surgical History:  Procedure Laterality Date  . ABDOMINAL HYSTERECTOMY    . BACK SURGERY  1992  . CARDIAC CATHETERIZATION     BACK IN 2004  SHE THINKS IT CAME BACK 'NORMAL'  . CHOLECYSTECTOMY N/A 06/24/2015   Procedure: LAPAROSCOPIC CHOLECYSTECTOMY;  Surgeon: Mickeal Skinner, MD;  Location: [facility];  Service: General;  Laterality: N/A;  . COLONOSCOPY N/A 08/30/2012   Procedure: COLONOSCOPY;  Surgeon: Rogene Houston, MD;  [facility];  Service: Endoscopy;  Laterality: N/A;  830  . DILATION AND CURETTAGE OF UTERUS    . ESOPHAGOGASTRODUODENOSCOPY  04/15/2011   Procedure: ESOPHAGOGASTRODUODENOSCOPY (EGD);  Surgeon: Rogene Houston, MD;  [facility];  Service: Endoscopy;  Laterality: N/A;  11:30  . ESOPHAGOGASTRODUODENOSCOPY N/A 05/27/2016   Procedure: ESOPHAGOGASTRODUODENOSCOPY (EGD);  Surgeon: Rogene Houston, MD;  [facility];  Service: Endoscopy;  Laterality: N/A;  11:15  . EYE SURGERY     cataract removal  . NECK SURGERY  x3  . VAGINAL HYSTERECTOMY       Current Outpatient Medications  Medication Sig Dispense Refill  . allopurinol (ZYLOPRIM) 100 MG tablet Take 100 mg by mouth daily.    Marland Kitchen ALPRAZolam (XANAX) 1 MG tablet TAKE ONE TABLET DAILY AT BEDTIME AS NEEDED 90 tablet 1  . apixaban (ELIQUIS) 5 MG TABS tablet Take 1 tablet (5 mg total) by mouth 2 (two) times daily. 60 tablet 5  . Docusate Sodium (PHILLIPS STOOL SOFTENER PO) Take by mouth. Patient states that she takes 2 by mouth at night.    . estradiol (ESTRACE) 1 MG tablet Take 1 mg by mouth daily.    . finasteride (PROSCAR) 5 MG tablet Take 2.5 mg by mouth daily.   4  . metoprolol succinate (TOPROL-XL) 25 MG 24 hr tablet Take 1 tablet (25 mg total) by mouth daily. 30 tablet 5  . minoxidil (ROGAINE) 2 % external solution Apply 1 application topically daily.     Marland Kitchen  OVER THE COUNTER MEDICATION 25 mg. Sennosides Stimulant Laxative - Patient takes 1 by mouth every night.    . pantoprazole (PROTONIX) 40 MG tablet Take 1 tablet (40 mg total) by mouth 2 (two) times daily before a meal. 60 tablet 3  . potassium chloride (K-DUR) 10 MEQ tablet TAKE ONE (1) TABLET BY MOUTH EVERY DAY 90 tablet 3  . pravastatin (PRAVACHOL) 80 MG tablet TAKE ONE (1) TABLET EACH DAY 90 tablet 1  . sertraline (ZOLOFT) 50 MG tablet Take 1 tablet (50 mg total) by mouth daily. 30 tablet 5  . sucralfate (CARAFATE) 1 g tablet TAKE ONE TABLET BY MOUTH TWICE DAILY AS  NEEDED 60 tablet 2  . torsemide (DEMADEX) 20 MG tablet Take 1 tablet (20 mg total) by mouth every morning. 90 tablet 1   No current facility-administered medications for this visit.     Allergies:   Phenobarbital   Social History:  The patient  reports that  has never smoked. she has never used smokeless tobacco. She reports that she does not drink alcohol or use drugs.   Family History:  The patient's  family history includes Bone cancer in her mother; Prostate cancer in her father.    ROS:  Please see the history of present illness.   All other systems are personally reviewed and negative.    PHYSICAL EXAM: VS:  BP 124/80   Pulse 61   Ht 5\' 2"  (1.575 m)   Wt 136 lb (61.7 kg)   BMI 24.87 kg/m  , BMI Body mass index is 24.87 kg/m. GEN: Well nourished, well developed, in no acute distress  HEENT: normal  Neck: no JVD, carotid bruits, or masses Cardiac: RRR; no murmurs, rubs, or gallops,no edema  Respiratory:  clear to auscultation bilaterally, normal work of breathing GI: soft, nontender, nondistended, + BS MS: no deformity or atrophy  Skin: warm and dry  Neuro:  Strength and sensation are intact Psych: euthymic mood, full affect  EKG:  EKG is ordered today. The ekg ordered today is personally reviewed and shows sinus rhythm 61 bpm, nonspecific St/T changes   Recent Labs: 05/13/2017: ALT 9; Hemoglobin 13.0; Platelets 364 06/26/2017: BUN 12; Creatinine, Ser 0.88; Potassium 3.8; Sodium 142; TSH 3.440  personally reviewed   Lipid Panel     Component Value Date/Time   CHOL 194 05/13/2017 0840   TRIG 280 (H) 05/13/2017 0840   HDL 52 05/13/2017 0840   CHOLHDL 3.7 05/13/2017 0840   CHOLHDL 3.8 06/18/2014 0712   VLDL 47 (H) 06/18/2014 0712   LDLCALC 86 05/13/2017 0840   personally reviewed   Wt Readings from Last 3 Encounters:  07/12/17 136 lb (61.7 kg)  06/30/17 138 lb 6.4 oz (62.8 kg)  06/26/17 136 lb 0.6 oz (61.7 kg)      Other studies personally  reviewed: Additional studies/ records that were reviewed today include: ecg 06/27/17 reveals typical atrial flutter, echo 06/30/17 also reviewed  Review of the above records today demonstrates: preserved EF, no significant valvular disease    ASSESSMENT AND PLAN:  1.  Typical atrial flutter Symptomatic typical atrial flutter chads2vasc score is 3.  Therapeutic strategies for atrial flutter ncluding medicine and ablation were discussed in detail with the patient today.  She worries about long term risks of anticoagulation.  Risk, benefits, and alternatives to EP study and radiofrequency ablation were also discussed in detail today. These risks include but are not limited to stroke, bleeding, vascular damage, tamponade, perforation, damage to the heart  and other structures, AV block requiring pacemaker, worsening renal function, and death. The patient understands these risk and wishes to proceed.  We will therefore proceed with catheter ablation at the next available time.  Carto and Anesthesia are requested for the procedure. Would plan to stop anticoagulation post ablation.   Current medicines are reviewed at length with the patient today.   The patient does not have concerns regarding her medicines.  The following changes were made today:  none   Signed, Thompson Grayer, MD  07/12/2017 10:54 AM     [facility] [address] [place] [address] [phone] (office) [phone] (fax)

## 2017-07-12 NOTE — H&P (View-Only) (Signed)
Electrophysiology Office Note   Date:  07/12/2017   ID:  OSCAR FORMAN, DOB 1946-03-19, MRN [id number]  PCP:  Kathyrn Drown, MD  Cardiologist:  Dr Domenic Polite Primary Electrophysiologist: Thompson Grayer, MD    Chief Complaint  Patient presents with  . Atrial Flutter     History of Present Illness: Terri Douglas is a 72 y.o. female who presents today for electrophysiology evaluation.   She is referred by Dr Domenic Polite for EP consultation regarding atrial flutter.  She reports being in good health until 06/27/17 when she developed abrupt onset of tachypalpitations.  She noted her heart rate to be elevated.  She presented to Dr Lance Sell office and was found to have typical appearing atrial flutter.  She was placed on toprol and eliquis.  She followed up with Dr Domenic Polite and had converted to sinus rhythm.  She has done well since, without further symptoms.   Today, she denies symptoms of palpitations, chest pain, shortness of breath, orthopnea, PND, lower extremity edema, claudication, dizziness, presyncope, syncope, bleeding, or neurologic sequela. The patient is tolerating medications without difficulties and is otherwise without complaint today.    Past Medical History:  Diagnosis Date  . Arthritis   . Gall stones   . GERD (gastroesophageal reflux disease)   . History of cardiac catheterization 2005   Minor elevation in cardiac enzymes however no significant obstructive CAD  . Hyperlipidemia   . Hypertension   . Insomnia   . Prediabetes    Past Surgical History:  Procedure Laterality Date  . ABDOMINAL HYSTERECTOMY    . BACK SURGERY  1992  . CARDIAC CATHETERIZATION     BACK IN 2004  SHE THINKS IT CAME BACK 'NORMAL'  . CHOLECYSTECTOMY N/A 06/24/2015   Procedure: LAPAROSCOPIC CHOLECYSTECTOMY;  Surgeon: Mickeal Skinner, MD;  Location: [facility];  Service: General;  Laterality: N/A;  . COLONOSCOPY N/A 08/30/2012   Procedure: COLONOSCOPY;  Surgeon: Rogene Houston, MD;  [facility];  Service: Endoscopy;  Laterality: N/A;  830  . DILATION AND CURETTAGE OF UTERUS    . ESOPHAGOGASTRODUODENOSCOPY  04/15/2011   Procedure: ESOPHAGOGASTRODUODENOSCOPY (EGD);  Surgeon: Rogene Houston, MD;  [facility];  Service: Endoscopy;  Laterality: N/A;  11:30  . ESOPHAGOGASTRODUODENOSCOPY N/A 05/27/2016   Procedure: ESOPHAGOGASTRODUODENOSCOPY (EGD);  Surgeon: Rogene Houston, MD;  [facility];  Service: Endoscopy;  Laterality: N/A;  11:15  . EYE SURGERY     cataract removal  . NECK SURGERY  x3  . VAGINAL HYSTERECTOMY       Current Outpatient Medications  Medication Sig Dispense Refill  . allopurinol (ZYLOPRIM) 100 MG tablet Take 100 mg by mouth daily.    Marland Kitchen ALPRAZolam (XANAX) 1 MG tablet TAKE ONE TABLET DAILY AT BEDTIME AS NEEDED 90 tablet 1  . apixaban (ELIQUIS) 5 MG TABS tablet Take 1 tablet (5 mg total) by mouth 2 (two) times daily. 60 tablet 5  . Docusate Sodium (PHILLIPS STOOL SOFTENER PO) Take by mouth. Patient states that she takes 2 by mouth at night.    . estradiol (ESTRACE) 1 MG tablet Take 1 mg by mouth daily.    . finasteride (PROSCAR) 5 MG tablet Take 2.5 mg by mouth daily.   4  . metoprolol succinate (TOPROL-XL) 25 MG 24 hr tablet Take 1 tablet (25 mg total) by mouth daily. 30 tablet 5  . minoxidil (ROGAINE) 2 % external solution Apply 1 application topically daily.     Marland Kitchen  OVER THE COUNTER MEDICATION 25 mg. Sennosides Stimulant Laxative - Patient takes 1 by mouth every night.    . pantoprazole (PROTONIX) 40 MG tablet Take 1 tablet (40 mg total) by mouth 2 (two) times daily before a meal. 60 tablet 3  . potassium chloride (K-DUR) 10 MEQ tablet TAKE ONE (1) TABLET BY MOUTH EVERY DAY 90 tablet 3  . pravastatin (PRAVACHOL) 80 MG tablet TAKE ONE (1) TABLET EACH DAY 90 tablet 1  . sertraline (ZOLOFT) 50 MG tablet Take 1 tablet (50 mg total) by mouth daily. 30 tablet 5  . sucralfate (CARAFATE) 1 g tablet TAKE ONE TABLET BY MOUTH TWICE DAILY AS  NEEDED 60 tablet 2  . torsemide (DEMADEX) 20 MG tablet Take 1 tablet (20 mg total) by mouth every morning. 90 tablet 1   No current facility-administered medications for this visit.     Allergies:   Phenobarbital   Social History:  The patient  reports that  has never smoked. she has never used smokeless tobacco. She reports that she does not drink alcohol or use drugs.   Family History:  The patient's  family history includes Bone cancer in her mother; Prostate cancer in her father.    ROS:  Please see the history of present illness.   All other systems are personally reviewed and negative.    PHYSICAL EXAM: VS:  BP 124/80   Pulse 61   Ht 5\' 2"  (1.575 m)   Wt 136 lb (61.7 kg)   BMI 24.87 kg/m  , BMI Body mass index is 24.87 kg/m. GEN: Well nourished, well developed, in no acute distress  HEENT: normal  Neck: no JVD, carotid bruits, or masses Cardiac: RRR; no murmurs, rubs, or gallops,no edema  Respiratory:  clear to auscultation bilaterally, normal work of breathing GI: soft, nontender, nondistended, + BS MS: no deformity or atrophy  Skin: warm and dry  Neuro:  Strength and sensation are intact Psych: euthymic mood, full affect  EKG:  EKG is ordered today. The ekg ordered today is personally reviewed and shows sinus rhythm 61 bpm, nonspecific St/T changes   Recent Labs: 05/13/2017: ALT 9; Hemoglobin 13.0; Platelets 364 06/26/2017: BUN 12; Creatinine, Ser 0.88; Potassium 3.8; Sodium 142; TSH 3.440  personally reviewed   Lipid Panel     Component Value Date/Time   CHOL 194 05/13/2017 0840   TRIG 280 (H) 05/13/2017 0840   HDL 52 05/13/2017 0840   CHOLHDL 3.7 05/13/2017 0840   CHOLHDL 3.8 06/18/2014 0712   VLDL 47 (H) 06/18/2014 0712   LDLCALC 86 05/13/2017 0840   personally reviewed   Wt Readings from Last 3 Encounters:  07/12/17 136 lb (61.7 kg)  06/30/17 138 lb 6.4 oz (62.8 kg)  06/26/17 136 lb 0.6 oz (61.7 kg)      Other studies personally  reviewed: Additional studies/ records that were reviewed today include: ecg 06/27/17 reveals typical atrial flutter, echo 06/30/17 also reviewed  Review of the above records today demonstrates: preserved EF, no significant valvular disease    ASSESSMENT AND PLAN:  1.  Typical atrial flutter Symptomatic typical atrial flutter chads2vasc score is 3.  Therapeutic strategies for atrial flutter ncluding medicine and ablation were discussed in detail with the patient today.  She worries about long term risks of anticoagulation.  Risk, benefits, and alternatives to EP study and radiofrequency ablation were also discussed in detail today. These risks include but are not limited to stroke, bleeding, vascular damage, tamponade, perforation, damage to the heart  and other structures, AV block requiring pacemaker, worsening renal function, and death. The patient understands these risk and wishes to proceed.  We will therefore proceed with catheter ablation at the next available time.  Carto and Anesthesia are requested for the procedure. Would plan to stop anticoagulation post ablation.   Current medicines are reviewed at length with the patient today.   The patient does not have concerns regarding her medicines.  The following changes were made today:  none   Signed, Thompson Grayer, MD  07/12/2017 10:54 AM     [facility] [address] [place] [address] [phone] (office) [phone] (fax)

## 2017-07-12 NOTE — Patient Instructions (Addendum)
Medication Instructions:  Your physician recommends that you continue on your current medications as directed. Please refer to the Current Medication list given to you today.   Labwork:  Your physician recommends that you return for lab work the week of 07/24/17 for BMET, CBC   Testing/Procedures: Your physician has recommended that you have an ablation on 08/01/17. Catheter ablation is a medical procedure used to treat some cardiac arrhythmias (irregular heartbeats). During catheter ablation, a long, thin, flexible tube is put into a blood vessel in your groin (upper thigh), or neck. This tube is called an ablation catheter. It is then guided to your heart through the blood vessel. Radio frequency waves destroy small areas of heart tissue where abnormal heartbeats may cause an arrhythmia to start. Please see the instruction sheet given to you today.  Follow-Up: Follow-up appointment  4 weeks after ablation with Dr. Rayann Heman.   Any Other Special Instructions Will Be Listed Below (If Applicable).  Please report to the [employer] [facility] 08/01/17 at 5:30 AM  Nothing to eat or drink after midnight the night prior to procedure  Do not take any medications the morning of the procedure  Plan 1 night stay   Someone will need to drive you home at discharge     If you need a refill on your cardiac medications before your next appointment, please call your pharmacy.

## 2017-07-12 NOTE — Addendum Note (Signed)
Addended by: Drue Novel I on: 07/12/2017 02:06 PM   Modules accepted: Orders

## 2017-07-12 NOTE — Telephone Encounter (Signed)
Patient is calling and states that she went home after her appointment today and discussed everything with her husband and she wishes to cancel her Aflutter Ablation at this time. Made Dr. Rayann Heman aware. We will cancel the procedure, labs, and follow up appointment with JA. Patient will need to follow up with Dr. Domenic Polite. Patient verbalizes understanding and thanked me for the call.

## 2017-07-13 ENCOUNTER — Telehealth: Payer: Self-pay | Admitting: Internal Medicine

## 2017-07-13 DIAGNOSIS — Z779 Other contact with and (suspected) exposures hazardous to health: Secondary | ICD-10-CM | POA: Diagnosis not present

## 2017-07-13 DIAGNOSIS — I483 Typical atrial flutter: Secondary | ICD-10-CM

## 2017-07-13 DIAGNOSIS — Z6824 Body mass index (BMI) 24.0-24.9, adult: Secondary | ICD-10-CM | POA: Diagnosis not present

## 2017-07-13 DIAGNOSIS — Z01812 Encounter for preprocedural laboratory examination: Secondary | ICD-10-CM

## 2017-07-13 NOTE — Telephone Encounter (Signed)
Pt rescheduled for original date of 2/26. Advised same instructions apply as she was given yesterday.          - Please report to the [place] [facility] 08/01/17             at 5:30 AM      - Nothing to eat or drink after midnight the             night prior to procedure      - Do not take any medications the morning of             the procedure      - Plan 1 night stay       - Someone will need to drive you home at             Discharge Pre procedure labs scheduled for 2/18 Post ablation follow up w/ [facility] scheduled for 3/28. Patient verbalized understanding and agreeable to plan.    (routing to Dr. Rayann Heman for his [place])

## 2017-07-13 NOTE — Telephone Encounter (Signed)
Pt requesting same date. Advised pt I would look into this and let her know by tomorrow.

## 2017-07-13 NOTE — Telephone Encounter (Signed)
Terri Douglas is calling because Dr. Rayann Heman had her set up for surgery and she cancel it . Wants to can she reschedule it . She had time to think about it and wants to reschedule . Please call if you have any questions .

## 2017-07-24 ENCOUNTER — Telehealth: Payer: Self-pay

## 2017-07-24 ENCOUNTER — Other Ambulatory Visit: Payer: Medicare Other | Admitting: *Deleted

## 2017-07-24 DIAGNOSIS — Z01812 Encounter for preprocedural laboratory examination: Secondary | ICD-10-CM | POA: Diagnosis not present

## 2017-07-24 DIAGNOSIS — E876 Hypokalemia: Secondary | ICD-10-CM

## 2017-07-24 DIAGNOSIS — I483 Typical atrial flutter: Secondary | ICD-10-CM

## 2017-07-24 LAB — CBC WITH DIFFERENTIAL/PLATELET
Basophils Absolute: 0 10*3/uL (ref 0.0–0.2)
Basos: 0 %
EOS (ABSOLUTE): 0 10*3/uL (ref 0.0–0.4)
Eos: 1 %
Hematocrit: 39.8 % (ref 34.0–46.6)
Hemoglobin: 13.2 g/dL (ref 11.1–15.9)
Immature Grans (Abs): 0 10*3/uL (ref 0.0–0.1)
Immature Granulocytes: 0 %
Lymphocytes Absolute: 1.4 10*3/uL (ref 0.7–3.1)
Lymphs: 17 %
MCH: 29.3 pg (ref 26.6–33.0)
MCHC: 33.2 g/dL (ref 31.5–35.7)
MCV: 88 fL (ref 79–97)
Monocytes Absolute: 0.7 10*3/uL (ref 0.1–0.9)
Monocytes: 9 %
Neutrophils Absolute: 6.1 10*3/uL (ref 1.4–7.0)
Neutrophils: 73 %
Platelets: 401 10*3/uL — ABNORMAL HIGH (ref 150–379)
RBC: 4.5 x10E6/uL (ref 3.77–5.28)
RDW: 13 % (ref 12.3–15.4)
WBC: 8.3 10*3/uL (ref 3.4–10.8)

## 2017-07-24 LAB — BASIC METABOLIC PANEL
BUN/Creatinine Ratio: 14 (ref 12–28)
BUN: 12 mg/dL (ref 8–27)
CO2: 30 mmol/L — ABNORMAL HIGH (ref 20–29)
Calcium: 8.5 mg/dL — ABNORMAL LOW (ref 8.7–10.3)
Chloride: 93 mmol/L — ABNORMAL LOW (ref 96–106)
Creatinine, Ser: 0.88 mg/dL (ref 0.57–1.00)
GFR calc Af Amer: 77 mL/min/{1.73_m2} (ref >59)
GFR calc non Af Amer: 67 mL/min/{1.73_m2} (ref >59)
Glucose: 86 mg/dL (ref 65–99)
Potassium: 3.2 mmol/L — ABNORMAL LOW (ref 3.5–5.2)
Sodium: 140 mmol/L (ref 134–144)

## 2017-07-27 NOTE — Telephone Encounter (Signed)
Labs reviewed.

## 2017-07-31 ENCOUNTER — Telehealth: Payer: Self-pay | Admitting: Internal Medicine

## 2017-07-31 ENCOUNTER — Other Ambulatory Visit: Payer: Self-pay | Admitting: Internal Medicine

## 2017-07-31 ENCOUNTER — Other Ambulatory Visit: Payer: Medicare Other | Admitting: *Deleted

## 2017-07-31 LAB — BASIC METABOLIC PANEL
BUN/Creatinine Ratio: 10 — ABNORMAL LOW (ref 12–28)
BUN: 9 mg/dL (ref 8–27)
CO2: 29 mmol/L (ref 20–29)
Calcium: 8.8 mg/dL (ref 8.7–10.3)
Chloride: 95 mmol/L — ABNORMAL LOW (ref 96–106)
Creatinine, Ser: 0.94 mg/dL (ref 0.57–1.00)
GFR calc Af Amer: 71 mL/min/{1.73_m2} (ref >59)
GFR calc non Af Amer: 62 mL/min/{1.73_m2} (ref >59)
Glucose: 96 mg/dL (ref 65–99)
Potassium: 3.1 mmol/L — ABNORMAL LOW (ref 3.5–5.2)
Sodium: 143 mmol/L (ref 134–144)

## 2017-07-31 NOTE — Telephone Encounter (Signed)
Notes recorded by Thompson Grayer, RN on 07/31/2017 at 4:48 PM EST Reviewed with Dr. Rayann Heman and orders given for pt to take potassium 20 meq now and at bedtime today.  Note above copied from lab result note.  I spoke with pt and gave her these instructions.  I told her procedure is on as planned.

## 2017-07-31 NOTE — Telephone Encounter (Signed)
New Message  Pt calling to check on her lab results from this morning and to see if she is still having her procedure tomorrow. Please call

## 2017-07-31 NOTE — Anesthesia Preprocedure Evaluation (Addendum)
Anesthesia Evaluation  Patient identified by MRN, date of birth, ID band Patient awake    Reviewed: Allergy & Precautions, NPO status , Patient's Chart, lab work & pertinent test results, reviewed documented beta blocker date and time   History of Anesthesia Complications (+) PONV and history of anesthetic complications  Airway Mallampati: II  TM Distance: >3 FB Neck ROM: Full    Dental  (+) Dental Advisory Given   Pulmonary neg pulmonary ROS,    breath sounds clear to auscultation       Cardiovascular Exercise Tolerance: Good hypertension, Pt. on medications and Pt. on home beta blockers + Past MI  + dysrhythmias Atrial Fibrillation  Rhythm:Regular Rate:Normal  '19 TTE - EF 55-60%, no pathology   Neuro/Psych Anxiety Depression negative neurological ROS     GI/Hepatic Neg liver ROS, GERD  Medicated and Controlled,  Endo/Other  Pre-DM  Renal/GU negative Renal ROS     Musculoskeletal  (+) Arthritis , Gout   Abdominal   Peds  Hematology negative hematology ROS (+)   Anesthesia Other Findings Hypokalemia  Reproductive/Obstetrics negative OB ROS                            Anesthesia Physical  Anesthesia Plan  ASA: II  Anesthesia Plan: MAC   Post-op Pain Management:    Induction: Intravenous  PONV Risk Score and Plan: 3 and Treatment may vary due to age or medical condition and Propofol infusion  Airway Management Planned: Natural Airway and Simple Face Mask  Additional Equipment: None  Intra-op Plan:   Post-operative Plan:   Informed Consent: I have reviewed the patients History and Physical, chart, labs and discussed the procedure including the risks, benefits and alternatives for the proposed anesthesia with the patient or authorized representative who has indicated his/her understanding and acceptance.   Dental advisory given  Plan Discussed with: CRNA  Anesthesia Plan  Comments:        Anesthesia Quick Evaluation

## 2017-08-01 ENCOUNTER — Encounter (HOSPITAL_COMMUNITY)
Admission: RE | Disposition: A | Discharge status: Home / Self Care | Payer: Self-pay | Source: Ambulatory Visit | Attending: Internal Medicine

## 2017-08-01 ENCOUNTER — Ambulatory Visit (HOSPITAL_COMMUNITY): Payer: Medicare Other | Admitting: Anesthesiology

## 2017-08-01 ENCOUNTER — Ambulatory Visit (HOSPITAL_COMMUNITY)
Admission: RE | Admit: 2017-08-01 | Discharge: 2017-08-01 | Disposition: A | Discharge status: Home / Self Care | Payer: Medicare Other | Source: Ambulatory Visit | Attending: Internal Medicine | Admitting: Internal Medicine

## 2017-08-01 ENCOUNTER — Encounter (HOSPITAL_COMMUNITY): Payer: Self-pay

## 2017-08-01 ENCOUNTER — Encounter (HOSPITAL_COMMUNITY): Payer: Self-pay | Admitting: Certified Registered Nurse Anesthetist

## 2017-08-01 ENCOUNTER — Ambulatory Visit (HOSPITAL_COMMUNITY): Admit: 2017-08-01 | Payer: Medicare Other | Admitting: Internal Medicine

## 2017-08-01 DIAGNOSIS — R7303 Prediabetes: Secondary | ICD-10-CM | POA: Insufficient documentation

## 2017-08-01 DIAGNOSIS — I252 Old myocardial infarction: Secondary | ICD-10-CM | POA: Insufficient documentation

## 2017-08-01 DIAGNOSIS — E785 Hyperlipidemia, unspecified: Secondary | ICD-10-CM | POA: Diagnosis not present

## 2017-08-01 DIAGNOSIS — Z7901 Long term (current) use of anticoagulants: Secondary | ICD-10-CM | POA: Insufficient documentation

## 2017-08-01 DIAGNOSIS — I10 Essential (primary) hypertension: Secondary | ICD-10-CM | POA: Insufficient documentation

## 2017-08-01 DIAGNOSIS — I4892 Unspecified atrial flutter: Secondary | ICD-10-CM | POA: Diagnosis not present

## 2017-08-01 DIAGNOSIS — F418 Other specified anxiety disorders: Secondary | ICD-10-CM | POA: Diagnosis not present

## 2017-08-01 DIAGNOSIS — M1990 Unspecified osteoarthritis, unspecified site: Secondary | ICD-10-CM | POA: Insufficient documentation

## 2017-08-01 DIAGNOSIS — K219 Gastro-esophageal reflux disease without esophagitis: Secondary | ICD-10-CM | POA: Diagnosis not present

## 2017-08-01 DIAGNOSIS — I483 Typical atrial flutter: Secondary | ICD-10-CM | POA: Diagnosis not present

## 2017-08-01 DIAGNOSIS — G4700 Insomnia, unspecified: Secondary | ICD-10-CM | POA: Insufficient documentation

## 2017-08-01 DIAGNOSIS — I4891 Unspecified atrial fibrillation: Secondary | ICD-10-CM | POA: Diagnosis not present

## 2017-08-01 DIAGNOSIS — M109 Gout, unspecified: Secondary | ICD-10-CM | POA: Insufficient documentation

## 2017-08-01 HISTORY — PX: A-FLUTTER ABLATION: EP1230

## 2017-08-01 LAB — BASIC METABOLIC PANEL
Anion gap: 11 (ref 5–15)
BUN: 15 mg/dL (ref 6–20)
CO2: 30 mmol/L (ref 22–32)
Calcium: 8.5 mg/dL — ABNORMAL LOW (ref 8.9–10.3)
Chloride: 101 mmol/L (ref 101–111)
Creatinine, Ser: 0.99 mg/dL (ref 0.44–1.00)
GFR calc Af Amer: >60 mL/min (ref >60)
GFR calc non Af Amer: 56 mL/min — ABNORMAL LOW (ref >60)
Glucose, Bld: 102 mg/dL — ABNORMAL HIGH (ref 65–99)
Potassium: 3.8 mmol/L (ref 3.5–5.1)
Sodium: 142 mmol/L (ref 135–145)

## 2017-08-01 SURGERY — A-FLUTTER ABLATION
Anesthesia: Monitor Anesthesia Care

## 2017-08-01 SURGERY — A-FLUTTER ABLATION
Anesthesia: General

## 2017-08-01 MED ORDER — SODIUM CHLORIDE 0.9% FLUSH: 3.0000 mL | INTRAVENOUS | Status: DC | PRN

## 2017-08-01 MED ORDER — HYDROCODONE-ACETAMINOPHEN 5-325 MG PO TABS: 1.0000 | ORAL_TABLET | ORAL | Status: DC | PRN

## 2017-08-01 MED ORDER — BUPIVACAINE HCL (PF) 0.25 % IJ SOLN
INTRAMUSCULAR | Status: DC | PRN
  Administered 2017-08-01: 10 mL

## 2017-08-01 MED ORDER — BUPIVACAINE HCL (PF) 0.25 % IJ SOLN
INTRAMUSCULAR | Status: AC
  Filled 2017-08-01: qty 30

## 2017-08-01 MED ORDER — DEXAMETHASONE SODIUM PHOSPHATE 10 MG/ML IJ SOLN
INTRAMUSCULAR | Status: DC | PRN
  Administered 2017-08-01: 5 mg via INTRAVENOUS

## 2017-08-01 MED ORDER — SODIUM CHLORIDE 0.9 % IV SOLN
INTRAVENOUS | Status: DC
  Administered 2017-08-01: 06:00:00 via INTRAVENOUS

## 2017-08-01 MED ORDER — FENTANYL CITRATE (PF) 100 MCG/2ML IJ SOLN
INTRAMUSCULAR | Status: DC | PRN
  Administered 2017-08-01: 50 ug via INTRAVENOUS
  Administered 2017-08-01: 25 ug via INTRAVENOUS

## 2017-08-01 MED ORDER — POTASSIUM CHLORIDE ER 20 MEQ PO TBCR: 20.0000 meq | EXTENDED_RELEASE_TABLET | Freq: Every day | ORAL | 1 refills | Status: DC

## 2017-08-01 MED ORDER — SODIUM CHLORIDE 0.9 % IV SOLN: 250.0000 mL | INTRAVENOUS | Status: DC | PRN

## 2017-08-01 MED ORDER — PROPOFOL 500 MG/50ML IV EMUL
INTRAVENOUS | Status: DC | PRN
  Administered 2017-08-01: 60 ug/kg/min via INTRAVENOUS

## 2017-08-01 MED ORDER — PROPOFOL 10 MG/ML IV BOLUS
INTRAVENOUS | Status: DC | PRN
  Administered 2017-08-01 (×2): 10 mg via INTRAVENOUS
  Administered 2017-08-01: 15 mg via INTRAVENOUS

## 2017-08-01 MED ORDER — SODIUM CHLORIDE 0.9% FLUSH: 3.0000 mL | Freq: Two times a day (BID) | INTRAVENOUS | Status: DC

## 2017-08-01 MED ORDER — ONDANSETRON HCL 4 MG/2ML IJ SOLN
INTRAMUSCULAR | Status: DC | PRN
  Administered 2017-08-01: 4 mg via INTRAVENOUS

## 2017-08-01 SURGICAL SUPPLY — 9 items
BLANKET WARM UNDERBOD FULL ACC (MISCELLANEOUS) ×2 IMPLANT
CATH EZ STEER NAV 8MM F-J CUR (ABLATOR) ×1 IMPLANT
CATH WEBSTER BI DIR CS D-F CRV (CATHETERS) ×1 IMPLANT
PACK EP LATEX FREE (CUSTOM PROCEDURE TRAY) ×2
PACK EP LF (CUSTOM PROCEDURE TRAY) ×1 IMPLANT
PAD DEFIB LIFELINK (PAD) ×2 IMPLANT
PATCH CARTO3 (PAD) ×1 IMPLANT
SHEATH PINNACLE 7F 10CM (SHEATH) ×1 IMPLANT
SHEATH PINNACLE 8F 10CM (SHEATH) ×1 IMPLANT

## 2017-08-01 NOTE — Interval H&P Note (Signed)
History and Physical Interval Note:  08/01/2017 7:27 AM  Terri Douglas  has presented today for surgery, with the diagnosis of a flutter  The various methods of treatment have been discussed with the patient and family. After consideration of risks, benefits and other options for treatment, the patient has consented to  Procedure(s): A-FLUTTER ABLATION (N/A) as a surgical intervention .  The patient's history has been reviewed, patient examined, no change in status, stable for surgery.  I have reviewed the patient's chart and labs.  Questions were answered to the patient's satisfaction.     Thompson Grayer

## 2017-08-01 NOTE — Anesthesia Procedure Notes (Signed)
Procedure Name: MAC Date/Time: 08/01/2017 7:46 AM Performed by: White, Amedeo Plenty, CRNA Pre-anesthesia Checklist: Patient identified, Suction available, Emergency Drugs available and Patient being monitored Patient Re-evaluated:Patient Re-evaluated prior to induction Oxygen Delivery Method: Simple face mask

## 2017-08-01 NOTE — Transfer of Care (Signed)
Immediate Anesthesia Transfer of Care Note  Patient: Terri Douglas  Procedure(s) Performed: A-FLUTTER ABLATION (N/A )  Patient Location: Cath Lab  Anesthesia Type:MAC  Level of Consciousness: awake, alert , oriented and patient cooperative  Airway & Oxygen Therapy: Patient Spontanous Breathing  Post-op Assessment: Report given to RN and Post -op Vital signs reviewed and stable  Post vital signs: Reviewed and stable  Last Vitals:  Vitals:   08/01/17 0534 08/01/17 0542  BP: 132/69   Pulse: 61 65  Resp:  18  Temp: 36.5 C 36.7 C  SpO2: 97%     Last Pain:  Vitals:   08/01/17 0542  TempSrc: Oral         Complications: No apparent anesthesia complications

## 2017-08-01 NOTE — Anesthesia Postprocedure Evaluation (Signed)
Anesthesia Post Note  Patient: Terri Douglas  Procedure(s) Performed: A-FLUTTER ABLATION (N/A )     Patient location during evaluation: PACU Anesthesia Type: MAC Level of consciousness: awake and alert Pain management: pain level controlled Vital Signs Assessment: post-procedure vital signs reviewed and stable Respiratory status: spontaneous breathing, nonlabored ventilation and respiratory function stable Cardiovascular status: stable and blood pressure returned to baseline Postop Assessment: no apparent nausea or vomiting Anesthetic complications: no    Last Vitals:  Vitals:   08/01/17 0930 08/01/17 0935  BP: (!) 123/55 135/60  Pulse: (!) 59 63  Resp: 12 16  Temp:    SpO2: 96% 95%    Last Pain:  Vitals:   08/01/17 0935  TempSrc: Temporal                 Audry Pili

## 2017-08-01 NOTE — Progress Notes (Signed)
Site area: 2 rt fv sheaths pulled and pressure held by [place] Party Site Prior to Removal:  Level 0 Pressure Applied For:  20 minutes Manual:   yes Patient Status During Pull:  yes Post Pull Site:  Level  0 Post Pull Instructions Given:  yes Post Pull Pulses Present: palpable Dressing Applied:  Gauze and tegaderm Bedrest begins @ 0935 Comments:  IV saline locked

## 2017-08-01 NOTE — Discharge Instructions (Signed)
**Note -identified via Obfuscation** Cardiac Ablation, Care After °This sheet gives you information about how to care for yourself after your procedure. Your health care provider may also give you more specific instructions. If you have problems or questions, contact your health care provider. °What can I expect after the procedure? °After the procedure, it is common to have: °· Bruising around your puncture site. °· Tenderness around your puncture site. °· Skipped heartbeats. °· Tiredness (fatigue). ° °Follow these instructions at home: °Puncture site care °· Follow instructions from your health care provider about how to take care of your puncture site. Make sure you: °? Wash your hands with soap and water before you change your bandage (dressing). If soap and water are not available, use hand sanitizer. °? Change your dressing as told by your health care provider. °? Leave stitches (sutures), skin glue, or adhesive strips in place. These skin closures may need to stay in place for up to 2 weeks. If adhesive strip edges start to loosen and curl up, you may trim the loose edges. Do not remove adhesive strips completely unless your health care provider tells you to do that. °· Check your puncture site every day for signs of infection. Check for: °? Redness, swelling, or pain. °? Fluid or blood. If your puncture site starts to bleed, lie down on your back, apply firm pressure to the area, and contact your health care provider. °? Warmth. °? Pus or a bad smell. °Driving °· Ask your health care provider when it is safe for you to drive again after the procedure. °· Do not drive or use heavy machinery while taking prescription pain medicine. °· Do not drive for 24 hours if you were given a medicine to help you relax (sedative) during your procedure. °Activity °· Avoid activities that take a lot of effort for at least 3 days after your procedure. °· Do not lift anything that is heavier than 10 lb (4.5 kg), or the limit that you are told, until your health  care provider says that it is safe. °· Return to your normal activities as told by your health care provider. Ask your health care provider what activities are safe for you. °General instructions °· Take over-the-counter and prescription medicines only as told by your health care provider. °· Do not use any products that contain nicotine or tobacco, such as cigarettes and e-cigarettes. If you need help quitting, ask your health care provider. °· Do not take baths, swim, or use a hot tub until your health care provider approves. °· Do not drink alcohol for 24 hours after your procedure. °· Keep all follow-up visits as told by your health care provider. This is important. °Contact a health care provider if: °· You have redness, mild swelling, or pain around your puncture site. °· You have fluid or blood coming from your puncture site that stops after applying firm pressure to the area. °· Your puncture site feels warm to the touch. °· You have pus or a bad smell coming from your puncture site. °· You have a fever. °· You have chest pain or discomfort that spreads to your neck, jaw, or arm. °· You are sweating a lot. °· You feel nauseous. °· You have a fast or irregular heartbeat. °· You have shortness of breath. °· You are dizzy or light-headed and feel the need to lie down. °· You have pain or numbness in the arm or leg closest to your puncture site. °Get help right away if: °· Your puncture  **Note -identified via Obfuscation** site suddenly swells. °· Your puncture site is bleeding and the bleeding does not stop after applying firm pressure to the area. °These symptoms may represent a serious problem that is an emergency. Do not wait to see if the symptoms will go away. Get medical help right away. Call your local emergency services (911 in the [place] Do not drive yourself to the hospital. °Summary °· After the procedure, it is normal to have bruising and tenderness at the puncture site in your groin, neck, or forearm. °· Check your puncture site every  day for signs of infection. °· Get help right away if your puncture site is bleeding and the bleeding does not stop after applying firm pressure to the area. This is a medical emergency. °This information is not intended to replace advice given to you by your health care provider. Make sure you discuss any questions you have with your health care provider. °Document Released: 09/01/2016 Document Revised: 09/01/2016 Document Reviewed: 09/01/2016 °Elsevier Interactive Patient Education © 2018 [employer] ° °Femoral Site Care °Refer to this sheet in the next few weeks. These instructions provide you with information about caring for yourself after your procedure. Your health care provider may also give you more specific instructions. Your treatment has been planned according to current medical practices, but problems sometimes occur. Call your health care provider if you have any problems or questions after your procedure. °What can I expect after the procedure? °After your procedure, it is typical to have the following: °· Bruising at the site that usually fades within 1-2 weeks. °· Blood collecting in the tissue (hematoma) that may be painful to the touch. It should usually decrease in size and tenderness within 1-2 weeks. ° °Follow these instructions at home: °· Take medicines only as directed by your health care provider. °· You may shower 24-48 hours after the procedure or as directed by your health care provider. Remove the bandage (dressing) and gently wash the site with plain soap and water. Pat the area dry with a clean towel. Do not rub the site, because this may cause bleeding. °· Do not take baths, swim, or use a hot tub until your health care provider approves. °· Check your insertion site every day for redness, swelling, or drainage. °· Do not apply powder or lotion to the site. °· Limit use of stairs to twice a day for the first 2-3 days or as directed by your health care provider. °· Do not squat for  the first 2-3 days or as directed by your health care provider. °· Do not lift over 10 lb (4.5 kg) for 5 days after your procedure or as directed by your health care provider. °· Ask your health care provider when it is okay to: °? Return to work or school. °? Resume usual physical activities or sports. °? Resume sexual activity. °· Do not drive home if you are discharged the same day as the procedure. Have someone else drive you. °· You may drive 24 hours after the procedure unless otherwise instructed by your health care provider. °· Do not operate machinery or power tools for 24 hours after the procedure or as directed by your health care provider. °· If your procedure was done as an outpatient procedure, which means that you went home the same day as your procedure, a responsible adult should be with you for the first 24 hours after you arrive home. °· Keep all follow-up visits as directed by your health care provider. This  **Note -identified via Obfuscation** is important. °Contact a health care provider if: °· You have a fever. °· You have chills. °· You have increased bleeding from the site. Hold pressure on the site. °Get help right away if: °· You have unusual pain at the site. °· You have redness, warmth, or swelling at the site. °· You have drainage (other than a small amount of blood on the dressing) from the site. °· The site is bleeding, and the bleeding does not stop after 30 minutes of holding steady pressure on the site. °· Your leg or foot becomes pale, cool, tingly, or numb. °This information is not intended to replace advice given to you by your health care provider. Make sure you discuss any questions you have with your health care provider. °Document Released: 01/24/2014 Document Revised: 10/29/2015 Document Reviewed: 12/10/2013 °Elsevier Interactive Patient Education © 2018 [employer] Inc. ° °

## 2017-08-02 ENCOUNTER — Encounter (HOSPITAL_COMMUNITY): Payer: Self-pay | Admitting: Internal Medicine

## 2017-08-08 ENCOUNTER — Other Ambulatory Visit: Payer: Self-pay

## 2017-08-08 ENCOUNTER — Telehealth: Payer: Self-pay | Admitting: Internal Medicine

## 2017-08-08 MED ORDER — POTASSIUM CHLORIDE ER 10 MEQ PO TBCR: 20.0000 meq | EXTENDED_RELEASE_TABLET | Freq: Every day | ORAL | 3 refills | Status: DC

## 2017-08-08 NOTE — Telephone Encounter (Signed)
New message   Pharmacy calling to request order be written for 10MEQ Potassium.  Pt c/o medication issue:  1. Name of Medication: potassium chloride 20 MEQ TBCR  2. How are you currently taking this medication (dosage and times per day)? n/a  3. Are you having a reaction (difficulty breathing--STAT)? NO  4. What is your medication issue? Pharmacy states pill is too big for patient to swallow, requesting order be written for 10 MEQ.

## 2017-08-22 ENCOUNTER — Encounter: Payer: Self-pay | Admitting: Internal Medicine

## 2017-08-31 ENCOUNTER — Ambulatory Visit (INDEPENDENT_AMBULATORY_CARE_PROVIDER_SITE_OTHER): Payer: Medicare Other | Admitting: Internal Medicine

## 2017-08-31 ENCOUNTER — Ambulatory Visit: Payer: Medicare Other | Admitting: Internal Medicine

## 2017-08-31 ENCOUNTER — Encounter: Payer: Self-pay | Admitting: Internal Medicine

## 2017-08-31 VITALS — BP 130/80 | HR 62 | Ht 63.0 in | Wt 134.4 lb

## 2017-08-31 DIAGNOSIS — I483 Typical atrial flutter: Secondary | ICD-10-CM | POA: Diagnosis not present

## 2017-08-31 DIAGNOSIS — I2510 Atherosclerotic heart disease of native coronary artery without angina pectoris: Secondary | ICD-10-CM

## 2017-08-31 MED ORDER — METOPROLOL SUCCINATE ER 25 MG PO TB24: ORAL_TABLET | ORAL | 0 refills | Status: DC

## 2017-08-31 NOTE — Progress Notes (Signed)
PCP: Kathyrn Drown, MD Primary Cardiologist: Dr Benedetto Coons is a 72 y.o. female who presents today for routine electrophysiology followup.  Since his recent atrial flutter ablation, the patient reports doing very well.  she denies procedure related complications and is pleased with the results of the procedure.  Today, she denies symptoms of palpitations, chest pain, shortness of breath,  lower extremity edema, dizziness, presyncope, or syncope.  The patient is otherwise without complaint today.   Past Medical History:  Diagnosis Date  . Arthritis   . Gall stones   . GERD (gastroesophageal reflux disease)   . History of cardiac catheterization 2005   Minor elevation in cardiac enzymes however no significant obstructive CAD  . Hyperlipidemia   . Hypertension   . Insomnia   . Prediabetes    Past Surgical History:  Procedure Laterality Date  . A-FLUTTER ABLATION N/A 08/01/2017   Procedure: A-FLUTTER ABLATION;  Surgeon: Thompson Grayer, MD;  Location: [facility];  Service: Cardiovascular;  Laterality: N/A;  . ABDOMINAL HYSTERECTOMY    . BACK SURGERY  1992  . CARDIAC CATHETERIZATION     BACK IN 2004  SHE THINKS IT CAME BACK 'NORMAL'  . CHOLECYSTECTOMY N/A 06/24/2015   Procedure: LAPAROSCOPIC CHOLECYSTECTOMY;  Surgeon: Mickeal Skinner, MD;  Location: [facility];  Service: General;  Laterality: N/A;  . COLONOSCOPY N/A 08/30/2012   Procedure: COLONOSCOPY;  Surgeon: Rogene Houston, MD;  Location: [facility];  Service: Endoscopy;  Laterality: N/A;  830  . DILATION AND CURETTAGE OF UTERUS    . ESOPHAGOGASTRODUODENOSCOPY  04/15/2011   Procedure: ESOPHAGOGASTRODUODENOSCOPY (EGD);  Surgeon: Rogene Houston, MD;  Location: [facility];  Service: Endoscopy;  Laterality: N/A;  11:30  . ESOPHAGOGASTRODUODENOSCOPY N/A 05/27/2016   Procedure: ESOPHAGOGASTRODUODENOSCOPY (EGD);  Surgeon: Rogene Houston, MD;  Location: [facility];  Service: Endoscopy;  Laterality: N/A;  11:15    . EYE SURGERY     cataract removal  . NECK SURGERY  x3  . VAGINAL HYSTERECTOMY      ROS- all systems are personally reviewed and negatives except as per HPI above  Current Outpatient Medications  Medication Sig Dispense Refill  . allopurinol (ZYLOPRIM) 100 MG tablet Take 100 mg by mouth daily.    Marland Kitchen ALPRAZolam (XANAX) 1 MG tablet TAKE ONE TABLET DAILY AT BEDTIME AS NEEDED (Patient taking differently: Take 1 mg by mouth at bedtime as needed for anxiety. ) 90 tablet 1  . apixaban (ELIQUIS) 5 MG TABS tablet Take 1 tablet (5 mg total) by mouth 2 (two) times daily. 60 tablet 5  . conjugated estrogens (PREMARIN) vaginal cream Place 1 Applicatorful vaginally every other day.    Mariane Baumgarten Sodium (PHILLIPS STOOL SOFTENER PO) Take 2 capsules by mouth at bedtime.     Marland Kitchen estradiol (ESTRACE) 1 MG tablet Take 1 mg by mouth daily.    . finasteride (PROSCAR) 5 MG tablet Take 2.5 mg by mouth daily.   4  . metoprolol succinate (TOPROL-XL) 25 MG 24 hr tablet Take 1 tablet (25 mg total) by mouth daily. 30 tablet 5  . minoxidil (ROGAINE) 2 % external solution Apply 1 application topically daily.     Marland Kitchen OVER THE COUNTER MEDICATION Take 1 tablet by mouth at bedtime. Sennosides Stimulant Laxative    . pantoprazole (PROTONIX) 40 MG tablet Take 1 tablet (40 mg total) by mouth 2 (two) times daily before a meal. 60 tablet 3  . potassium chloride (K-DUR)  10 MEQ tablet Take 2 tablets (20 mEq total) by mouth daily. 90 tablet 3  . pravastatin (PRAVACHOL) 80 MG tablet TAKE ONE (1) TABLET EACH DAY (Patient taking differently: Take 80 mg by mouth daily. ) 90 tablet 1  . sertraline (ZOLOFT) 50 MG tablet Take 1 tablet (50 mg total) by mouth daily. 30 tablet 5  . sucralfate (CARAFATE) 1 g tablet TAKE ONE TABLET BY MOUTH TWICE DAILY AS NEEDED (Patient taking differently: TAKE 1 G BY MOUTH TWICE DAILY AS NEEDED FOR STOMACH) 60 tablet 2  . torsemide (DEMADEX) 20 MG tablet Take 1 tablet (20 mg total) by mouth every morning. 90  tablet 1   No current facility-administered medications for this visit.     Physical Exam: Vitals:   08/31/17 1551  BP: 130/80  Pulse: 62  Weight: 61 kg (134 lb 6.4 oz)  Height: 5\' 3"  (1.6 m)    GEN- The patient is well appearing, alert and oriented x 3 today.   Head- normocephalic, atraumatic Eyes-  Sclera clear, conjunctiva pink Ears- hearing intact Oropharynx- clear Lungs- Clear to ausculation bilaterally, normal work of breathing Heart- Regular rate and rhythm, no murmurs, rubs or gallops, PMI not laterally displaced GI- soft, NT, ND, + BS Extremities- no clubbing, cyanosis, or edema  EKG tracing ordered today is personally reviewed and shows sinus rhythm, normal ekg  Assessment and Plan:  1. Atrial flutter Doing well s/p ablation chads2vasc score is 3 Stop anticoagulation as we have not documented afib Reduce topro.l to 12.5mg  daily x 2 weeks then stop  We discussed lifestyle modification today  Return as needed  Thompson Grayer MD, [facility] 08/31/2017 4:07 PM

## 2017-08-31 NOTE — Patient Instructions (Signed)
Medication Instructions:  Your physician has recommended you make the following change in your medication:  1.  Stop taking Eliquis 2.  Decrease your Toprol XL to 1/2 tablet by mouth daily x 2 weeks and then stop.  Labwork: None ordered.  Testing/Procedures: None ordered.  Follow-Up: Your physician wants you to follow-up in: as needed with Dr. Rayann Heman.     Any Other Special Instructions Will Be Listed Below (If Applicable).  If you need a refill on your cardiac medications before your next appointment, please call your pharmacy.

## 2017-09-09 ENCOUNTER — Other Ambulatory Visit: Payer: Self-pay | Admitting: Family Medicine

## 2017-09-15 ENCOUNTER — Inpatient Hospital Stay: Admit: 2017-09-15 | Payer: MEDICARE | Primary: Physician Assistant

## 2017-09-15 ENCOUNTER — Encounter

## 2017-09-15 DIAGNOSIS — I10 Essential (primary) hypertension: Secondary | ICD-10-CM

## 2017-09-15 LAB — METABOLIC PANEL, COMPREHENSIVE
A-G Ratio: 1.3 (ref 0.8–1.7)
ALT (SGPT): 25 U/L (ref 13–56)
AST (SGOT): 22 U/L (ref 15–37)
Albumin: 4 g/dL (ref 3.4–5.0)
Alk. phosphatase: 91 U/L (ref 45–117)
Anion gap: 7 mmol/L (ref 3.0–18)
BUN/Creatinine ratio: 26 — ABNORMAL HIGH (ref 12–20)
BUN: 27 MG/DL — ABNORMAL HIGH (ref 7.0–18)
Bilirubin, total: 0.7 MG/DL (ref 0.2–1.0)
CO2: 32 mmol/L (ref 21–32)
Calcium: 9.5 MG/DL (ref 8.5–10.1)
Chloride: 101 mmol/L (ref 100–108)
Creatinine: 1.02 MG/DL (ref 0.6–1.3)
GFR est AA: >60 mL/min/{1.73_m2} (ref >60)
GFR est non-AA: 53 mL/min/{1.73_m2} — ABNORMAL LOW (ref >60)
Globulin: 3.1 g/dL (ref 2.0–4.0)
Glucose: 96 mg/dL (ref 74–99)
Potassium: 3.5 mmol/L (ref 3.5–5.5)
Protein, total: 7.1 g/dL (ref 6.4–8.2)
Sodium: 140 mmol/L (ref 136–145)

## 2017-09-15 LAB — LIPID PANEL
CHOL/HDL Ratio: 3.1 (ref 0–5.0)
Cholesterol, total: 217 MG/DL — ABNORMAL HIGH (ref <200)
HDL Cholesterol: 69 MG/DL — ABNORMAL HIGH (ref 40–60)
LDL, calculated: 129.8 MG/DL — ABNORMAL HIGH (ref 0–100)
Triglyceride: 91 MG/DL (ref <150)
VLDL, calculated: 18.2 MG/DL

## 2017-09-15 LAB — CBC WITH AUTOMATED DIFF
ABS. BASOPHILS: 0 10*3/uL (ref 0.0–0.1)
ABS. EOSINOPHILS: 0.2 10*3/uL (ref 0.0–0.4)
ABS. LYMPHOCYTES: 1.4 10*3/uL (ref 0.9–3.6)
ABS. MONOCYTES: 0.5 10*3/uL (ref 0.05–1.2)
ABS. NEUTROPHILS: 2.9 10*3/uL (ref 1.8–8.0)
BASOPHILS: 0 % (ref 0–2)
EOSINOPHILS: 5 % (ref 0–5)
HCT: 43 % (ref 35.0–45.0)
HGB: 14.8 g/dL (ref 12.0–16.0)
LYMPHOCYTES: 28 % (ref 21–52)
MCH: 30.3 PG (ref 24.0–34.0)
MCHC: 34.4 g/dL (ref 31.0–37.0)
MCV: 87.9 FL (ref 74.0–97.0)
MONOCYTES: 9 % (ref 3–10)
MPV: 9.8 FL (ref 9.2–11.8)
NEUTROPHILS: 58 % (ref 40–73)
PLATELET: 189 10*3/uL (ref 135–420)
RBC: 4.89 M/uL (ref 4.20–5.30)
RDW: 13.1 % (ref 11.6–14.5)
WBC: 5.1 10*3/uL (ref 4.6–13.2)

## 2017-09-15 LAB — TSH 3RD GENERATION: TSH: 0.19 u[IU]/mL — ABNORMAL LOW (ref 0.36–3.74)

## 2017-09-15 LAB — VITAMIN D, 25 HYDROXY: Vitamin D 25-Hydroxy: 27.4 ng/mL — ABNORMAL LOW (ref 30–100)

## 2017-10-05 DIAGNOSIS — D224 Melanocytic nevi of scalp and neck: Secondary | ICD-10-CM | POA: Diagnosis not present

## 2017-10-05 DIAGNOSIS — L814 Other melanin hyperpigmentation: Secondary | ICD-10-CM | POA: Diagnosis not present

## 2017-10-05 DIAGNOSIS — Z85828 Personal history of other malignant neoplasm of skin: Secondary | ICD-10-CM | POA: Diagnosis not present

## 2017-10-05 DIAGNOSIS — L650 Telogen effluvium: Secondary | ICD-10-CM | POA: Diagnosis not present

## 2017-10-05 DIAGNOSIS — D2261 Melanocytic nevi of right upper limb, including shoulder: Secondary | ICD-10-CM | POA: Diagnosis not present

## 2017-10-05 DIAGNOSIS — D2271 Melanocytic nevi of right lower limb, including hip: Secondary | ICD-10-CM | POA: Diagnosis not present

## 2017-10-05 DIAGNOSIS — L570 Actinic keratosis: Secondary | ICD-10-CM | POA: Diagnosis not present

## 2017-10-06 ENCOUNTER — Ambulatory Visit: Payer: Medicare Other | Admitting: Cardiology

## 2017-12-02 ENCOUNTER — Other Ambulatory Visit: Payer: Self-pay | Admitting: Family Medicine

## 2017-12-04 ENCOUNTER — Other Ambulatory Visit: Payer: Self-pay | Admitting: Family Medicine

## 2017-12-04 NOTE — Telephone Encounter (Signed)
May have 3 months on all of these refills needs follow-up office visit by fall

## 2017-12-19 ENCOUNTER — Ambulatory Visit (INDEPENDENT_AMBULATORY_CARE_PROVIDER_SITE_OTHER): Payer: Medicare Other | Admitting: Internal Medicine

## 2017-12-27 DIAGNOSIS — Z1231 Encounter for screening mammogram for malignant neoplasm of breast: Secondary | ICD-10-CM | POA: Diagnosis not present

## 2018-01-02 ENCOUNTER — Encounter

## 2018-01-02 ENCOUNTER — Inpatient Hospital Stay: Admit: 2018-01-02 | Payer: MEDICARE | Primary: Physician Assistant

## 2018-01-02 DIAGNOSIS — E039 Hypothyroidism, unspecified: Secondary | ICD-10-CM

## 2018-01-02 LAB — CBC WITH AUTOMATED DIFF
ABS. BASOPHILS: 0 10*3/uL (ref 0.0–0.1)
ABS. EOSINOPHILS: 0.3 10*3/uL (ref 0.0–0.4)
ABS. LYMPHOCYTES: 1.4 10*3/uL (ref 0.9–3.6)
ABS. MONOCYTES: 0.3 10*3/uL (ref 0.05–1.2)
ABS. NEUTROPHILS: 2.5 10*3/uL (ref 1.8–8.0)
BASOPHILS: 1 % (ref 0–2)
EOSINOPHILS: 6 % — ABNORMAL HIGH (ref 0–5)
HCT: 43.9 % (ref 35.0–45.0)
HGB: 14.8 g/dL (ref 12.0–16.0)
LYMPHOCYTES: 31 % (ref 21–52)
MCH: 29.8 PG (ref 24.0–34.0)
MCHC: 33.7 g/dL (ref 31.0–37.0)
MCV: 88.5 FL (ref 74.0–97.0)
MONOCYTES: 7 % (ref 3–10)
MPV: 9.6 FL (ref 9.2–11.8)
NEUTROPHILS: 55 % (ref 40–73)
PLATELET: 204 10*3/uL (ref 135–420)
RBC: 4.96 M/uL (ref 4.20–5.30)
RDW: 13 % (ref 11.6–14.5)
WBC: 4.6 10*3/uL (ref 4.6–13.2)

## 2018-01-02 LAB — METABOLIC PANEL, COMPREHENSIVE
A-G Ratio: 1.3 (ref 0.8–1.7)
ALT (SGPT): 31 U/L (ref 13–56)
AST (SGOT): 28 U/L (ref 10–38)
Albumin: 4.1 g/dL (ref 3.4–5.0)
Alk. phosphatase: 101 U/L (ref 45–117)
Anion gap: 6 mmol/L (ref 3.0–18)
BUN/Creatinine ratio: 20 (ref 12–20)
BUN: 20 MG/DL — ABNORMAL HIGH (ref 7.0–18)
Bilirubin, total: 0.6 MG/DL (ref 0.2–1.0)
CO2: 33 mmol/L — ABNORMAL HIGH (ref 21–32)
Calcium: 9.6 MG/DL (ref 8.5–10.1)
Chloride: 103 mmol/L (ref 100–111)
Creatinine: 1.02 MG/DL (ref 0.6–1.3)
GFR est AA: >60 mL/min/{1.73_m2} (ref >60)
GFR est non-AA: 53 mL/min/{1.73_m2} — ABNORMAL LOW (ref >60)
Globulin: 3.2 g/dL (ref 2.0–4.0)
Glucose: 94 mg/dL (ref 74–99)
Potassium: 3.9 mmol/L (ref 3.5–5.5)
Protein, total: 7.3 g/dL (ref 6.4–8.2)
Sodium: 142 mmol/L (ref 136–145)

## 2018-01-02 LAB — LIPID PANEL
CHOL/HDL Ratio: 3.3 (ref 0–5.0)
Chol/HDL Ratio: 3.3 (ref 0–5.0)
Cholesterol, Total: 226 MG/DL — ABNORMAL HIGH (ref <200)
Cholesterol, total: 226 MG/DL — ABNORMAL HIGH (ref <200)
HDL Cholesterol: 69 MG/DL — ABNORMAL HIGH (ref 40–60)
HDL: 69 MG/DL — ABNORMAL HIGH (ref 40–60)
LDL Calculated: 136 MG/DL — ABNORMAL HIGH (ref 0–100)
LDL, calculated: 136 MG/DL — ABNORMAL HIGH (ref 0–100)
Triglyceride: 105 MG/DL (ref <150)
Triglycerides: 105 MG/DL (ref <150)
VLDL Cholesterol Calculated: 21 MG/DL
VLDL, calculated: 21 MG/DL

## 2018-01-02 LAB — VITAMIN D, 25 HYDROXY: Vitamin D 25-Hydroxy: 27.2 ng/mL — ABNORMAL LOW (ref 30–100)

## 2018-01-02 LAB — TSH 3RD GENERATION
TSH: 4.59 u[IU]/mL — ABNORMAL HIGH (ref 0.36–3.74)
TSH: 4.59 u[IU]/mL — ABNORMAL HIGH (ref 0.36–3.74)

## 2018-01-02 LAB — CBC WITH AUTO DIFFERENTIAL
Basophils %: 1 % (ref 0–2)
Basophils Absolute: 0 10*3/uL (ref 0.0–0.1)
Eosinophils %: 6 % — ABNORMAL HIGH (ref 0–5)
Eosinophils Absolute: 0.3 10*3/uL (ref 0.0–0.4)
Hematocrit: 43.9 % (ref 35.0–45.0)
Hemoglobin: 14.8 g/dL (ref 12.0–16.0)
Lymphocytes %: 31 % (ref 21–52)
Lymphocytes Absolute: 1.4 10*3/uL (ref 0.9–3.6)
MCH: 29.8 PG (ref 24.0–34.0)
MCHC: 33.7 g/dL (ref 31.0–37.0)
MCV: 88.5 FL (ref 74.0–97.0)
MPV: 9.6 FL (ref 9.2–11.8)
Monocytes %: 7 % (ref 3–10)
Monocytes Absolute: 0.3 10*3/uL (ref 0.05–1.2)
Neutrophils %: 55 % (ref 40–73)
Neutrophils Absolute: 2.5 10*3/uL (ref 1.8–8.0)
Platelets: 204 10*3/uL (ref 135–420)
RBC: 4.96 M/uL (ref 4.20–5.30)
RDW: 13 % (ref 11.6–14.5)
WBC: 4.6 10*3/uL (ref 4.6–13.2)

## 2018-01-02 LAB — COMPREHENSIVE METABOLIC PANEL
ALT: 31 U/L (ref 13–56)
AST: 28 U/L (ref 10–38)
Albumin/Globulin Ratio: 1.3 (ref 0.8–1.7)
Albumin: 4.1 g/dL (ref 3.4–5.0)
Alkaline Phosphatase: 101 U/L (ref 45–117)
Anion Gap: 6 mmol/L (ref 3.0–18)
BUN: 20 MG/DL — ABNORMAL HIGH (ref 7.0–18)
Bun/Cre Ratio: 20 (ref 12–20)
CO2: 33 mmol/L — ABNORMAL HIGH (ref 21–32)
Calcium: 9.6 MG/DL (ref 8.5–10.1)
Chloride: 103 mmol/L (ref 100–111)
Creatinine: 1.02 MG/DL (ref 0.6–1.3)
EGFR IF NonAfrican American: 53 mL/min/{1.73_m2} — ABNORMAL LOW (ref >60)
GFR African American: >60 mL/min/{1.73_m2} (ref >60)
Globulin: 3.2 g/dL (ref 2.0–4.0)
Glucose: 94 mg/dL (ref 74–99)
Potassium: 3.9 mmol/L (ref 3.5–5.5)
Sodium: 142 mmol/L (ref 136–145)
Total Bilirubin: 0.6 MG/DL (ref 0.2–1.0)
Total Protein: 7.3 g/dL (ref 6.4–8.2)

## 2018-01-02 LAB — VITAMIN D 25 HYDROXY: Vit D, 25-Hydroxy: 27.2 ng/mL — ABNORMAL LOW (ref 30–100)

## 2018-02-06 ENCOUNTER — Inpatient Hospital Stay: Admit: 2018-02-06 | Payer: MEDICARE | Primary: Physician Assistant

## 2018-02-06 DIAGNOSIS — C4492 Squamous cell carcinoma of skin, unspecified: Secondary | ICD-10-CM

## 2018-02-20 ENCOUNTER — Encounter (INDEPENDENT_AMBULATORY_CARE_PROVIDER_SITE_OTHER): Payer: Self-pay | Admitting: Internal Medicine

## 2018-02-20 ENCOUNTER — Ambulatory Visit (INDEPENDENT_AMBULATORY_CARE_PROVIDER_SITE_OTHER): Payer: Medicare Other | Admitting: Internal Medicine

## 2018-02-20 VITALS — BP 118/80 | HR 72 | Temp 98.6°F | Ht 62.0 in | Wt 136.6 lb

## 2018-02-20 DIAGNOSIS — I2510 Atherosclerotic heart disease of native coronary artery without angina pectoris: Secondary | ICD-10-CM | POA: Diagnosis not present

## 2018-02-20 DIAGNOSIS — R1013 Epigastric pain: Secondary | ICD-10-CM

## 2018-02-20 DIAGNOSIS — K5900 Constipation, unspecified: Secondary | ICD-10-CM

## 2018-02-20 DIAGNOSIS — R490 Dysphonia: Secondary | ICD-10-CM

## 2018-02-20 MED ORDER — SUCRALFATE 1 G PO TABS: 1.0000 g | ORAL_TABLET | Freq: Two times a day (BID) | ORAL | 2 refills | Status: DC | PRN

## 2018-02-20 MED ORDER — DEXLANSOPRAZOLE 60 MG PO CPDR: 60.0000 mg | DELAYED_RELEASE_CAPSULE | Freq: Every day | ORAL | 2 refills | Status: DC

## 2018-02-20 NOTE — Progress Notes (Signed)
Presenting complaint;  Follow-up for epigastric pain and constipation.  Patient complains of persistent hoarseness.  Subjective:  Patient is 72 year old Caucasian female who is here for scheduled visit.  She was last seen in July last year.  She had a EGD back in December 2017 and her esophagus was normal.  She had few benign-appearing gastric polyps.  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  These were not biopsied again.  She was treated with sucralfate with resolution of her pain.  She also has a history of constipation.  Patient states she is doing well as far as her epigastric pain is concerned.  She takes sucralfate once or twice daily.  She is on high-fiber diet and take stool softener her bowels move daily.  However she continues to complain of hoarseness.  She did not respond to PPI therapy in the past.  She rarely has heartburn.  She has difficulty with pills and with pancakes but not with other foods.  She denies sore throat or chronic cough.  She also denies melena or rectal bleeding.  She is not taking anticoagulant anymore.  It is still in the list of her medications. She had ablation for atrial fibrillation in February 2019 and has remained in sinus rhythm.    Current Medications: Outpatient Encounter Medications as of 02/20/2018  Medication Sig  . allopurinol (ZYLOPRIM) 100 MG tablet Take 100 mg by mouth daily.  Marland Kitchen ALPRAZolam (XANAX) 1 MG tablet TAKE ONE TABLET BY MOUTH AT BEDTIME AS NEEDED.  Marland Kitchen aspirin EC 81 MG tablet Take 81 mg by mouth daily.  Mariane Baumgarten Sodium (PHILLIPS STOOL SOFTENER PO) Take 2 capsules by mouth at bedtime.   . finasteride (PROSCAR) 5 MG tablet Take 2.5 mg by mouth daily.   . minoxidil (ROGAINE) 2 % external solution Apply 1 application topically daily.   Marland Kitchen OVER THE COUNTER MEDICATION Take 1 tablet by mouth at bedtime. Sennosides Stimulant Laxative  . pravastatin (PRAVACHOL) 80 MG tablet TAKE ONE (1) TABLET BY MOUTH EVERY DAY  .  sertraline (ZOLOFT) 50 MG tablet Take 1 tablet (50 mg total) by mouth daily.  . sucralfate (CARAFATE) 1 g tablet TAKE ONE TABLET BY MOUTH TWICE DAILY AS NEEDED (Patient taking differently: TAKE 1 G BY MOUTH TWICE DAILY AS NEEDED FOR STOMACH)  . torsemide (DEMADEX) 20 MG tablet TAKE ONE TABLET (20MG  TOTAL) BY MOUTH EVERY MORNING  . [DISCONTINUED] apixaban (ELIQUIS) 5 MG TABS tablet Take 1 tablet (5 mg total) by mouth 2 (two) times daily.  . [DISCONTINUED] metoprolol succinate (TOPROL XL) 25 MG 24 hr tablet Take 1/2 tablet daily x 2 weeks and then stop  . conjugated estrogens (PREMARIN) vaginal cream Place 1 Applicatorful vaginally every other day.  . potassium chloride (K-DUR) 10 MEQ tablet Take 2 tablets (20 mEq total) by mouth daily.  . [DISCONTINUED] allopurinol (ZYLOPRIM) 100 MG tablet TAKE TWO (2) TABLETS BY MOUTH DAILY  . [DISCONTINUED] estradiol (ESTRACE) 1 MG tablet Take 1 mg by mouth daily.  . [DISCONTINUED] pantoprazole (PROTONIX) 40 MG tablet Take 1 tablet (40 mg total) by mouth 2 (two) times daily before a meal.  . [DISCONTINUED] sertraline (ZOLOFT) 50 MG tablet TAKE ONE TABLET (50MG  TOTAL) BY MOUTH DAILY   No facility-administered encounter medications on file as of 02/20/2018.      Objective: Blood pressure 118/80, pulse 72, temperature 98.6 F (37 C), height 5\' 2"  (1.575 m), weight 136 lb 9.6 oz (62 kg). Patient has is alert and in  no acute distress. She is hoarse. Conjunctiva is pink. Sclera is nonicteric Oropharyngeal mucosa is normal. No neck masses or thyromegaly noted. Cardiac exam with regular rhythm normal S1 and S2. No murmur or gallop noted. Lungs are clear to auscultation. Abdomen is symmetrical soft and nontender with organomegaly or masses. No LE edema or clubbing noted.   Assessment:  #1.  Chronic epigastric pain responding to sucralfate.  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  She will continue use sucralfate at a low dose of 1 to 2 g  daily.  #2.  Chronic hoarseness.  She did not respond to PPI therapy in the past.  She does not have typical symptoms of GERD.  Will treat treat her with another PPI.  #3.  Chronic constipation.  She is doing well with dietary measures and stool softener.  She is up-to-date on screening for CRC.  Last exam was in March 2014 with removal of 3 small polyps and 2 were tubular adenomas.  She will be due for next colonoscopy in August 23, 2012.   Plan:  New prescription given for sucralfate 1 g p.o. twice daily. Dexlansoprazole 60 mg p.o. every morning.  2 weeks samples given along with a prescription.  Hopefully it will be covered by her insurance. Patient will call with progress report in 8 weeks regarding her hoarseness. Office visit in 1 year.

## 2018-02-20 NOTE — Patient Instructions (Signed)
Please call office with progress report after you have been on dexlansoprazole for 8 weeks.

## 2018-03-01 ENCOUNTER — Encounter: Payer: Self-pay | Admitting: Family Medicine

## 2018-03-01 ENCOUNTER — Ambulatory Visit (INDEPENDENT_AMBULATORY_CARE_PROVIDER_SITE_OTHER): Payer: Medicare Other | Admitting: Family Medicine

## 2018-03-01 VITALS — BP 122/70 | Temp 101.9°F | Ht 62.0 in | Wt 138.2 lb

## 2018-03-01 DIAGNOSIS — I2510 Atherosclerotic heart disease of native coronary artery without angina pectoris: Secondary | ICD-10-CM | POA: Diagnosis not present

## 2018-03-01 DIAGNOSIS — J310 Chronic rhinitis: Secondary | ICD-10-CM

## 2018-03-01 DIAGNOSIS — J329 Chronic sinusitis, unspecified: Secondary | ICD-10-CM

## 2018-03-01 MED ORDER — CEFDINIR 300 MG PO CAPS: 300.0000 mg | ORAL_CAPSULE | Freq: Two times a day (BID) | ORAL | 0 refills | Status: DC

## 2018-03-01 NOTE — Progress Notes (Signed)
   Subjective:    Patient ID: Terri Douglas, female    DOB: 12-24-1945, 72 y.o.   MRN: [id number]  Sinusitis  This is a new problem. Episode onset: 3 days. The maximum temperature recorded prior to her arrival was 101 - 101.9 F. Associated symptoms include coughing, diaphoresis, ear pain, headaches and a sore throat. Treatments tried: gargle with salt water, aspirin, cough syrup.   Ears aching  Frontal heaache/worse with cough also notes left anterior chest wall pain.  Sharp in nature transient also more painful with cough  Lots of dranage    Used baby aspirin cough ok but not bad       Review of Systems  Constitutional: Positive for diaphoresis.  HENT: Positive for ear pain and sore throat.   Respiratory: Positive for cough.   Neurological: Positive for headaches.       Objective:   Physical Exam    Alert, mild malaise. Hydration good Vitals stable. frontal/ maxillary tenderness evident positive nasal congestion. pharynx normal neck supple  lungs clear/no crackles or wheezes. heart regular in rhythm Left costal margin tender    Assessment & Plan:  Impression rhinosinusitis likely post viral, discussed with patient. plan antibiotics prescribed. Questions answered. Symptomatic care discussed. warning signs discussed. WSL

## 2018-03-01 NOTE — Patient Instructions (Signed)
Two to three 200 mg otc ibuprofen evy sox hrs,  400 to 600 mg

## 2018-03-02 ENCOUNTER — Telehealth: Payer: Self-pay | Admitting: *Deleted

## 2018-03-02 NOTE — Telephone Encounter (Signed)
A prior authorization request has been placed through [email or web address] for patients Dexilant 60 mg daily. Key: [id number].   Previously tried and failed: pantoprazole Dx: GERD (K21.9), Hoarseness (R49.0)  We are currently awaiting insurance response. They have 72 business hours to give approval/denial of medication.

## 2018-03-05 ENCOUNTER — Encounter: Payer: Self-pay | Admitting: Family Medicine

## 2018-03-05 ENCOUNTER — Ambulatory Visit (INDEPENDENT_AMBULATORY_CARE_PROVIDER_SITE_OTHER): Payer: Medicare Other | Admitting: Family Medicine

## 2018-03-05 VITALS — BP 138/84 | Temp 98.5°F | Ht 62.0 in | Wt 137.0 lb

## 2018-03-05 DIAGNOSIS — I2510 Atherosclerotic heart disease of native coronary artery without angina pectoris: Secondary | ICD-10-CM | POA: Diagnosis not present

## 2018-03-05 DIAGNOSIS — J019 Acute sinusitis, unspecified: Secondary | ICD-10-CM

## 2018-03-05 DIAGNOSIS — B9689 Other specified bacterial agents as the cause of diseases classified elsewhere: Secondary | ICD-10-CM

## 2018-03-05 DIAGNOSIS — B348 Other viral infections of unspecified site: Secondary | ICD-10-CM | POA: Diagnosis not present

## 2018-03-05 MED ORDER — AMOXICILLIN-POT CLAVULANATE 875-125 MG PO TABS: 1.0000 | ORAL_TABLET | Freq: Two times a day (BID) | ORAL | 0 refills | Status: DC

## 2018-03-05 MED ORDER — CEFTRIAXONE SODIUM 500 MG IJ SOLR
500.0000 mg | Freq: Once | INTRAMUSCULAR | Status: AC
  Administered 2018-03-05: 500 mg via INTRAMUSCULAR

## 2018-03-05 NOTE — Progress Notes (Signed)
   Subjective:    Patient ID: Terri Douglas, female    DOB: May 24, 1946, 72 y.o.   MRN: [id number]  Cough  This is a recurrent problem. The current episode started in the past 7 days. The cough is non-productive. Associated symptoms include chills, ear pain, a fever, headaches, rhinorrhea and a sore throat. Pertinent negatives include no chest pain, shortness of breath or wheezing. Associated symptoms comments: Neck pain, light headedness. Treatments tried: was here last Thursday and was given Cefdinir  The treatment provided no relief.   Febrile illness over the weekend body aches headache not feeling good sinus pressure congestion deep cough denies wheezing difficulty breathing   Review of Systems  Constitutional: Positive for chills and fever. Negative for activity change.  HENT: Positive for congestion, ear pain, rhinorrhea and sore throat.   Eyes: Negative for discharge.  Respiratory: Positive for cough. Negative for shortness of breath and wheezing.   Cardiovascular: Negative for chest pain.  Neurological: Positive for headaches.       Objective:   Physical Exam  Constitutional: She appears well-developed.  HENT:  Head: Normocephalic.  Nose: Nose normal.  Mouth/Throat: Oropharynx is clear and moist. No oropharyngeal exudate.  Neck: Neck supple.  Cardiovascular: Normal rate and normal heart sounds.  No murmur heard. Pulmonary/Chest: Effort normal and breath sounds normal. She has no wheezes.  Lymphadenopathy:    She has no cervical adenopathy.  Skin: Skin is warm and dry.  Nursing note and vitals reviewed.   Currently I do not feel the patient needs x-rays or lab work      Assessment & Plan:  Viral syndrome Probable parainfluenza Secondary sinusitis Shot of Rocephin along with change of antibiotics Warning signs discussed Follow-up if progressive troubles or worse

## 2018-03-05 NOTE — Patient Instructions (Signed)
This is a serious viral illness which will zap your energy for the next 7 to 14 days If worse please call We are changing your antibiotic to help fight the sinus infection as well.

## 2018-03-06 ENCOUNTER — Other Ambulatory Visit: Payer: Self-pay | Admitting: Family Medicine

## 2018-03-06 ENCOUNTER — Other Ambulatory Visit: Payer: Self-pay | Admitting: Internal Medicine

## 2018-03-06 NOTE — Telephone Encounter (Signed)
May do this refill +3 additional refills

## 2018-03-26 ENCOUNTER — Encounter

## 2018-03-26 ENCOUNTER — Encounter: Payer: Self-pay | Admitting: Family Medicine

## 2018-03-26 ENCOUNTER — Ambulatory Visit (INDEPENDENT_AMBULATORY_CARE_PROVIDER_SITE_OTHER): Payer: Medicare Other | Admitting: Family Medicine

## 2018-03-26 VITALS — BP 118/76 | Temp 98.9°F | Ht 62.0 in | Wt 139.2 lb

## 2018-03-26 DIAGNOSIS — J019 Acute sinusitis, unspecified: Secondary | ICD-10-CM | POA: Diagnosis not present

## 2018-03-26 DIAGNOSIS — B9689 Other specified bacterial agents as the cause of diseases classified elsewhere: Secondary | ICD-10-CM | POA: Diagnosis not present

## 2018-03-26 DIAGNOSIS — I2510 Atherosclerotic heart disease of native coronary artery without angina pectoris: Secondary | ICD-10-CM | POA: Diagnosis not present

## 2018-03-26 DIAGNOSIS — R5383 Other fatigue: Secondary | ICD-10-CM

## 2018-03-26 MED ORDER — DOXYCYCLINE HYCLATE 100 MG PO TABS: 100.0000 mg | ORAL_TABLET | Freq: Two times a day (BID) | ORAL | 0 refills | Status: DC

## 2018-03-26 NOTE — Progress Notes (Signed)
   Subjective:    Patient ID: Terri Douglas, female    DOB: [birth year], 72 y.o.   MRN: [id number]  Sinus Problem  This is a new problem. The current episode started 1 to 4 weeks ago. Associated symptoms include coughing, headaches, a hoarse voice and sinus pressure. Pertinent negatives include no congestion, ear pain or shortness of breath. Treatments tried: augmentin, omnicef and shot of Rocephin.   Patient states she is not feeling any better despite shot of Rocephin, Augmentin and Omnicef Patient frustrated by how long it is taking to get better Relates a lot of head congestion drainage coughing Denies shortness of breath denies high fever chills sweats Does relate she feels very weak  Review of Systems  Constitutional: Negative for activity change and fever.  HENT: Positive for hoarse voice, rhinorrhea and sinus pressure. Negative for congestion and ear pain.   Eyes: Negative for discharge.  Respiratory: Positive for cough. Negative for shortness of breath and wheezing.   Cardiovascular: Negative for chest pain.  Neurological: Positive for headaches.       Objective:   Physical Exam  Constitutional: She appears well-developed.  HENT:  Head: Normocephalic.  Nose: Nose normal.  Mouth/Throat: Oropharynx is clear and moist. No oropharyngeal exudate.  Neck: Neck supple.  Cardiovascular: Normal rate and normal heart sounds.  No murmur heard. Pulmonary/Chest: Effort normal and breath sounds normal. She has no wheezes.  Lymphadenopathy:    She has no cervical adenopathy.  Skin: Skin is warm and dry.  Nursing note and vitals reviewed.         Assessment & Plan:  Recent flulike illness Secondary sinus Been treated with 2 rounds of antibiotics Recommend doxycycline twice daily for the next 10 days Lab work ordered and await the results No shot or chest x-ray necessary currently Patient encouraged to increase activity gradually

## 2018-03-27 LAB — CBC WITH DIFFERENTIAL/PLATELET
Basophils Absolute: 0 10*3/uL (ref 0.0–0.2)
Basos: 1 %
EOS (ABSOLUTE): 0.2 10*3/uL (ref 0.0–0.4)
Eos: 5 %
Hematocrit: 42.6 % (ref 34.0–46.6)
Hemoglobin: 13.7 g/dL (ref 11.1–15.9)
Immature Grans (Abs): 0 10*3/uL (ref 0.0–0.1)
Immature Granulocytes: 0 %
Lymphocytes Absolute: 1.1 10*3/uL (ref 0.7–3.1)
Lymphs: 22 %
MCH: 28.8 pg (ref 26.6–33.0)
MCHC: 32.2 g/dL (ref 31.5–35.7)
MCV: 90 fL (ref 79–97)
Monocytes Absolute: 0.5 10*3/uL (ref 0.1–0.9)
Monocytes: 11 %
Neutrophils Absolute: 3 10*3/uL (ref 1.4–7.0)
Neutrophils: 61 %
Platelets: 389 10*3/uL (ref 150–450)
RBC: 4.75 x10E6/uL (ref 3.77–5.28)
RDW: 12.7 % (ref 12.3–15.4)
WBC: 4.9 10*3/uL (ref 3.4–10.8)

## 2018-03-27 LAB — HEPATIC FUNCTION PANEL
ALT: 24 IU/L (ref 0–32)
AST: 25 IU/L (ref 0–40)
Albumin: 4.4 g/dL (ref 3.5–4.8)
Alkaline Phosphatase: 86 IU/L (ref 39–117)
Bilirubin Total: 0.3 mg/dL (ref 0.0–1.2)
Bilirubin, Direct: 0.06 mg/dL (ref 0.00–0.40)
Total Protein: 6.6 g/dL (ref 6.0–8.5)

## 2018-03-27 LAB — BASIC METABOLIC PANEL
BUN/Creatinine Ratio: 14 (ref 12–28)
BUN: 14 mg/dL (ref 8–27)
CO2: 27 mmol/L (ref 20–29)
Calcium: 9.7 mg/dL (ref 8.7–10.3)
Chloride: 99 mmol/L (ref 96–106)
Creatinine, Ser: 1.03 mg/dL — ABNORMAL HIGH (ref 0.57–1.00)
GFR calc Af Amer: 63 mL/min/{1.73_m2} (ref >59)
GFR calc non Af Amer: 55 mL/min/{1.73_m2} — ABNORMAL LOW (ref >59)
Glucose: 90 mg/dL (ref 65–99)
Potassium: 4.6 mmol/L (ref 3.5–5.2)
Sodium: 141 mmol/L (ref 134–144)

## 2018-04-05 ENCOUNTER — Inpatient Hospital Stay: Admit: 2018-04-05 | Payer: MEDICARE | Attending: Family Medicine | Primary: Physician Assistant

## 2018-04-05 DIAGNOSIS — Z1231 Encounter for screening mammogram for malignant neoplasm of breast: Secondary | ICD-10-CM

## 2018-04-07 DIAGNOSIS — Z23 Encounter for immunization: Secondary | ICD-10-CM | POA: Diagnosis not present

## 2018-04-13 ENCOUNTER — Encounter

## 2018-04-13 ENCOUNTER — Inpatient Hospital Stay: Admit: 2018-04-13 | Payer: MEDICARE | Primary: Physician Assistant

## 2018-04-13 DIAGNOSIS — E039 Hypothyroidism, unspecified: Secondary | ICD-10-CM

## 2018-04-13 LAB — CBC WITH AUTOMATED DIFF
ABS. BASOPHILS: 0 10*3/uL (ref 0.0–0.1)
ABS. EOSINOPHILS: 0.3 10*3/uL (ref 0.0–0.4)
ABS. LYMPHOCYTES: 1.5 10*3/uL (ref 0.9–3.6)
ABS. MONOCYTES: 0.5 10*3/uL (ref 0.05–1.2)
ABS. NEUTROPHILS: 3.4 10*3/uL (ref 1.8–8.0)
BASOPHILS: 1 % (ref 0–2)
EOSINOPHILS: 5 % (ref 0–5)
HCT: 42.4 % (ref 35.0–45.0)
HGB: 14.6 g/dL (ref 12.0–16.0)
LYMPHOCYTES: 26 % (ref 21–52)
MCH: 30.4 PG (ref 24.0–34.0)
MCHC: 34.4 g/dL (ref 31.0–37.0)
MCV: 88.3 FL (ref 74.0–97.0)
MONOCYTES: 9 % (ref 3–10)
MPV: 9.9 FL (ref 9.2–11.8)
NEUTROPHILS: 59 % (ref 40–73)
PLATELET: 224 10*3/uL (ref 135–420)
RBC: 4.8 M/uL (ref 4.20–5.30)
RDW: 12.8 % (ref 11.6–14.5)
WBC: 5.7 10*3/uL (ref 4.6–13.2)

## 2018-04-13 LAB — METABOLIC PANEL, COMPREHENSIVE
A-G Ratio: 1.1 (ref 0.8–1.7)
ALT (SGPT): 24 U/L (ref 13–56)
AST (SGOT): 27 U/L (ref 10–38)
Albumin: 3.8 g/dL (ref 3.4–5.0)
Alk. phosphatase: 98 U/L (ref 45–117)
Anion gap: 7 mmol/L (ref 3.0–18)
BUN/Creatinine ratio: 19 (ref 12–20)
BUN: 17 MG/DL (ref 7.0–18)
Bilirubin, total: 0.7 MG/DL (ref 0.2–1.0)
CO2: 32 mmol/L (ref 21–32)
Calcium: 9.3 MG/DL (ref 8.5–10.1)
Chloride: 99 mmol/L — ABNORMAL LOW (ref 100–111)
Creatinine: 0.88 MG/DL (ref 0.6–1.3)
GFR est AA: >60 mL/min/{1.73_m2} (ref >60)
GFR est non-AA: >60 mL/min/{1.73_m2} (ref >60)
Globulin: 3.5 g/dL (ref 2.0–4.0)
Glucose: 91 mg/dL (ref 74–99)
Potassium: 3.6 mmol/L (ref 3.5–5.5)
Protein, total: 7.3 g/dL (ref 6.4–8.2)
Sodium: 138 mmol/L (ref 136–145)

## 2018-04-13 LAB — T4, FREE
T4 Free: 1.1 NG/DL (ref 0.7–1.5)
T4, Free: 1.1 NG/DL (ref 0.7–1.5)

## 2018-04-13 LAB — LIPID PANEL
CHOL/HDL Ratio: 3.2 (ref 0–5.0)
Chol/HDL Ratio: 3.2 (ref 0–5.0)
Cholesterol, Total: 217 MG/DL — ABNORMAL HIGH (ref <200)
Cholesterol, total: 217 MG/DL — ABNORMAL HIGH (ref <200)
HDL Cholesterol: 67 MG/DL — ABNORMAL HIGH (ref 40–60)
HDL: 67 MG/DL — ABNORMAL HIGH (ref 40–60)
LDL Calculated: 131.2 MG/DL — ABNORMAL HIGH (ref 0–100)
LDL, calculated: 131.2 MG/DL — ABNORMAL HIGH (ref 0–100)
Triglyceride: 94 MG/DL (ref <150)
Triglycerides: 94 MG/DL (ref <150)
VLDL Cholesterol Calculated: 18.8 MG/DL
VLDL, calculated: 18.8 MG/DL

## 2018-04-13 LAB — TSH 3RD GENERATION
TSH: 2.07 u[IU]/mL (ref 0.36–3.74)
TSH: 2.07 u[IU]/mL (ref 0.36–3.74)

## 2018-04-13 LAB — COMPREHENSIVE METABOLIC PANEL
ALT: 24 U/L (ref 13–56)
AST: 27 U/L (ref 10–38)
Albumin/Globulin Ratio: 1.1 (ref 0.8–1.7)
Albumin: 3.8 g/dL (ref 3.4–5.0)
Alkaline Phosphatase: 98 U/L (ref 45–117)
Anion Gap: 7 mmol/L (ref 3.0–18)
BUN: 17 MG/DL (ref 7.0–18)
Bun/Cre Ratio: 19 (ref 12–20)
CO2: 32 mmol/L (ref 21–32)
Calcium: 9.3 MG/DL (ref 8.5–10.1)
Chloride: 99 mmol/L — ABNORMAL LOW (ref 100–111)
Creatinine: 0.88 MG/DL (ref 0.6–1.3)
EGFR IF NonAfrican American: >60 mL/min/{1.73_m2} (ref >60)
GFR African American: >60 mL/min/{1.73_m2} (ref >60)
Globulin: 3.5 g/dL (ref 2.0–4.0)
Glucose: 91 mg/dL (ref 74–99)
Potassium: 3.6 mmol/L (ref 3.5–5.5)
Sodium: 138 mmol/L (ref 136–145)
Total Bilirubin: 0.7 MG/DL (ref 0.2–1.0)
Total Protein: 7.3 g/dL (ref 6.4–8.2)

## 2018-04-13 LAB — CBC WITH AUTO DIFFERENTIAL
Basophils %: 1 % (ref 0–2)
Basophils Absolute: 0 10*3/uL (ref 0.0–0.1)
Eosinophils %: 5 % (ref 0–5)
Eosinophils Absolute: 0.3 10*3/uL (ref 0.0–0.4)
Hematocrit: 42.4 % (ref 35.0–45.0)
Hemoglobin: 14.6 g/dL (ref 12.0–16.0)
Lymphocytes %: 26 % (ref 21–52)
Lymphocytes Absolute: 1.5 10*3/uL (ref 0.9–3.6)
MCH: 30.4 PG (ref 24.0–34.0)
MCHC: 34.4 g/dL (ref 31.0–37.0)
MCV: 88.3 FL (ref 74.0–97.0)
MPV: 9.9 FL (ref 9.2–11.8)
Monocytes %: 9 % (ref 3–10)
Monocytes Absolute: 0.5 10*3/uL (ref 0.05–1.2)
Neutrophils %: 59 % (ref 40–73)
Neutrophils Absolute: 3.4 10*3/uL (ref 1.8–8.0)
Platelets: 224 10*3/uL (ref 135–420)
RBC: 4.8 M/uL (ref 4.20–5.30)
RDW: 12.8 % (ref 11.6–14.5)
WBC: 5.7 10*3/uL (ref 4.6–13.2)

## 2018-04-14 LAB — VITAMIN D, 25 HYDROXY: Vitamin D 25-Hydroxy: 24.4 ng/mL — ABNORMAL LOW (ref 30–100)

## 2018-04-14 LAB — VITAMIN D 25 HYDROXY: Vit D, 25-Hydroxy: 24.4 ng/mL — ABNORMAL LOW (ref 30–100)

## 2018-04-16 ENCOUNTER — Encounter (HOSPITAL_COMMUNITY): Payer: Self-pay | Admitting: Emergency Medicine

## 2018-04-16 ENCOUNTER — Other Ambulatory Visit: Payer: Self-pay

## 2018-04-16 ENCOUNTER — Emergency Department (HOSPITAL_COMMUNITY)
Admission: EM | Admit: 2018-04-16 | Discharge: 2018-04-17 | Disposition: A | Discharge status: Home / Self Care | Payer: Medicare Other | Attending: Emergency Medicine | Admitting: Emergency Medicine

## 2018-04-16 DIAGNOSIS — L299 Pruritus, unspecified: Secondary | ICD-10-CM | POA: Diagnosis not present

## 2018-04-16 DIAGNOSIS — I10 Essential (primary) hypertension: Secondary | ICD-10-CM | POA: Insufficient documentation

## 2018-04-16 DIAGNOSIS — Z7902 Long term (current) use of antithrombotics/antiplatelets: Secondary | ICD-10-CM | POA: Diagnosis not present

## 2018-04-16 DIAGNOSIS — Z7982 Long term (current) use of aspirin: Secondary | ICD-10-CM | POA: Insufficient documentation

## 2018-04-16 DIAGNOSIS — Z79899 Other long term (current) drug therapy: Secondary | ICD-10-CM | POA: Diagnosis not present

## 2018-04-16 DIAGNOSIS — L509 Urticaria, unspecified: Secondary | ICD-10-CM | POA: Diagnosis not present

## 2018-04-16 MED ORDER — FAMOTIDINE 20 MG PO TABS
20.0000 mg | ORAL_TABLET | Freq: Once | ORAL | Status: AC
  Administered 2018-04-16: 20 mg via ORAL
  Filled 2018-04-16: qty 1

## 2018-04-16 MED ORDER — DIPHENHYDRAMINE HCL 25 MG PO CAPS
25.0000 mg | ORAL_CAPSULE | Freq: Once | ORAL | Status: AC
  Administered 2018-04-16: 25 mg via ORAL
  Filled 2018-04-16: qty 1

## 2018-04-16 NOTE — ED Triage Notes (Signed)
Pt having hives all over her body for 1 hour. Denies new soap, laundry detergents, medications, or foods.

## 2018-04-16 NOTE — ED Provider Notes (Signed)
[facility] EMERGENCY DEPARTMENT Provider Note   CSN: [id number] Arrival date & time: 04/16/18  2233     History   Chief Complaint Chief Complaint  Patient presents with  . Allergic Reaction    HPI Terri Douglas is a 72 y.o. female.  HPI   She presents for evaluation of "hives," present for 1 hour, with generalized itching.  No known new exposures to cause it.  She did not take any oral medication but tried rubbing hydrocortisone cream on her hands, with partial relief.  She denies shortness of breath, chest pain, weakness or dizziness.  There are no other known modifying factors.  Past Medical History:  Diagnosis Date  . Arthritis   . Gall stones   . GERD (gastroesophageal reflux disease)   . History of cardiac catheterization 2005   Minor elevation in cardiac enzymes however no significant obstructive CAD  . Hyperlipidemia   . Hypertension   . Insomnia   . Prediabetes     Patient Active Problem List   Diagnosis Date Noted  . Abdominal pain, epigastric 05/09/2016  . Belching 05/09/2016  . Hyperlipidemia 06/18/2014  . Hyperglycemia 06/18/2014  . Gout 06/18/2014  . Osteopenia 10/28/2013  . Right foot pain 01/31/2013  . Hypertension 03/29/2011  . High cholesterol 03/29/2011    Past Surgical History:  Procedure Laterality Date  . A-FLUTTER ABLATION N/A 08/01/2017   Procedure: A-FLUTTER ABLATION;  Surgeon: Thompson Grayer, MD;  Location: [place] [facility];  Service: Cardiovascular;  Laterality: N/A;  . ABDOMINAL HYSTERECTOMY    . BACK SURGERY  1992  . CARDIAC CATHETERIZATION     BACK IN 2004  SHE THINKS IT CAME BACK 'NORMAL'  . CHOLECYSTECTOMY N/A 06/24/2015   Procedure: LAPAROSCOPIC CHOLECYSTECTOMY;  Surgeon: Mickeal Skinner, MD;  Location: [facility];  Service: General;  Laterality: N/A;  . COLONOSCOPY N/A 08/30/2012   Procedure: COLONOSCOPY;  Surgeon: Rogene Houston, MD;  [facility];  Service: Endoscopy;  Laterality: N/A;  830  . DILATION AND  CURETTAGE OF UTERUS    . ESOPHAGOGASTRODUODENOSCOPY  04/15/2011   Procedure: ESOPHAGOGASTRODUODENOSCOPY (EGD);  Surgeon: Rogene Houston, MD;  [facility];  Service: Endoscopy;  Laterality: N/A;  11:30  . ESOPHAGOGASTRODUODENOSCOPY N/A 05/27/2016   Procedure: ESOPHAGOGASTRODUODENOSCOPY (EGD);  Surgeon: Rogene Houston, MD;  Location[facility];  Service: Endoscopy;  Laterality: N/A;  11:15  . EYE SURGERY     cataract removal  . NECK SURGERY  x3  . VAGINAL HYSTERECTOMY       OB History   None      Home Medications    Prior to Admission medications   Medication Sig Start Date End Date Taking? Authorizing Provider  allopurinol (ZYLOPRIM) 100 MG tablet Take 100 mg by mouth daily.    [provider]  ALPRAZolam Duanne Moron) 1 MG tablet TAKE ONE TABLET BY MOUTH AT BEDTIME AS NEEDED 03/07/18   Kathyrn Drown, MD  aspirin EC 81 MG tablet Take 81 mg by mouth daily.    [provider]  conjugated estrogens (PREMARIN) vaginal cream Place 1 Applicatorful vaginally every other day.    [provider]  dexlansoprazole (DEXILANT) 60 MG capsule Take 1 capsule (60 mg total) by mouth daily. 02/20/18   Rehman, Mechele Dawley, MD  Docusate Sodium (PHILLIPS STOOL SOFTENER PO) Take 2 capsules by mouth at bedtime.     [provider]  doxycycline (VIBRA-TABS) 100 MG tablet Take 1 tablet (100 mg total)  by mouth 2 (two) times daily. 03/26/18   Kathyrn Drown, MD  finasteride (PROSCAR) 5 MG tablet Take 2.5 mg by mouth daily.  08/27/15   [provider]  minoxidil (ROGAINE) 2 % external solution Apply 1 application topically daily.     [provider]  OVER THE COUNTER MEDICATION Take 1 tablet by mouth daily as needed. Sennosides Stimulant Laxative    [provider]  potassium chloride (K-DUR) 10 MEQ tablet TAKE 2 TABLET (20MEQ TOTAL) BY MOUTH DAILY 03/06/18   Allred, Jeneen Rinks, MD  pravastatin (PRAVACHOL) 80 MG tablet TAKE ONE (1) TABLET BY MOUTH  EVERY DAY 12/04/17   Kathyrn Drown, MD  sertraline (ZOLOFT) 50 MG tablet Take 1 tablet (50 mg total) by mouth daily. 05/29/17   Kathyrn Drown, MD  sucralfate (CARAFATE) 1 g tablet Take 1 tablet (1 g total) by mouth 2 (two) times daily as needed. 02/20/18   Rogene Houston, MD  torsemide (DEMADEX) 20 MG tablet TAKE ONE TABLET (20MG  TOTAL) BY MOUTH EVERY MORNING 03/06/18   Kathyrn Drown, MD    Family History Family History  Problem Relation Age of Onset  . Bone cancer Mother   . Prostate cancer Father   . Anesthesia problems Neg Hx   . Hypotension Neg Hx   . Malignant hyperthermia Neg Hx   . Pseudochol deficiency Neg Hx     Social History Social History   Tobacco Use  . Smoking status: Never Smoker  . Smokeless tobacco: Never Used  Substance Use Topics  . Alcohol use: No  . Drug use: No     Allergies   Phenobarbital   Review of Systems Review of Systems  All other systems reviewed and are negative.    Physical Exam Updated Vital Signs BP (!) 162/77 (BP Location: Left Arm)   Pulse (!) 57   Temp 98.1 F (36.7 C) (Temporal)   Resp 16   Ht 5\' 2"  (1.575 m)   Wt 61.2 kg   SpO2 95%   BMI 24.69 kg/m   Physical Exam  Constitutional: She is oriented to person, place, and time. She appears well-developed and well-nourished.  HENT:  Head: Normocephalic and atraumatic.  Right Ear: External ear normal.  Left Ear: External ear normal.  No oral angioedema, tongue or lip swelling.  Eyes: Pupils are equal, round, and reactive to light. Conjunctivae and EOM are normal.  Neck: Normal range of motion and phonation normal. Neck supple.  Cardiovascular: Normal rate, regular rhythm and normal heart sounds.  Pulmonary/Chest: Effort normal and breath sounds normal. She exhibits no bony tenderness.  Abdominal: Soft. There is no tenderness.  Musculoskeletal: Normal range of motion.  Neurological: She is alert and oriented to person, place, and time. No cranial nerve deficit or  sensory deficit. She exhibits normal muscle tone. Coordination normal.  Skin: Skin is warm, dry and intact.  Minimal urticarial reaction, upper anterior chest, right shoulder, right medial upper arm, bilateral palms.  Psychiatric: She has a normal mood and affect. Her behavior is normal. Judgment and thought content normal.  Nursing note and vitals reviewed.    ED Treatments / Results  Labs (all labs ordered are listed, but only abnormal results are displayed) Labs Reviewed - No data to display  EKG None  Radiology No results found.  Procedures Procedures (including critical care time)  Medications Ordered in ED Medications  diphenhydrAMINE (BENADRYL) capsule 25 mg (25 mg Oral Given 04/16/18 2258)  famotidine (PEPCID) tablet 20  mg (20 mg Oral Given 04/16/18 2258)     Initial Impression / Assessment and Plan / ED Course  I have reviewed the triage vital signs and the nursing notes.  Pertinent labs & imaging results that were available during my care of the patient were reviewed by me and considered in my medical decision making (see chart for details).      Patient Vitals for the past 24 hrs:  BP Temp Temp src Pulse Resp SpO2 Height Weight  04/16/18 2324 - - - (!) 57 - 95 % - -  04/16/18 2240 - - - 73 - 97 % - -  04/16/18 2236 (!) 162/77 98.1 F (36.7 C) Temporal 74 16 98 % 5\' 2"  (1.575 m) 61.2 kg    12:00 AM Reevaluation with update and discussion. After initial assessment and treatment, an updated evaluation reveals no additional complaints, she states her itching has improved.  Findings discussed and questions answered. Daleen Bo   Medical Decision Making: Urticaria, cause not clear.  Doubt significant internal allergic reaction.  Doubt impending vascular collapse.  Doubt anaphylaxis.  CRITICAL CARE-no Performed by: Daleen Bo  Nursing Notes Reviewed/ Care Coordinated Applicable Imaging Reviewed Interpretation of Laboratory Data incorporated into ED  treatment  The patient appears reasonably screened and/or stabilized for discharge and I doubt any other medical condition or other [facility] requiring further screening, evaluation, or treatment in the ED at this time prior to discharge.  Plan: Home Medications-continue usual medicines and use OTC antihistamines, H1 and H2 for 3 days.; Home Treatments-rest, fluids; return here if the recommended treatment, does not improve the symptoms; Recommended follow up-PCP as needed.    Final Clinical Impressions(s) / ED Diagnoses   Final diagnoses:  [place]    ED Discharge Orders    None       Daleen Bo, MD 04/17/18 0010

## 2018-04-17 ENCOUNTER — Other Ambulatory Visit: Payer: Self-pay | Admitting: Family Medicine

## 2018-04-17 ENCOUNTER — Encounter: Payer: Self-pay | Admitting: Family Medicine

## 2018-04-17 ENCOUNTER — Ambulatory Visit (INDEPENDENT_AMBULATORY_CARE_PROVIDER_SITE_OTHER): Payer: Medicare Other | Admitting: Family Medicine

## 2018-04-17 ENCOUNTER — Other Ambulatory Visit: Payer: Self-pay | Admitting: Internal Medicine

## 2018-04-17 ENCOUNTER — Ambulatory Visit (HOSPITAL_COMMUNITY)
Admission: RE | Admit: 2018-04-17 | Discharge: 2018-04-17 | Disposition: A | Discharge status: Home / Self Care | Payer: Medicare Other | Source: Ambulatory Visit | Attending: Family Medicine | Admitting: Family Medicine

## 2018-04-17 VITALS — BP 142/78 | Temp 97.6°F | Ht 62.0 in | Wt 136.0 lb

## 2018-04-17 DIAGNOSIS — M542 Cervicalgia: Secondary | ICD-10-CM | POA: Insufficient documentation

## 2018-04-17 DIAGNOSIS — M47812 Spondylosis without myelopathy or radiculopathy, cervical region: Secondary | ICD-10-CM | POA: Insufficient documentation

## 2018-04-17 DIAGNOSIS — I2510 Atherosclerotic heart disease of native coronary artery without angina pectoris: Secondary | ICD-10-CM

## 2018-04-17 DIAGNOSIS — L509 Urticaria, unspecified: Secondary | ICD-10-CM | POA: Diagnosis not present

## 2018-04-17 MED ORDER — CHLORZOXAZONE 500 MG PO TABS: ORAL_TABLET | ORAL | 0 refills | Status: DC

## 2018-04-17 MED ORDER — NAPROXEN 500 MG PO TABS: 500.0000 mg | ORAL_TABLET | Freq: Two times a day (BID) | ORAL | 0 refills | Status: DC

## 2018-04-17 NOTE — Patient Instructions (Signed)
If hives recur very important to use Benadryl, 25 mg every 4 hours as needed, but certainly if any breathing difficulties or other serious issues would need to be seen right away ER  Please also give [place] update early next week how things are going

## 2018-04-17 NOTE — Discharge Instructions (Addendum)
To treat the hives take Benadryl 25 mg 4 times a day, and Pepcid 20 mg twice a day for at least 3 days.  After that use as needed for itching or visible rash.

## 2018-04-17 NOTE — Progress Notes (Signed)
   Subjective:    Patient ID: Terri Douglas, female    DOB: [birth year], 72 y.o.   MRN: [id number]  HPI Patient is here today with complaints of neck pain.She states she does not remember injuring. She state the pain started on Sunday. She has been using heating pad,Icy hot and ibuprofen The pain does not radiate down to her hand She denies any injury Has difficult time rotating head tilting head because of the pain  She also went to the ed for hives last night, they gave her benadryl and Pepcid and sent her home.  She denies ever having this before She did take ibuprofen yesterday but she states she is taking that for the past  Review of Systems    Currently no hives no itching she relates pain left side of the neck pain with radiation radiation into the shoulder region Pain increased with rotation and tilting Objective:   Physical Exam  Tenderness left-sided neck decreased range of motion strength of the hands wrists arms good Lungs clear respiratory rate normal heart regular no murmurs      Assessment & Plan:  Probable cervical impingement Recommend x-rays Anti-inflammatories Lives with medicine stop medicine Muscle relaxer at nighttime as needed Certainly if the medications cause any problems stop medicine Possibly may need short course of prednisone next week if ongoing May need physical therapy if ongoing No MRI indicated currently  I doubt the anti-inflammatory cause of hives but certainly if she has reoccurrence that may be a consideration

## 2018-04-18 ENCOUNTER — Telehealth: Payer: Self-pay | Admitting: *Deleted

## 2018-04-18 NOTE — Telephone Encounter (Signed)
Ok

## 2018-04-18 NOTE — Telephone Encounter (Signed)
Patient advised Dr Nicki Reaper would recommend canceling the muscle relaxer let the patient know that because of her being above age 71 Medicare and FDA does not want anyone older than 65 taken a muscle relaxer because of increased risk of accidental drowsiness and accidental falls therefore use warm compresses massages and the anti-inflammatory if not doing better by early next week let us know. Patient stated she had already picked up med and taking at bedtime- worked good and slept good. Consult with Dr Nicki Reaper- Patient may take medication and advised to watch for excessive drowsiness that may increase risk of falls. Patient verbalized understanding.

## 2018-04-18 NOTE — Telephone Encounter (Signed)
I would recommend canceling the muscle relaxer let the patient know that because of her being above age 72 Medicare and FDA does not want anyone older than 65 taken a muscle relaxer because of increased risk of accidental drowsiness and accidental falls therefore use warm compresses massages and the anti-inflammatory if not doing better by early next week let us know

## 2018-04-18 NOTE — Telephone Encounter (Signed)
Insurance called to state they denied the prior authorization request for Chlorzoxazone 500mg  due to FDA recommendations regarding muscle relaxers

## 2018-04-23 ENCOUNTER — Telehealth: Payer: Self-pay | Admitting: Family Medicine

## 2018-04-23 NOTE — Telephone Encounter (Signed)
Patient would like to give an update on how she's feeling, pt states she is feeling much better today with her neck pain than she was at her last appt 04/17/18 with Dr.Scott, she states there is a slight pain still there but manageable.

## 2018-04-23 NOTE — Telephone Encounter (Signed)
Please inform the patient I am glad things are going better so therefore it is not necessary to add any additional medicines at this point hopefully it will totally go away over the next week if any problems let us know

## 2018-04-23 NOTE — Telephone Encounter (Signed)
Patient says thanks and is aware of all.

## 2018-04-26 ENCOUNTER — Other Ambulatory Visit: Payer: Self-pay

## 2018-07-13 ENCOUNTER — Other Ambulatory Visit: Payer: Self-pay | Admitting: Family Medicine

## 2018-07-18 NOTE — Telephone Encounter (Signed)
Jonni Sanger at [facility] is checking status for pt. Pt is out of medication.

## 2018-07-19 NOTE — Telephone Encounter (Signed)
Duplicate

## 2018-07-19 NOTE — Telephone Encounter (Signed)
It is fine to give 6 refills Please make sure pharmacy let the patient know to do the best she can at avoiding taking anti-inflammatories while on this medicine because of increased risk of ulcers with anti-inflammatories with antidepressants

## 2018-08-01 DIAGNOSIS — R87612 Low grade squamous intraepithelial lesion on cytologic smear of cervix (LGSIL): Secondary | ICD-10-CM | POA: Diagnosis not present

## 2018-08-01 DIAGNOSIS — Z6825 Body mass index (BMI) 25.0-25.9, adult: Secondary | ICD-10-CM | POA: Diagnosis not present

## 2018-08-01 DIAGNOSIS — Z124 Encounter for screening for malignant neoplasm of cervix: Secondary | ICD-10-CM | POA: Diagnosis not present

## 2018-08-10 ENCOUNTER — Encounter: Payer: Self-pay | Admitting: Family Medicine

## 2018-08-10 ENCOUNTER — Ambulatory Visit (INDEPENDENT_AMBULATORY_CARE_PROVIDER_SITE_OTHER): Payer: Medicare Other | Admitting: Family Medicine

## 2018-08-10 VITALS — BP 130/80 | Temp 98.1°F | Ht 62.0 in | Wt 139.1 lb

## 2018-08-10 DIAGNOSIS — M542 Cervicalgia: Secondary | ICD-10-CM

## 2018-08-10 DIAGNOSIS — M25512 Pain in left shoulder: Secondary | ICD-10-CM

## 2018-08-10 MED ORDER — NAPROXEN 500 MG PO TABS: 500.0000 mg | ORAL_TABLET | Freq: Two times a day (BID) | ORAL | 1 refills | Status: DC

## 2018-08-10 NOTE — Progress Notes (Signed)
   Subjective:    Patient ID: Terri Douglas, female    DOB: [birth year], 73 y.o.   MRN: [id number]  HPI  Patient is here today with left sided neck pain that radiates down into the deltoid. She relates his pain is been off and on for a while back in November she had severe pain and discomfort but never really got better she relates a lot of pain down the left side of the neck into the shoulder at times it does extend down into the arm she denies any weakness numbness or tingling in the hand She did have x-rays of the neck which showed significant troubles She noticed this in January after she had received her second shingrex injection at [facility].  Review of Systems  Constitutional: Negative for activity change and appetite change.  HENT: Negative for congestion and rhinorrhea.   Respiratory: Negative for cough and shortness of breath.   Cardiovascular: Negative for chest pain and leg swelling.  Gastrointestinal: Negative for abdominal pain, nausea and vomiting.  Musculoskeletal: Positive for back pain. Negative for arthralgias.  Skin: Negative for color change.  Neurological: Negative for dizziness and weakness.  Psychiatric/Behavioral: Negative for agitation and confusion.       Objective:   Physical Exam Vitals signs reviewed.  Constitutional:      General: She is not in acute distress. HENT:     Head: Normocephalic.  Cardiovascular:     Rate and Rhythm: Normal rate and regular rhythm.     Heart sounds: Normal heart sounds. No murmur.  Pulmonary:     Effort: Pulmonary effort is normal.     Breath sounds: Normal breath sounds.  Lymphadenopathy:     Cervical: No cervical adenopathy.  Neurological:     Mental Status: She is alert.  Psychiatric:        Behavior: Behavior normal.    On shoulder examination on the left side she does have impingement sign consistent with possible rotator cuff inflammation  I do not feel that her left shoulder pain is coming from  where she had the shin Grix shot I believe this is more coincidental      Assessment & Plan:  In regards to her shoulder I recommend that she be seen by orthopedics-Dr. Aline Brochure in the near future I also recommend that this patient go ahead with trying anti-inflammatory over the next 1 to 2 weeks  As for the left side of the neck pain if this does not get better over the next 3 to 4 weeks or does not get better with her shoulder treatment with Dr. Aline Brochure she may well need an MRI of the neck she will notify us if any problems

## 2018-08-10 NOTE — Patient Instructions (Signed)
Shoulder Range of Motion Exercises  Shoulder range of motion (ROM) exercises are done to keep the shoulder moving freely or to increase movement. They are often recommended for people who have shoulder pain or stiffness or who are recovering from a shoulder surgery.  Phase 1 exercises  When you are able, do this exercise 1-2 times per day for 30-60 seconds in each direction, or as directed by your health care provider.  Pendulum exercise  To do this exercise while sitting:  1. Sit in a chair or at the edge of your bed with your feet flat on the floor.  2. Let your affected arm hang down in front of you over the edge of the bed or chair.  3. Relax your shoulder, arm, and hand.  4. Rock your body so your arm gently swings in small circles. You can also use your unaffected arm to start the motion.  5. Repeat changing the direction of the circles, swinging your arm left and right, and swinging your arm forward and back.  To do this exercise while standing:  1. Stand next to a sturdy chair or table, and hold on to it with your hand on your unaffected side.  2. Bend forward at the waist.  3. Bend your knees slightly.  4. Relax your shoulder, arm, and hand.  5. While keeping your shoulder relaxed, use body motion to swing your arm in small circles.  6. Repeat changing the direction of the circles, swinging your arm left and right, and swinging your arm forward and back.  7. Between exercises, stand up tall and take a short break to relax your lower back.    Phase 2 exercises  Do these exercises 1-2 times per day or as told by your health care provider. Hold each stretch for 30 seconds, and repeat 3 times. Do the exercises with one or both arms as instructed by your health care provider.  For these exercises, sit at a table with your hand and arm supported by the table. A chair that slides easily or has wheels can be helpful.  External rotation  1. Turn your chair so that your affected side is nearest to the  table.  2. Place your forearm on the table to your side. Bend your elbow about 90 at the elbow (right angle) and place your hand palm facing down on the table. Your elbow should be about 6 inches away from your side.  3. Keeping your arm on the table, lean your body forward.  Abduction  1. Turn your chair so that your affected side is nearest to the table.  2. Place your forearm and hand on the table so that your thumb points toward the ceiling and your arm is straight out to your side.  3. Slide your hand out to the side and away from you, using your unaffected arm to do the work.  4. To increase the stretch, you can slide your chair away from the table.  Flexion: forward stretch  1. Sit facing the table. Place your hand and elbow on the table in front of you.  2. Slide your hand forward and away from you, using your unaffected arm to do the work.  3. To increase the stretch, you can slide your chair backward.  Phase 3 exercises  Do these exercises 1-2 times per day or as told by your health care provider. Hold each stretch for 30 seconds, and repeat 3 times. Do the exercises with one or   both arms as instructed by your health care provider.  Cross-body stretch: posterior capsule stretch  1. Lift your arm straight out in front of you.  2. Bend your arm 90 at the elbow (right angle) so your forearm moves across your body.  3. Use your other arm to gently pull the elbow across your body, toward your other shoulder.  Wall climbs  1. Stand with your affected arm extended out to the side with your hand resting on a door frame.  2. Slide your hand slowly up the door frame.  3. To increase the stretch, step through the door frame. Keep your body upright and do not lean.  Wand exercises  You will need a cane, a piece of PVC pipe, or a sturdy wooden dowel for wand exercises.  Flexion  To do this exercise while standing:  1. Hold the wand with both of your hands, palms down.  2. Using the other arm to help, lift your arms  up and over your head, if able.  3. Push upward with your other arm to gently increase the stretch.  To do this exercise while lying down:  1. Lie on your back with your elbows resting on the floor and the wand in both your hands. Your hands will be palm down, or pointing toward your feet.  2. Lift your hands toward the ceiling, using your unaffected arm to help if needed.  3. Bring your arms overhead as able, using your unaffected arm to help if needed.  Internal rotation  1. Stand while holding the wand behind you with both hands. Your unaffected arm should be extended above your head with the arm of the affected side extended behind you at the level of your waist. The wand should be pointing straight up and down as you hold it.  2. Slowly pull the wand up behind your back by straightening the elbow of your unaffected arm and bending the elbow of your affected arm.  External rotation  1. Lie on your back with your affected upper arm supported on a small pillow or rolled towel. When you first do this exercise, keep your upper arm close to your body. Over time, bring your arm up to a 90 angle out to the side.  2. Hold the wand across your stomach and with both hands palm up. Your elbow on your affected side should be bent at a 90 angle.  3. Use your unaffected side to help push your forearm away from you and toward the floor. Keep your elbow on your affected side bent at a 90 angle.  Contact a health care provider if you have:   New or increasing pain.   New numbness, tingling, weakness, or discoloration in your arm or hand.  This information is not intended to replace advice given to you by your health care provider. Make sure you discuss any questions you have with your health care provider.  Document Released: 02/19/2003 Document Revised: 07/05/2017 Document Reviewed: 07/05/2017  [employer] Interactive Patient Education  2019 [employer]

## 2018-08-13 ENCOUNTER — Encounter: Payer: Self-pay | Admitting: Family Medicine

## 2018-08-14 ENCOUNTER — Encounter: Payer: Self-pay | Admitting: Orthopaedic Surgery

## 2018-08-14 ENCOUNTER — Ambulatory Visit (INDEPENDENT_AMBULATORY_CARE_PROVIDER_SITE_OTHER): Payer: Medicare Other

## 2018-08-14 ENCOUNTER — Ambulatory Visit (INDEPENDENT_AMBULATORY_CARE_PROVIDER_SITE_OTHER): Payer: Medicare Other | Admitting: Orthopaedic Surgery

## 2018-08-14 VITALS — BP 125/70 | HR 64 | Ht 62.0 in | Wt 140.0 lb

## 2018-08-14 DIAGNOSIS — M25512 Pain in left shoulder: Secondary | ICD-10-CM | POA: Diagnosis not present

## 2018-08-14 NOTE — Progress Notes (Signed)
Subjective:    Patient ID: Terri Douglas, female    DOB: November 21, 1945, 73 y.o.   MRN: [id number]  HPI She has pain of the left shoulder for several weeks.  It is getting worse. She has no trauma, no redness, no numbness.  She saw Dr. Sallee Lange on 08-10-2018 and is referred here. She has pain with overhead use and rolling over on the shoulder at night.  She has tried heat, ice, Advil. Dr. Wolfgang Phoenix began naprosyn.     Review of Systems  Constitutional: Positive for activity change.  Musculoskeletal: Positive for arthralgias.  All other systems reviewed and are negative.  For Review of Systems, all other systems reviewed and are negative.  The following is a summary of the past history medically, past history surgically, known current medicines, social history and family history.  This information is gathered electronically by the computer from prior information and documentation.  I review this each visit and have found including this information at this point in the chart is beneficial and informative.   Past Medical History:  Diagnosis Date  . Arthritis   . Gall stones   . GERD (gastroesophageal reflux disease)   . History of cardiac catheterization 2005   Minor elevation in cardiac enzymes however no significant obstructive CAD  . Hyperlipidemia   . Hypertension   . Insomnia   . Prediabetes     Past Surgical History:  Procedure Laterality Date  . A-FLUTTER ABLATION N/A 08/01/2017   Procedure: A-FLUTTER ABLATION;  Surgeon: Thompson Grayer, MD;  Location: [facility];  Service: Cardiovascular;  Laterality: N/A;  . ABDOMINAL HYSTERECTOMY    . BACK SURGERY  1992  . CARDIAC CATHETERIZATION     BACK IN 2004  SHE THINKS IT CAME BACK 'NORMAL'  . CHOLECYSTECTOMY N/A 06/24/2015   Procedure: LAPAROSCOPIC CHOLECYSTECTOMY;  Surgeon: Mickeal Skinner, MD;  Location: [facility];  Service: General;  Laterality: N/A;  . COLONOSCOPY N/A 08/30/2012   Procedure: COLONOSCOPY;  Surgeon: Rogene Houston, MD;  [facility];  Service: Endoscopy;  Laterality: N/A;  830  . DILATION AND CURETTAGE OF UTERUS    . ESOPHAGOGASTRODUODENOSCOPY  04/15/2011   Procedure: ESOPHAGOGASTRODUODENOSCOPY (EGD);  Surgeon: Rogene Houston, MD;  [facility];  Service: Endoscopy;  Laterality: N/A;  11:30  . ESOPHAGOGASTRODUODENOSCOPY N/A 05/27/2016   Procedure: ESOPHAGOGASTRODUODENOSCOPY (EGD);  Surgeon: Rogene Houston, MD;  [facility];  Service: Endoscopy;  Laterality: N/A;  11:15  . EYE SURGERY     cataract removal  . NECK SURGERY  x3  . VAGINAL HYSTERECTOMY      Current Outpatient Medications on File Prior to Visit  Medication Sig Dispense Refill  . allopurinol (ZYLOPRIM) 100 MG tablet Take 100 mg by mouth daily.    Marland Kitchen ALPRAZolam (XANAX) 1 MG tablet TAKE ONE TABLET BY MOUTH AT BEDTIME AS NEEDED 90 tablet 3  . aspirin EC 81 MG tablet Take 81 mg by mouth daily.    . chlorzoxazone (PARAFON FORTE DSC) 500 MG tablet 1 qhs prn muscle tightness caution drowsiness 14 tablet 0  . conjugated estrogens (PREMARIN) vaginal cream Place 1 Applicatorful vaginally every other day.    Marland Kitchen dexlansoprazole (DEXILANT) 60 MG capsule Take 1 capsule (60 mg total) by mouth daily. 30 capsule 2  . Docusate Sodium (PHILLIPS STOOL SOFTENER PO) Take 2 capsules by mouth at bedtime.     Marland Kitchen doxycycline (VIBRA-TABS) 100 MG tablet Take 1 tablet (100 mg  total) by mouth 2 (two) times daily. 20 tablet 0  . finasteride (PROSCAR) 5 MG tablet Take 2.5 mg by mouth daily.   4  . minoxidil (ROGAINE) 2 % external solution Apply 1 application topically daily.     . naproxen (NAPROSYN) 500 MG tablet Take 1 tablet (500 mg total) by mouth 2 (two) times daily with a meal. 30 tablet 1  . OVER THE COUNTER MEDICATION Take 1 tablet by mouth daily as needed. Sennosides Stimulant Laxative    . potassium chloride (K-DUR) 10 MEQ tablet TAKE TWO TABLETS (20MEQ TOTAL) BY MOUTH DAILY 180 tablet 1  . pravastatin (PRAVACHOL) 80 MG  tablet TAKE ONE (1) TABLET BY MOUTH EVERY DAY 90 tablet 1  . sertraline (ZOLOFT) 50 MG tablet TAKE ONE TABLET (50MG  TOTAL) BY MOUTH DAILY 30 tablet 5  . sucralfate (CARAFATE) 1 g tablet Take 1 tablet (1 g total) by mouth 2 (two) times daily as needed. 60 tablet 2  . torsemide (DEMADEX) 20 MG tablet TAKE ONE TABLET (20MG  TOTAL) BY MOUTH EVERY MORNING 90 tablet 0   No current facility-administered medications on file prior to visit.     Social History   Socioeconomic History  . Marital status: Married    Spouse name: Not on file  . Number of children: Not on file  . Years of education: Not on file  . Highest education level: Not on file  Occupational History  . Not on file  Social Needs  . Financial resource strain: Not on file  . Food insecurity:    Worry: Not on file    Inability: Not on file  . Transportation needs:    Medical: Not on file    Non-medical: Not on file  Tobacco Use  . Smoking status: Never Smoker  . Smokeless tobacco: Never Used  Substance and Sexual Activity  . Alcohol use: No  . Drug use: No  . Sexual activity: Never  Lifestyle  . Physical activity:    Days per week: Not on file    Minutes per session: Not on file  . Stress: Not on file  Relationships  . Social connections:    Talks on phone: Not on file    Gets together: Not on file    Attends religious service: Not on file    Active member of club or organization: Not on file    Attends meetings of clubs or organizations: Not on file    Relationship status: Not on file  . Intimate partner violence:    Fear of current or ex partner: Not on file    Emotionally abused: Not on file    Physically abused: Not on file    Forced sexual activity: Not on file  Other Topics Concern  . Not on file  Social History Narrative  . Not on file    Family History  Problem Relation Age of Onset  . Bone cancer Mother   . Cancer Mother   . Prostate cancer Father   . Cancer Father   . Heart disease Father     . Prostate cancer Brother   . Heart disease Brother   . Anesthesia problems Neg Hx   . Hypotension Neg Hx   . Malignant hyperthermia Neg Hx   . Pseudochol deficiency Neg Hx     BP 125/70   Pulse 64   Ht 5\' 2"  (1.575 m)   Wt 140 lb (63.5 kg)   BMI 25.61 kg/m   Body mass index  is 25.61 kg/m.     Objective:   Physical Exam Vitals signs reviewed.  Constitutional:      Appearance: She is well-developed.  HENT:     Head: Normocephalic and atraumatic.  Eyes:     Conjunctiva/sclera: Conjunctivae normal.     Pupils: Pupils are equal, round, and reactive to light.  Neck:     Musculoskeletal: Normal range of motion and neck supple.  Cardiovascular:     Rate and Rhythm: Normal rate and regular rhythm.  Pulmonary:     Effort: Pulmonary effort is normal.  Abdominal:     Palpations: Abdomen is soft.  Musculoskeletal:     Left shoulder: She exhibits decreased range of motion and tenderness.       Arms:  Skin:    General: Skin is warm and dry.  Neurological:     Mental Status: She is alert and oriented to person, place, and time.     Cranial Nerves: No cranial nerve deficit.     Motor: No abnormal muscle tone.     Coordination: Coordination normal.     Deep Tendon Reflexes: Reflexes are normal and symmetric. Reflexes normal.  Psychiatric:        Behavior: Behavior normal.        Thought Content: Thought content normal.        Judgment: Judgment normal.   X-rays were done of the left shoulder, reported separately.        Assessment & Plan:   Encounter Diagnosis  Name Primary?  . Pain in joint of left shoulder Yes   PROCEDURE NOTE:  The patient request injection, verbal consent was obtained.  The left shoulder was prepped appropriately after time out was performed.   Sterile technique was observed and injection of 1 cc of Depo-Medrol 40 mg with several cc's of plain xylocaine. Anesthesia was provided by ethyl chloride and a 20-gauge needle was used to inject the  shoulder area. A posterior approach was used.  The injection was tolerated well.  A band aid dressing was applied.  The patient was advised to apply ice later today and tomorrow to the injection sight as needed.  She is to continue the Naprosyn.  Return in two weeks.  Call if any problem.  Precautions discussed.   Electronically Signed Sanjuana Kava, MD 3/10/202010:22 AM

## 2018-08-20 ENCOUNTER — Telehealth: Payer: Self-pay | Admitting: Orthopedic Surgery

## 2018-08-20 NOTE — Telephone Encounter (Signed)
Patient came in to office today requesting to change / re-schedule her upcoming follow up appointment 08/28/18 for shoulder pain with Dr Luna Glasgow (she had one visit for this problem); states she would like to see Dr Aline Brochure as she initially asked to do per referral by primary care, Dr Wolfgang Phoenix. Please review and advise.

## 2018-08-20 NOTE — Telephone Encounter (Signed)
Called back to patient, and scheduled accordingly. Patient aware.

## 2018-08-20 NOTE — Telephone Encounter (Signed)
She can see who she wants but why ???  Has to be a new patient slot for me

## 2018-08-24 ENCOUNTER — Telehealth: Payer: Self-pay | Admitting: Family Medicine

## 2018-08-24 NOTE — Telephone Encounter (Signed)
Okay; thanks.

## 2018-08-24 NOTE — Telephone Encounter (Signed)
Pt went and saw Dr. Luna Glasgow on 3/10 and got a shot in her left shoulder. She states it is better but not where she thinks it should be. She is going 3/30 to see Dr. Aline Brochure

## 2018-08-27 ENCOUNTER — Other Ambulatory Visit: Payer: Self-pay | Admitting: Family Medicine

## 2018-08-28 ENCOUNTER — Ambulatory Visit: Payer: Medicare Other | Admitting: Orthopedic Surgery

## 2018-09-03 ENCOUNTER — Ambulatory Visit: Payer: Medicare Other | Admitting: Orthopedic Surgery

## 2018-10-15 ENCOUNTER — Other Ambulatory Visit: Payer: Self-pay

## 2018-10-15 ENCOUNTER — Ambulatory Visit (INDEPENDENT_AMBULATORY_CARE_PROVIDER_SITE_OTHER): Payer: Medicare Other | Admitting: Orthopedic Surgery

## 2018-10-15 ENCOUNTER — Encounter: Payer: Self-pay | Admitting: Orthopedic Surgery

## 2018-10-15 VITALS — BP 139/82 | HR 72 | Temp 96.7°F | Ht 62.0 in | Wt 139.0 lb

## 2018-10-15 DIAGNOSIS — G8929 Other chronic pain: Secondary | ICD-10-CM | POA: Diagnosis not present

## 2018-10-15 DIAGNOSIS — M542 Cervicalgia: Secondary | ICD-10-CM

## 2018-10-15 DIAGNOSIS — M75122 Complete rotator cuff tear or rupture of left shoulder, not specified as traumatic: Secondary | ICD-10-CM | POA: Diagnosis not present

## 2018-10-15 DIAGNOSIS — M25512 Pain in left shoulder: Secondary | ICD-10-CM | POA: Diagnosis not present

## 2018-10-15 NOTE — Progress Notes (Signed)
New problem:  Patient ID: Garnette Gunner, female   DOB: 1946/03/08, 73 y.o.   MRN: [id number]  Chief Complaint  Patient presents with  . Shoulder Pain    Starts in shoulder and moves to neck since Jan '20  . Neck Pain    HPI PAMALA HAYMAN is a 73 y.o. female.  73 year old female presents for evaluation of ongoing left shoulder pain.  She has had 3 cervical procedures last saw Dr. Ellene Route 1999 presents to [place] today with left shoulder pain previously treated with injection and Naprosyn for pain that started in January.  The pain has progressed she now has some neck pain as well in the midline with some decreased range of motion  However her shoulder seems to be the major issue right now with decreased range of motion weakness inability to use the left upper extremity for activities of daily living grooming or eating.  She has failed NSAID therapy and injection its been for months since initial treatment.   Review of Systems Review of Systems  Constitutional: Negative for fever.  Musculoskeletal: Positive for neck pain.  Skin: Negative.   Neurological: Positive for focal weakness. Negative for tingling and sensory change.    Past Medical History:  Diagnosis Date  . Arthritis   . Gall stones   . GERD (gastroesophageal reflux disease)   . History of cardiac catheterization 2005   Minor elevation in cardiac enzymes however no significant obstructive CAD  . Hyperlipidemia   . Hypertension   . Insomnia   . Prediabetes     Past Surgical History:  Procedure Laterality Date  . A-FLUTTER ABLATION N/A 08/01/2017   Procedure: A-FLUTTER ABLATION;  Surgeon: Thompson Grayer, MD;  Location: [facility];  Service: Cardiovascular;  Laterality: N/A;  . ABDOMINAL HYSTERECTOMY    . BACK SURGERY  1992  . CARDIAC CATHETERIZATION     BACK IN 2004  SHE THINKS IT CAME BACK 'NORMAL'  . CHOLECYSTECTOMY N/A 06/24/2015   Procedure: LAPAROSCOPIC CHOLECYSTECTOMY;  Surgeon: Mickeal Skinner, MD;   Location: [facility];  Service: General;  Laterality: N/A;  . COLONOSCOPY N/A 08/30/2012   Procedure: COLONOSCOPY;  Surgeon: Rogene Houston, MD;  [facility];  Service: Endoscopy;  Laterality: N/A;  830  . DILATION AND CURETTAGE OF UTERUS    . ESOPHAGOGASTRODUODENOSCOPY  04/15/2011   Procedure: ESOPHAGOGASTRODUODENOSCOPY (EGD);  Surgeon: Rogene Houston, MD;  [facility];  Service: Endoscopy;  Laterality: N/A;  11:30  . ESOPHAGOGASTRODUODENOSCOPY N/A 05/27/2016   Procedure: ESOPHAGOGASTRODUODENOSCOPY (EGD);  Surgeon: Rogene Houston, MD;  [facility];  Service: Endoscopy;  Laterality: N/A;  11:15  . EYE SURGERY     cataract removal  . NECK SURGERY  x3  . VAGINAL HYSTERECTOMY      Family History  Problem Relation Age of Onset  . Bone cancer Mother   . Cancer Mother   . Prostate cancer Father   . Cancer Father   . Heart disease Father   . Prostate cancer Brother   . Heart disease Brother   . Anesthesia problems Neg Hx   . Hypotension Neg Hx   . Malignant hyperthermia Neg Hx   . Pseudochol deficiency Neg Hx      Social History   Tobacco Use  . Smoking status: Never Smoker  . Smokeless tobacco: Never Used  Substance Use Topics  . Alcohol use: No  . Drug use: No    Allergies  Allergen Reactions  .  Phenobarbital Other (See Comments)    Causes blurred vision    Allergies  Allergen Reactions  . Phenobarbital Other (See Comments)    Causes blurred vision     Current Meds  Medication Sig  . allopurinol (ZYLOPRIM) 100 MG tablet Take 100 mg by mouth daily.  Marland Kitchen ALPRAZolam (XANAX) 1 MG tablet TAKE ONE TABLET BY MOUTH AT BEDTIME AS NEEDED  . aspirin EC 81 MG tablet Take 81 mg by mouth daily.  Mariane Baumgarten Sodium (PHILLIPS STOOL SOFTENER PO) Take 2 capsules by mouth at bedtime.   Marland Kitchen estradiol (ESTRACE) 2 MG tablet   . finasteride (PROSCAR) 5 MG tablet Take 2.5 mg by mouth daily.   . potassium chloride (K-DUR) 10 MEQ tablet TAKE TWO TABLETS (20MEQ  TOTAL) BY MOUTH DAILY  . pravastatin (PRAVACHOL) 80 MG tablet TAKE ONE (1) TABLET BY MOUTH EVERY DAY  . sertraline (ZOLOFT) 50 MG tablet TAKE ONE TABLET (50MG  TOTAL) BY MOUTH DAILY  . torsemide (DEMADEX) 20 MG tablet TAKE ONE TABLET BY MOUTH EVERY MORNING       Physical Exam BP 139/82   Pulse 72   Temp (!) 96.7 F (35.9 C)   Ht 5\' 2"  (1.575 m)   Wt 139 lb (63 kg)   BMI 25.42 kg/m  Physical Exam Vitals signs and nursing note reviewed.  Constitutional:      Appearance: Normal appearance.  Musculoskeletal:       Arms:  Neurological:     Mental Status: She is alert and oriented to person, place, and time.     Deep Tendon Reflexes:     Reflex Scores:      Tricep reflexes are 2+ on the right side and 2+ on the left side.      Bicep reflexes are 2+ on the right side and 2+ on the left side.    Comments: Strength normal bilaterally  Psychiatric:        Mood and Affect: Mood normal.    Ambulatory status normal with no assistive devices Right Shoulder Exam  Right shoulder exam is normal.  Tenderness  The patient is experiencing no tenderness.  Range of Motion  The patient has normal right shoulder ROM.  Muscle Strength  The patient has normal right shoulder strength.  Tests  Apprehension: negative  Other  Erythema: absent Sensation: normal Pulse: present   Left Shoulder Exam  Left shoulder exam is normal.  Tenderness  Left shoulder tenderness location: Tenderness to palpation around the anterior lateral shoulder deltoid and cervical spine.  Tests  Apprehension: negative Hawkins test: negative Impingement: positive Drop arm: negative Sulcus: absent  Other  Erythema: absent Sensation: normal Pulse: present   Comments:  Active range of motion: Abduction 40 degrees external rotation 45 degrees  Passive range of motion full painful range of motion arc 70 through 150 degrees external rotation 50 degrees  Cuff strength once in abduction internal rotation  and flexion mild weakness grade 4        PROVOCATIVE TESTS DROP ARM TEST normal  EMPTY CAN -JOBST TEST weakness 4/5 EXTERNAL ROTATION LAG TEST normal  LIFT OFF TEST normal  BELLYPRESS TEST normal    MEDICAL DECISION SECTION  xrays ordered? No   My independent reading of xrays:   She had a 5 view neck and shoulder x-ray  2 views of the left shoulder show no fracture dislocation or general glenohumeral arthritis  Her cervical spine x-ray shows previous multiple fusion areas with multiple areas of degenerative disc disease  Encounter Diagnosis  Name Primary?  Marland Kitchen Nontraumatic complete tear of left rotator cuff Yes     PLAN:   No orders of the defined types were placed in this encounter.   MRI SHOULDER LEFT  SEE DR Ellene Route FOR NECK   USE TYLENOL OR ADVIL  800mg  ibuprofen 3 times a day FOR PAIN   APPLY BIOFREEZE (OTC) 3 TIMES A DAY    Call results to the patient

## 2018-10-15 NOTE — Patient Instructions (Addendum)
MRI SHOULDER LEFT  SEE DR Ellene Route FOR NECK   USE TYLENOL OR ADVIL  800mg  ibuprofen 3 times a day FOR PAIN   APPLY BIOFREEZE (OTC) 3 TIMES A DAY

## 2018-10-19 ENCOUNTER — Ambulatory Visit
Admission: RE | Admit: 2018-10-19 | Discharge: 2018-10-19 | Disposition: A | Discharge status: Home / Self Care | Payer: Medicare Other | Source: Ambulatory Visit | Attending: Orthopedic Surgery | Admitting: Orthopedic Surgery

## 2018-10-19 ENCOUNTER — Other Ambulatory Visit: Payer: Self-pay

## 2018-10-19 DIAGNOSIS — G8929 Other chronic pain: Secondary | ICD-10-CM

## 2018-10-19 DIAGNOSIS — M25512 Pain in left shoulder: Secondary | ICD-10-CM

## 2018-10-24 ENCOUNTER — Telehealth: Payer: Self-pay | Admitting: Radiology

## 2018-10-24 NOTE — Telephone Encounter (Signed)
Noted, thanks for calling her, I have put in a phone note so I will remember if she calls back.

## 2018-10-24 NOTE — Telephone Encounter (Signed)
Will you call patient with the MRI report?  IMPRESSION: 1. Mild supraspinatus tendinosis with small focal essentially full-thickness tear of the distal anterior tendon. 2. Full-thickness, near full width tear of the infraspinatus tendon with over 5 cm of retraction. Associated infraspinatus muscle strain. 3. Mild acromioclavicular and glenohumeral osteoarthritis.

## 2018-10-24 NOTE — Telephone Encounter (Signed)
-----   Message from Carole Civil, MD sent at 10/24/2018  1:06 PM EDT ----- No surgery at this time shes doing ok   Results given to the patient she has a rotator cuff tear 2 tendons.  I do not see the 5 cm of retraction noted on the infraspinatus tendon tear however it is of no consequence at this point.  The patient is functioning well she has decided not to have surgery but call us if the pain gets worse which I told her would be a sign that the tear may have been progressing  She is going to see Dr. Ellene Route this week to check on her neck which is important and it is a pain source generator as well

## 2018-11-09 DIAGNOSIS — Z6825 Body mass index (BMI) 25.0-25.9, adult: Secondary | ICD-10-CM | POA: Diagnosis not present

## 2018-11-09 DIAGNOSIS — M5412 Radiculopathy, cervical region: Secondary | ICD-10-CM | POA: Diagnosis not present

## 2018-11-09 DIAGNOSIS — I10 Essential (primary) hypertension: Secondary | ICD-10-CM | POA: Diagnosis not present

## 2018-11-12 ENCOUNTER — Other Ambulatory Visit: Payer: Self-pay | Admitting: Neurological Surgery

## 2018-11-12 DIAGNOSIS — M5412 Radiculopathy, cervical region: Secondary | ICD-10-CM

## 2018-11-14 ENCOUNTER — Other Ambulatory Visit: Payer: Self-pay

## 2018-11-14 ENCOUNTER — Other Ambulatory Visit: Payer: Medicare Other

## 2018-11-14 ENCOUNTER — Ambulatory Visit
Admission: RE | Admit: 2018-11-14 | Discharge: 2018-11-14 | Disposition: A | Discharge status: Home / Self Care | Payer: Medicare Other | Source: Ambulatory Visit | Attending: Neurological Surgery | Admitting: Neurological Surgery

## 2018-11-14 DIAGNOSIS — M5412 Radiculopathy, cervical region: Secondary | ICD-10-CM

## 2018-11-14 DIAGNOSIS — M4802 Spinal stenosis, cervical region: Secondary | ICD-10-CM | POA: Diagnosis not present

## 2018-11-21 DIAGNOSIS — M4802 Spinal stenosis, cervical region: Secondary | ICD-10-CM | POA: Diagnosis not present

## 2018-11-21 DIAGNOSIS — M5412 Radiculopathy, cervical region: Secondary | ICD-10-CM | POA: Diagnosis not present

## 2018-11-28 ENCOUNTER — Other Ambulatory Visit: Payer: Self-pay | Admitting: Family Medicine

## 2018-12-10 DIAGNOSIS — Z1159 Encounter for screening for other viral diseases: Secondary | ICD-10-CM | POA: Diagnosis not present

## 2018-12-13 DIAGNOSIS — M50223 Other cervical disc displacement at C6-C7 level: Secondary | ICD-10-CM | POA: Diagnosis not present

## 2018-12-13 DIAGNOSIS — M5412 Radiculopathy, cervical region: Secondary | ICD-10-CM | POA: Diagnosis not present

## 2018-12-13 DIAGNOSIS — M2578 Osteophyte, vertebrae: Secondary | ICD-10-CM | POA: Diagnosis not present

## 2018-12-13 DIAGNOSIS — M47812 Spondylosis without myelopathy or radiculopathy, cervical region: Secondary | ICD-10-CM | POA: Diagnosis not present

## 2018-12-13 HISTORY — PX: NECK SURGERY: SHX720

## 2018-12-21 ENCOUNTER — Other Ambulatory Visit: Payer: Self-pay | Admitting: Family Medicine

## 2018-12-21 NOTE — Telephone Encounter (Signed)
May have 90-day Recommend fall visit with lab work

## 2018-12-24 ENCOUNTER — Encounter

## 2018-12-24 ENCOUNTER — Inpatient Hospital Stay: Admit: 2018-12-24 | Payer: MEDICARE | Primary: Physician Assistant

## 2018-12-24 DIAGNOSIS — I10 Essential (primary) hypertension: Secondary | ICD-10-CM

## 2018-12-24 LAB — METABOLIC PANEL, COMPREHENSIVE
A-G Ratio: 1.2 (ref 0.8–1.7)
ALT (SGPT): 23 U/L (ref 13–56)
AST (SGOT): 21 U/L (ref 10–38)
Albumin: 4 g/dL (ref 3.4–5.0)
Alk. phosphatase: 100 U/L (ref 45–117)
Anion gap: 6 mmol/L (ref 3.0–18)
BUN/Creatinine ratio: 24 — ABNORMAL HIGH (ref 12–20)
BUN: 23 MG/DL — ABNORMAL HIGH (ref 7.0–18)
Bilirubin, total: 0.5 MG/DL (ref 0.2–1.0)
CO2: 33 mmol/L — ABNORMAL HIGH (ref 21–32)
Calcium: 9.7 MG/DL (ref 8.5–10.1)
Chloride: 104 mmol/L (ref 100–111)
Creatinine: 0.97 MG/DL (ref 0.6–1.3)
GFR est AA: >60 mL/min/{1.73_m2} (ref >60)
GFR est non-AA: 56 mL/min/{1.73_m2} — ABNORMAL LOW (ref >60)
Globulin: 3.3 g/dL (ref 2.0–4.0)
Glucose: 100 mg/dL — ABNORMAL HIGH (ref 74–99)
Potassium: 4.7 mmol/L (ref 3.5–5.5)
Protein, total: 7.3 g/dL (ref 6.4–8.2)
Sodium: 143 mmol/L (ref 136–145)

## 2018-12-24 LAB — LIPID PANEL
CHOL/HDL Ratio: 3.7 (ref 0–5.0)
Chol/HDL Ratio: 3.7 (ref 0–5.0)
Cholesterol, Total: 220 MG/DL — ABNORMAL HIGH (ref <200)
Cholesterol, total: 220 MG/DL — ABNORMAL HIGH (ref <200)
HDL Cholesterol: 60 MG/DL (ref 40–60)
HDL: 60 MG/DL (ref 40–60)
LDL Calculated: 130.4 MG/DL — ABNORMAL HIGH (ref 0–100)
LDL, calculated: 130.4 MG/DL — ABNORMAL HIGH (ref 0–100)
Triglyceride: 148 MG/DL (ref <150)
Triglycerides: 148 MG/DL (ref <150)
VLDL Cholesterol Calculated: 29.6 MG/DL
VLDL, calculated: 29.6 MG/DL

## 2018-12-24 LAB — CBC WITH AUTOMATED DIFF
ABS. BASOPHILS: 0 10*3/uL (ref 0.0–0.1)
ABS. EOSINOPHILS: 0.2 10*3/uL (ref 0.0–0.4)
ABS. LYMPHOCYTES: 1.8 10*3/uL (ref 0.9–3.6)
ABS. MONOCYTES: 0.5 10*3/uL (ref 0.05–1.2)
ABS. NEUTROPHILS: 3.2 10*3/uL (ref 1.8–8.0)
BASOPHILS: 0 % (ref 0–2)
EOSINOPHILS: 4 % (ref 0–5)
HCT: 42.5 % (ref 35.0–45.0)
HGB: 14.8 g/dL (ref 12.0–16.0)
LYMPHOCYTES: 32 % (ref 21–52)
MCH: 30.8 PG (ref 24.0–34.0)
MCHC: 34.8 g/dL (ref 31.0–37.0)
MCV: 88.5 FL (ref 74.0–97.0)
MONOCYTES: 8 % (ref 3–10)
MPV: 10.2 FL (ref 9.2–11.8)
NEUTROPHILS: 56 % (ref 40–73)
PLATELET: 231 10*3/uL (ref 135–420)
RBC: 4.8 M/uL (ref 4.20–5.30)
RDW: 13 % (ref 11.6–14.5)
WBC: 5.7 10*3/uL (ref 4.6–13.2)

## 2018-12-24 LAB — T4, FREE
T4 Free: 1.5 NG/DL (ref 0.7–1.5)
T4, Free: 1.5 NG/DL (ref 0.7–1.5)

## 2018-12-24 LAB — TSH 3RD GENERATION
TSH: 0.34 u[IU]/mL — ABNORMAL LOW (ref 0.36–3.74)
TSH: 0.34 u[IU]/mL — ABNORMAL LOW (ref 0.36–3.74)

## 2018-12-24 LAB — CBC WITH AUTO DIFFERENTIAL
Basophils %: 0 % (ref 0–2)
Basophils Absolute: 0 10*3/uL (ref 0.0–0.1)
Eosinophils %: 4 % (ref 0–5)
Eosinophils Absolute: 0.2 10*3/uL (ref 0.0–0.4)
Hematocrit: 42.5 % (ref 35.0–45.0)
Hemoglobin: 14.8 g/dL (ref 12.0–16.0)
Lymphocytes %: 32 % (ref 21–52)
Lymphocytes Absolute: 1.8 10*3/uL (ref 0.9–3.6)
MCH: 30.8 PG (ref 24.0–34.0)
MCHC: 34.8 g/dL (ref 31.0–37.0)
MCV: 88.5 FL (ref 74.0–97.0)
MPV: 10.2 FL (ref 9.2–11.8)
Monocytes %: 8 % (ref 3–10)
Monocytes Absolute: 0.5 10*3/uL (ref 0.05–1.2)
Neutrophils %: 56 % (ref 40–73)
Neutrophils Absolute: 3.2 10*3/uL (ref 1.8–8.0)
Platelets: 231 10*3/uL (ref 135–420)
RBC: 4.8 M/uL (ref 4.20–5.30)
RDW: 13 % (ref 11.6–14.5)
WBC: 5.7 10*3/uL (ref 4.6–13.2)

## 2018-12-24 LAB — COMPREHENSIVE METABOLIC PANEL
ALT: 23 U/L (ref 13–56)
AST: 21 U/L (ref 10–38)
Albumin/Globulin Ratio: 1.2 (ref 0.8–1.7)
Albumin: 4 g/dL (ref 3.4–5.0)
Alkaline Phosphatase: 100 U/L (ref 45–117)
Anion Gap: 6 mmol/L (ref 3.0–18)
BUN: 23 MG/DL — ABNORMAL HIGH (ref 7.0–18)
Bun/Cre Ratio: 24 — ABNORMAL HIGH (ref 12–20)
CO2: 33 mmol/L — ABNORMAL HIGH (ref 21–32)
Calcium: 9.7 MG/DL (ref 8.5–10.1)
Chloride: 104 mmol/L (ref 100–111)
Creatinine: 0.97 MG/DL (ref 0.6–1.3)
EGFR IF NonAfrican American: 56 mL/min/{1.73_m2} — ABNORMAL LOW (ref >60)
GFR African American: >60 mL/min/{1.73_m2} (ref >60)
Globulin: 3.3 g/dL (ref 2.0–4.0)
Glucose: 100 mg/dL — ABNORMAL HIGH (ref 74–99)
Potassium: 4.7 mmol/L (ref 3.5–5.5)
Sodium: 143 mmol/L (ref 136–145)
Total Bilirubin: 0.5 MG/DL (ref 0.2–1.0)
Total Protein: 7.3 g/dL (ref 6.4–8.2)

## 2019-01-02 DIAGNOSIS — M4802 Spinal stenosis, cervical region: Secondary | ICD-10-CM | POA: Diagnosis not present

## 2019-01-02 DIAGNOSIS — M5412 Radiculopathy, cervical region: Secondary | ICD-10-CM | POA: Diagnosis not present

## 2019-01-14 ENCOUNTER — Other Ambulatory Visit: Payer: Self-pay

## 2019-01-14 ENCOUNTER — Ambulatory Visit (INDEPENDENT_AMBULATORY_CARE_PROVIDER_SITE_OTHER): Payer: Medicare Other | Admitting: Family Medicine

## 2019-01-14 DIAGNOSIS — N3000 Acute cystitis without hematuria: Secondary | ICD-10-CM | POA: Diagnosis not present

## 2019-01-14 MED ORDER — CEFPROZIL 500 MG PO TABS: 500.0000 mg | ORAL_TABLET | Freq: Two times a day (BID) | ORAL | 0 refills | Status: DC

## 2019-01-14 NOTE — Progress Notes (Signed)
   Subjective:    Patient ID: Terri Douglas, female    DOB: 06-08-1945, 73 y.o.   MRN: [id number]  HPI  Patient calls with dysuria since Friday. Patient states she has a history of UTIs in the past and this feels exactly like that and she needs an antibiotic. Dysuria urinary frequency lower abdominal discomfort denies hematuria flank pain no vomiting diarrhea Virtual Visit via Video Note  I connected with Terri Douglas on 01/14/19 at  2:00 PM EDT by a video enabled telemedicine application and verified that I am speaking with the correct person using two identifiers.  Location: Patient: home Provider: office   I discussed the limitations of evaluation and management by telemedicine and the availability of in person appointments. The patient expressed understanding and agreed to proceed.  History of Present Illness:    Observations/Objective:   Assessment and Plan:   Follow Up Instructions:    I discussed the assessment and treatment plan with the patient. The patient was provided an opportunity to ask questions and all were answered. The patient agreed with the plan and demonstrated an understanding of the instructions.   The patient was advised to call back or seek an in-person evaluation if the symptoms worsen or if the condition fails to improve as anticipated.  I provided 15 minutes of non-face-to-face time during this encounter.      Review of Systems  Constitutional: Negative for activity change, appetite change and fatigue.  HENT: Negative for congestion.   Respiratory: Negative for cough.   Cardiovascular: Negative for chest pain.  Gastrointestinal: Negative for abdominal pain, constipation, diarrhea and nausea.  Genitourinary: Positive for dysuria and frequency.  Skin: Negative for color change.  Neurological: Negative for headaches.  Psychiatric/Behavioral: Negative for behavioral problems.       Objective:   Physical Exam  Today's visit was via  telephone Physical exam was not possible for this visit       Assessment & Plan:  UTI uncomplicated Cefzil 416 twice daily for 5 days Azo-Standard as needed warning signs were discussed in detail follow-up if progressive troubles or worse

## 2019-02-14 ENCOUNTER — Other Ambulatory Visit: Payer: Self-pay | Admitting: Internal Medicine

## 2019-02-14 ENCOUNTER — Other Ambulatory Visit: Payer: Self-pay | Admitting: Family Medicine

## 2019-02-18 ENCOUNTER — Telehealth: Payer: Self-pay | Admitting: Family Medicine

## 2019-02-18 ENCOUNTER — Other Ambulatory Visit: Payer: Self-pay | Admitting: Family Medicine

## 2019-02-18 DIAGNOSIS — E7849 Other hyperlipidemia: Secondary | ICD-10-CM

## 2019-02-18 DIAGNOSIS — I10 Essential (primary) hypertension: Secondary | ICD-10-CM

## 2019-02-18 NOTE — Telephone Encounter (Signed)
Last labs 03/2018: liver, cbc and met 7

## 2019-02-18 NOTE — Telephone Encounter (Signed)
Has appt in October for a 6 mos f/u and would like to have labs done in advance at [facility].  She said that she has to have labs done to get prescription refills.

## 2019-02-18 NOTE — Telephone Encounter (Signed)
Lipid, liver, metabolic 7 Hyperlipidemia, renal insufficiency

## 2019-02-18 NOTE — Telephone Encounter (Signed)
Blood work ordered in Epic. Patient notified. 

## 2019-02-26 ENCOUNTER — Ambulatory Visit (INDEPENDENT_AMBULATORY_CARE_PROVIDER_SITE_OTHER): Payer: Medicare Other | Admitting: Internal Medicine

## 2019-03-05 DIAGNOSIS — M25512 Pain in left shoulder: Secondary | ICD-10-CM | POA: Diagnosis not present

## 2019-03-05 DIAGNOSIS — M542 Cervicalgia: Secondary | ICD-10-CM | POA: Diagnosis not present

## 2019-03-13 DIAGNOSIS — E7849 Other hyperlipidemia: Secondary | ICD-10-CM | POA: Diagnosis not present

## 2019-03-13 DIAGNOSIS — I10 Essential (primary) hypertension: Secondary | ICD-10-CM | POA: Diagnosis not present

## 2019-03-14 LAB — BASIC METABOLIC PANEL
BUN/Creatinine Ratio: 14 (ref 12–28)
BUN: 12 mg/dL (ref 8–27)
CO2: 26 mmol/L (ref 20–29)
Calcium: 9.3 mg/dL (ref 8.7–10.3)
Chloride: 98 mmol/L (ref 96–106)
Creatinine, Ser: 0.83 mg/dL (ref 0.57–1.00)
GFR calc Af Amer: 81 mL/min/{1.73_m2} (ref >59)
GFR calc non Af Amer: 71 mL/min/{1.73_m2} (ref >59)
Glucose: 88 mg/dL (ref 65–99)
Potassium: 3.8 mmol/L (ref 3.5–5.2)
Sodium: 141 mmol/L (ref 134–144)

## 2019-03-14 LAB — LIPID PANEL
Chol/HDL Ratio: 3 ratio (ref 0.0–4.4)
Cholesterol, Total: 166 mg/dL (ref 100–199)
HDL: 56 mg/dL (ref >39)
LDL Chol Calc (NIH): 84 mg/dL (ref 0–99)
Triglycerides: 149 mg/dL (ref 0–149)
VLDL Cholesterol Cal: 26 mg/dL (ref 5–40)

## 2019-03-14 LAB — HEPATIC FUNCTION PANEL
ALT: 11 IU/L (ref 0–32)
AST: 20 IU/L (ref 0–40)
Albumin: 4.5 g/dL (ref 3.7–4.7)
Alkaline Phosphatase: 83 IU/L (ref 39–117)
Bilirubin Total: 0.4 mg/dL (ref 0.0–1.2)
Bilirubin, Direct: 0.11 mg/dL (ref 0.00–0.40)
Total Protein: 6.6 g/dL (ref 6.0–8.5)

## 2019-03-15 ENCOUNTER — Encounter: Payer: Self-pay | Admitting: Family Medicine

## 2019-03-15 ENCOUNTER — Other Ambulatory Visit: Payer: Self-pay

## 2019-03-15 ENCOUNTER — Ambulatory Visit (INDEPENDENT_AMBULATORY_CARE_PROVIDER_SITE_OTHER): Payer: Medicare Other | Admitting: Family Medicine

## 2019-03-15 ENCOUNTER — Telehealth: Payer: Self-pay | Admitting: Family Medicine

## 2019-03-15 VITALS — BP 110/72 | Temp 97.6°F | Ht 62.0 in | Wt 136.0 lb

## 2019-03-15 DIAGNOSIS — E7849 Other hyperlipidemia: Secondary | ICD-10-CM

## 2019-03-15 DIAGNOSIS — M25512 Pain in left shoulder: Secondary | ICD-10-CM

## 2019-03-15 DIAGNOSIS — I10 Essential (primary) hypertension: Secondary | ICD-10-CM | POA: Diagnosis not present

## 2019-03-15 DIAGNOSIS — G4700 Insomnia, unspecified: Secondary | ICD-10-CM

## 2019-03-15 DIAGNOSIS — Z23 Encounter for immunization: Secondary | ICD-10-CM

## 2019-03-15 MED ORDER — TORSEMIDE 20 MG PO TABS: 20.0000 mg | ORAL_TABLET | Freq: Every morning | ORAL | 1 refills | Status: DC

## 2019-03-15 MED ORDER — ALPRAZOLAM 1 MG PO TABS: ORAL_TABLET | ORAL | 1 refills | Status: DC

## 2019-03-15 MED ORDER — SERTRALINE HCL 50 MG PO TABS: ORAL_TABLET | ORAL | 5 refills | Status: DC

## 2019-03-15 MED ORDER — POTASSIUM CHLORIDE ER 10 MEQ PO TBCR: 10.0000 meq | EXTENDED_RELEASE_TABLET | Freq: Two times a day (BID) | ORAL | 5 refills | Status: DC

## 2019-03-15 MED ORDER — PRAVASTATIN SODIUM 80 MG PO TABS: ORAL_TABLET | ORAL | 1 refills | Status: DC

## 2019-03-15 NOTE — Telephone Encounter (Signed)
Patient was seen for presurgical clearance Please fax her office visit note and the letter that I dictated to Dr. Raliegh Ip in [place] on Monday so that they will go ahead and schedule her surgery thank you

## 2019-03-15 NOTE — Patient Instructions (Signed)
Results for orders placed or performed in visit on 02/18/19  Lipid panel  Result Value Ref Range   Cholesterol, Total 166 100 - 199 mg/dL   Triglycerides 149 0 - 149 mg/dL   HDL 56 >39 mg/dL   VLDL Cholesterol Cal 26 5 - 40 mg/dL   LDL Chol Calc (NIH) 84 0 - 99 mg/dL   Chol/HDL Ratio 3.0 0.0 - 4.4 ratio  Hepatic function panel  Result Value Ref Range   Total Protein 6.6 6.0 - 8.5 g/dL   Albumin 4.5 3.7 - 4.7 g/dL   Bilirubin Total 0.4 0.0 - 1.2 mg/dL   Bilirubin, Direct 0.11 0.00 - 0.40 mg/dL   Alkaline Phosphatase 83 39 - 117 IU/L   AST 20 0 - 40 IU/L   ALT 11 0 - 32 IU/L  Basic metabolic panel  Result Value Ref Range   Glucose 88 65 - 99 mg/dL   BUN 12 8 - 27 mg/dL   Creatinine, Ser 0.83 0.57 - 1.00 mg/dL   GFR calc non Af Amer 71 >59 mL/min/1.73   GFR calc Af Amer 81 >59 mL/min/1.73   BUN/Creatinine Ratio 14 12 - 28   Sodium 141 134 - 144 mmol/L   Potassium 3.8 3.5 - 5.2 mmol/L   Chloride 98 96 - 106 mmol/L   CO2 26 20 - 29 mmol/L   Calcium 9.3 8.7 - 10.3 mg/dL

## 2019-03-15 NOTE — Progress Notes (Signed)
Subjective:    Patient ID: Terri Douglas, female    DOB: 09-19-1945, 73 y.o.   MRN: [id number]  HPIneeds surgical clearance to have left shoulder replacement. Seeing Dr. Maryruth Eve at [facility] and [place]. This patient relates that she will be having left shoulder replacement coming up She denies any chest tightness pressure pain shortness of breath She states when she walks she does not get out of breath she is able to walk for at least 30 minutes without stopping she is able to go up and down flights of steps without trouble she denies any significant shortness of breath or issues Would like a flu vaccine today.  The patient relates that she will go to her pharmacy to get the senior dose  Patient has hyperlipidemia she takes her medication regular basis watches her diet closely Has high blood pressure she is careful about salt she denies any complications with the blood pressure takes her medicine on a regular basis Has intermittent pedal edema for which she does take torsemide in the morning along with potassium and denies any particular troubles   Review of Systems  Constitutional: Negative for activity change, appetite change, fatigue and fever.  HENT: Negative for congestion and rhinorrhea.   Respiratory: Negative for cough, chest tightness and shortness of breath.   Cardiovascular: Negative for chest pain and leg swelling.  Gastrointestinal: Negative for abdominal pain and nausea.  Musculoskeletal: Positive for arthralgias. Negative for back pain.  Skin: Negative for color change.  Neurological: Negative for dizziness and headaches.  Psychiatric/Behavioral: Negative for agitation and behavioral problems.       Objective:   Physical Exam Vitals signs reviewed.  Constitutional:      General: She is not in acute distress. HENT:     Head: Normocephalic and atraumatic.  Eyes:     General:        Right eye: No discharge.        Left eye: No discharge.  Neck:     Trachea: No  tracheal deviation.  Cardiovascular:     Rate and Rhythm: Normal rate and regular rhythm.     Heart sounds: Normal heart sounds. No murmur.  Pulmonary:     Effort: Pulmonary effort is normal. No respiratory distress.     Breath sounds: Normal breath sounds.  Lymphadenopathy:     Cervical: No cervical adenopathy.  Skin:    General: Skin is warm and dry.  Neurological:     Mental Status: She is alert.     Coordination: Coordination normal.  Psychiatric:        Behavior: Behavior normal.       Results for orders placed or performed in visit on 02/18/19  Lipid panel  Result Value Ref Range   Cholesterol, Total 166 100 - 199 mg/dL   Triglycerides 149 0 - 149 mg/dL   HDL 56 >39 mg/dL   VLDL Cholesterol Cal 26 5 - 40 mg/dL   LDL Chol Calc (NIH) 84 0 - 99 mg/dL   Chol/HDL Ratio 3.0 0.0 - 4.4 ratio  Hepatic function panel  Result Value Ref Range   Total Protein 6.6 6.0 - 8.5 g/dL   Albumin 4.5 3.7 - 4.7 g/dL   Bilirubin Total 0.4 0.0 - 1.2 mg/dL   Bilirubin, Direct 0.11 0.00 - 0.40 mg/dL   Alkaline Phosphatase 83 39 - 117 IU/L   AST 20 0 - 40 IU/L   ALT 11 0 - 32 IU/L  Basic metabolic panel  Result Value  Ref Range   Glucose 88 65 - 99 mg/dL   BUN 12 8 - 27 mg/dL   Creatinine, Ser 0.83 0.57 - 1.00 mg/dL   GFR calc non Af Amer 71 >59 mL/min/1.73   GFR calc Af Amer 81 >59 mL/min/1.73   BUN/Creatinine Ratio 14 12 - 28   Sodium 141 134 - 144 mmol/L   Potassium 3.8 3.5 - 5.2 mmol/L   Chloride 98 96 - 106 mmol/L   CO2 26 20 - 29 mmol/L   Calcium 9.3 8.7 - 10.3 mg/dL   Patient does not drink or smoke    Assessment & Plan:  Pneumonia shot today Patient has surgical clearance for her shoulder surgery Her risk is considered low risk from a cardiac standpoint I did discuss with her the importance of early mobilization to lessen the risk of blood clots Also told her to talk with the surgeon regarding any complications that could occur with shoulder surgery I do not feel she  needs cardiac clearance  She does use Xanax at nighttime to help her self with sleep She also takes sertraline to help her self with her moods which are doing well currently She was also advised no anti-inflammatories within 7 days of surgery including aspirin ibuprofen Aleve Goody powder 25 minutes was spent with the patient.  This statement verifies that 25 minutes was indeed spent with the patient.  More than 50% of this visit-total duration of the visit-was spent in counseling and coordination of care. The issues that the patient came in for today as reflected in the diagnosis (s) please refer to documentation for further details.

## 2019-03-18 NOTE — Telephone Encounter (Signed)
Office note and letter faxed to [employer].

## 2019-03-28 NOTE — Patient Instructions (Addendum)
DUE TO COVID-19 ONLY ONE VISITOR IS ALLOWED TO COME WITH YOU AND STAY IN THE WAITING ROOM ONLY DURING PRE OP AND PROCEDURE DAY OF SURGERY. THE 1 VISITOR MAY VISIT WITH YOU AFTER SURGERY IN YOUR PRIVATE ROOM DURING VISITING HOURS ONLY!  YOU NEED TO HAVE A COVID 19 TEST ON__10/24_____ @_11 :35_____, THIS TEST MUST BE DONE BEFORE SURGERY, COME  [address], [place] , [address].  ([name]) ONCE YOUR COVID TEST IS COMPLETED, PLEASE BEGIN THE QUARANTINE INSTRUCTIONS AS OUTLINED IN YOUR HANDOUT.                Terri Douglas   Your procedure is scheduled on: 04/03/19   Report to [facility] Main  Entrance   Report to admitting at   7:30AM     Call this number if you have problems the morning of surgery [place], NO CHEWING GUM CANDY OR MINTS.   Do not eat food After Midnight.    YOU MAY HAVE CLEAR LIQUIDS FROM MIDNIGHT UNTIL 7:00AM.   At 7:00 AM Please finish the prescribed Pre-Surgery Gatorade drink.   Nothing by mouth after you finish the Gatorade drink !   Take these medicines the morning of surgery with A SIP OF WATER: Zoloft, Allopurinol, Potassium, Finasteride                                 You may not have any metal on your body including hair pins and              piercings             Do not wear jewelry, make-up, lotions, powders or perfumes, deodorant             Do not wear nail polish on your fingernails.  Do not shave  48 hours prior to surgery.              Do not bring valuables to the hospital. [place].  Contacts, dentures or bridgework may not be worn into surgery.       Name and phone number of your driver:  Special Instructions: N/A              Please read over the following fact sheets you were given: _____________________________________________________________________             [facility] -  Preparing for Surgery  Before surgery, you can play an important role.   Because skin is not sterile, your skin needs to be as free of germs as possible.   You can reduce the number of germs on your skin by washing with CHG (chlorahexidine gluconate) soap before surgery.   CHG is an antiseptic cleaner which kills germs and bonds with the skin to continue killing germs even after washing. Please DO NOT use if you have an allergy to CHG or antibacterial soaps.   If your skin becomes reddened/irritated stop using the CHG and inform your nurse when you arrive at Short Stay. Do not shave (including legs and underarms) for at least 48 hours prior to the first CHG shower. . Please follow these instructions carefully:  1.  Shower with CHG Soap the night before surgery and  the  morning of Surgery.  2.  If you choose to wash your hair, wash your hair first as usual with your  normal  shampoo.  3.  After you shampoo, rinse your hair and body thoroughly to remove the  shampoo.                                        4.  Use CHG as you would any other liquid soap.  You can apply chg directly  to the skin and wash                       Gently with a scrungie or clean washcloth.  5.  Apply the CHG Soap to your body ONLY FROM THE NECK DOWN.   Do not use on face/ open                           Wound or open sores. Avoid contact with eyes, ears mouth and genitals (private parts).                       Wash face,  Genitals (private parts) with your normal soap.             6.  Wash thoroughly, paying special attention to the area where your surgery  will be performed.  7.  Thoroughly rinse your body with warm water from the neck down.  8.  DO NOT shower/wash with your normal soap after using and rinsing off  the CHG Soap.             9.  Pat yourself dry with a clean towel.            10.  Wear clean pajamas.            11.  Place clean sheets on your bed the night of your first shower and do not  sleep with  pets. Day of Surgery : Do not apply any lotions/deodorants the morning of surgery.  Please wear clean clothes to the hospital/surgery center.  FAILURE TO FOLLOW THESE INSTRUCTIONS MAY RESULT IN THE CANCELLATION OF YOUR SURGERY PATIENT SIGNATURE_________________________________  NURSE SIGNATURE__________________________________  ________________________________________________________________________   [name]  An incentive spirometer is a tool that can help keep your lungs clear and active. This tool measures how well you are filling your lungs with each breath. Taking long deep breaths may help reverse or decrease the chance of developing breathing (pulmonary) problems (especially infection) following:  A long period of time when you are unable to move or be active. BEFORE THE PROCEDURE   If the spirometer includes an indicator to show your best effort, your nurse or respiratory therapist will set it to a desired goal.  If possible, sit up straight or lean slightly forward. Try not to slouch.  Hold the incentive spirometer in an upright position. INSTRUCTIONS FOR USE  1. Sit on the edge of your bed if possible, or sit up as far as you can in bed or on a chair. 2. Hold the incentive spirometer in an upright position. 3. Breathe out normally. 4. Place the mouthpiece in your mouth and seal your lips tightly around it. 5. Breathe in slowly and as deeply as possible, raising the piston or the ball toward the top of the column. 6.  Hold your breath for 3-5 seconds or for as long as possible. Allow the piston or ball to fall to the bottom of the column. 7. Remove the mouthpiece from your mouth and breathe out normally. 8. Rest for a few seconds and repeat Steps 1 through 7 at least 10 times every 1-2 hours when you are awake. Take your time and take a few normal breaths between deep breaths. 9. The spirometer may include an indicator to show your best effort. Use the indicator  as a goal to work toward during each repetition. 10. After each set of 10 deep breaths, practice coughing to be sure your lungs are clear. If you have an incision (the cut made at the time of surgery), support your incision when coughing by placing a pillow or rolled up towels firmly against it. Once you are able to get out of bed, walk around indoors and cough well. You may stop using the incentive spirometer when instructed by your caregiver.  RISKS AND COMPLICATIONS  Take your time so you do not get dizzy or light-headed.  If you are in pain, you may need to take or ask for pain medication before doing incentive spirometry. It is harder to take a deep breath if you are having pain. AFTER USE  Rest and breathe slowly and easily.  It can be helpful to keep track of a log of your progress. Your caregiver can provide you with a simple table to help with this. If you are using the spirometer at home, follow these instructions: [place] IF:   You are having difficultly using the spirometer.  You have trouble using the spirometer as often as instructed.  Your pain medication is not giving enough relief while using the spirometer.  You develop fever of 100.5 F (38.1 C) or higher. SEEK IMMEDIATE MEDICAL CARE IF:   You cough up bloody sputum that had not been present before.  You develop fever of 102 F (38.9 C) or greater.  You develop worsening pain at or near the incision site. MAKE SURE YOU:   Understand these instructions.  Will watch your condition.  Will get help right away if you are not doing well or get worse. Document Released: 10/03/2006 Document Revised: 08/15/2011 Document Reviewed: 12/04/2006 [facility] Patient Information 2014 [place], [place].   ________________________________________________________________________

## 2019-03-29 ENCOUNTER — Other Ambulatory Visit: Payer: Self-pay

## 2019-03-29 ENCOUNTER — Encounter (HOSPITAL_COMMUNITY)
Admission: RE | Admit: 2019-03-29 | Discharge: 2019-03-29 | Disposition: A | Discharge status: Home / Self Care | Payer: Medicare Other | Source: Ambulatory Visit | Attending: Orthopaedic Surgery | Admitting: Orthopaedic Surgery

## 2019-03-29 ENCOUNTER — Encounter (HOSPITAL_COMMUNITY): Payer: Self-pay

## 2019-03-29 DIAGNOSIS — M25512 Pain in left shoulder: Secondary | ICD-10-CM | POA: Diagnosis not present

## 2019-03-29 DIAGNOSIS — E119 Type 2 diabetes mellitus without complications: Secondary | ICD-10-CM | POA: Diagnosis not present

## 2019-03-29 DIAGNOSIS — I10 Essential (primary) hypertension: Secondary | ICD-10-CM | POA: Insufficient documentation

## 2019-03-29 DIAGNOSIS — Z01818 Encounter for other preprocedural examination: Secondary | ICD-10-CM | POA: Insufficient documentation

## 2019-03-29 HISTORY — DX: Other complications of anesthesia, initial encounter: T88.59XA

## 2019-03-29 LAB — BASIC METABOLIC PANEL
Anion gap: 7 (ref 5–15)
BUN: 15 mg/dL (ref 8–23)
CO2: 29 mmol/L (ref 22–32)
Calcium: 9 mg/dL (ref 8.9–10.3)
Chloride: 105 mmol/L (ref 98–111)
Creatinine, Ser: 0.82 mg/dL (ref 0.44–1.00)
GFR calc Af Amer: >60 mL/min (ref >60)
GFR calc non Af Amer: >60 mL/min (ref >60)
Glucose, Bld: 98 mg/dL (ref 70–99)
Potassium: 4.2 mmol/L (ref 3.5–5.1)
Sodium: 141 mmol/L (ref 135–145)

## 2019-03-29 LAB — CBC
HCT: 41.1 % (ref 36.0–46.0)
Hemoglobin: 12.8 g/dL (ref 12.0–15.0)
MCH: 29.2 pg (ref 26.0–34.0)
MCHC: 31.1 g/dL (ref 30.0–36.0)
MCV: 93.8 fL (ref 80.0–100.0)
Platelets: 393 10*3/uL (ref 150–400)
RBC: 4.38 MIL/uL (ref 3.87–5.11)
RDW: 13.2 % (ref 11.5–15.5)
WBC: 5 10*3/uL (ref 4.0–10.5)
nRBC: 0 % (ref 0.0–0.2)

## 2019-03-29 LAB — HEMOGLOBIN A1C
Hgb A1c MFr Bld: 6.1 % — ABNORMAL HIGH (ref 4.8–5.6)
Mean Plasma Glucose: 128.37 mg/dL

## 2019-03-29 LAB — SURGICAL PCR SCREEN
MRSA, PCR: NEGATIVE
Staphylococcus aureus: NEGATIVE

## 2019-03-29 NOTE — H&P (Signed)
PREOPERATIVE H&P  Chief Complaint: LEFT SHOULDER DJD  HPI: Terri Douglas is a 73 y.o. female who presents for preoperative history and physical prior to scheduled surgery, REVERSE SHOULDER ARTHROPLASTY.   Patient has a past medical history significant for MI, Afib, GERD, HTN, hyperlipidemia, pre-diabetes.   Patient has long history of left shoulder pain. She had an MRI in May of 2020 and had a retracted infraspinatus tear and supraspinatus leading edge tear, and some mild to moderate arthritis of the joint.  She has been unhappy with her shoulder function.  She has got extremely poor range of motion and pain at this point.  She has failed conservative measures of injections, anti-inflammatories, and therapy. She rates her symptoms as moderate to severe, and have been worsening.  This is significantly impairing activities of daily living.    Please see clinic note for further details on this patient's care.    She has elected for surgical management.   Past Medical History:  Diagnosis Date  . Arthritis   . Gall stones   . GERD (gastroesophageal reflux disease)   . History of cardiac catheterization 2005   Minor elevation in cardiac enzymes however no significant obstructive CAD  . Hyperlipidemia   . Hypertension   . Insomnia   . Prediabetes    Past Surgical History:  Procedure Laterality Date  . A-FLUTTER ABLATION N/A 08/01/2017   Procedure: A-FLUTTER ABLATION;  Surgeon: Thompson Grayer, MD;  Location: [place] [facility];  Service: Cardiovascular;  Laterality: N/A;  . ABDOMINAL HYSTERECTOMY    . BACK SURGERY  1992  . CARDIAC CATHETERIZATION     BACK IN 2004  SHE THINKS IT CAME BACK 'NORMAL'  . CHOLECYSTECTOMY N/A 06/24/2015   Procedure: LAPAROSCOPIC CHOLECYSTECTOMY;  Surgeon: Mickeal Skinner, MD;  Location: [facility];  Service: General;  Laterality: N/A;  . COLONOSCOPY N/A 08/30/2012   Procedure: COLONOSCOPY;  Surgeon: Rogene Houston, MD;  [facility];  Service:  Endoscopy;  Laterality: N/A;  830  . DILATION AND CURETTAGE OF UTERUS    . ESOPHAGOGASTRODUODENOSCOPY  04/15/2011   Procedure: ESOPHAGOGASTRODUODENOSCOPY (EGD);  Surgeon: Rogene Houston, MD;  [facility];  Service: Endoscopy;  Laterality: N/A;  11:30  . ESOPHAGOGASTRODUODENOSCOPY N/A 05/27/2016   Procedure: ESOPHAGOGASTRODUODENOSCOPY (EGD);  Surgeon: Rogene Houston, MD;  [facility];  Service: Endoscopy;  Laterality: N/A;  11:15  . EYE SURGERY     cataract removal  . NECK SURGERY  x3  . VAGINAL HYSTERECTOMY     Social History   Socioeconomic History  . Marital status: Married    Spouse name: Not on file  . Number of children: Not on file  . Years of education: Not on file  . Highest education level: Not on file  Occupational History  . Not on file  Social Needs  . Financial resource strain: Not on file  . Food insecurity    Worry: Not on file    Inability: Not on file  . Transportation needs    Medical: Not on file    Non-medical: Not on file  Tobacco Use  . Smoking status: Never Smoker  . Smokeless tobacco: Never Used  Substance and Sexual Activity  . Alcohol use: No  . Drug use: No  . Sexual activity: Never  Lifestyle  . Physical activity    Days per week: Not on file    Minutes per session: Not on file  . Stress: Not on file  Relationships  . Social Herbalist on phone: Not on file    Gets together: Not on file    Attends religious service: Not on file    Active member of club or organization: Not on file    Attends meetings of clubs or organizations: Not on file    Relationship status: Not on file  Other Topics Concern  . Not on file  Social History Narrative  . Not on file   Family History  Problem Relation Age of Onset  . Bone cancer Mother   . Cancer Mother   . Prostate cancer Father   . Cancer Father   . Heart disease Father   . Prostate cancer Brother   . Heart disease Brother   . Anesthesia problems Neg Hx    . Hypotension Neg Hx   . Malignant hyperthermia Neg Hx   . Pseudochol deficiency Neg Hx    Allergies  Allergen Reactions  . Ibuprofen Hives  . Phenobarbital Other (See Comments)    Causes blurred vision   Prior to Admission medications   Medication Sig Start Date End Date Taking? Authorizing Provider  allopurinol (ZYLOPRIM) 100 MG tablet Take 100 mg by mouth daily.   Yes [provider]  ALPRAZolam (XANAX) 1 MG tablet TAKE ONE TABLET AT BEDTIME AS NEEDED Patient taking differently: Take 1 mg by mouth at bedtime. TAKE ONE TABLET AT BEDTIME AS NEEDED 03/15/19  Yes Kathyrn Drown, MD  Biotin 5000 MCG TABS Take 5,000 mcg by mouth daily.   Yes [provider]  docusate sodium (COLACE) 100 MG capsule Take 200 mg by mouth at bedtime.   Yes [provider]  estradiol (ESTRACE) 2 MG tablet Take 2 mg by mouth daily.  08/01/18  Yes [provider]  finasteride (PROSCAR) 5 MG tablet Take 2.5 mg by mouth daily.  08/27/15  Yes [provider]  minoxidil (ROGAINE) 2 % external solution Apply 1 application topically at bedtime.    Yes [provider]  Polyethyl Glycol-Propyl Glycol (LUBRICANT EYE DROPS) 0.4-0.3 % SOLN Place 1-2 drops into both eyes daily as needed (dry/irritated eyes.).   Yes [provider]  potassium chloride (KLOR-CON) 10 MEQ tablet Take 1 tablet (10 mEq total) by mouth 2 (two) times daily. Pt needs to call and make appt for further refills - 1st attempt Patient taking differently: Take 20 mEq by mouth daily. Pt needs to call and make appt for further refills - 1st attempt 03/15/19  Yes Luking, Elayne Snare, MD  pravastatin (PRAVACHOL) 80 MG tablet 1 qday Patient taking differently: Take 80 mg by mouth at bedtime.  03/15/19  Yes Kathyrn Drown, MD  Probiotic Product ([place]) Take 1 capsule by mouth daily.   Yes [provider]  Sennosides (LAXATIVE) 25 MG TABS Take 25 mg by mouth daily as needed  (constipation.).   Yes [provider]  sertraline (ZOLOFT) 50 MG tablet TAKE ONE TABLET (50MG  TOTAL) BY MOUTH DAILY Patient taking differently: Take 50 mg by mouth daily.  03/15/19  Yes Kathyrn Drown, MD  torsemide (DEMADEX) 20 MG tablet Take 1 tablet (20 mg total) by mouth every morning. 03/15/19  Yes Kathyrn Drown, MD    Positive ROS: All other systems have been reviewed and were otherwise negative with the exception of those mentioned in the HPI and as above.  Physical Exam: General: Alert, no acute distress Cardiovascular: No pedal edema Respiratory: No cyanosis, no  use of accessory musculature GI: No organomegaly, abdomen is soft and non-tender Skin: No lesions in the area of chief complaint Neurologic: Sensation intact distally Psychiatric: Patient is competent for consent with normal mood and affect Lymphatic: No axillary or cervical lymphadenopathy  MUSCULOSKELETAL:  Left shoulder: Active forward elevation to 80; passive to 90; external rotation to 30; internal rotation to back pocket.  Cuff strength is 4/5 supraspinatus and infraspinatus.  No skin abnormalities. Axillary nerve firing. No gross distal sensory or motor   Imaging: 1.  X-rays were reviewed which demonstrate mild glenohumeral arthritis changes, though extremely mild.  2.  MRI demonstrates a retracted infraspinatus tear and a leading edge supraspinatus tear with some moderate glenohumeral changes noted.  Assessment: LEFT SHOULDER DJD  Plan: Plan for Procedure(s): REVERSE SHOULDER ARTHROPLASTY  We talked to Terri Douglas about her condition at length.  She is very limited as far as range of motion and is very unhappy with her shoulder as it is currently.  We talked about the risks, benefits, and alternatives of arthroplasty in this setting versus rotator cuff repair.  She, unfortunately, likely has an infraspinatus tear that would be very difficult to repair, and at her age we don't think that a  supracapsular reconstruction is a very predictable option for her.  We think ideally the patient would be best served with a reverse total shoulder arthroplasty.  The patient agrees.   Patient was surgically cleared for surgery by her PCP. PCP recommended to avoid all anti-inflammatories 7 days prior to surgery.  The risks benefits and alternatives were discussed with the patient including but not limited to the risks of nonoperative treatment, versus surgical intervention including infection, bleeding, nerve injury,  blood clots, cardiopulmonary complications, morbidity, mortality, among others, and they were willing to proceed.   We additionally specifically discussed risks of axillary nerve injury, infection, periprosthetic fracture, continued pain and longevity of implants prior to beginning procedure.    The patient acknowledged the explanation, agreed to proceed with the plan and consent was signed.    Patient will be admitted for inpatient treatment for surgery, pain control, PT and/or OT, prophylactic antibiotics, VTE prophylaxis, and discharge planning. The patient is planning to be discharged home with outpatient PT.   The patient acknowledged the explanation, agreed to proceed with the plan and consent was signed.   Operative Plan: Left reverse total shoulder arthroplasty  Discharge Medications: Tylenol, Celebrex(history of GERD), Oxycodone DVT Prophylaxis: None required, Resume Aspirin post-op day 1 Physical Therapy: Outpatient PT, start date pending on surgery/bone quality  Special Discharge needs: [place]  03/29/2019 7:22 AM

## 2019-03-29 NOTE — Progress Notes (Signed)
PCP - Dr. Lance Sell Cardiologist - Dr. Rayann Heman  Chest x-ray - no EKG - 03/29/19 Stress Test -  ECHO - 06/30/17 Cardiac Cath -  A Flutter ablation- 08/01/17  Sleep Study - no CPAP -   Fasting Blood Sugar -  Checks Blood Sugar _____ times a day  Blood Thinner Instructions:no Aspirin Instructions: Last Dose:  Anesthesia review:   Patient denies shortness of breath, fever, cough and chest pain at PAT appointment yes  Patient verbalized understanding of instructions that were given to them at the PAT appointment. Patient was also instructed that they will need to review over the PAT instructions again at home before surgery. yes

## 2019-03-30 ENCOUNTER — Other Ambulatory Visit (HOSPITAL_COMMUNITY)
Admission: RE | Admit: 2019-03-30 | Discharge: 2019-03-30 | Disposition: A | Discharge status: Home / Self Care | Payer: Medicare Other | Source: Ambulatory Visit | Attending: Orthopaedic Surgery | Admitting: Orthopaedic Surgery

## 2019-03-30 DIAGNOSIS — Z20828 Contact with and (suspected) exposure to other viral communicable diseases: Secondary | ICD-10-CM | POA: Diagnosis not present

## 2019-03-30 DIAGNOSIS — Z01812 Encounter for preprocedural laboratory examination: Secondary | ICD-10-CM | POA: Diagnosis not present

## 2019-03-31 LAB — NOVEL CORONAVIRUS, NAA (HOSP ORDER, SEND-OUT TO REF LAB; TAT 18-24 HRS): SARS-CoV-2, NAA: NOT DETECTED

## 2019-04-01 MED ORDER — TISSEEL VH 10 ML EX KIT
PACK | CUTANEOUS | Status: AC
  Filled 2019-04-01: qty 1

## 2019-04-03 ENCOUNTER — Ambulatory Visit: Payer: Medicare Other | Admitting: Family Medicine

## 2019-04-03 ENCOUNTER — Encounter (HOSPITAL_COMMUNITY)
Admission: RE | Disposition: A | Discharge status: Home / Self Care | Payer: Self-pay | Source: Home / Self Care | Attending: Orthopaedic Surgery

## 2019-04-03 ENCOUNTER — Inpatient Hospital Stay (HOSPITAL_COMMUNITY)
Admission: RE | Admit: 2019-04-03 | Discharge: 2019-04-04 | DRG: 483 | Disposition: A | Discharge status: Home / Self Care | Payer: Medicare Other | Attending: Orthopaedic Surgery | Admitting: Orthopaedic Surgery

## 2019-04-03 ENCOUNTER — Encounter (HOSPITAL_COMMUNITY): Payer: Self-pay | Admitting: *Deleted

## 2019-04-03 ENCOUNTER — Ambulatory Visit (INDEPENDENT_AMBULATORY_CARE_PROVIDER_SITE_OTHER): Payer: Medicare Other | Admitting: Nurse Practitioner

## 2019-04-03 ENCOUNTER — Other Ambulatory Visit: Payer: Self-pay

## 2019-04-03 ENCOUNTER — Inpatient Hospital Stay (HOSPITAL_COMMUNITY): Payer: Medicare Other | Admitting: Certified Registered Nurse Anesthetist

## 2019-04-03 ENCOUNTER — Inpatient Hospital Stay (HOSPITAL_COMMUNITY): Payer: Medicare Other

## 2019-04-03 DIAGNOSIS — R7303 Prediabetes: Secondary | ICD-10-CM | POA: Diagnosis present

## 2019-04-03 DIAGNOSIS — M75102 Unspecified rotator cuff tear or rupture of left shoulder, not specified as traumatic: Secondary | ICD-10-CM | POA: Diagnosis present

## 2019-04-03 DIAGNOSIS — Z96612 Presence of left artificial shoulder joint: Secondary | ICD-10-CM | POA: Diagnosis not present

## 2019-04-03 DIAGNOSIS — I10 Essential (primary) hypertension: Secondary | ICD-10-CM | POA: Diagnosis present

## 2019-04-03 DIAGNOSIS — Z471 Aftercare following joint replacement surgery: Secondary | ICD-10-CM | POA: Diagnosis not present

## 2019-04-03 DIAGNOSIS — G8918 Other acute postprocedural pain: Secondary | ICD-10-CM | POA: Diagnosis not present

## 2019-04-03 DIAGNOSIS — E785 Hyperlipidemia, unspecified: Secondary | ICD-10-CM | POA: Diagnosis present

## 2019-04-03 DIAGNOSIS — Z79899 Other long term (current) drug therapy: Secondary | ICD-10-CM | POA: Diagnosis not present

## 2019-04-03 DIAGNOSIS — M659 Synovitis and tenosynovitis, unspecified: Secondary | ICD-10-CM | POA: Diagnosis present

## 2019-04-03 DIAGNOSIS — E7800 Pure hypercholesterolemia, unspecified: Secondary | ICD-10-CM | POA: Diagnosis not present

## 2019-04-03 DIAGNOSIS — Z20828 Contact with and (suspected) exposure to other viral communicable diseases: Secondary | ICD-10-CM | POA: Diagnosis present

## 2019-04-03 DIAGNOSIS — I4891 Unspecified atrial fibrillation: Secondary | ICD-10-CM | POA: Diagnosis present

## 2019-04-03 DIAGNOSIS — M19012 Primary osteoarthritis, left shoulder: Principal | ICD-10-CM | POA: Diagnosis present

## 2019-04-03 DIAGNOSIS — Z09 Encounter for follow-up examination after completed treatment for conditions other than malignant neoplasm: Secondary | ICD-10-CM

## 2019-04-03 DIAGNOSIS — K219 Gastro-esophageal reflux disease without esophagitis: Secondary | ICD-10-CM | POA: Diagnosis present

## 2019-04-03 HISTORY — PX: REVERSE SHOULDER ARTHROPLASTY: SHX5054

## 2019-04-03 SURGERY — ARTHROPLASTY, SHOULDER, TOTAL, REVERSE
Anesthesia: General | Site: Shoulder | Laterality: Left

## 2019-04-03 MED ORDER — TRANEXAMIC ACID-NACL 1000-0.7 MG/100ML-% IV SOLN
1000.0000 mg | INTRAVENOUS | Status: AC
  Administered 2019-04-03: 1000 mg via INTRAVENOUS
  Filled 2019-04-03: qty 100

## 2019-04-03 MED ORDER — CEFAZOLIN SODIUM-DEXTROSE 2-4 GM/100ML-% IV SOLN
2.0000 g | INTRAVENOUS | Status: AC
  Administered 2019-04-03: 2 g via INTRAVENOUS
  Filled 2019-04-03: qty 100

## 2019-04-03 MED ORDER — FENTANYL CITRATE (PF) 100 MCG/2ML IJ SOLN
INTRAMUSCULAR | Status: AC
  Filled 2019-04-03: qty 2

## 2019-04-03 MED ORDER — SCOPOLAMINE 1 MG/3DAYS TD PT72
MEDICATED_PATCH | TRANSDERMAL | Status: AC
  Filled 2019-04-03: qty 1

## 2019-04-03 MED ORDER — HYDROMORPHONE HCL 1 MG/ML IJ SOLN: 0.5000 mg | INTRAMUSCULAR | Status: DC | PRN

## 2019-04-03 MED ORDER — MAGNESIUM CITRATE PO SOLN: 1.0000 | Freq: Once | ORAL | Status: DC | PRN

## 2019-04-03 MED ORDER — PROPOFOL 10 MG/ML IV BOLUS
INTRAVENOUS | Status: DC | PRN
  Administered 2019-04-03: 30 mg via INTRAVENOUS
  Administered 2019-04-03: 100 mg via INTRAVENOUS

## 2019-04-03 MED ORDER — PHENYLEPHRINE 40 MCG/ML (10ML) SYRINGE FOR IV PUSH (FOR BLOOD PRESSURE SUPPORT)
PREFILLED_SYRINGE | INTRAVENOUS | Status: AC
  Filled 2019-04-03: qty 10

## 2019-04-03 MED ORDER — FENTANYL CITRATE (PF) 100 MCG/2ML IJ SOLN
INTRAMUSCULAR | Status: AC
  Administered 2019-04-03: 50 ug via INTRAVENOUS
  Filled 2019-04-03: qty 2

## 2019-04-03 MED ORDER — PROPOFOL 10 MG/ML IV BOLUS
INTRAVENOUS | Status: AC
  Filled 2019-04-03: qty 20

## 2019-04-03 MED ORDER — ROCURONIUM BROMIDE 10 MG/ML (PF) SYRINGE
PREFILLED_SYRINGE | INTRAVENOUS | Status: AC
  Filled 2019-04-03: qty 10

## 2019-04-03 MED ORDER — SUGAMMADEX SODIUM 200 MG/2ML IV SOLN
INTRAVENOUS | Status: DC | PRN
  Administered 2019-04-03: 150 mg via INTRAVENOUS

## 2019-04-03 MED ORDER — PHENYLEPHRINE HCL-NACL 10-0.9 MG/250ML-% IV SOLN
INTRAVENOUS | Status: DC | PRN
  Administered 2019-04-03: 35 ug/min via INTRAVENOUS

## 2019-04-03 MED ORDER — BUPIVACAINE LIPOSOME 1.3 % IJ SUSP
INTRAMUSCULAR | Status: DC | PRN
  Administered 2019-04-03: 10 mL via PERINEURAL

## 2019-04-03 MED ORDER — CEFAZOLIN SODIUM-DEXTROSE 1-4 GM/50ML-% IV SOLN
1.0000 g | Freq: Four times a day (QID) | INTRAVENOUS | Status: AC
  Administered 2019-04-03 – 2019-04-04 (×3): 1 g via INTRAVENOUS
  Filled 2019-04-03 (×3): qty 50

## 2019-04-03 MED ORDER — DEXAMETHASONE SODIUM PHOSPHATE 10 MG/ML IJ SOLN
INTRAMUSCULAR | Status: AC
  Filled 2019-04-03: qty 1

## 2019-04-03 MED ORDER — MENTHOL 3 MG MT LOZG
1.0000 | LOZENGE | OROMUCOSAL | Status: DC | PRN
  Filled 2019-04-03: qty 9

## 2019-04-03 MED ORDER — ZOLPIDEM TARTRATE 5 MG PO TABS: 5.0000 mg | ORAL_TABLET | Freq: Every evening | ORAL | Status: DC | PRN

## 2019-04-03 MED ORDER — ONDANSETRON HCL 4 MG/2ML IJ SOLN
INTRAMUSCULAR | Status: DC | PRN
  Administered 2019-04-03: 4 mg via INTRAVENOUS

## 2019-04-03 MED ORDER — SODIUM CHLORIDE 0.9 % IV SOLN
INTRAVENOUS | Status: DC | PRN
  Administered 2019-04-03: 250 mL via INTRAVENOUS

## 2019-04-03 MED ORDER — SERTRALINE HCL 50 MG PO TABS
50.0000 mg | ORAL_TABLET | Freq: Every day | ORAL | Status: DC
  Administered 2019-04-04: 50 mg via ORAL
  Filled 2019-04-03: qty 1

## 2019-04-03 MED ORDER — PHENOL 1.4 % MT LIQD: 1.0000 | OROMUCOSAL | Status: DC | PRN

## 2019-04-03 MED ORDER — BISACODYL 10 MG RE SUPP: 10.0000 mg | Freq: Every day | RECTAL | Status: DC | PRN

## 2019-04-03 MED ORDER — ONDANSETRON HCL 4 MG PO TABS: 4.0000 mg | ORAL_TABLET | Freq: Four times a day (QID) | ORAL | Status: DC | PRN

## 2019-04-03 MED ORDER — DEXAMETHASONE SODIUM PHOSPHATE 10 MG/ML IJ SOLN
INTRAMUSCULAR | Status: DC | PRN
  Administered 2019-04-03: 10 mg via INTRAVENOUS

## 2019-04-03 MED ORDER — LIDOCAINE 2% (20 MG/ML) 5 ML SYRINGE
INTRAMUSCULAR | Status: DC | PRN
  Administered 2019-04-03: 40 mg via INTRAVENOUS

## 2019-04-03 MED ORDER — SCOPOLAMINE 1 MG/3DAYS TD PT72
1.0000 | MEDICATED_PATCH | TRANSDERMAL | Status: DC
  Administered 2019-04-03: 1.5 mg via TRANSDERMAL

## 2019-04-03 MED ORDER — METOCLOPRAMIDE HCL 5 MG/ML IJ SOLN: 5.0000 mg | Freq: Three times a day (TID) | INTRAMUSCULAR | Status: DC | PRN

## 2019-04-03 MED ORDER — SODIUM CHLORIDE 0.9 % IR SOLN
Status: DC | PRN
  Administered 2019-04-03: 1000 mL

## 2019-04-03 MED ORDER — SUCCINYLCHOLINE CHLORIDE 200 MG/10ML IV SOSY
PREFILLED_SYRINGE | INTRAVENOUS | Status: AC
  Filled 2019-04-03: qty 10

## 2019-04-03 MED ORDER — LIDOCAINE 2% (20 MG/ML) 5 ML SYRINGE
INTRAMUSCULAR | Status: AC
  Filled 2019-04-03: qty 5

## 2019-04-03 MED ORDER — POLYETHYLENE GLYCOL 3350 17 G PO PACK: 17.0000 g | PACK | Freq: Every day | ORAL | Status: DC | PRN

## 2019-04-03 MED ORDER — CHLORHEXIDINE GLUCONATE 4 % EX LIQD: 60.0000 mL | Freq: Once | CUTANEOUS | Status: DC

## 2019-04-03 MED ORDER — FINASTERIDE 5 MG PO TABS
2.5000 mg | ORAL_TABLET | Freq: Every day | ORAL | Status: DC
  Administered 2019-04-03 – 2019-04-04 (×2): 2.5 mg via ORAL
  Filled 2019-04-03 (×2): qty 0.5

## 2019-04-03 MED ORDER — ACETAMINOPHEN 500 MG PO TABS
1000.0000 mg | ORAL_TABLET | Freq: Three times a day (TID) | ORAL | Status: DC
  Administered 2019-04-03 – 2019-04-04 (×3): 1000 mg via ORAL
  Filled 2019-04-03 (×3): qty 2

## 2019-04-03 MED ORDER — OXYCODONE HCL 5 MG PO TABS: 5.0000 mg | ORAL_TABLET | ORAL | Status: DC | PRN

## 2019-04-03 MED ORDER — 0.9 % SODIUM CHLORIDE (POUR BTL) OPTIME
TOPICAL | Status: DC | PRN
  Administered 2019-04-03: 1000 mL

## 2019-04-03 MED ORDER — PRAVASTATIN SODIUM 20 MG PO TABS
80.0000 mg | ORAL_TABLET | Freq: Every day | ORAL | Status: DC
  Administered 2019-04-03: 80 mg via ORAL
  Filled 2019-04-03 (×2): qty 4

## 2019-04-03 MED ORDER — STERILE WATER FOR IRRIGATION IR SOLN
Status: DC | PRN
  Administered 2019-04-03: 2000 mL

## 2019-04-03 MED ORDER — CELECOXIB 200 MG PO CAPS
200.0000 mg | ORAL_CAPSULE | Freq: Two times a day (BID) | ORAL | Status: DC
  Administered 2019-04-03 – 2019-04-04 (×2): 200 mg via ORAL
  Filled 2019-04-03 (×2): qty 1

## 2019-04-03 MED ORDER — FENTANYL CITRATE (PF) 100 MCG/2ML IJ SOLN
50.0000 ug | INTRAMUSCULAR | Status: DC
  Administered 2019-04-03 (×3): 50 ug via INTRAVENOUS

## 2019-04-03 MED ORDER — TORSEMIDE 20 MG PO TABS
20.0000 mg | ORAL_TABLET | Freq: Every morning | ORAL | Status: DC
  Administered 2019-04-04: 20 mg via ORAL
  Filled 2019-04-03 (×2): qty 1

## 2019-04-03 MED ORDER — ROCURONIUM BROMIDE 50 MG/5ML IV SOSY
PREFILLED_SYRINGE | INTRAVENOUS | Status: DC | PRN
  Administered 2019-04-03: 60 mg via INTRAVENOUS

## 2019-04-03 MED ORDER — ONDANSETRON HCL 4 MG/2ML IJ SOLN
INTRAMUSCULAR | Status: AC
  Filled 2019-04-03: qty 2

## 2019-04-03 MED ORDER — OXYCODONE HCL 5 MG/5ML PO SOLN: 5.0000 mg | Freq: Once | ORAL | Status: DC | PRN

## 2019-04-03 MED ORDER — ONDANSETRON HCL 4 MG/2ML IJ SOLN: 4.0000 mg | Freq: Four times a day (QID) | INTRAMUSCULAR | Status: DC | PRN

## 2019-04-03 MED ORDER — FENTANYL CITRATE (PF) 100 MCG/2ML IJ SOLN: 25.0000 ug | INTRAMUSCULAR | Status: DC | PRN

## 2019-04-03 MED ORDER — DOCUSATE SODIUM 100 MG PO CAPS
100.0000 mg | ORAL_CAPSULE | Freq: Two times a day (BID) | ORAL | Status: DC
  Administered 2019-04-03 – 2019-04-04 (×2): 100 mg via ORAL
  Filled 2019-04-03 (×2): qty 1

## 2019-04-03 MED ORDER — DIPHENHYDRAMINE HCL 12.5 MG/5ML PO ELIX: 12.5000 mg | ORAL_SOLUTION | ORAL | Status: DC | PRN

## 2019-04-03 MED ORDER — MIDAZOLAM HCL 2 MG/2ML IJ SOLN
INTRAMUSCULAR | Status: AC
  Administered 2019-04-03: 2 mg via INTRAVENOUS
  Filled 2019-04-03: qty 2

## 2019-04-03 MED ORDER — EPHEDRINE 5 MG/ML INJ
INTRAVENOUS | Status: AC
  Filled 2019-04-03: qty 10

## 2019-04-03 MED ORDER — ALLOPURINOL 100 MG PO TABS
100.0000 mg | ORAL_TABLET | Freq: Every day | ORAL | Status: DC
  Filled 2019-04-03 (×2): qty 1

## 2019-04-03 MED ORDER — BUPIVACAINE-EPINEPHRINE (PF) 0.5% -1:200000 IJ SOLN
INTRAMUSCULAR | Status: DC | PRN
  Administered 2019-04-03: 15 mL via PERINEURAL

## 2019-04-03 MED ORDER — OXYCODONE HCL 5 MG PO TABS: 5.0000 mg | ORAL_TABLET | Freq: Once | ORAL | Status: DC | PRN

## 2019-04-03 MED ORDER — VANCOMYCIN HCL 1 G IV SOLR
INTRAVENOUS | Status: DC | PRN
  Administered 2019-04-03: 1000 mg

## 2019-04-03 MED ORDER — LACTATED RINGERS IV SOLN
INTRAVENOUS | Status: DC
  Administered 2019-04-03: 08:00:00 via INTRAVENOUS

## 2019-04-03 MED ORDER — METOCLOPRAMIDE HCL 5 MG PO TABS: 5.0000 mg | ORAL_TABLET | Freq: Three times a day (TID) | ORAL | Status: DC | PRN

## 2019-04-03 MED ORDER — MIDAZOLAM HCL 2 MG/2ML IJ SOLN
1.0000 mg | INTRAMUSCULAR | Status: DC
  Administered 2019-04-03: 09:00:00 2 mg via INTRAVENOUS

## 2019-04-03 SURGICAL SUPPLY — 69 items
AID PSTN UNV HD RSTRNT DISP (MISCELLANEOUS) ×1
APL PRP STRL LF DISP 70% ISPRP (MISCELLANEOUS) ×2
BASEPLATE GLENOSPHERE 25 STD (Miscellaneous) ×1 IMPLANT
BIT DRILL 3.2 PERIPHERAL SCREW (BIT) ×1 IMPLANT
BLADE EXTENDED COATED 6.5IN (ELECTRODE) IMPLANT
BLADE SAW SAG 73X25 THK (BLADE) ×1
BLADE SAW SGTL 73X25 THK (BLADE) ×1 IMPLANT
BSPLAT GLND STD 25 RVRS SHLDR (Miscellaneous) ×1 IMPLANT
CHLORAPREP W/TINT 26 (MISCELLANEOUS) ×4 IMPLANT
CLSR STERI-STRIP ANTIMIC 1/2X4 (GAUZE/BANDAGES/DRESSINGS) ×2 IMPLANT
COOLER ICEMAN CLASSIC (MISCELLANEOUS) ×1 IMPLANT
COVER BACK TABLE 60X90IN (DRAPES) IMPLANT
COVER SURGICAL LIGHT HANDLE (MISCELLANEOUS) ×2 IMPLANT
COVER WAND RF STERILE (DRAPES) ×2 IMPLANT
DRAPE INCISE IOBAN 66X45 STRL (DRAPES) ×2 IMPLANT
DRAPE ORTHO SPLIT 77X108 STRL (DRAPES) ×4
DRAPE SHEET LG 3/4 BI-LAMINATE (DRAPES) ×3 IMPLANT
DRAPE SURG ORHT 6 SPLT 77X108 (DRAPES) ×2 IMPLANT
DRSG AQUACEL AG ADV 3.5X 6 (GAUZE/BANDAGES/DRESSINGS) ×2 IMPLANT
ELECT BLADE TIP CTD 4 INCH (ELECTRODE) ×2 IMPLANT
ELECT REM PT RETURN 15FT ADLT (MISCELLANEOUS) ×2 IMPLANT
GLENOSPHERE REV SHOULDER 36 (Joint) ×1 IMPLANT
GLOVE BIO SURGEON STRL SZ 6.5 (GLOVE) ×4 IMPLANT
GLOVE BIOGEL PI IND STRL 6.5 (GLOVE) ×1 IMPLANT
GLOVE BIOGEL PI IND STRL 8 (GLOVE) ×2 IMPLANT
GLOVE BIOGEL PI INDICATOR 6.5 (GLOVE) ×1
GLOVE BIOGEL PI INDICATOR 8 (GLOVE) ×2
GLOVE ECLIPSE 8.0 STRL XLNG CF (GLOVE) ×4 IMPLANT
GLOVE SURG ORTHO 8.0 STRL STRW (GLOVE) ×2 IMPLANT
GOWN SPEC L3 XXLG W/TWL (GOWN DISPOSABLE) ×2 IMPLANT
GOWN STRL REUS W/ TWL XL LVL3 (GOWN DISPOSABLE) ×1 IMPLANT
GOWN STRL REUS W/TWL LRG LVL3 (GOWN DISPOSABLE) ×2 IMPLANT
GOWN STRL REUS W/TWL XL LVL3 (GOWN DISPOSABLE) ×2
GUIDEWIRE GLENOID 2.5X220 (WIRE) ×1 IMPLANT
HANDPIECE INTERPULSE COAX TIP (DISPOSABLE) ×2
HEMOSTAT SURGICEL 2X14 (HEMOSTASIS) IMPLANT
IMPL REVERSE SHOULDER 0X3.5 (Shoulder) IMPLANT
IMPLANT REVERSE SHOULDER 0X3.5 (Shoulder) ×2 IMPLANT
INSERT HUMERAL 36X6MM 12.5DEG (Insert) ×1 IMPLANT
KIT BASIN OR (CUSTOM PROCEDURE TRAY) ×2 IMPLANT
KIT STABILIZATION SHOULDER (MISCELLANEOUS) ×2 IMPLANT
KIT TURNOVER KIT A (KITS) IMPLANT
MANIFOLD NEPTUNE II (INSTRUMENTS) ×2 IMPLANT
NDL HYPO 25X1 1.5 SAFETY (NEEDLE) IMPLANT
NDL MAYO CATGUT SZ4 TPR NDL (NEEDLE) IMPLANT
NEEDLE HYPO 25X1 1.5 SAFETY (NEEDLE) IMPLANT
NEEDLE MAYO CATGUT SZ4 (NEEDLE) IMPLANT
NS IRRIG 1000ML POUR BTL (IV SOLUTION) ×2 IMPLANT
PACK SHOULDER (CUSTOM PROCEDURE TRAY) ×2 IMPLANT
PAD COLD SHLDR WRAP-ON (PAD) ×1 IMPLANT
RESTRAINT HEAD UNIVERSAL NS (MISCELLANEOUS) ×2 IMPLANT
SCREW BONE 6.5X40 SM (Screw) ×1 IMPLANT
SCREW PERIPHERAL 30 (Screw) ×1 IMPLANT
SCREW PERIPHERAL 5.0X34 (Screw) ×1 IMPLANT
SET HNDPC FAN SPRY TIP SCT (DISPOSABLE) ×1 IMPLANT
SLING ULTRA III MED (ORTHOPEDIC SUPPLIES) ×3 IMPLANT
SPONGE LAP 18X18 X RAY DECT (DISPOSABLE) IMPLANT
STEM LONG PTC HUMERALTI SZ 2B (Stem) ×1 IMPLANT
SUCTION FRAZIER HANDLE 12FR (TUBING) ×1
SUCTION TUBE FRAZIER 12FR DISP (TUBING) ×1 IMPLANT
SUT ETHIBOND 2 V 37 (SUTURE) ×2 IMPLANT
SUT ETHIBOND NAB CT1 #1 30IN (SUTURE) ×2 IMPLANT
SUT FIBERWIRE #5 38 CONV NDL (SUTURE) ×8
SUT MON AB 3-0 SH 27 (SUTURE) ×2
SUT MON AB 3-0 SH27 (SUTURE) ×1 IMPLANT
SUT VIC AB 0 CT1 36 (SUTURE) ×2 IMPLANT
SUTURE FIBERWR #5 38 CONV NDL (SUTURE) ×4 IMPLANT
TOWEL OR 17X26 10 PK STRL BLUE (TOWEL DISPOSABLE) ×2 IMPLANT
WATER STERILE IRR 1000ML POUR (IV SOLUTION) ×4 IMPLANT

## 2019-04-03 NOTE — Op Note (Signed)
Orthopaedic Surgery Operative Note (CSN: [id number])  Terri Douglas  March 27, 1946 Date of Surgery: 04/03/2019   Diagnoses:  Left reverse shoulder arthroplasty for cuff tear and arthritis  Procedure: Left reverse total Shoulder Arthroplasty   Operative Finding Successful completion of planned procedure.  Good fixation of the end of the case.  Patient had some mild calcar fragmentation we noted after cut we felt that it would be beneficial to use a medium length stem fracture support.  Axillary nerve intact at the end of the case.  Post-operative plan: The patient will be NWB in sling.  The patient will be admitted overnight.  DVT prophylaxis not indicated in isolated upper extremity surgery patient with no specific risks factors.  Pain control with PRN pain medication preferring oral medicines.  Follow up plan will be scheduled in approximately 7 days for incision check and XR.  Physical therapy to start after 4 weeks.  Implants: Tornier longstem flex to, 0 high offset tray, 6 poly-, 36 standard glenosphere, 25 standard reversed to baseplate, 40 center screw and 2 peripheral locking screws.  Post-Op Diagnosis: Same Surgeons:Primary: Hiram Gash, MD Assistants:Caroline McBane [name] Location: [facility] ROOM 06 Anesthesia: General with Exparel Interscalene Antibiotics: Ancef 2g preop, Vancomycin 1019m locally Tourniquet time: None Estimated Blood Loss: 1096Complications: None Specimens: None Implants: Implant Name Type Inv. Item Serial No. Manufacturer Lot No. LRB No. Used Action  BASEPLATE GLENOSPHERE 228ZMSTD - [id number] BASEPLATE GLENOSPHERE 203TWSTD [id number] [employer]  Left 1 Implanted  SCREW BONE 6.5X40 SM - [id number] SCREW BONE 6.5X40 SM  [employer]  Left 1 Implanted  SCREW PERIPHERAL 30 - [id number] SCREW PERIPHERAL 30  [employer]  Left 1 Implanted  SCREW PERIPHERAL 5.0X34 - [id number] SCREW PERIPHERAL 5.0X34  [employer]  Left 1 Implanted   GLENOSPHERE REV SHOULDER 36 - [id number] GLENOSPHERE REV SHOULDER 36 [id number]  Left 1 Implanted  STEM LONG PTC HUMERALTI SZ 2B - [id number] STEM LONG PTC HUMERALTI SZ 2B [id number]  Left 1 Implanted  IMPLANT REVERSE SHOULDER 0X3.5 - [id number] IMPLANT REVERSE SHOULDER 0X3.5 [id number]  Left 1 Implanted  INSERT HUMERAL 36X6MM 12.5DEG - [id number] INSERT HUMERAL 36X6MM 12.5DEG [id number]  Left 1 Implanted    Indications for Surgery:   Terri BEHRMANis a 73y.o. 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  Benefits and risks of operative and nonoperative management were discussed prior to surgery with patient/guardian(s) and informed consent form was completed.  Infection and need for further surgery were discussed as was prosthetic stability and cuff issues.  We additionally specifically discussed risks of axillary nerve injury, infection, periprosthetic fracture, continued pain and longevity of implants prior to beginning procedure.      Procedure:   The patient was identified in the preoperative holding area where the surgical site was marked. Block placed by anesthesia with exparel.  The patient was taken to the OR where a procedural timeout was called and the above noted anesthesia was induced.  The patient was positioned beachchair on allen table with spider arm positioner.  Preoperative antibiotics were dosed.  The patient's left shoulder was prepped and draped in the usual sterile fashion.  A second preoperative timeout was called.       Standard deltopectoral approach was performed with a #10 blade. We dissected down to the subcutaneous tissues and the cephalic vein was  taken laterally with the deltoid. Clavipectoral fascia was incised in line with the incision. Deep retractors were placed. The long of the biceps tendon was identified and there  was significant tenosynovitis present.  Tenodesis was performed to the pectoralis tendon with #2 Ethibond. The remaining biceps was followed up into the rotator interval where it was released.   The subscapularis was taken down in a full thickness layer with capsule along the humeral neck extending inferiorly around the humeral head. We continued releasing the capsule directly off of the osteophytes inferiorly all the way around the corner. This allowed [place] to dislocate the humeral head.   The humeral head had evidence of severe osteoarthritic wear with full-thickness cartilage loss and exposed subchondral bone. There was no significant flattening of the humeral head.   The rotator cuff was carefully examined and noted to be irreperably torn.  The decision was confirmed that a reverse total shoulder was indicated for this patient.  There were osteophytes along the inferior humeral neck. The osteophytes were removed with an osteotome and a rongeur.  Osteophytes were removed with a rongeur and an osteotome and the anatomic neck was well visualized.     A humeral cutting guide was inserted down the intramedullary canal. The version was set at 20 of retroversion. Humeral osteotomy was performed with an oscillating saw. The head fragment was passed off the back table. We noted that the inferior portion of the calcar appeared thin and we are worried that it would compromise her fixation.  We thus decided a longstem was appropriate.  A starter awl was used to open the humeral canal. We next used T-handle straight sound reamers to ream up to an appropriate fit. A chisel was used to remove proximal humeral bone. We then broached starting with a size one broach and broaching up to 2 Long which obtained an appropriate fit. The broach handle was removed. A cut protector was placed. The broach handle was removed and a cut protector was placed. The humerus was retracted posteriorly and we turned our attention to glenoid  exposure.  The subscapularis was again identified and immediately we took care to palpate the axillary nerve anteriorly and verify its position with gentle palpation as well as the tug test.  We then released the SGHL with bovie cautery prior to placing a curved mayo at the junction of the anterior glenoid well above the axillary nerve and bluntly dissecting the subscapularis from the capsule.  We then carefully protected the axillary nerve as we gently released the inferior capsule to fully mobilize the subscapularis.  An anterior deltoid retractor was then placed as well as a small Hohmann retractor superiorly.   The glenoid was inspected and had evidence of severe osteoarthritic wear with full-thickness cartilage loss and exposed subchondral bone. The remaining labrum was removed circumferentially taking great care not to disrupt the posterior capsule.   The glenoid drill guide was placed and used to drill a guide pin in the center, inferior position. The glenoid face was then reamed concentrically over the guide wire. The center hole was drilled over the guidepin in a near anatomic angle of version. Next the  glenoid vault was drilled back to a depth of 40 mm.  We tapped and then placed a 33m size baseplate with 274mlateralization was selected with a 40 mm x 6.5 mm length central screw.  The base plate was screwed into the glenoid vault obtaining secure fixation. We next placed superior and inferior locking screws  for additional fixation.  Next a 36 mm glenosphere was selected and impacted onto the baseplate. The center screw was tightened.  We turned attention back to the humeral side. The cut protector was removed. We trialed with multiple size tray and polyethylene options and selected a 6 which provided good stability and range of motion without excess soft tissue tension. The offset was dialed in to match the normal anatomy. The shoulder was trialed.  There was good ROM in all planes and the  shoulder was stable with no inferior translation.  The real humeral implants were opened after again confirming sizes.  The trial was removed. #5 Fiberwire x4 sutures passed through the humeral neck for subscap repair. The humeral component was press-fit obtaining a secure fit. A +0 high offset tray was selected and impacted onto the stem.  A 36+6 polyethylene liner was impacted onto the stem.  The joint was reduced and thoroughly irrigated with pulsatile lavage. Subscap was repaired back with #5 Fiberwire sutures through bone tunnels. Hemostasis was obtained. The deltopectoral interval was reapproximated with #1 Ethibond. The subcutaneous tissues were closed with 2-0 Vicryl and the skin was closed with running monocryl.    The wounds were cleaned and dried and an Aquacel dressing was placed. The drapes taken down. The arm was placed into sling with abduction pillow. Patient was awakened, extubated, and transferred to the recovery room in stable condition. There were no intraoperative complications. The sponge, needle, and attention counts were  correct at the end of the case.        [name], [name], present and scrubbed throughout the case, critical for completion in a timely fashion, and for retraction, instrumentation, closure.

## 2019-04-03 NOTE — Transfer of Care (Signed)
Immediate Anesthesia Transfer of Care Note  Patient: Terri Douglas  Procedure(s) Performed: REVERSE SHOULDER ARTHROPLASTY (Left Shoulder)  Patient Location: PACU  Anesthesia Type:General  Level of Consciousness: awake, alert  and oriented  Airway & Oxygen Therapy: Patient Spontanous Breathing and Patient connected to face mask oxygen  Post-op Assessment: Report given to RN and Post -op Vital signs reviewed and stable  Post vital signs: Reviewed and stable  Last Vitals:  Vitals Value Taken Time  BP    Temp    Pulse 74 04/03/19 1147  Resp 13 04/03/19 1147  SpO2 100 % 04/03/19 1147  Vitals shown include unvalidated device data.  Last Pain:  Vitals:   04/03/19 0812  TempSrc:   PainSc: 4       Patients Stated Pain Goal: 3 ([id number])  Complications: No apparent anesthesia complications

## 2019-04-03 NOTE — Plan of Care (Signed)
Plan of care 

## 2019-04-03 NOTE — Progress Notes (Signed)
Assisted Dr. Hodierne with left, ultrasound guided, interscalene  block. Side rails up, monitors on throughout procedure. See vital signs in flow sheet. Tolerated Procedure well. 

## 2019-04-03 NOTE — Anesthesia Procedure Notes (Signed)
Anesthesia Regional Block: Interscalene brachial plexus block   Pre-Anesthetic Checklist: ,, timeout performed, Correct Patient, Correct Site, Correct Laterality, Correct Procedure, Correct Position, site marked, Risks and benefits discussed,  Surgical consent,  Pre-op evaluation,  At surgeon's request and post-op pain management  Laterality: Left  Prep: chloraprep       Needles:  Injection technique: Single-shot  Needle Type: Echogenic Stimulator Needle     Needle Length: 5cm  Needle Gauge: 22     Additional Needles:   Procedures:, nerve stimulator,,,,,,,   Nerve Stimulator or Paresthesia:  Response: biceps flexion, 0.45 mA,   Additional Responses:   Narrative:  Start time: 04/03/2019 9:03 AM End time: 04/03/2019 9:11 AM Injection made incrementally with aspirations every 5 mL.  Performed by: Personally  Anesthesiologist: Albertha Ghee, MD  Additional Notes: Functioning IV was confirmed and monitors were applied.  A 87mm 22ga Arrow echogenic stimulator needle was used. Sterile prep and drape,hand hygiene and sterile gloves were used.  Negative aspiration and negative test dose prior to incremental administration of local anesthetic. The patient tolerated the procedure well.  Ultrasound guidance: relevent anatomy identified, needle position confirmed, local anesthetic spread visualized around nerve(s), vascular puncture avoided.  Image printed for medical record.

## 2019-04-03 NOTE — Anesthesia Procedure Notes (Signed)
Procedure Name: Intubation Date/Time: 04/03/2019 9:59 AM Performed by: Maxwell Caul, CRNA Pre-anesthesia Checklist: Patient identified, Emergency Drugs available, Suction available and Patient being monitored Patient Re-evaluated:Patient Re-evaluated prior to induction Oxygen Delivery Method: Circle system utilized Preoxygenation: Pre-oxygenation with 100% oxygen Induction Type: IV induction Ventilation: Mask ventilation without difficulty Laryngoscope Size: Mac and 3 Grade View: Grade I Tube type: Oral Number of attempts: 1 Airway Equipment and Method: Stylet Placement Confirmation: ETT inserted through vocal cords under direct vision,  positive ETCO2 and breath sounds checked- equal and bilateral Secured at: 21 cm Tube secured with: Tape Dental Injury: Teeth and Oropharynx as per pre-operative assessment

## 2019-04-03 NOTE — Anesthesia Preprocedure Evaluation (Signed)
Anesthesia Evaluation  Patient identified by MRN, date of birth, ID band Patient awake    Reviewed: Allergy & Precautions, H&P , NPO status , Patient's Chart, lab work & pertinent test results  History of Anesthesia Complications (+) PONV and history of anesthetic complications  Airway Mallampati: II   Neck ROM: full    Dental   Pulmonary neg pulmonary ROS,    breath sounds clear to auscultation       Cardiovascular hypertension,  Rhythm:regular Rate:Normal     Neuro/Psych    GI/Hepatic GERD  ,  Endo/Other    Renal/GU      Musculoskeletal  (+) Arthritis ,   Abdominal   Peds  Hematology   Anesthesia Other Findings   Reproductive/Obstetrics                             Anesthesia Physical Anesthesia Plan  ASA: II  Anesthesia Plan: General   Post-op Pain Management:  Regional for Post-op pain   Induction: Intravenous  PONV Risk Score and Plan: 4 or greater and Ondansetron, Dexamethasone, Midazolam, Scopolamine patch - Pre-op and Treatment may vary due to age or medical condition  Airway Management Planned: Oral ETT  Additional Equipment:   Intra-op Plan:   Post-operative Plan: Extubation in OR  Informed Consent: I have reviewed the patients History and Physical, chart, labs and discussed the procedure including the risks, benefits and alternatives for the proposed anesthesia with the patient or authorized representative who has indicated his/her understanding and acceptance.       Plan Discussed with: CRNA, Anesthesiologist and Surgeon  Anesthesia Plan Comments:         Anesthesia Quick Evaluation

## 2019-04-03 NOTE — Interval H&P Note (Signed)
History and Physical Interval Note:  04/03/2019 9:03 AM  Terri Douglas  has presented today for surgery, with the diagnosis of LEFT SHOULDER DJD.  The various methods of treatment have been discussed with the patient and family. After consideration of risks, benefits and other options for treatment, the patient has consented to  Procedure(s): REVERSE SHOULDER ARTHROPLASTY (Left) as a surgical intervention.  The patient's history has been reviewed, patient examined, no change in status, stable for surgery.  I have reviewed the patient's chart and labs.  Questions were answered to the patient's satisfaction.     Hiram Gash

## 2019-04-04 ENCOUNTER — Encounter (HOSPITAL_COMMUNITY): Payer: Self-pay | Admitting: Orthopaedic Surgery

## 2019-04-04 MED ORDER — ONDANSETRON HCL 4 MG PO TABS: 4.0000 mg | ORAL_TABLET | Freq: Three times a day (TID) | ORAL | 1 refills | Status: AC | PRN

## 2019-04-04 MED ORDER — CELECOXIB 200 MG PO CAPS: 200.0000 mg | ORAL_CAPSULE | Freq: Two times a day (BID) | ORAL | 1 refills | Status: AC

## 2019-04-04 MED ORDER — ACETAMINOPHEN 500 MG PO TABS: 1000.0000 mg | ORAL_TABLET | Freq: Three times a day (TID) | ORAL | 0 refills | Status: AC

## 2019-04-04 MED ORDER — OXYCODONE HCL 5 MG PO TABS: ORAL_TABLET | ORAL | 0 refills | Status: AC

## 2019-04-04 NOTE — Plan of Care (Signed)
Plan of care reviewed and discussed with the patient. 

## 2019-04-04 NOTE — Discharge Summary (Signed)
Patient ID: KINYATA HOSEK MRN: [id number] DOB/AGE: February 15, 1946 73 y.o.  Admit date: 04/03/2019 Discharge date: 04/04/2019  Admission Diagnoses: Left rotator cuff tear and arthritis  Discharge Diagnoses:  Active Problems:   Degenerative arthritis of left shoulder region   Past Medical History:  Diagnosis Date  . Arthritis   . Complication of anesthesia   . Gall stones   . GERD (gastroesophageal reflux disease)   . History of cardiac catheterization 2005   Minor elevation in cardiac enzymes however no significant obstructive CAD  . Hyperlipidemia   . Hypertension   . Insomnia   . PONV (postoperative nausea and vomiting)   . Prediabetes     Procedures Performed: Left reverse total Shoulder Arthroplasty  Discharged Condition: good/stable  Hospital Course: Patient presented to [facility] on 04/03/2019 for a left reverse total shoulder arthroplasty.  She tolerated procedure well. She was kept overnight for pain control,OT, prophylactic antibiotics, and discharge planning. She was found to be stable for DC home the morning after surgery.  Patient was instructed on specific activity restrictions and all questions were answered.   Consults: OT  Significant Diagnostic Studies: No additional pertinent studies  Treatments:  Left reverse total Shoulder Arthroplasty  Discharge Exam: Left shoulder: Dressing CDI and sling well fitting,  full and painless ROM throughout hand with DPC of 0. + Motor in  AIN, PIN, Ulnar distributions. Axillary nerve sensation preserved and symmetric.  Sensation intact in medial, radial, and ulnar distributions. Well perfused digits.   Disposition: Discharge disposition: 01-Home or Self Care     Post-operative plan: NWB in sling Discharge meds: Tylenol, Celebrex, Oxycodone, Zofran Follow-up: 1 week in office for incision check and XR PT: Start after 4 weeks  Discharge Instructions    Call MD for:  redness, tenderness, or signs of  infection (pain, swelling, redness, odor or green/yellow discharge around incision site)   Complete by: As directed    Call MD for:  severe uncontrolled pain   Complete by: As directed    Call MD for:  temperature >100.4   Complete by: As directed    Diet - low sodium heart healthy   Complete by: As directed    Discharge instructions   Complete by: As directed    Ophelia Charter MD, MPH [facility] [address], [address] [phone] (tel)   [phone] (fax)   Orestes may leave the operative dressing in place until your follow-up appointment.  KEEP THE INCISIONS CLEAN AND DRY.  Use the provided ice machine or Ice packs as often as possible for the first 3-4 days, then as needed for pain relief.  Keep a layer of cloth or a shirt between your skin and the cooling unit to prevent frost bite as it can get very cold.  You may shower on Post-Op Day #2. The dressing is water resistant but do not scrub it as it may start to peel up.  You may remove the sling for showering, but keep a water resistant pillow under the arm to keep both the elbow and shoulder away from the body (mimicking the abduction sling). Gently pat the area dry. Do not soak the shoulder in water. Do not go swimming in the pool or ocean until your sutures are removed.  EXERCISES Wear the sling at all times except when doing your exercises. You may remove the sling for showering, but keep the arm across the  chest or in a secondary sling.     Accidental/Purposeful External Rotation and shoulder flexion (reaching behind you) is to be avoided at all costs for the first month.  Please perform the exercises:   Elbow / Hand / Wrist  Range of Motion Exercises  FOLLOW-UP If you develop a Fever (>101.5), Redness or Drainage from the surgical incision site, please call our office to arrange for an evaluation. Please call the office to  schedule a follow-up appointment for a wound check, 7-10 days post-operatively.    IF YOU HAVE ANY QUESTIONS, PLEASE FEEL FREE TO CALL OUR OFFICE.   HELPFUL INFORMATION  Your arm will be in a sling following surgery. You will be in this sling for the next 3-4 weeks.  I will let you know the exact duration at your follow-up visit.  You may be more comfortable sleeping in a semi-seated position the first few nights following surgery.  Keep a pillow propped under the elbow and forearm for comfort.  If you have a recliner type of chair it might be beneficial.  If not that is fine too, but it would be helpful to sleep propped up with pillows behind your operated shoulder as well under your elbow and forearm.  This will reduce pulling on the suture lines.  We suggest you use the pain medication the first night prior to going to bed, in order to ease any pain when the anesthesia wears off. You should avoid taking pain medications on an empty stomach as it will make you nauseous.  Do not drink alcoholic beverages or take illicit drugs when taking pain medications.  In most states it is against the law to drive while your arm is in a sling. And certainly against the law to drive while taking narcotics.  You may return to work/school in the next couple of days when you feel up to it. Desk work and typing in the sling is fine.  When dressing, put your operative arm in the sleeve first.  When getting undressed, take your operative arm out last.  Loose fitting, button-down shirts are recommended.  Pain medication may make you constipated.  Below are a few solutions to try in this order: Decrease the amount of pain medication if you aren't having pain. Drink lots of decaffeinated fluids. Drink prune juice and/or each dried prunes  If the first 3 don't work start with additional solutions Take Colace - an over-the-counter stool softener Take Senokot - an over-the-counter laxative Take Miralax - a  stronger over-the-counter laxative     Allergies as of 04/04/2019      Reactions   Ibuprofen Hives   Phenobarbital Other (See Comments)   Causes blurred vision      Medication List    TAKE these medications   acetaminophen 500 MG tablet Commonly known as: TYLENOL Take 2 tablets (1,000 mg total) by mouth every 8 (eight) hours for 14 days.   allopurinol 100 MG tablet Commonly known as: ZYLOPRIM Take 100 mg by mouth daily.   ALPRAZolam 1 MG tablet Commonly known as: XANAX TAKE ONE TABLET AT BEDTIME AS NEEDED What changed:   how much to take  how to take this  when to take this   Biotin 5000 MCG Tabs Take 5,000 mcg by mouth daily.   celecoxib 200 MG capsule Commonly known as: CeleBREX Take 1 capsule (200 mg total) by mouth 2 (two) times daily.   docusate sodium 100 MG capsule Commonly known as: COLACE Take 200  mg by mouth at bedtime.   estradiol 2 MG tablet Commonly known as: ESTRACE Take 2 mg by mouth daily.   finasteride 5 MG tablet Commonly known as: PROSCAR Take 2.5 mg by mouth daily.   Laxative 25 MG Tabs Generic drug: Sennosides Take 25 mg by mouth daily as needed (constipation.).   Lubricant Eye Drops 0.4-0.3 % Soln Generic drug: Polyethyl Glycol-Propyl Glycol Place 1-2 drops into both eyes daily as needed (dry/irritated eyes.).   minoxidil 2 % external solution Commonly known as: ROGAINE Apply 1 application topically at bedtime.   ondansetron 4 MG tablet Commonly known as: Zofran Take 1 tablet (4 mg total) by mouth every 8 (eight) hours as needed for up to 7 days for nausea or vomiting.   oxyCODONE 5 MG immediate release tablet Commonly known as: Oxy IR/ROXICODONE Take 1-2 pills every 6 hrs as needed for pain, no more than 6 per day   PHILLIPS COLON HEALTH PO Take 1 capsule by mouth daily.   potassium chloride 10 MEQ tablet Commonly known as: KLOR-CON Take 1 tablet (10 mEq total) by mouth 2 (two) times daily. Pt needs to call and make  appt for further refills - 1st attempt What changed:   how much to take  when to take this   pravastatin 80 MG tablet Commonly known as: PRAVACHOL 1 qday What changed:   how much to take  how to take this  when to take this  additional instructions   sertraline 50 MG tablet Commonly known as: ZOLOFT TAKE ONE TABLET (50MG  TOTAL) BY MOUTH DAILY What changed:   how much to take  how to take this  when to take this  additional instructions   torsemide 20 MG tablet Commonly known as: DEMADEX Take 1 tablet (20 mg total) by mouth every morning.

## 2019-04-04 NOTE — Evaluation (Signed)
Occupational Therapy Evaluation Patient Details Name: Terri Douglas MRN: [id number] DOB: June 17, 1945 Today's Date: 04/04/2019    History of Present Illness s/p L RTSA.  H/o neck surgeries   Clinical Impression   73 year old female was admitted for the above sx. All education was completed with her, but will further educate her husband, when he arrives so that he feels comfortable assisting with adls and donning sling    Follow Up Recommendations  Follow surgeon's recommendation for DC plan and follow-up therapies    Equipment Recommendations  None recommended by OT    Recommendations for Other Services       Precautions / Restrictions Precautions Precautions: Shoulder Type of Shoulder Precautions: sling on at all times except for bathing, dressing and exercises. May move elbow wrist and finger Restrictions LLE Weight Bearing: Non weight bearing      Mobility Bed Mobility               General bed mobility comments: min A for OOB  Transfers Overall transfer level: Independent                    Balance                                           ADL either performed or assessed with clinical judgement   ADL Overall ADL's : Needs assistance/impaired Eating/Feeding: Set up   Grooming: Set up   Upper Body Bathing: Moderate assistance   Lower Body Bathing: Moderate assistance   Upper Body Dressing : Moderate assistance;Maximal assistance   Lower Body Dressing: Maximal assistance   Toilet Transfer: Supervision/safety;Ambulation;Comfort height toilet             General ADL Comments: Performed ADL and educated on protocol.  Pt is able to move fingers     Vision         Perception     Praxis      Pertinent Vitals/Pain Pain Assessment: No/denies pain     Hand Dominance Right   Extremity/Trunk Assessment Upper Extremity Assessment Upper Extremity Assessment: LUE deficits/detail(block in effect. Able to wiggle  fingers)           Communication Communication Communication: No difficulties   Cognition Arousal/Alertness: Awake/alert Behavior During Therapy: WFL for tasks assessed/performed Overall Cognitive Status: Within Functional Limits for tasks assessed                                     General Comments       Exercises     Shoulder Instructions      Home Living Family/patient expects to be discharged to:: Private residence Living Arrangements: Spouse/significant other                                      Prior Functioning/Environment Level of Independence: Independent                 OT Problem List: Decreased knowledge of precautions;Impaired UE functional use      OT Treatment/Interventions: Self-care/ADL training;DME and/or AE instruction;Patient/family education    OT Goals(Current goals can be found in the care plan section) Acute Rehab OT Goals Patient Stated Goal: none stated OT  Goal Formulation: With patient Time For Goal Achievement: 04/05/19 Potential to Achieve Goals: Good ADL Goals Additional ADL Goal #1: caregiver will don sling with cues and verbalize needed assistance for adls/understanding of precautions and protocol  OT Frequency: Min 2X/week   Barriers to D/C:            Co-evaluation              AM-PAC OT "6 Clicks" Daily Activity     Outcome Measure Help from another person eating meals?: A Little Help from another person taking care of personal grooming?: A Little Help from another person toileting, which includes using toliet, bedpan, or urinal?: A Little Help from another person bathing (including washing, rinsing, drying)?: A Lot Help from another person to put on and taking off regular upper body clothing?: A Lot Help from another person to put on and taking off regular lower body clothing?: A Lot 6 Click Score: 15   End of Session    Activity Tolerance: Patient tolerated treatment  well Patient left: in chair;with call bell/phone within reach  OT Visit Diagnosis: Muscle weakness (generalized) (M62.81)                Time: [id number] OT Time Calculation (min): 42 min Charges:  OT General Charges $OT Visit: 1 Visit OT Evaluation $OT Eval Low Complexity: 1 Low OT Treatments $Self Care/Home Management : 23-37 mins  Lesle Chris, OTR/L Acute Rehabilitation Services [phone] [facility] pager [phone] office 04/04/2019  [place] [facility] 04/04/2019, 9:21 AM

## 2019-04-04 NOTE — Progress Notes (Signed)
   04/04/19 1000  OT Visit Information  Last OT Received On 04/04/19  Assistance Needed +1  History of Present Illness s/p L RTSA.  H/o neck surgeries  Precautions  Precautions Shoulder  Type of Shoulder Precautions sling on at all times except for bathing, dressing and exercises. May move elbow wrist and finger  Pain Assessment  Pain Assessment No/denies pain  Cognition  Arousal/Alertness Awake/alert  Behavior During Therapy WFL for tasks assessed/performed  Overall Cognitive Status Within Functional Limits for tasks assessed  ADL  General ADL Comments Husband present for education.  Donned sling (trough twice).  OT demonstrated donning shirt and washing under arm without moving. Reviewed exercises. Pt is able to move wrist and fingers at this time.  Husband verbalizes understanding and pt states she knows how sling should go.  Sling is proper length, angle tends to be slightly greater than 90.  Pillow helps support. Pt has had 4 neck sxs.  Restrictions  LLE Weight Bearing NWB  Transfers  Overall transfer level Independent  OT - End of Session  Activity Tolerance Patient tolerated treatment well  Patient left in chair;with call bell/phone within reach  OT Assessment/Plan  Follow Up Recommendations Follow surgeon's recommendation for DC plan and follow-up therapies  OT Equipment None recommended by OT  AM-PAC OT "6 Clicks" Daily Activity Outcome Measure (Version 2)  Help from another person eating meals? 3  Help from another person taking care of personal grooming? 3  Help from another person toileting, which includes using toliet, bedpan, or urinal? 3  Help from another person bathing (including washing, rinsing, drying)? 2  Help from another person to put on and taking off regular upper body clothing? 2  Help from another person to put on and taking off regular lower body clothing? 2  6 Click Score 15  OT Goal Progression  Progress towards OT goals Goals met/education completed,  patient discharged from OT  OT Time Calculation  OT Start Time (ACUTE ONLY) 0940  OT Stop Time (ACUTE ONLY) 1012  OT Time Calculation (min) 32 min  OT General Charges  $OT Visit 1 Visit  OT Treatments  $Self Care/Home Management  23-37 mins  Lesle Chris, OTR/L Acute Rehabilitation Services [phone] [facility] pager [phone] office 04/04/2019

## 2019-04-04 NOTE — Progress Notes (Signed)
Pt was provided d/c instructions. After discussing the pt's plan of care upon d/c home, pt reported no further questions or concerns.

## 2019-04-05 NOTE — Anesthesia Postprocedure Evaluation (Signed)
Anesthesia Post Note  Patient: Terri Douglas  Procedure(s) Performed: REVERSE SHOULDER ARTHROPLASTY (Left Shoulder)     Patient location during evaluation: PACU Anesthesia Type: General Level of consciousness: awake and alert Pain management: pain level controlled Vital Signs Assessment: post-procedure vital signs reviewed and stable Respiratory status: spontaneous breathing, nonlabored ventilation, respiratory function stable and patient connected to nasal cannula oxygen Cardiovascular status: blood pressure returned to baseline and stable Postop Assessment: no apparent nausea or vomiting Anesthetic complications: no    Last Vitals:  Vitals:   04/04/19 0107 04/04/19 0524  BP: (!) 102/56 (!) 102/58  Pulse: 67 61  Resp: 16 14  Temp: 36.9 C 36.8 C  SpO2: 95% 93%    Last Pain:  Vitals:   04/04/19 0800  TempSrc:   PainSc: 0-No pain                 Tonie Elsey S

## 2019-04-09 ENCOUNTER — Encounter

## 2019-04-11 DIAGNOSIS — M19012 Primary osteoarthritis, left shoulder: Secondary | ICD-10-CM | POA: Diagnosis not present

## 2019-04-30 ENCOUNTER — Encounter

## 2019-04-30 ENCOUNTER — Inpatient Hospital Stay: Admit: 2019-04-30 | Payer: MEDICARE | Primary: Physician Assistant

## 2019-04-30 DIAGNOSIS — E039 Hypothyroidism, unspecified: Secondary | ICD-10-CM

## 2019-04-30 LAB — TSH 3RD GENERATION
TSH: 0.96 u[IU]/mL (ref 0.36–3.74)
TSH: 0.96 u[IU]/mL (ref 0.36–3.74)

## 2019-04-30 LAB — T4, FREE
T4 Free: 1.4 NG/DL (ref 0.7–1.5)
T4, Free: 1.4 NG/DL (ref 0.7–1.5)

## 2019-05-07 DIAGNOSIS — M19012 Primary osteoarthritis, left shoulder: Secondary | ICD-10-CM | POA: Diagnosis not present

## 2019-05-15 ENCOUNTER — Ambulatory Visit (HOSPITAL_COMMUNITY): Payer: Medicare Other | Attending: Physician Assistant | Admitting: Occupational Therapy

## 2019-05-15 ENCOUNTER — Encounter (HOSPITAL_COMMUNITY): Payer: Self-pay | Admitting: Occupational Therapy

## 2019-05-15 ENCOUNTER — Other Ambulatory Visit: Payer: Self-pay

## 2019-05-15 DIAGNOSIS — R29898 Other symptoms and signs involving the musculoskeletal system: Secondary | ICD-10-CM

## 2019-05-15 DIAGNOSIS — M25512 Pain in left shoulder: Secondary | ICD-10-CM | POA: Insufficient documentation

## 2019-05-15 DIAGNOSIS — M25612 Stiffness of left shoulder, not elsewhere classified: Secondary | ICD-10-CM | POA: Diagnosis present

## 2019-05-15 NOTE — Therapy (Addendum)
Terri Douglas, [place], [address] Phone: [phone]   Fax:  [phone]  Occupational Therapy Evaluation  Patient Details  Name: Terri Douglas MRN: [id number] Date of Birth: [birth year] Referring Provider (OT): [name], [name] (Surgeon-[name])   Encounter Date: 05/15/2019  OT End of Session - 05/15/19 1152    Visit Number  1    Number of Visits  16    Date for OT Re-Evaluation  07/14/19   mini-reassessment 06/12/2019   Authorization Type  1) Medicare A & B; BCBS Supplement    Authorization Time Period  progress note by 10th visit    Authorization - Visit Number  1    Authorization - Number of Visits  10    OT Start Time  [id number]    OT Stop Time  1147    OT Time Calculation (min)  31 min    Activity Tolerance  Patient tolerated treatment well    Behavior During Therapy  [facility] for tasks assessed/performed       Past Medical History:  Diagnosis Date  . Arthritis   . Complication of anesthesia   . Gall stones   . GERD (gastroesophageal reflux disease)   . History of cardiac catheterization 2005   Minor elevation in cardiac enzymes however no significant obstructive CAD  . Hyperlipidemia   . Hypertension   . Insomnia   . PONV (postoperative nausea and vomiting)   . Prediabetes     Past Surgical History:  Procedure Laterality Date  . A-FLUTTER ABLATION N/A 08/01/2017   Procedure: A-FLUTTER ABLATION;  Surgeon: Thompson Grayer, MD;  Location: [place] [facility];  Service: Cardiovascular;  Laterality: N/A;  . ABDOMINAL HYSTERECTOMY    . BACK SURGERY  1992  . CARDIAC CATHETERIZATION     BACK IN 2004  SHE THINKS IT CAME BACK 'NORMAL'  . CHOLECYSTECTOMY N/A 06/24/2015   Procedure: LAPAROSCOPIC CHOLECYSTECTOMY;  Surgeon: Mickeal Skinner, MD;  Location: [place];  Service: General;  Laterality: N/A;  . COLONOSCOPY N/A 08/30/2012   Procedure: COLONOSCOPY;  Surgeon: Rogene Houston, MD;  [facility];  Service: Endoscopy;  Laterality: N/A;  830  . DILATION AND CURETTAGE OF UTERUS    . ESOPHAGOGASTRODUODENOSCOPY  04/15/2011   Procedure: ESOPHAGOGASTRODUODENOSCOPY (EGD);  Surgeon: Rogene Houston, MD;  [facility];  Service: Endoscopy;  Laterality: N/A;  11:30  . ESOPHAGOGASTRODUODENOSCOPY N/A 05/27/2016   Procedure: ESOPHAGOGASTRODUODENOSCOPY (EGD);  Surgeon: Rogene Houston, MD;  Location[facility];  Service: Endoscopy;  Laterality: N/A;  11:15  . EYE SURGERY     cataract removal  . NECK SURGERY  12/13/2018  . REVERSE SHOULDER ARTHROPLASTY Left 04/03/2019   Procedure: REVERSE SHOULDER ARTHROPLASTY;  Surgeon: Hiram Gash, MD;  Location: [facility];  Service: Orthopedics;  Laterality: Left;  Marland Kitchen VAGINAL HYSTERECTOMY      There were no vitals filed for this visit.  Subjective Assessment - 05/15/19 1150    Subjective   S: It's still swollen some, my shirts are tight.    Pertinent History  Pt is a 73 y/o female s/p left reverse TSA on 04/03/2019 presenting with functional deficits in LUE use. Pt was referred to occupational therapy for evaluation and treatment by [name], [name]. Surgeon is [name].    Special Tests  FOTO: 42/100    Patient Stated Goals  To be able to use my arm    Currently in  Pain?  No/denies        [facility] OT Assessment - 05/15/19 1115      Assessment   Medical Diagnosis  s/p left reverse TSA    Referring Provider (OT)  [name], [name]   Surgeon-[name]   Onset Date/Surgical Date  04/03/19    Hand Dominance  Right    Prior Therapy  acute OT      Precautions   Precautions  Shoulder    Type of Shoulder Precautions  See protocol       Balance Screen   Has the patient fallen in the past 6 months  No      Prior Function   Level of Independence  Independent    Vocation  Retired    Leisure  fishing, Nurse, mental health, reading      ADL   ADL comments  Pt is having difficulty with dressing, bathing, reaching up and behind  back. Pt is unable to lift items with LUE. Pt has difficulty sleeping.       Written Expression   Dominant Hand  Left      Cognition   Overall Cognitive Status  History of cognitive impairments - at baseline      Observation/Other Assessments   Focus on Therapeutic Outcomes (FOTO)   42/100      ROM / Strength   AROM / PROM / Strength  AROM;PROM;Strength      Palpation   Palpation comment  mod fascial restrictions along left upper arm, trapezius, and scapular regions      AROM   Overall AROM Comments  Assessed seated, er/IR adducted    AROM Assessment Site  Shoulder    Right/Left Shoulder  Left    Left Shoulder Flexion  91 Degrees    Left Shoulder ABduction  62 Degrees    Left Shoulder Internal Rotation  90 Degrees    Left Shoulder External Rotation  20 Degrees      PROM   Overall PROM Comments  Assessed supine, er/IR adducted    PROM Assessment Site  Shoulder    Right/Left Shoulder  Left    Left Shoulder Flexion  96 Degrees    Left Shoulder ABduction  72 Degrees    Left Shoulder Internal Rotation  90 Degrees    Left Shoulder External Rotation  20 Degrees      Strength   Overall Strength Comments  Assessed seated, er/IR adducted    Strength Assessment Site  Shoulder    Right/Left Shoulder  Left    Left Shoulder Flexion  3-/5    Left Shoulder ABduction  3-/5    Left Shoulder Internal Rotation  3/5    Left Shoulder External Rotation  3-/5                      OT Education - 05/15/19 1139    Education Details  table slides    Person(s) Educated  Patient    Methods  Explanation;Demonstration;Handout    Comprehension  Verbalized understanding;Returned demonstration       OT Short Term Goals - 05/15/19 1350      OT SHORT TERM GOAL #1   Title  Pt will be provided with and educated on HEP to improve mobility in LUE required for ADL completion.    Time  4    Period  Weeks    Status  New    Target Date  06/14/19      OT SHORT TERM GOAL #  2   Title   Pt will increase LUE P/ROM to [facility] to improve ability to use LUE as assist with dressing tasks.    Time  4    Period  Weeks    Status  New      OT SHORT TERM GOAL #3   Title  Pt will increase LUE strength to 4-/5 to improve ability to use LUE to reach items above shoulder height.    Time  4    Period  Weeks    Status  New        OT Long Term Goals - 05/15/19 1400      OT LONG TERM GOAL #1   Title  Pt will return to highest level of functioning using LUE as non-dominant during ADL completion.    Time  8    Period  Weeks    Status  New    Target Date  07/14/19      OT LONG TERM GOAL #2   Title  Pt will decrease LUE pain to 3/10 or less to improve ability to sleep for 4 consecutive hours or more at night.    Time  8    Period  Weeks    Status  New      OT LONG TERM GOAL #3   Title  Pt will decrease LUE fascial restrictions to min amounts or less to improve mobility required for functional reaching tasks.    Time  8    Period  Weeks    Status  New      OT LONG TERM GOAL #4   Title  Pt will increase A/ROM in LUE to [facility] to improve ability to reach into overhead cabinets and behind back.    Time  2    Period  Weeks    Status  New      OT LONG TERM GOAL #5   Title  Pt will increase LUE strength to 4+/5 or greater to improve ability to fish with LUE.    Time  8    Period  Weeks    Status  New            Plan - 05/15/19 1153    Clinical Impression Statement  A: Pt is a 73 y/o female s/p left reverse TSA on 04/03/19 presenting with deficits in functional use of LUE as non-dominant during daily tasks. Pt reports continued swelling, noting her shirts are tighter around her left upper arm.    OT Occupational Profile and History  Problem Focused Assessment - Including review of records relating to presenting problem    Occupational performance deficits (Please refer to evaluation for details):  ADL's;IADL's;Rest and Sleep;Leisure    Body Structure / Function / Physical  Skills  ADL;Endurance;UE functional use;Fascial restriction;Pain;ROM;IADL;Strength    Rehab Potential  Good    Clinical Decision Making  Limited treatment options, no task modification necessary    Comorbidities Affecting Occupational Performance:  None    Modification or Assistance to Complete Evaluation   No modification of tasks or assist necessary to complete eval    OT Frequency  2x / week    OT Duration  8 weeks    OT Treatment/Interventions  Self-care/ADL training;Ultrasound;Patient/family education;Passive range of motion;Cryotherapy;Electrical Stimulation;Moist Heat;Therapeutic exercise;Manual Therapy;Therapeutic activities    Plan  P: Pt will benefit from skilled OT services to decrease pain and fascial restrictions and increase ROM, strength, and functional use of LUE during ADL tasks. Treatment plan: myofascial release,  manual techniques, P/ROM, AA/ROM, A/ROM, general LUE strengthening, scapular stability and strengthening, modalities prn    Consulted and Agree with Plan of Care  Patient       Patient will benefit from skilled therapeutic intervention in order to improve the following deficits and impairments:   Body Structure / Function / Physical Skills: ADL, Endurance, UE functional use, Fascial restriction, Pain, ROM, IADL, Strength       Visit Diagnosis: Acute pain of left shoulder - Plan: Ot plan of care cert/re-cert  Stiffness of left shoulder, not elsewhere classified - Plan: Ot plan of care cert/re-cert  Other symptoms and signs involving the musculoskeletal system - Plan: Ot plan of care cert/re-cert    Problem List Patient Active Problem List   Diagnosis Date Noted  . 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  . Insomnia 03/15/2019  . Abdominal pain, epigastric 05/09/2016  . Belching 05/09/2016  . Hyperlipidemia 06/18/2014  . Hyperglycemia 06/18/2014  . Gout 06/18/2014  . Osteopenia 10/28/2013  . Right foot pain 01/31/2013  . Hypertension  03/29/2011  . High cholesterol 03/29/2011   Guadelupe Sabin, OTR/L  [phone] 05/15/2019, 5:42 PM  [facility] [address] [place], [place], [address] Phone: [phone]   Fax:  [phone]  Name: DELINAH HUMBERT MRN: [id number] Date of Birth: Mar 03, 1946

## 2019-05-15 NOTE — Patient Instructions (Signed)
SHOULDER: Flexion On Table   Place hands on towel placed on table, elbows straight. Lean forward with you upper body, pushing towel away from body.  _15__ reps per set, __3_ sets per day  Abduction (Passive)   With arm out to side, resting on towel placed on table with palm DOWN, keeping trunk away from table, lean to the side while pushing towel away from body.  Repeat __15__ times. Do __3__ sessions per day.  Copyright  VHI. All rights reserved.     Internal Rotation (Assistive)   Seated with elbow bent at right angle and held against side, slide arm on table surface in an inward arc keeping elbow anchored in place. Repeat __15__ times. Do _3___ sessions per day. Activity: Use this motion to brush crumbs off the table.  Copyright  VHI. All rights reserved.    

## 2019-05-16 ENCOUNTER — Ambulatory Visit (HOSPITAL_COMMUNITY): Payer: Medicare Other | Admitting: Occupational Therapy

## 2019-05-16 ENCOUNTER — Encounter (HOSPITAL_COMMUNITY): Payer: Self-pay | Admitting: Occupational Therapy

## 2019-05-16 DIAGNOSIS — M25612 Stiffness of left shoulder, not elsewhere classified: Secondary | ICD-10-CM

## 2019-05-16 DIAGNOSIS — M25512 Pain in left shoulder: Secondary | ICD-10-CM

## 2019-05-16 DIAGNOSIS — R29898 Other symptoms and signs involving the musculoskeletal system: Secondary | ICD-10-CM

## 2019-05-16 NOTE — Therapy (Signed)
[place], [place], [address] Phone: [phone]   Fax:  [phone]  Occupational Therapy Treatment  Patient Details  Name: Terri Douglas MRN: [id number] Date of Birth: 10-18-1945 Referring Provider (OT): [name], [name] (Surgeon-[name])   Encounter Date: 05/16/2019  OT End of Session - 05/16/19 1033    Visit Number  2    Number of Visits  16    Date for OT Re-Evaluation  07/14/19   mini-reassessment 06/12/2019   Authorization Type  1) Medicare A & B; BCBS Supplement    Authorization Time Period  progress note by 10th visit    Authorization - Visit Number  2    Authorization - Number of Visits  10    OT Start Time  0902    OT Stop Time  0944    OT Time Calculation (min)  42 min    Activity Tolerance  Patient tolerated treatment well    Behavior During Therapy  [facility] for tasks assessed/performed       Past Medical History:  Diagnosis Date  . Arthritis   . Complication of anesthesia   . Gall stones   . GERD (gastroesophageal reflux disease)   . History of cardiac catheterization 2005   Minor elevation in cardiac enzymes however no significant obstructive CAD  . Hyperlipidemia   . Hypertension   . Insomnia   . PONV (postoperative nausea and vomiting)   . Prediabetes     Past Surgical History:  Procedure Laterality Date  . A-FLUTTER ABLATION N/A 08/01/2017   Procedure: A-FLUTTER ABLATION;  Surgeon: Thompson Grayer, MD;  Location: [place] [facility];  Service: Cardiovascular;  Laterality: N/A;  . ABDOMINAL HYSTERECTOMY    . BACK SURGERY  1992  . CARDIAC CATHETERIZATION     BACK IN 2004  SHE THINKS IT CAME BACK 'NORMAL'  . CHOLECYSTECTOMY N/A 06/24/2015   Procedure: LAPAROSCOPIC CHOLECYSTECTOMY;  Surgeon: Mickeal Skinner, MD;  Location: [place];  Service: General;  Laterality: N/A;  . COLONOSCOPY N/A 08/30/2012   Procedure: COLONOSCOPY;  Surgeon: Rogene Houston, MD;  [facility];  Service: Endoscopy;  Laterality: N/A;  830  . DILATION AND CURETTAGE OF UTERUS    . ESOPHAGOGASTRODUODENOSCOPY  04/15/2011   Procedure: ESOPHAGOGASTRODUODENOSCOPY (EGD);  Surgeon: Rogene Houston, MD;  [facility];  Service: Endoscopy;  Laterality: N/A;  11:30  . ESOPHAGOGASTRODUODENOSCOPY N/A 05/27/2016   Procedure: ESOPHAGOGASTRODUODENOSCOPY (EGD);  Surgeon: Rogene Houston, MD;  [facility];  Service: Endoscopy;  Laterality: N/A;  11:15  . EYE SURGERY     cataract removal  . NECK SURGERY  12/13/2018  . REVERSE SHOULDER ARTHROPLASTY Left 04/03/2019   Procedure: REVERSE SHOULDER ARTHROPLASTY;  Surgeon: Hiram Gash, MD;  [facility];  Service: Orthopedics;  Laterality: Left;  Marland Kitchen VAGINAL HYSTERECTOMY      There were no vitals filed for this visit.  Subjective Assessment - 05/16/19 0901    Subjective   S: I think my chair is too low.    Currently in Pain?  No/denies         [facility] OT Assessment - 05/16/19 0901      Assessment   Medical Diagnosis  s/p left reverse TSA      Precautions   Precautions  Shoulder    Type of Shoulder Precautions  See protocol  OT Treatments/Exercises (OP) - 05/16/19 0904      Exercises   Exercises  Shoulder      Shoulder Exercises: Supine   Protraction  PROM;5 reps    Horizontal ABduction  PROM;5 reps    External Rotation  PROM;5 reps    Internal Rotation  PROM;5 reps    Flexion  PROM;5 reps    ABduction  PROM;5 reps      Shoulder Exercises: Seated   Elevation  AROM;10 reps    Extension  AROM;10 reps    Row  AROM;10 reps      Shoulder Exercises: Therapy Ball   Flexion  10 reps    ABduction  10 reps      Shoulder Exercises: Isometric Strengthening   Flexion  Supine;3X5"    Extension  Supine;3X5"    External Rotation  Supine;3X5"    Internal Rotation  Supine;3X5"    ABduction  Supine;3X5"    ADduction  Supine;3X5"      Manual Therapy   Manual Therapy  Edema  management;Myofascial release    Manual therapy comments  completed separately from therapeutic exercises    Edema Management  retrograde massage to left hand, forearm, and upper arm to decrease edema and improve LUE mobility.     Myofascial Release  myofascial release to left upper arm, anterior shoulder, trapezius, and scapularis regions to decrease fascial restrictions and increase joint ROM.                OT Short Term Goals - 05/16/19 1037      OT SHORT TERM GOAL #1   Title  Pt will be provided with and educated on HEP to improve mobility in LUE required for ADL completion.    Time  4    Period  Weeks    Status  On-going    Target Date  06/14/19      OT SHORT TERM GOAL #2   Title  Pt will increase LUE P/ROM to [facility] to improve ability to use LUE as assist with dressing tasks.    Time  4    Period  Weeks    Status  On-going      OT SHORT TERM GOAL #3   Title  Pt will increase LUE strength to 4-/5 to improve ability to use LUE to reach items above shoulder height.    Time  4    Period  Weeks    Status  On-going        OT Long Term Goals - 05/16/19 1037      OT LONG TERM GOAL #1   Title  Pt will return to highest level of functioning using LUE as non-dominant during ADL completion.    Time  8    Period  Weeks    Status  On-going      OT LONG TERM GOAL #2   Title  Pt will decrease LUE pain to 3/10 or less to improve ability to sleep for 4 consecutive hours or more at night.    Time  8    Period  Weeks    Status  On-going      OT LONG TERM GOAL #3   Title  Pt will decrease LUE fascial restrictions to min amounts or less to improve mobility required for functional reaching tasks.    Time  8    Period  Weeks    Status  On-going      OT LONG TERM GOAL #4  Title  Pt will increase A/ROM in LUE to [facility] to improve ability to reach into overhead cabinets and behind back.    Time  2    Period  Weeks    Status  On-going      OT LONG TERM GOAL #5   Title  Pt will  increase LUE strength to 4+/5 or greater to improve ability to fish with LUE.    Time  8    Period  Weeks    Status  New            Plan - 05/16/19 1034    Clinical Impression Statement  A: Initiated myofascial release and edema management techniques this session. Also began isometrics, passive stretching, and scapular ROM exercises. Pt tolerating ROM to 50%, er to approximately 25%. Verbal cuing for form and technique.    Body Structure / Function / Physical Skills  ADL;Endurance;UE functional use;Fascial restriction;Pain;ROM;IADL;Strength    Plan  P: Continue with manual techniques, work on improving  ROM tolerance with passive stretching. Add low thumb tacks and prot/ret/elev/dep       Patient will benefit from skilled therapeutic intervention in order to improve the following deficits and impairments:   Body Structure / Function / Physical Skills: ADL, Endurance, UE functional use, Fascial restriction, Pain, ROM, IADL, Strength       Visit Diagnosis: Acute pain of left shoulder  Stiffness of left shoulder, not elsewhere classified  Other symptoms and signs involving the musculoskeletal system    Problem List Patient Active Problem List   Diagnosis Date Noted  . 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  . Insomnia 03/15/2019  . Abdominal pain, epigastric 05/09/2016  . Belching 05/09/2016  . Hyperlipidemia 06/18/2014  . Hyperglycemia 06/18/2014  . Gout 06/18/2014  . Osteopenia 10/28/2013  . Right foot pain 01/31/2013  . Hypertension 03/29/2011  . High cholesterol 03/29/2011   Guadelupe Sabin, OTR/L  [phone] 05/16/2019, 10:37 AM  [place] [address] [place], [place], [address] Phone: [phone]   Fax:  [phone]  Name: Terri Douglas MRN: [id number] Date of Birth: 20-Aug-1945

## 2019-05-22 ENCOUNTER — Other Ambulatory Visit: Payer: Self-pay

## 2019-05-22 ENCOUNTER — Encounter (HOSPITAL_COMMUNITY): Payer: Self-pay

## 2019-05-22 ENCOUNTER — Ambulatory Visit (HOSPITAL_COMMUNITY): Payer: Medicare Other

## 2019-05-22 DIAGNOSIS — M25512 Pain in left shoulder: Secondary | ICD-10-CM

## 2019-05-22 DIAGNOSIS — M25612 Stiffness of left shoulder, not elsewhere classified: Secondary | ICD-10-CM

## 2019-05-22 DIAGNOSIS — R29898 Other symptoms and signs involving the musculoskeletal system: Secondary | ICD-10-CM

## 2019-05-22 NOTE — Therapy (Signed)
[place], [place], [address] Phone: [phone]   Fax:  [phone]  Occupational Therapy Treatment  Patient Details  Name: Terri Douglas MRN: [id number] Date of Birth: September 13, 1945 Referring Provider (OT): [name], [name] (Surgeon-[name])   Encounter Date: 05/22/2019  OT End of Session - 05/22/19 0847    Visit Number  3    Number of Visits  16    Date for OT Re-Evaluation  07/14/19   mini-reassessment 06/12/2019   Authorization Type  1) Medicare A & B; BCBS Supplement    Authorization Time Period  progress note by 10th visit    Authorization - Visit Number  3    Authorization - Number of Visits  10    OT Start Time  0820    OT Stop Time  0900    OT Time Calculation (min)  40 min    Activity Tolerance  Patient tolerated treatment well    Behavior During Therapy  [facility] for tasks assessed/performed       Past Medical History:  Diagnosis Date  . Arthritis   . Complication of anesthesia   . Gall stones   . GERD (gastroesophageal reflux disease)   . History of cardiac catheterization 2005   Minor elevation in cardiac enzymes however no significant obstructive CAD  . Hyperlipidemia   . Hypertension   . Insomnia   . PONV (postoperative nausea and vomiting)   . Prediabetes     Past Surgical History:  Procedure Laterality Date  . A-FLUTTER ABLATION N/A 08/01/2017   Procedure: A-FLUTTER ABLATION;  Surgeon: Thompson Grayer, MD;  Location: [place] [facility];  Service: Cardiovascular;  Laterality: N/A;  . ABDOMINAL HYSTERECTOMY    . BACK SURGERY  1992  . CARDIAC CATHETERIZATION     BACK IN 2004  SHE THINKS IT CAME BACK 'NORMAL'  . CHOLECYSTECTOMY N/A 06/24/2015   Procedure: LAPAROSCOPIC CHOLECYSTECTOMY;  Surgeon: Mickeal Skinner, MD;  Location: [place];  Service: General;  Laterality: N/A;  . COLONOSCOPY N/A 08/30/2012   Procedure: COLONOSCOPY;  Surgeon: Rogene Houston, MD;  [facility];  Service: Endoscopy;  Laterality: N/A;  830  . DILATION AND CURETTAGE OF UTERUS    . ESOPHAGOGASTRODUODENOSCOPY  04/15/2011   Procedure: ESOPHAGOGASTRODUODENOSCOPY (EGD);  Surgeon: Rogene Houston, MD;  [facility];  Service: Endoscopy;  Laterality: N/A;  11:30  . ESOPHAGOGASTRODUODENOSCOPY N/A 05/27/2016   Procedure: ESOPHAGOGASTRODUODENOSCOPY (EGD);  Surgeon: Rogene Houston, MD;  [facility];  Service: Endoscopy;  Laterality: N/A;  11:15  . EYE SURGERY     cataract removal  . NECK SURGERY  12/13/2018  . REVERSE SHOULDER ARTHROPLASTY Left 04/03/2019   Procedure: REVERSE SHOULDER ARTHROPLASTY;  Surgeon: Hiram Gash, MD;  [facility];  Service: Orthopedics;  Laterality: Left;  Marland Kitchen VAGINAL HYSTERECTOMY      There were no vitals filed for this visit.  Subjective Assessment - 05/22/19 0844    Subjective   S: I'm able to put my earrings in myself now.    Currently in Pain?  No/denies         [facility] OT Assessment - 05/22/19 0845      Assessment   Medical Diagnosis  s/p left reverse TSA      Precautions   Precautions  Shoulder    Type of Shoulder Precautions  See protocol  OT Treatments/Exercises (OP) - 05/22/19 0845      Exercises   Exercises  Shoulder      Shoulder Exercises: Supine   Protraction  PROM;5 reps;AAROM;10 reps    Horizontal ABduction  PROM;5 reps    External Rotation  PROM;5 reps;AAROM;10 reps    Internal Rotation  PROM;5 reps;AAROM;10 reps    Flexion  PROM;5 reps;AAROM;10 reps    ABduction  PROM;5 reps      Shoulder Exercises: ROM/Strengthening   Thumb Tacks  1' mid level    Prot/Ret//Elev/Dep  1'      Manual Therapy   Manual Therapy  Edema management;Myofascial release    Manual therapy comments  completed separately from therapeutic exercises    Edema Management  retrograde massage to left hand, forearm, and upper arm to decrease edema and improve LUE mobility.     Myofascial Release  myofascial  release to left upper arm, anterior shoulder, trapezius, and scapularis regions to decrease fascial restrictions and increase joint ROM.              OT Education - 05/22/19 0913    Education Details  supine AA/ROM protraction, flexion, IR/er    Person(s) Educated  Patient    Methods  Explanation;Demonstration;Verbal cues;Handout    Comprehension  Returned demonstration;Verbalized understanding       OT Short Term Goals - 05/16/19 1037      OT SHORT TERM GOAL #1   Title  Pt will be provided with and educated on HEP to improve mobility in LUE required for ADL completion.    Time  4    Period  Weeks    Status  On-going    Target Date  06/14/19      OT SHORT TERM GOAL #2   Title  Pt will increase LUE P/ROM to [facility] to improve ability to use LUE as assist with dressing tasks.    Time  4    Period  Weeks    Status  On-going      OT SHORT TERM GOAL #3   Title  Pt will increase LUE strength to 4-/5 to improve ability to use LUE to reach items above shoulder height.    Time  4    Period  Weeks    Status  On-going        OT Long Term Goals - 05/22/19 0847      OT LONG TERM GOAL #1   Title  Pt will return to highest level of functioning using LUE as non-dominant during ADL completion.    Time  8    Period  Weeks    Status  On-going      OT LONG TERM GOAL #2   Title  Pt will decrease LUE pain to 3/10 or less to improve ability to sleep for 4 consecutive hours or more at night.    Time  8    Period  Weeks    Status  On-going      OT LONG TERM GOAL #3   Title  Pt will decrease LUE fascial restrictions to min amounts or less to improve mobility required for functional reaching tasks.    Time  8    Period  Weeks    Status  On-going      OT LONG TERM GOAL #4   Title  Pt will increase A/ROM in LUE to [facility] to improve ability to reach into overhead cabinets and behind back.    Time  2  Period  Weeks    Status  On-going      OT LONG TERM GOAL #5   Title  Pt will  increase LUE strength to 4+/5 or greater to improve ability to fish with LUE.    Time  8    Period  Weeks    Status  On-going            Plan - 05/22/19 0914    Clinical Impression Statement  A: Progressed to supine AA/ROM for flexion, protraction, and IR/er. Updated HEP. Patient with minimal fascial restrictions. Re-educated on positioning techniques to help decrease edema and use of cold therapy after session and periodically at home. VC for form and technique were provided.    Body Structure / Function / Physical Skills  ADL;Endurance;UE functional use;Fascial restriction;Pain;ROM;IADL;Strength    Plan  P: Continue with manual techniques and improving ROM tolerance with passive stretching. Continue with supine AA/ROM (flexion, protraction, IR/er). Add waist level wall wash. Add PVC pipe slide.    Consulted and Agree with Plan of Care  Patient       Patient will benefit from skilled therapeutic intervention in order to improve the following deficits and impairments:   Body Structure / Function / Physical Skills: ADL, Endurance, UE functional use, Fascial restriction, Pain, ROM, IADL, Strength       Visit Diagnosis: Other symptoms and signs involving the musculoskeletal system  Stiffness of left shoulder, not elsewhere classified  Acute pain of left shoulder    Problem List Patient Active Problem List   Diagnosis Date Noted  . 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  . Insomnia 03/15/2019  . Abdominal pain, epigastric 05/09/2016  . Belching 05/09/2016  . Hyperlipidemia 06/18/2014  . Hyperglycemia 06/18/2014  . Gout 06/18/2014  . Osteopenia 10/28/2013  . Right foot pain 01/31/2013  . Hypertension 03/29/2011  . High cholesterol 03/29/2011   Ailene Ravel, OTR/L,CBIS  [phone]  05/22/2019, 9:16 AM  [facility] [address] [place], [place], [address] Phone: [phone]   Fax:   [phone]  Name: Terri Douglas MRN: [id number] Date of Birth: 1945/09/07

## 2019-05-22 NOTE — Patient Instructions (Signed)
Perform each exercise ____10-15____ reps. 2-3x days. ** Complete laying down for now.**  Protraction - STANDING  Start by holding a wand or cane at chest height.  Next, slowly push the wand outwards in front of your body so that your elbows become fully straightened. Then, return to the original position.     Shoulder FLEXION - STANDING - PALMS DOWN  In the standing position, hold a wand/cane with both arms, palms down on both sides. Raise up the wand/cane allowing your unaffected arm to perform most of the effort. Your affected arm should be partially relaxed.      Internal/External ROTATION - STANDING  In the standing position, hold a wand/cane with both hands keeping your elbows bent. Move your arms and wand/cane to one side.  Your affected arm should be partially relaxed while your unaffected arm performs most of the effort.

## 2019-05-24 ENCOUNTER — Ambulatory Visit (HOSPITAL_COMMUNITY): Payer: Medicare Other | Admitting: Specialist

## 2019-05-24 ENCOUNTER — Other Ambulatory Visit: Payer: Self-pay

## 2019-05-24 ENCOUNTER — Encounter (HOSPITAL_COMMUNITY): Payer: Self-pay | Admitting: Specialist

## 2019-05-24 DIAGNOSIS — R29898 Other symptoms and signs involving the musculoskeletal system: Secondary | ICD-10-CM

## 2019-05-24 DIAGNOSIS — M25612 Stiffness of left shoulder, not elsewhere classified: Secondary | ICD-10-CM

## 2019-05-24 DIAGNOSIS — M25512 Pain in left shoulder: Secondary | ICD-10-CM | POA: Diagnosis not present

## 2019-05-24 NOTE — Therapy (Signed)
[place], [place], [address] Phone: [phone]   Fax:  [phone]  Occupational Therapy Treatment  Patient Details  Name: Terri Douglas MRN: [id number] Date of Birth: 05/21/1946 Referring Provider (OT): [name], [name] (Surgeon-[name])   Encounter Date: 05/24/2019  OT End of Session - 05/24/19 1452    Visit Number  4    Number of Visits  16    Date for OT Re-Evaluation  07/14/19   mini reassess on 1/6   Authorization Type  1) Medicare A & B; BCBS Supplement    Authorization Time Period  progress note by 10th visit    Authorization - Number of Visits  10    OT Start Time  0905    OT Stop Time  0950    OT Time Calculation (min)  45 min    Activity Tolerance  Patient tolerated treatment well    Behavior During Therapy  [facility] for tasks assessed/performed       Past Medical History:  Diagnosis Date  . Arthritis   . Complication of anesthesia   . Gall stones   . GERD (gastroesophageal reflux disease)   . History of cardiac catheterization 2005   Minor elevation in cardiac enzymes however no significant obstructive CAD  . Hyperlipidemia   . Hypertension   . Insomnia   . PONV (postoperative nausea and vomiting)   . Prediabetes     Past Surgical History:  Procedure Laterality Date  . A-FLUTTER ABLATION N/A 08/01/2017   Procedure: A-FLUTTER ABLATION;  Surgeon: Thompson Grayer, MD;  Location: [place] [facility];  Service: Cardiovascular;  Laterality: N/A;  . ABDOMINAL HYSTERECTOMY    . BACK SURGERY  1992  . CARDIAC CATHETERIZATION     BACK IN 2004  SHE THINKS IT CAME BACK 'NORMAL'  . CHOLECYSTECTOMY N/A 06/24/2015   Procedure: LAPAROSCOPIC CHOLECYSTECTOMY;  Surgeon: Mickeal Skinner, MD;  Location: [place];  Service: General;  Laterality: N/A;  . COLONOSCOPY N/A 08/30/2012   Procedure: COLONOSCOPY;  Surgeon: Rogene Houston, MD;  [facility];  Service: Endoscopy;  Laterality: N/A;   830  . DILATION AND CURETTAGE OF UTERUS    . ESOPHAGOGASTRODUODENOSCOPY  04/15/2011   Procedure: ESOPHAGOGASTRODUODENOSCOPY (EGD);  Surgeon: Rogene Houston, MD;  [facility];  Service: Endoscopy;  Laterality: N/A;  11:30  . ESOPHAGOGASTRODUODENOSCOPY N/A 05/27/2016   Procedure: ESOPHAGOGASTRODUODENOSCOPY (EGD);  Surgeon: Rogene Houston, MD;  [facility];  Service: Endoscopy;  Laterality: N/A;  11:15  . EYE SURGERY     cataract removal  . NECK SURGERY  12/13/2018  . REVERSE SHOULDER ARTHROPLASTY Left 04/03/2019   Procedure: REVERSE SHOULDER ARTHROPLASTY;  Surgeon: Hiram Gash, MD;  [facility];  Service: Orthopedics;  Laterality: Left;  Marland Kitchen VAGINAL HYSTERECTOMY      There were no vitals filed for this visit.  Subjective Assessment - 05/24/19 1451    Subjective   S:  its getting better.    Currently in Pain?  Yes    Pain Score  4     Pain Location  Shoulder    Pain Orientation  Left    Pain Descriptors / Indicators  Aching         [facility] OT Assessment - 05/24/19 0001      Assessment   Medical Diagnosis  s/p left reverse TSA      Precautions   Precautions  Shoulder  Type of Shoulder Precautions  See protocol                OT Treatments/Exercises (OP) - 05/24/19 0001      Transfers   Comments  +-      ADLs   Overall ADLs  -g      Exercises   Exercises  Shoulder      Shoulder Exercises: Supine   Protraction  PROM;5 reps;AAROM;10 reps    Horizontal ABduction  PROM;5 reps;AAROM;10 reps    External Rotation  PROM;5 reps;AAROM;10 reps    Internal Rotation  PROM;5 reps;AAROM;10 reps    Flexion  PROM;5 reps;AAROM;10 reps    ABduction  PROM;5 reps;AAROM;10 reps    ABduction Limitations  scaption for AA/ROM      Shoulder Exercises: Seated   Protraction  AAROM;10 reps    External Rotation  AAROM;10 reps    Internal Rotation  AAROM;10 reps    Flexion  AAROM;10 reps    Abduction  AAROM;10 reps    ABduction Limitations  scaption       Shoulder Exercises: Therapy Ball   Flexion  10 reps    ABduction  10 reps      Shoulder Exercises: ROM/Strengthening   Wall Wash  waist to chest height for 1 minute     Other ROM/Strengthening Exercises  pvc pipe slide in standing into flexion X 10 - with min cueing to depress shoulder blade       Manual Therapy   Manual Therapy  Edema management;Myofascial release    Manual therapy comments  completed separately from therapeutic exercises    Edema Management  retrograde massage to left hand, forearm, and upper arm to decrease edema and improve LUE mobility.     Myofascial Release  myofascial release to left upper arm, anterior shoulder, trapezius, and scapularis regions to decrease fascial restrictions and increase joint ROM.              OT Education - 05/24/19 1451    Education Details  patient requesting updated HEP as current does not show pictures of supine exercises, rather seated.  therapist issued updated handout with supine pictures.    Person(s) Educated  Patient    Methods  Explanation;Demonstration;Verbal cues;Handout    Comprehension  Returned demonstration;Verbalized understanding       OT Short Term Goals - 05/24/19 1457      OT SHORT TERM GOAL #1   Title  Pt will be provided with and educated on HEP to improve mobility in LUE required for ADL completion.    Time  4    Period  Weeks    Status  On-going    Target Date  06/14/19      OT SHORT TERM GOAL #2   Title  Pt will increase LUE P/ROM to [facility] to improve ability to use LUE as assist with dressing tasks.    Time  4    Period  Weeks    Status  On-going      OT SHORT TERM GOAL #3   Title  Pt will increase LUE strength to 4-/5 to improve ability to use LUE to reach items above shoulder height.    Time  4    Period  Weeks    Status  On-going        OT Long Term Goals - 05/24/19 1458      OT LONG TERM GOAL #1   Title  Pt will return to highest level of  functioning using LUE as non-dominant  during ADL completion.    Time  8    Period  Weeks    Status  On-going      OT LONG TERM GOAL #2   Title  Pt will decrease LUE pain to 3/10 or less to improve ability to sleep for 4 consecutive hours or more at night.    Time  8    Period  Weeks    Status  On-going      OT LONG TERM GOAL #3   Title  Pt will decrease LUE fascial restrictions to min amounts or less to improve mobility required for functional reaching tasks.    Time  8    Period  Weeks    Status  On-going      OT LONG TERM GOAL #4   Title  Pt will increase A/ROM in LUE to [facility] to improve ability to reach into overhead cabinets and behind back.    Time  2    Period  Weeks    Status  On-going      OT LONG TERM GOAL #5   Title  Pt will increase LUE strength to 4+/5 or greater to improve ability to fish with LUE.    Time  8    Period  Weeks    Status  On-going            Plan - 05/24/19 1452    Clinical Impression Statement  A:  Patient able to complete AA/ROM for all ranges in supine, adding scaption this date.  patient continues to present with increased edema in her entire LUE, continued with retrograde massage to address edema.    Body Structure / Function / Physical Skills  ADL;Endurance;UE functional use;Fascial restriction;Pain;ROM;IADL;Strength    Plan  P:  increase independence with depressing shoulder blade during aa/rom in supine and seated position with less cuing.  increase to 15 repetitions with aa/rom.       Patient will benefit from skilled therapeutic intervention in order to improve the following deficits and impairments:   Body Structure / Function / Physical Skills: ADL, Endurance, UE functional use, Fascial restriction, Pain, ROM, IADL, Strength       Visit Diagnosis: Stiffness of left shoulder, not elsewhere classified  Acute pain of left shoulder  Other symptoms and signs involving the musculoskeletal system    Problem List Patient Active Problem List   Diagnosis Date Noted   . 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  . Insomnia 03/15/2019  . Abdominal pain, epigastric 05/09/2016  . Belching 05/09/2016  . Hyperlipidemia 06/18/2014  . Hyperglycemia 06/18/2014  . Gout 06/18/2014  . Osteopenia 10/28/2013  . Right foot pain 01/31/2013  . Hypertension 03/29/2011  . High cholesterol 03/29/2011    Vangie Bicker, [place], OTR/L [phone]  05/24/2019, 3:29 PM  [place] [address] [place], [place], [address] Phone: [phone]   Fax:  [phone]  Name: Terri Douglas MRN: [id number] Date of Birth: June 29, 1945

## 2019-05-24 NOTE — Patient Instructions (Signed)
Cane Overhead - Supine  Hold cane at thighs with both hands, extend arms straight over head. Hold ___ seconds. Repeat __10_ times. Do _2__ times per day.  SELF ASSISTED WITH OBJECT: Shoulder External Rotation - Supine    Hold cane with both hands, keep elbow bent and next to body. Rotate arm away from body. _10__ reps per set, __2_ sets per day, __7_ days per week Add towel to keep elbow at side.  Copyright  VHI. All rights reserved.    BENCH PRESS (Push bar up to ceiling at shoulder height) 10 times each 2 times per day

## 2019-05-28 ENCOUNTER — Ambulatory Visit (HOSPITAL_COMMUNITY): Payer: Medicare Other | Admitting: Occupational Therapy

## 2019-05-28 ENCOUNTER — Encounter (HOSPITAL_COMMUNITY): Payer: Self-pay | Admitting: Occupational Therapy

## 2019-05-28 ENCOUNTER — Other Ambulatory Visit: Payer: Self-pay

## 2019-05-28 DIAGNOSIS — M25612 Stiffness of left shoulder, not elsewhere classified: Secondary | ICD-10-CM

## 2019-05-28 DIAGNOSIS — R29898 Other symptoms and signs involving the musculoskeletal system: Secondary | ICD-10-CM

## 2019-05-28 DIAGNOSIS — M25512 Pain in left shoulder: Secondary | ICD-10-CM | POA: Diagnosis not present

## 2019-05-28 NOTE — Therapy (Signed)
[place], [place], [address] Phone: [phone]   Fax:  [phone]  Occupational Therapy Treatment  Patient Details  Name: Terri Douglas MRN: [id number] Date of Birth: Dec 19, 1945 Referring Provider (OT): [name], [name] (Surgeon-[name])   Encounter Date: 05/28/2019  OT End of Session - 05/28/19 1735    Visit Number  5    Number of Visits  16    Date for OT Re-Evaluation  07/14/19   mini reassess on 1/6   Authorization Type  1) Medicare A & B; BCBS Supplement    Authorization Time Period  progress note by 10th visit    Authorization - Visit Number  5    Authorization - Number of Visits  10    OT Start Time  [id number]    OT Stop Time  1733    OT Time Calculation (min)  46 min    Activity Tolerance  Patient tolerated treatment well    Behavior During Therapy  [facility] for tasks assessed/performed       Past Medical History:  Diagnosis Date  . Arthritis   . Complication of anesthesia   . Gall stones   . GERD (gastroesophageal reflux disease)   . History of cardiac catheterization 2005   Minor elevation in cardiac enzymes however no significant obstructive CAD  . Hyperlipidemia   . Hypertension   . Insomnia   . PONV (postoperative nausea and vomiting)   . Prediabetes     Past Surgical History:  Procedure Laterality Date  . A-FLUTTER ABLATION N/A 08/01/2017   Procedure: A-FLUTTER ABLATION;  Surgeon: Thompson Grayer, MD;  Location: [place] [facility];  Service: Cardiovascular;  Laterality: N/A;  . ABDOMINAL HYSTERECTOMY    . BACK SURGERY  1992  . CARDIAC CATHETERIZATION     BACK IN 2004  SHE THINKS IT CAME BACK 'NORMAL'  . CHOLECYSTECTOMY N/A 06/24/2015   Procedure: LAPAROSCOPIC CHOLECYSTECTOMY;  Surgeon: Mickeal Skinner, MD;  Location: [place];  Service: General;  Laterality: N/A;  . COLONOSCOPY N/A 08/30/2012   Procedure: COLONOSCOPY;  Surgeon: Rogene Houston, MD;  [facility];   Service: Endoscopy;  Laterality: N/A;  830  . DILATION AND CURETTAGE OF UTERUS    . ESOPHAGOGASTRODUODENOSCOPY  04/15/2011   Procedure: ESOPHAGOGASTRODUODENOSCOPY (EGD);  Surgeon: Rogene Houston, MD;  [facility];  Service: Endoscopy;  Laterality: N/A;  11:30  . ESOPHAGOGASTRODUODENOSCOPY N/A 05/27/2016   Procedure: ESOPHAGOGASTRODUODENOSCOPY (EGD);  Surgeon: Rogene Houston, MD;  [facility];  Service: Endoscopy;  Laterality: N/A;  11:15  . EYE SURGERY     cataract removal  . NECK SURGERY  12/13/2018  . REVERSE SHOULDER ARTHROPLASTY Left 04/03/2019   Procedure: REVERSE SHOULDER ARTHROPLASTY;  Surgeon: Hiram Gash, MD;  [facility];  Service: Orthopedics;  Laterality: Left;  Marland Kitchen VAGINAL HYSTERECTOMY      There were no vitals filed for this visit.  Subjective Assessment - 05/28/19 1646    Subjective   S: I went and got a ball.    Currently in Pain?  No/denies         [facility] OT Assessment - 05/28/19 1646      Assessment   Medical Diagnosis  s/p left reverse TSA      Precautions   Precautions  Shoulder    Type of Shoulder Precautions  See protocol  OT Treatments/Exercises (OP) - 05/28/19 1649      Exercises   Exercises  Shoulder      Shoulder Exercises: Supine   Protraction  PROM;5 reps;AAROM;15 reps    Horizontal ABduction  PROM;5 reps;AAROM;15 reps    External Rotation  PROM;5 reps;AAROM;15 reps    Internal Rotation  PROM;5 reps;AAROM;15 reps    Flexion  PROM;5 reps;AAROM;15 reps    ABduction  PROM;5 reps;AAROM;15 reps      Shoulder Exercises: Standing   Protraction  AAROM;12 reps    Horizontal ABduction  AAROM;12 reps    External Rotation  AAROM;12 reps    Internal Rotation  AAROM;12 reps    Flexion  AAROM;12 reps    ABduction  AAROM;12 reps      Shoulder Exercises: Pulleys   Flexion  1 minute    ABduction  1 minute      Shoulder Exercises: ROM/Strengthening   Other ROM/Strengthening Exercises  pvc pipe  slide in standing into flexion X 10 - with min cueing to depress shoulder blade       Manual Therapy   Manual Therapy  Edema management;Myofascial release    Manual therapy comments  completed separately from therapeutic exercises    Edema Management  retrograde massage to left hand, forearm, and upper arm to decrease edema and improve LUE mobility.     Myofascial Release  myofascial release to left upper arm, anterior shoulder, trapezius, and scapularis regions to decrease fascial restrictions and increase joint ROM.                OT Short Term Goals - 05/24/19 1457      OT SHORT TERM GOAL #1   Title  Pt will be provided with and educated on HEP to improve mobility in LUE required for ADL completion.    Time  4    Period  Weeks    Status  On-going    Target Date  06/14/19      OT SHORT TERM GOAL #2   Title  Pt will increase LUE P/ROM to [facility] to improve ability to use LUE as assist with dressing tasks.    Time  4    Period  Weeks    Status  On-going      OT SHORT TERM GOAL #3   Title  Pt will increase LUE strength to 4-/5 to improve ability to use LUE to reach items above shoulder height.    Time  4    Period  Weeks    Status  On-going        OT Long Term Goals - 05/24/19 1458      OT LONG TERM GOAL #1   Title  Pt will return to highest level of functioning using LUE as non-dominant during ADL completion.    Time  8    Period  Weeks    Status  On-going      OT LONG TERM GOAL #2   Title  Pt will decrease LUE pain to 3/10 or less to improve ability to sleep for 4 consecutive hours or more at night.    Time  8    Period  Weeks    Status  On-going      OT LONG TERM GOAL #3   Title  Pt will decrease LUE fascial restrictions to min amounts or less to improve mobility required for functional reaching tasks.    Time  8    Period  Weeks  Status  On-going      OT LONG TERM GOAL #4   Title  Pt will increase A/ROM in LUE to [facility] to improve ability to reach into  overhead cabinets and behind back.    Time  2    Period  Weeks    Status  On-going      OT LONG TERM GOAL #5   Title  Pt will increase LUE strength to 4+/5 or greater to improve ability to fish with LUE.    Time  8    Period  Weeks    Status  On-going            Plan - 05/28/19 1711    Clinical Impression Statement  A: Continued with manual therapy to address edema and fascial restrictions in LUE. Continued with passive stretching and AA/ROM in supine, minimal trapezius activation during supine exercises today. Occasional cuing for depressing trapezius during standing exercises. Continued with PVC pipe slide, added pulleys. Verbal cuing for form and technique.    Body Structure / Function / Physical Skills  ADL;Endurance;UE functional use;Fascial restriction;Pain;ROM;IADL;Strength    Plan  P: continue with AA/ROM, add scapular theraband row and extension       Patient will benefit from skilled therapeutic intervention in order to improve the following deficits and impairments:   Body Structure / Function / Physical Skills: ADL, Endurance, UE functional use, Fascial restriction, Pain, ROM, IADL, Strength       Visit Diagnosis: Stiffness of left shoulder, not elsewhere classified  Acute pain of left shoulder  Other symptoms and signs involving the musculoskeletal system    Problem List Patient Active Problem List   Diagnosis Date Noted  . 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  . Insomnia 03/15/2019  . Abdominal pain, epigastric 05/09/2016  . Belching 05/09/2016  . Hyperlipidemia 06/18/2014  . Hyperglycemia 06/18/2014  . Gout 06/18/2014  . Osteopenia 10/28/2013  . Right foot pain 01/31/2013  . Hypertension 03/29/2011  . High cholesterol 03/29/2011   Guadelupe Sabin, OTR/L  [phone] 05/28/2019, 5:35 PM  [place] [address] [place], [place], [address] Phone: [phone]   Fax:   [phone]  Name: Terri Douglas MRN: [id number] Date of Birth: September 30, 1945

## 2019-06-04 ENCOUNTER — Ambulatory Visit (HOSPITAL_COMMUNITY): Payer: Medicare Other

## 2019-06-04 ENCOUNTER — Encounter (HOSPITAL_COMMUNITY): Payer: Self-pay

## 2019-06-04 ENCOUNTER — Other Ambulatory Visit: Payer: Self-pay

## 2019-06-04 DIAGNOSIS — M25512 Pain in left shoulder: Secondary | ICD-10-CM

## 2019-06-04 DIAGNOSIS — M25612 Stiffness of left shoulder, not elsewhere classified: Secondary | ICD-10-CM

## 2019-06-04 DIAGNOSIS — R29898 Other symptoms and signs involving the musculoskeletal system: Secondary | ICD-10-CM

## 2019-06-04 NOTE — Patient Instructions (Signed)
Repeat all exercises 10-15 times, 1-2 times per day.  1) Shoulder Protraction    Begin with elbows by your side, slowly "punch" straight out in front of you.      2) Shoulder Flexion  Standing:         Begin with arms at your side with thumbs pointed up, slowly raise both arms up and forward towards overhead.         3) Horizontal abduction/adduction   Standing:           Begin with arms straight out in front of you, bring out to the side in at "T" shape. Keep arms straight entire time.       5) Shoulder Abduction  Standing:       Begin with your arms  next to your side. Slowly move your arms out to the side so that they go overhead, in a jumping jack or snow angel movement.

## 2019-06-04 NOTE — Therapy (Signed)
[place], [place], [address] Phone: [phone]   Fax:  [phone]  Occupational Therapy Treatment  Patient Details  Name: Terri Douglas MRN: [id number] Date of Birth: [birth year] Referring Provider (OT): [name], [name] (Surgeon-[name])   Encounter Date: 06/04/2019  OT End of Session - 06/04/19 1413    Visit Number  6    Number of Visits  16    Date for OT Re-Evaluation  07/14/19   mini reassess on 1/6   Authorization Type  1) Medicare A & B; BCBS Supplement    Authorization Time Period  progress note by 10th visit    Authorization - Visit Number  6    Authorization - Number of Visits  10    OT Start Time  1300    OT Stop Time  1342    OT Time Calculation (min)  42 min    Activity Tolerance  Patient tolerated treatment well    Behavior During Therapy  [facility] for tasks assessed/performed       Past Medical History:  Diagnosis Date  . Arthritis   . Complication of anesthesia   . Gall stones   . GERD (gastroesophageal reflux disease)   . History of cardiac catheterization 2005   Minor elevation in cardiac enzymes however no significant obstructive CAD  . Hyperlipidemia   . Hypertension   . Insomnia   . PONV (postoperative nausea and vomiting)   . Prediabetes     Past Surgical History:  Procedure Laterality Date  . A-FLUTTER ABLATION N/A 08/01/2017   Procedure: A-FLUTTER ABLATION;  Surgeon: Thompson Grayer, MD;  Location: [facility];  Service: Cardiovascular;  Laterality: N/A;  . ABDOMINAL HYSTERECTOMY    . BACK SURGERY  1992  . CARDIAC CATHETERIZATION     BACK IN 2004  SHE THINKS IT CAME BACK 'NORMAL'  . CHOLECYSTECTOMY N/A 06/24/2015   Procedure: LAPAROSCOPIC CHOLECYSTECTOMY;  Surgeon: Mickeal Skinner, MD;  Location: [place];  Service: General;  Laterality: N/A;  . COLONOSCOPY N/A 08/30/2012   Procedure: COLONOSCOPY;  Surgeon: Rogene Houston, MD;  [facility];   Service: Endoscopy;  Laterality: N/A;  830  . DILATION AND CURETTAGE OF UTERUS    . ESOPHAGOGASTRODUODENOSCOPY  04/15/2011   Procedure: ESOPHAGOGASTRODUODENOSCOPY (EGD);  Surgeon: Rogene Houston, MD;  [facility];  Service: Endoscopy;  Laterality: N/A;  11:30  . ESOPHAGOGASTRODUODENOSCOPY N/A 05/27/2016   Procedure: ESOPHAGOGASTRODUODENOSCOPY (EGD);  Surgeon: Rogene Houston, MD;  [facility];  Service: Endoscopy;  Laterality: N/A;  11:15  . EYE SURGERY     cataract removal  . NECK SURGERY  12/13/2018  . REVERSE SHOULDER ARTHROPLASTY Left 04/03/2019   Procedure: REVERSE SHOULDER ARTHROPLASTY;  Surgeon: Hiram Gash, MD;  [facility];  Service: Orthopedics;  Laterality: Left;  Marland Kitchen VAGINAL HYSTERECTOMY      There were no vitals filed for this visit.  Subjective Assessment - 06/04/19 1320    Subjective   S: I really like the rope I did last time.    Currently in Pain?  Yes    Pain Score  5     Pain Location  Shoulder    Pain Orientation  Left    Pain Descriptors / Indicators  Aching;Sore    Pain Type  Acute pain    Pain Radiating Towards  N/A    Pain Onset  In the past 7 days  Pain Frequency  Occasional    Aggravating Factors   Exercises during therapy session    Pain Relieving Factors  when session is finished    Effect of Pain on Daily Activities  Moderate effect    Multiple Pain Sites  No         [facility] OT Assessment - 06/04/19 1323      Assessment   Medical Diagnosis  s/p left reverse TSA      Precautions   Precautions  Shoulder    Type of Shoulder Precautions  See protocol                OT Treatments/Exercises (OP) - 06/04/19 1323      Exercises   Exercises  Shoulder      Shoulder Exercises: Supine   Protraction  PROM;5 reps;AROM;10 reps    Horizontal ABduction  PROM;5 reps;AROM;10 reps    External Rotation  PROM;5 reps;AAROM;15 reps    Internal Rotation  PROM;5 reps;AAROM;15 reps    Flexion  PROM;5 reps;AROM;10 reps       Shoulder Exercises: Standing   Protraction  AROM;10 reps    Horizontal ABduction  AROM;10 reps    External Rotation  AAROM;15 reps    Internal Rotation  AAROM;15 reps    Flexion  AROM;10 reps    ABduction  AROM;10 reps;Limitations    ABduction Limitations  50% range      Shoulder Exercises: Pulleys   Flexion  1 minute   standing   ABduction  1 minute   standing     Manual Therapy   Manual Therapy  Edema management;Myofascial release    Manual therapy comments  completed separately from therapeutic exercises    Edema Management  retrograde massage to left hand, forearm, and upper arm to decrease edema and improve LUE mobility.     Myofascial Release  myofascial release to left upper arm, anterior shoulder, trapezius, and scapularis regions to decrease fascial restrictions and increase joint ROM.              OT Education - 06/04/19 1340    Education Details  progress to standing A/ROM exerciess. Continue completing IR/er with dowel rod to increase ROM.    Person(s) Educated  Patient    Methods  Explanation;Demonstration;Verbal cues;Handout    Comprehension  Verbalized understanding;Returned demonstration       OT Short Term Goals - 05/24/19 1457      OT SHORT TERM GOAL #1   Title  Pt will be provided with and educated on HEP to improve mobility in LUE required for ADL completion.    Time  4    Period  Weeks    Status  On-going    Target Date  06/14/19      OT SHORT TERM GOAL #2   Title  Pt will increase LUE P/ROM to [facility] to improve ability to use LUE as assist with dressing tasks.    Time  4    Period  Weeks    Status  On-going      OT SHORT TERM GOAL #3   Title  Pt will increase LUE strength to 4-/5 to improve ability to use LUE to reach items above shoulder height.    Time  4    Period  Weeks    Status  On-going        OT Long Term Goals - 05/24/19 1458      OT LONG TERM GOAL #1   Title  Pt  will return to highest level of functioning using LUE as  non-dominant during ADL completion.    Time  8    Period  Weeks    Status  On-going      OT LONG TERM GOAL #2   Title  Pt will decrease LUE pain to 3/10 or less to improve ability to sleep for 4 consecutive hours or more at night.    Time  8    Period  Weeks    Status  On-going      OT LONG TERM GOAL #3   Title  Pt will decrease LUE fascial restrictions to min amounts or less to improve mobility required for functional reaching tasks.    Time  8    Period  Weeks    Status  On-going      OT LONG TERM GOAL #4   Title  Pt will increase A/ROM in LUE to [facility] to improve ability to reach into overhead cabinets and behind back.    Time  2    Period  Weeks    Status  On-going      OT LONG TERM GOAL #5   Title  Pt will increase LUE strength to 4+/5 or greater to improve ability to fish with LUE.    Time  8    Period  Weeks    Status  On-going            Plan - 06/04/19 1414    Clinical Impression Statement  A: Progressed to A/ROM shoulder exercises per protocol. Continued to use PVC pipe for IR/er to focus on increasing ROM. HEP was updated. VC for form and technique. Manual techniques were completed to address fascial restrictions.    Body Structure / Function / Physical Skills  ADL;Endurance;UE functional use;Fascial restriction;Pain;ROM;IADL;Strength    Plan  P: Continue with A/ROM. Add scapular theraband row and extension.    Consulted and Agree with Plan of Care  Patient       Patient will benefit from skilled therapeutic intervention in order to improve the following deficits and impairments:   Body Structure / Function / Physical Skills: ADL, Endurance, UE functional use, Fascial restriction, Pain, ROM, IADL, Strength       Visit Diagnosis: Stiffness of left shoulder, not elsewhere classified  Acute pain of left shoulder  Other symptoms and signs involving the musculoskeletal system    Problem List Patient Active Problem List   Diagnosis Date Noted  .  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  . Insomnia 03/15/2019  . Abdominal pain, epigastric 05/09/2016  . Belching 05/09/2016  . Hyperlipidemia 06/18/2014  . Hyperglycemia 06/18/2014  . Gout 06/18/2014  . Osteopenia 10/28/2013  . Right foot pain 01/31/2013  . Hypertension 03/29/2011  . High cholesterol 03/29/2011   Ailene Ravel, OTR/L,CBIS  [phone]  06/04/2019, 2:24 PM  [facility] [address] [place], [place], [address] Phone: [phone]   Fax:  [phone]  Name: Terri Douglas MRN: [id number] Date of Birth: Feb 26, 1946

## 2019-06-06 ENCOUNTER — Encounter (HOSPITAL_COMMUNITY): Payer: Self-pay | Admitting: Occupational Therapy

## 2019-06-06 ENCOUNTER — Other Ambulatory Visit: Payer: Self-pay

## 2019-06-06 ENCOUNTER — Ambulatory Visit (HOSPITAL_COMMUNITY): Payer: Medicare Other | Admitting: Occupational Therapy

## 2019-06-06 DIAGNOSIS — R29898 Other symptoms and signs involving the musculoskeletal system: Secondary | ICD-10-CM

## 2019-06-06 DIAGNOSIS — M25512 Pain in left shoulder: Secondary | ICD-10-CM | POA: Diagnosis not present

## 2019-06-06 DIAGNOSIS — M25612 Stiffness of left shoulder, not elsewhere classified: Secondary | ICD-10-CM

## 2019-06-06 NOTE — Therapy (Signed)
[place], [place], [address] Phone: [phone]   Fax:  [phone]  Occupational Therapy Treatment  Patient Details  Name: Terri Douglas MRN: [id number] Date of Birth: 05-Nov-1945 Referring Provider (OT): [name], [name] (Surgeon-[name])   Encounter Date: 06/06/2019  OT End of Session - 06/06/19 0946    Visit Number  7    Number of Visits  16    Date for OT Re-Evaluation  07/14/19   mini reassess on 1/6   Authorization Type  1) Medicare A & B; BCBS Supplement    Authorization Time Period  progress note by 10th visit    Authorization - Visit Number  7    Authorization - Number of Visits  10    OT Start Time  0900    OT Stop Time  0945    OT Time Calculation (min)  45 min    Activity Tolerance  Patient tolerated treatment well    Behavior During Therapy  [facility] for tasks assessed/performed       Past Medical History:  Diagnosis Date  . Arthritis   . Complication of anesthesia   . Gall stones   . GERD (gastroesophageal reflux disease)   . History of cardiac catheterization 2005   Minor elevation in cardiac enzymes however no significant obstructive CAD  . Hyperlipidemia   . Hypertension   . Insomnia   . PONV (postoperative nausea and vomiting)   . Prediabetes     Past Surgical History:  Procedure Laterality Date  . A-FLUTTER ABLATION N/A 08/01/2017   Procedure: A-FLUTTER ABLATION;  Surgeon: Thompson Grayer, MD;  Location: [place] [facility];  Service: Cardiovascular;  Laterality: N/A;  . ABDOMINAL HYSTERECTOMY    . BACK SURGERY  1992  . CARDIAC CATHETERIZATION     BACK IN 2004  SHE THINKS IT CAME BACK 'NORMAL'  . CHOLECYSTECTOMY N/A 06/24/2015   Procedure: LAPAROSCOPIC CHOLECYSTECTOMY;  Surgeon: Mickeal Skinner, MD;  Location: [place];  Service: General;  Laterality: N/A;  . COLONOSCOPY N/A 08/30/2012   Procedure: COLONOSCOPY;  Surgeon: Rogene Houston, MD;  [facility];   Service: Endoscopy;  Laterality: N/A;  830  . DILATION AND CURETTAGE OF UTERUS    . ESOPHAGOGASTRODUODENOSCOPY  04/15/2011   Procedure: ESOPHAGOGASTRODUODENOSCOPY (EGD);  Surgeon: Rogene Houston, MD;  [facility];  Service: Endoscopy;  Laterality: N/A;  11:30  . ESOPHAGOGASTRODUODENOSCOPY N/A 05/27/2016   Procedure: ESOPHAGOGASTRODUODENOSCOPY (EGD);  Surgeon: Rogene Houston, MD;  [facility];  Service: Endoscopy;  Laterality: N/A;  11:15  . EYE SURGERY     cataract removal  . NECK SURGERY  12/13/2018  . REVERSE SHOULDER ARTHROPLASTY Left 04/03/2019   Procedure: REVERSE SHOULDER ARTHROPLASTY;  Surgeon: Hiram Gash, MD;  [facility];  Service: Orthopedics;  Laterality: Left;  Marland Kitchen VAGINAL HYSTERECTOMY      There were no vitals filed for this visit.  Subjective Assessment - 06/06/19 0902    Subjective   S: That jumping jack motion is hard.    Currently in Pain?  Yes    Pain Score  3     Pain Location  Shoulder    Pain Orientation  Left    Pain Descriptors / Indicators  Aching;Sore    Pain Type  Acute pain    Pain Radiating Towards  n/a    Pain Onset  In the past 7 days    Pain Frequency  Occasional    Aggravating Factors   exercises during therapy    Pain Relieving Factors  rest    Effect of Pain on Daily Activities  mod effect on ADLs    Multiple Pain Sites  No         [facility] OT Assessment - 06/06/19 0901      Assessment   Medical Diagnosis  s/p left reverse TSA      Precautions   Precautions  Shoulder    Type of Shoulder Precautions  See protocol                OT Treatments/Exercises (OP) - 06/06/19 0902      Exercises   Exercises  Shoulder      Shoulder Exercises: Supine   Protraction  PROM;5 reps;AROM;10 reps    Horizontal ABduction  PROM;5 reps;AROM;10 reps    External Rotation  PROM;5 reps;AAROM;15 reps    Internal Rotation  PROM;5 reps;AAROM;15 reps    Flexion  PROM;5 reps;AROM;10 reps    ABduction  PROM;5 reps;AROM;10  reps      Shoulder Exercises: Standing   Protraction  AROM;10 reps    Horizontal ABduction  AROM;10 reps    External Rotation  AAROM;15 reps    Internal Rotation  AAROM;15 reps    Flexion  AROM;10 reps    ABduction  AROM;10 reps;Limitations    ABduction Limitations  50% range    Extension  Theraband;10 reps    Theraband Level (Shoulder Extension)  Level 2 (Red)    Row  Theraband;10 reps    Theraband Level (Shoulder Row)  Level 2 (Red)      Shoulder Exercises: Pulleys   Flexion  1 minute    ABduction  1 minute      Shoulder Exercises: ROM/Strengthening   Proximal Shoulder Strengthening, Supine  10X each, no rest breaks      Manual Therapy   Manual Therapy  Edema management;Myofascial release    Manual therapy comments  completed separately from therapeutic exercises    Edema Management  retrograde massage to left hand, forearm, and upper arm to decrease edema and improve LUE mobility.     Myofascial Release  myofascial release to left upper arm, anterior shoulder, trapezius, and scapularis regions to decrease fascial restrictions and increase joint ROM.              OT Education - 06/06/19 [phone]    Education Details  educated on use of tennis ball for self-massage at home    Person(s) Educated  Patient    Methods  Explanation;Demonstration;Verbal cues;Handout    Comprehension  Verbalized understanding;Returned demonstration       OT Short Term Goals - 05/24/19 1457      OT SHORT TERM GOAL #1   Title  Pt will be provided with and educated on HEP to improve mobility in LUE required for ADL completion.    Time  4    Period  Weeks    Status  On-going    Target Date  06/14/19      OT SHORT TERM GOAL #2   Title  Pt will increase LUE P/ROM to [facility] to improve ability to use LUE as assist with dressing tasks.    Time  4    Period  Weeks    Status  On-going      OT SHORT TERM GOAL #3   Title  Pt will increase LUE strength to 4-/5 to improve ability to use LUE to reach  items above shoulder height.    Time  4    Period  Weeks    Status  On-going        OT Long Term Goals - 05/24/19 1458      OT LONG TERM GOAL #1   Title  Pt will return to highest level of functioning using LUE as non-dominant during ADL completion.    Time  8    Period  Weeks    Status  On-going      OT LONG TERM GOAL #2   Title  Pt will decrease LUE pain to 3/10 or less to improve ability to sleep for 4 consecutive hours or more at night.    Time  8    Period  Weeks    Status  On-going      OT LONG TERM GOAL #3   Title  Pt will decrease LUE fascial restrictions to min amounts or less to improve mobility required for functional reaching tasks.    Time  8    Period  Weeks    Status  On-going      OT LONG TERM GOAL #4   Title  Pt will increase A/ROM in LUE to [facility] to improve ability to reach into overhead cabinets and behind back.    Time  2    Period  Weeks    Status  On-going      OT LONG TERM GOAL #5   Title  Pt will increase LUE strength to 4+/5 or greater to improve ability to fish with LUE.    Time  8    Period  Weeks    Status  On-going            Plan - 06/06/19 0946    Clinical Impression Statement  A: Continued with manual therapy to address edema and fascial restrictions today. Continued with A/ROM with exception of AA/ROM for er/IR. Added proximal shoulder strengthening in supine, scapular theraband row and extension. Verbal cuing for form and technique during exercises.    Body Structure / Function / Physical Skills  ADL;Endurance;UE functional use;Fascial restriction;Pain;ROM;IADL;Strength    Plan  P: Continue with A/ROM, add wall wash and prot/ret/elev/dep    Consulted and Agree with Plan of Care  Patient       Patient will benefit from skilled therapeutic intervention in order to improve the following deficits and impairments:   Body Structure / Function / Physical Skills: ADL, Endurance, UE functional use, Fascial restriction, Pain, ROM, IADL,  Strength       Visit Diagnosis: Stiffness of left shoulder, not elsewhere classified  Acute pain of left shoulder  Other symptoms and signs involving the musculoskeletal system    Problem List Patient Active Problem List   Diagnosis Date Noted  . 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  . Insomnia 03/15/2019  . Abdominal pain, epigastric 05/09/2016  . Belching 05/09/2016  . Hyperlipidemia 06/18/2014  . Hyperglycemia 06/18/2014  . Gout 06/18/2014  . Osteopenia 10/28/2013  . Right foot pain 01/31/2013  . Hypertension 03/29/2011  . High cholesterol 03/29/2011   Guadelupe Sabin, OTR/L  [phone] 06/06/2019, 9:48 AM  [facility] [address] [place], [place], [address] Phone: [phone]   Fax:  [phone]  Name: Terri Douglas MRN: [id number] Date of Birth: 1945-07-23

## 2019-06-11 ENCOUNTER — Other Ambulatory Visit: Payer: Self-pay

## 2019-06-11 ENCOUNTER — Ambulatory Visit (HOSPITAL_COMMUNITY): Payer: Medicare Other | Attending: Physician Assistant | Admitting: Occupational Therapy

## 2019-06-11 ENCOUNTER — Ambulatory Visit (INDEPENDENT_AMBULATORY_CARE_PROVIDER_SITE_OTHER): Payer: Medicare Other | Admitting: Internal Medicine

## 2019-06-11 ENCOUNTER — Encounter (HOSPITAL_COMMUNITY): Payer: Self-pay | Admitting: Occupational Therapy

## 2019-06-11 DIAGNOSIS — M25612 Stiffness of left shoulder, not elsewhere classified: Secondary | ICD-10-CM | POA: Diagnosis not present

## 2019-06-11 DIAGNOSIS — M25512 Pain in left shoulder: Secondary | ICD-10-CM | POA: Insufficient documentation

## 2019-06-11 DIAGNOSIS — R29898 Other symptoms and signs involving the musculoskeletal system: Secondary | ICD-10-CM | POA: Insufficient documentation

## 2019-06-11 NOTE — Therapy (Signed)
[place], [place], [address] Phone: [phone]   Fax:  [phone]  Occupational Therapy Treatment  Patient Details  Name: Terri Douglas MRN: [id number] Date of Birth: [birth year] Referring Provider (OT): [name], [name] (Surgeon-[name])   Progress Note Reporting Period 05/15/2019 to 06/11/2019  See note below for Objective Data and Assessment of Progress/Goals.       Encounter Date: 06/11/2019  OT End of Session - 06/11/19 1118    Visit Number  8    Number of Visits  16    Date for OT Re-Evaluation  07/14/19    Authorization Type  1) Medicare A & B; BCBS Supplement    Authorization Time Period  progress note by 18th visit    Authorization - Visit Number  8    Authorization - Number of Visits  18    OT Start Time  1032    OT Stop Time  1116    OT Time Calculation (min)  44 min    Activity Tolerance  Patient tolerated treatment well    Behavior During Therapy  WFL for tasks assessed/performed       Past Medical History:  Diagnosis Date  . Arthritis   . Complication of anesthesia   . Gall stones   . GERD (gastroesophageal reflux disease)   . History of cardiac catheterization 2005   Minor elevation in cardiac enzymes however no significant obstructive CAD  . Hyperlipidemia   . Hypertension   . Insomnia   . PONV (postoperative nausea and vomiting)   . Prediabetes     Past Surgical History:  Procedure Laterality Date  . A-FLUTTER ABLATION N/A 08/01/2017   Procedure: A-FLUTTER ABLATION;  Surgeon: Thompson Grayer, MD;  Location: [facility];  Service: Cardiovascular;  Laterality: N/A;  . ABDOMINAL HYSTERECTOMY    . BACK SURGERY  1992  . CARDIAC CATHETERIZATION     BACK IN 2004  SHE THINKS IT CAME BACK 'NORMAL'  . CHOLECYSTECTOMY N/A 06/24/2015   Procedure: LAPAROSCOPIC CHOLECYSTECTOMY;  Surgeon: Mickeal Skinner, MD;  Location: [facility];  Service: General;  Laterality: N/A;  .  COLONOSCOPY N/A 08/30/2012   Procedure: COLONOSCOPY;  Surgeon: Rogene Houston, MD;  [facility];  Service: Endoscopy;  Laterality: N/A;  830  . DILATION AND CURETTAGE OF UTERUS    . ESOPHAGOGASTRODUODENOSCOPY  04/15/2011   Procedure: ESOPHAGOGASTRODUODENOSCOPY (EGD);  Surgeon: Rogene Houston, MD;  [facility];  Service: Endoscopy;  Laterality: N/A;  11:30  . ESOPHAGOGASTRODUODENOSCOPY N/A 05/27/2016   Procedure: ESOPHAGOGASTRODUODENOSCOPY (EGD);  Surgeon: Rogene Houston, MD;  [facility];  Service: Endoscopy;  Laterality: N/A;  11:15  . EYE SURGERY     cataract removal  . NECK SURGERY  12/13/2018  . REVERSE SHOULDER ARTHROPLASTY Left 04/03/2019   Procedure: REVERSE SHOULDER ARTHROPLASTY;  Surgeon: Hiram Gash, MD;  [facility];  Service: Orthopedics;  Laterality: Left;  Marland Kitchen VAGINAL HYSTERECTOMY      There were no vitals filed for this visit.  Subjective Assessment - 06/11/19 1032    Subjective   S: I was afraid I had done something wrong, it was burning last night after I did my exercises.    Currently in Pain?  No/denies         [facility] OT Assessment - 06/11/19 1031      Assessment   Medical Diagnosis  s/p left reverse TSA  Precautions   Precautions  Shoulder    Type of Shoulder Precautions  See protocol       Observation/Other Assessments   Focus on Therapeutic Outcomes (FOTO)   54/100   42/100 previous     Palpation   Palpation comment  mod fascial restrictions along left upper arm, trapezius, and scapular regions      AROM   Overall AROM Comments  Assessed seated, er/IR adducted    AROM Assessment Site  Shoulder    Right/Left Shoulder  Left    Left Shoulder Flexion  122 Degrees   91 previous   Left Shoulder ABduction  107 Degrees   62 previous   Left Shoulder Internal Rotation  90 Degrees   same as previous   Left Shoulder External Rotation  40 Degrees   20 previous     PROM   Overall PROM Comments  Assessed supine,  er/IR adducted    PROM Assessment Site  Shoulder    Right/Left Shoulder  Left    Left Shoulder Flexion  158 Degrees   96 previous   Left Shoulder ABduction  160 Degrees   72 previous   Left Shoulder Internal Rotation  90 Degrees   previous   Left Shoulder External Rotation  55 Degrees   20 previous     Strength   Overall Strength Comments  Assessed seated, er/IR adducted    Strength Assessment Site  Shoulder    Right/Left Shoulder  Left    Left Shoulder Flexion  3/5   3-/5 previous   Left Shoulder ABduction  3/5   3-/5 previous   Left Shoulder Internal Rotation  4-/5   3/5 previous   Left Shoulder External Rotation  3/5   3-/5 previous              OT Treatments/Exercises (OP) - 06/11/19 1033      Exercises   Exercises  Shoulder      Shoulder Exercises: Supine   Protraction  PROM;5 reps    Horizontal ABduction  PROM;5 reps    External Rotation  PROM;5 reps    Internal Rotation  PROM;5 reps    Flexion  PROM;5 reps    ABduction  PROM;5 reps      Shoulder Exercises: Standing   Protraction  AROM;12 reps    Horizontal ABduction  AROM;12 reps    External Rotation  AROM;12 reps    Internal Rotation  AROM;12 reps    Flexion  AROM;12 reps    ABduction  AROM;12 reps    Extension  Theraband;10 reps    Theraband Level (Shoulder Extension)  Level 2 (Red)    Row  Theraband;10 reps    Theraband Level (Shoulder Row)  Level 2 (Red)      Shoulder Exercises: ROM/Strengthening   Wall Wash  1'    Proximal Shoulder Strengthening, Seated  10X each, no rest breaks    Prot/Ret//Elev/Dep  1' mod tactile cuing      Manual Therapy   Manual Therapy  Edema management;Myofascial release    Manual therapy comments  completed separately from therapeutic exercises    Edema Management  retrograde massage to left hand, forearm, and upper arm to decrease edema and improve LUE mobility.     Myofascial Release  myofascial release to left upper arm, anterior shoulder, trapezius, and  scapularis regions to decrease fascial restrictions and increase joint ROM.                OT Short  Term Goals - 06/11/19 1118      OT SHORT TERM GOAL #1   Title  Pt will be provided with and educated on HEP to improve mobility in LUE required for ADL completion.    Time  4    Period  Weeks    Status  On-going    Target Date  06/14/19      OT SHORT TERM GOAL #2   Title  Pt will increase LUE P/ROM to [facility] to improve ability to use LUE as assist with dressing tasks.    Time  4    Period  Weeks    Status  Achieved      OT SHORT TERM GOAL #3   Title  Pt will increase LUE strength to 4-/5 to improve ability to use LUE to reach items above shoulder height.    Time  4    Period  Weeks    Status  On-going        OT Long Term Goals - 05/24/19 1458      OT LONG TERM GOAL #1   Title  Pt will return to highest level of functioning using LUE as non-dominant during ADL completion.    Time  8    Period  Weeks    Status  On-going      OT LONG TERM GOAL #2   Title  Pt will decrease LUE pain to 3/10 or less to improve ability to sleep for 4 consecutive hours or more at night.    Time  8    Period  Weeks    Status  On-going      OT LONG TERM GOAL #3   Title  Pt will decrease LUE fascial restrictions to min amounts or less to improve mobility required for functional reaching tasks.    Time  8    Period  Weeks    Status  On-going      OT LONG TERM GOAL #4   Title  Pt will increase A/ROM in LUE to [facility] to improve ability to reach into overhead cabinets and behind back.    Time  2    Period  Weeks    Status  On-going      OT LONG TERM GOAL #5   Title  Pt will increase LUE strength to 4+/5 or greater to improve ability to fish with LUE.    Time  8    Period  Weeks    Status  On-going            Plan - 06/11/19 1119    Clinical Impression Statement  A: Mini-reassessment completed this session, pt has made good progress with improving ROM and strength of LUE and has  met 1 STG. Pt reports she can put her coat on now and has reached behind her for an item. Continued with A/ROM today, working on controlling movement to reach towards end range of motion. Added wall wash and prot/ret/elev/dep today. Pt requiring verbal cuing for form and technique    Body Structure / Function / Physical Skills  ADL;Endurance;UE functional use;Fascial restriction;Pain;ROM;IADL;Strength    Plan  P: Continue with prot/ret/elev/dep working on improved scapular retraction, add retraction with theraband       Patient will benefit from skilled therapeutic intervention in order to improve the following deficits and impairments:   Body Structure / Function / Physical Skills: ADL, Endurance, UE functional use, Fascial restriction, Pain, ROM, IADL, Strength  Visit Diagnosis: Stiffness of left shoulder, not elsewhere classified  Acute pain of left shoulder  Other symptoms and signs involving the musculoskeletal system    Problem List Patient Active Problem List   Diagnosis Date Noted  . 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  . Insomnia 03/15/2019  . Abdominal pain, epigastric 05/09/2016  . Belching 05/09/2016  . Hyperlipidemia 06/18/2014  . Hyperglycemia 06/18/2014  . Gout 06/18/2014  . Osteopenia 10/28/2013  . Right foot pain 01/31/2013  . Hypertension 03/29/2011  . High cholesterol 03/29/2011   Guadelupe Sabin, OTR/L  [phone] 06/11/2019, 11:36 AM  [facility] [address] [place], [place], [address] Phone: [phone]   Fax:  [phone]  Name: Terri Douglas MRN: [id number] Date of Birth: [birth year]

## 2019-06-13 ENCOUNTER — Ambulatory Visit (HOSPITAL_COMMUNITY): Payer: Medicare Other

## 2019-06-13 ENCOUNTER — Other Ambulatory Visit: Payer: Self-pay

## 2019-06-13 ENCOUNTER — Encounter (HOSPITAL_COMMUNITY): Payer: Self-pay

## 2019-06-13 DIAGNOSIS — M25512 Pain in left shoulder: Secondary | ICD-10-CM | POA: Diagnosis not present

## 2019-06-13 DIAGNOSIS — R29898 Other symptoms and signs involving the musculoskeletal system: Secondary | ICD-10-CM

## 2019-06-13 DIAGNOSIS — M25612 Stiffness of left shoulder, not elsewhere classified: Secondary | ICD-10-CM

## 2019-06-13 NOTE — Therapy (Signed)
[place], [place], [address] Phone: [phone]   Fax:  [phone]  Occupational Therapy Treatment  Patient Details  Name: Terri Douglas MRN: [id number] Date of Birth: Jan 02, 1946 Referring Provider (OT): [name], [name] (Surgeon-[name])   Encounter Date: 06/13/2019  OT End of Session - 06/13/19 1153    Visit Number  9    Number of Visits  16    Date for OT Re-Evaluation  07/14/19    Authorization Type  1) Medicare A & B; BCBS Supplement    Authorization Time Period  progress note by 18th visit    Authorization - Visit Number  9    Authorization - Number of Visits  18    OT Start Time  1025   Ended early for meeting at 11:00   OT Stop Time  1100    OT Time Calculation (min)  35 min    Activity Tolerance  Patient tolerated treatment well    Behavior During Therapy  [facility] for tasks assessed/performed       Past Medical History:  Diagnosis Date  . Arthritis   . Complication of anesthesia   . Gall stones   . GERD (gastroesophageal reflux disease)   . History of cardiac catheterization 2005   Minor elevation in cardiac enzymes however no significant obstructive CAD  . Hyperlipidemia   . Hypertension   . Insomnia   . PONV (postoperative nausea and vomiting)   . Prediabetes     Past Surgical History:  Procedure Laterality Date  . A-FLUTTER ABLATION N/A 08/01/2017   Procedure: A-FLUTTER ABLATION;  Surgeon: Thompson Grayer, MD;  Location: [facility];  Service: Cardiovascular;  Laterality: N/A;  . ABDOMINAL HYSTERECTOMY    . BACK SURGERY  1992  . CARDIAC CATHETERIZATION     BACK IN 2004  SHE THINKS IT CAME BACK 'NORMAL'  . CHOLECYSTECTOMY N/A 06/24/2015   Procedure: LAPAROSCOPIC CHOLECYSTECTOMY;  Surgeon: Mickeal Skinner, MD;  Location: [facility];  Service: General;  Laterality: N/A;  . COLONOSCOPY N/A 08/30/2012   Procedure: COLONOSCOPY;  Surgeon: Rogene Houston, MD;  [facility];  Service: Endoscopy;  Laterality: N/A;  830  . DILATION AND CURETTAGE OF UTERUS    . ESOPHAGOGASTRODUODENOSCOPY  04/15/2011   Procedure: ESOPHAGOGASTRODUODENOSCOPY (EGD);  Surgeon: Rogene Houston, MD;  [facility];  Service: Endoscopy;  Laterality: N/A;  11:30  . ESOPHAGOGASTRODUODENOSCOPY N/A 05/27/2016   Procedure: ESOPHAGOGASTRODUODENOSCOPY (EGD);  Surgeon: Rogene Houston, MD;  [facility];  Service: Endoscopy;  Laterality: N/A;  11:15  . EYE SURGERY     cataract removal  . NECK SURGERY  12/13/2018  . REVERSE SHOULDER ARTHROPLASTY Left 04/03/2019   Procedure: REVERSE SHOULDER ARTHROPLASTY;  Surgeon: Hiram Gash, MD;  [facility];  Service: Orthopedics;  Laterality: Left;  Marland Kitchen VAGINAL HYSTERECTOMY      There were no vitals filed for this visit.  Subjective Assessment - 06/13/19 1034    Subjective   S: It is a little sore today but not bad.    Currently in Pain?  Yes    Pain Score  2     Pain Location  Shoulder    Pain Orientation  Left    Pain Descriptors / Indicators  Sore    Pain Type  Acute pain    Pain Radiating Towards  N/A    Pain Onset  Today    Pain  Frequency  Occasional    Aggravating Factors   increased use    Pain Relieving Factors  rest    Effect of Pain on Daily Activities  min effect    Multiple Pain Sites  Yes         [facility] OT Assessment - 06/13/19 1035      Assessment   Medical Diagnosis  s/p left reverse TSA      Precautions   Precautions  Shoulder    Type of Shoulder Precautions  See protocol                OT Treatments/Exercises (OP) - 06/13/19 1036      Exercises   Exercises  Shoulder      Shoulder Exercises: Supine   Protraction  PROM;5 reps    Horizontal ABduction  PROM;5 reps;AROM;15 reps    External Rotation  PROM;5 reps    Internal Rotation  PROM;5 reps    Flexion  PROM;5 reps;AROM;15 reps    ABduction  PROM;5 reps      Shoulder Exercises: Standing   Protraction  AROM;12 reps     Horizontal ABduction  AROM;12 reps    External Rotation  AROM;12 reps    Internal Rotation  AROM;12 reps    Flexion  AROM;12 reps    ABduction  AROM;12 reps    Extension  Theraband;10 reps    Theraband Level (Shoulder Extension)  Level 2 (Red)    Row  Theraband;10 reps    Theraband Level (Shoulder Row)  Level 2 (Red)    Retraction  Theraband;10 reps    Theraband Level (Shoulder Retraction)  Level 2 (Red)      Shoulder Exercises: ROM/Strengthening   UBE (Upper Arm Bike)  level 1 2' forward 2' reverse   pace: 5.0   Over Head Lace  2' seated    Prot/Ret//Elev/Dep  1'               OT Short Term Goals - 06/13/19 1156      OT SHORT TERM GOAL #1   Title  Pt will be provided with and educated on HEP to improve mobility in LUE required for ADL completion.    Time  4    Period  Weeks    Status  On-going    Target Date  06/14/19      OT SHORT TERM GOAL #2   Title  Pt will increase LUE P/ROM to [facility] to improve ability to use LUE as assist with dressing tasks.    Time  4    Period  Weeks      OT SHORT TERM GOAL #3   Title  Pt will increase LUE strength to 4-/5 to improve ability to use LUE to reach items above shoulder height.    Time  4    Period  Weeks    Status  On-going        OT Long Term Goals - 05/24/19 1458      OT LONG TERM GOAL #1   Title  Pt will return to highest level of functioning using LUE as non-dominant during ADL completion.    Time  8    Period  Weeks    Status  On-going      OT LONG TERM GOAL #2   Title  Pt will decrease LUE pain to 3/10 or less to improve ability to sleep for 4 consecutive hours or more at night.    Time  8  Period  Weeks    Status  On-going      OT LONG TERM GOAL #3   Title  Pt will decrease LUE fascial restrictions to min amounts or less to improve mobility required for functional reaching tasks.    Time  8    Period  Weeks    Status  On-going      OT LONG TERM GOAL #4   Title  Pt will increase A/ROM in LUE to [facility]  to improve ability to reach into overhead cabinets and behind back.    Time  2    Period  Weeks    Status  On-going      OT LONG TERM GOAL #5   Title  Pt will increase LUE strength to 4+/5 or greater to improve ability to fish with LUE.    Time  8    Period  Weeks    Status  On-going            Plan - 06/13/19 1153    Clinical Impression Statement  A: Focused on mobility and strength during session. Added retraction with red band, overhead lacing, and UBE bike. Worked on scapular mobility with the movement retraction. Frequently demonstrated upper trapezius activation to compensate for scapular and shoulder weakness. Required frequent cueing to remain from shoulder shrugs. Showed improvement with ability to stop shrugging although continues to need work. VC for form and technique were provided throughout session.    Body Structure / Function / Physical Skills  ADL;Endurance;UE functional use;Fascial restriction;Pain;ROM;IADL;Strength    Plan  P: continue to work on scapular strength. Overhead reaching into cabinet work. proximal shoulder strengthening with washcloth on door.    Consulted and Agree with Plan of Care  Patient       Patient will benefit from skilled therapeutic intervention in order to improve the following deficits and impairments:   Body Structure / Function / Physical Skills: ADL, Endurance, UE functional use, Fascial restriction, Pain, ROM, IADL, Strength       Visit Diagnosis: Other symptoms and signs involving the musculoskeletal system  Acute pain of left shoulder  Stiffness of left shoulder, not elsewhere classified    Problem List Patient Active Problem List   Diagnosis Date Noted  . 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  . Insomnia 03/15/2019  . Abdominal pain, epigastric 05/09/2016  . Belching 05/09/2016  . Hyperlipidemia 06/18/2014  . Hyperglycemia 06/18/2014  . Gout 06/18/2014  . Osteopenia 10/28/2013  . Right foot  pain 01/31/2013  . Hypertension 03/29/2011  . High cholesterol 03/29/2011   Ailene Ravel, OTR/L,CBIS  [phone]  06/13/2019, 11:57 AM  [facility] [address] [place], [place], [address] Phone: [phone]   Fax:  [phone]  Name: MAGAN WINNETT MRN: [id number] Date of Birth: [birth year]

## 2019-06-18 ENCOUNTER — Encounter (INDEPENDENT_AMBULATORY_CARE_PROVIDER_SITE_OTHER): Payer: Self-pay | Admitting: Internal Medicine

## 2019-06-18 ENCOUNTER — Other Ambulatory Visit: Payer: Self-pay

## 2019-06-18 ENCOUNTER — Encounter (INDEPENDENT_AMBULATORY_CARE_PROVIDER_SITE_OTHER): Payer: Self-pay | Admitting: *Deleted

## 2019-06-18 ENCOUNTER — Ambulatory Visit (HOSPITAL_COMMUNITY): Payer: Medicare Other | Admitting: Occupational Therapy

## 2019-06-18 ENCOUNTER — Encounter (HOSPITAL_COMMUNITY): Payer: Self-pay | Admitting: Occupational Therapy

## 2019-06-18 ENCOUNTER — Ambulatory Visit (INDEPENDENT_AMBULATORY_CARE_PROVIDER_SITE_OTHER): Payer: Medicare Other | Admitting: Internal Medicine

## 2019-06-18 DIAGNOSIS — K5900 Constipation, unspecified: Secondary | ICD-10-CM | POA: Diagnosis not present

## 2019-06-18 DIAGNOSIS — R490 Dysphonia: Secondary | ICD-10-CM | POA: Diagnosis not present

## 2019-06-18 DIAGNOSIS — M25512 Pain in left shoulder: Secondary | ICD-10-CM

## 2019-06-18 DIAGNOSIS — M25612 Stiffness of left shoulder, not elsewhere classified: Secondary | ICD-10-CM | POA: Diagnosis not present

## 2019-06-18 DIAGNOSIS — R29898 Other symptoms and signs involving the musculoskeletal system: Secondary | ICD-10-CM | POA: Diagnosis not present

## 2019-06-18 DIAGNOSIS — R1319 Other dysphagia: Secondary | ICD-10-CM | POA: Diagnosis not present

## 2019-06-18 MED ORDER — ESOMEPRAZOLE MAGNESIUM 40 MG PO CPDR: 40.0000 mg | DELAYED_RELEASE_CAPSULE | Freq: Every day | ORAL | 3 refills | Status: DC

## 2019-06-18 MED ORDER — DEXLANSOPRAZOLE 60 MG PO CPDR: 60.0000 mg | DELAYED_RELEASE_CAPSULE | Freq: Every day | ORAL | 5 refills | Status: DC

## 2019-06-18 NOTE — Patient Instructions (Signed)
Esophagogram to be scheduled.  Physician will call the results of the study when completed. Please call office with progress report in 8 weeks.

## 2019-06-18 NOTE — Progress Notes (Signed)
Presenting complaint;  Follow-up for hoarseness and constipation Patient complains of dysphagia.  Database and subjective:  Patient is 74 year old Caucasian female with history of hoarseness felt to be due to GERD as well as constipation.  She was last seen in September 2019.  She had tried pantoprazole and was not doing any better.  She was given 2-week supply of Dexilant along with the prescription.  Patient tried Dexilant for 2 weeks but could not tell any difference.  Her prescription was not filled.  Therefore she has not been taking any medication. She has daily hoarseness.  To worsen the end of the day she sometimes cannot say words.  She denies heartburn regurgitation.  She complains of dysphagia she points were throat as site of pill or bolus obstruction.  Sometimes she feel it may be lower down.  She says she is doing better as far as constipation is concerned.  Her bowels generally move every other day.  She takes Senokot no more than once a week.  Since her last visit she has undergone neck surgery.  She had fusion at C5 and C6 in July 2020.  On April 03, 2019 she had left shoulder replacement and is undergoing rehab. She says she tries to walking inside the house every day. Regarding her hoarseness she was evaluated at [facility] back in July 2016.  She she was seen by Dr. Carol Ada of ENT service and had endoscopy.  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  She also was seen by speech pathologist.  She says no therapy has made any difference. Her appetite is good and her weight has been stable.  Current Medications: Outpatient Encounter Medications as of 06/18/2019  Medication Sig  . allopurinol (ZYLOPRIM) 100 MG tablet Take 100 mg by mouth daily.  Marland Kitchen ALPRAZolam (XANAX) 1 MG tablet TAKE ONE TABLET AT BEDTIME AS NEEDED (Patient taking differently: Take 1 mg by mouth at bedtime. TAKE ONE TABLET AT BEDTIME AS NEEDED)  . Biotin 5000 MCG TABS Take 5,000 mcg by mouth daily.  Marland Kitchen docusate sodium (COLACE) 100  MG capsule Take 200 mg by mouth at bedtime.  Marland Kitchen estradiol (ESTRACE) 2 MG tablet Take 2 mg by mouth daily.   . finasteride (PROSCAR) 5 MG tablet Take 2.5 mg by mouth daily.   . minoxidil (ROGAINE) 2 % external solution Apply 1 application topically at bedtime.   Vladimir Faster Glycol-Propyl Glycol (LUBRICANT EYE DROPS) 0.4-0.3 % SOLN Place 1-2 drops into both eyes daily as needed (dry/irritated eyes.).  Marland Kitchen potassium chloride (KLOR-CON) 10 MEQ tablet Take 1 tablet (10 mEq total) by mouth 2 (two) times daily. Pt needs to call and make appt for further refills - 1st attempt (Patient taking differently: Take 20 mEq by mouth daily. Pt needs to call and make appt for further refills - 1st attempt)  . pravastatin (PRAVACHOL) 80 MG tablet 1 qday (Patient taking differently: Take 80 mg by mouth at bedtime. )  . Probiotic Product (PHILLIPS COLON HEALTH PO) Take 1 capsule by mouth daily.  . Sennosides (LAXATIVE) 25 MG TABS Take 25 mg by mouth daily as needed (constipation.).  Marland Kitchen sertraline (ZOLOFT) 50 MG tablet TAKE ONE TABLET (50MG TOTAL) BY MOUTH DAILY (Patient taking differently: Take 50 mg by mouth daily. )  . torsemide (DEMADEX) 20 MG tablet Take 1 tablet (20 mg total) by mouth every morning.   No facility-administered encounter medications on file as of 06/18/2019.     Objective: Blood pressure 117/74, pulse 63, temperature (!) 97  F (36.1 C), temperature source Oral, height 5' 2"  (1.575 m), weight 138 lb 3.2 oz (62.7 kg). Patient is alert and in no acute distress. She is wearing a facial mask. Conjunctiva is pink. Sclera is nonicteric Oropharyngeal mucosa is normal. No neck masses or thyromegaly noted. Cardiac exam with regular rhythm normal S1 and S2. No murmur or gallop noted. Lungs are clear to auscultation. Abdomen is symmetrical soft and nontender with organomegaly or masses. No LE edema or clubbing noted.  Labs/studies Results:  CBC Latest Ref Rng & Units 03/29/2019 03/26/2018 07/24/2017   WBC 4.0 - 10.5 K/uL 5.0 4.9 8.3  Hemoglobin 12.0 - 15.0 g/dL 12.8 13.7 13.2  Hematocrit 36.0 - 46.0 % 41.1 42.6 39.8  Platelets 150 - 400 K/uL 393 389 401(H)    CMP Latest Ref Rng & Units 03/29/2019 03/13/2019 03/26/2018  Glucose 70 - 99 mg/dL 98 88 90  BUN 8 - 23 mg/dL 15 12 14   Creatinine 0.44 - 1.00 mg/dL 0.82 0.83 1.03(H)  Sodium 135 - 145 mmol/L 141 141 141  Potassium 3.5 - 5.1 mmol/L 4.2 3.8 4.6  Chloride 98 - 111 mmol/L 105 98 99  CO2 22 - 32 mmol/L 29 26 27   Calcium 8.9 - 10.3 mg/dL 9.0 9.3 9.7  Total Protein 6.0 - 8.5 g/dL - 6.6 6.6  Total Bilirubin 0.0 - 1.2 mg/dL - 0.4 0.3  Alkaline Phos 39 - 117 IU/L - 83 86  AST 0 - 40 IU/L - 20 25  ALT 0 - 32 IU/L - 11 24    Hepatic Function Latest Ref Rng & Units 03/13/2019 03/26/2018 05/13/2017  Total Protein 6.0 - 8.5 g/dL 6.6 6.6 6.4  Albumin 3.7 - 4.7 g/dL 4.5 4.4 4.2  AST 0 - 40 IU/L 20 25 18   ALT 0 - 32 IU/L 11 24 9   Alk Phosphatase 39 - 117 IU/L 83 86 74  Total Bilirubin 0.0 - 1.2 mg/dL 0.4 0.3 0.3  Bilirubin, Direct 0.00 - 0.40 mg/dL 0.11 0.06 0.10     Assessment:  #1.  Chronic hoarseness.  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  However she has not responded to at least 2 or 3 different PPIs.  Dexilant/dexlansoprazole is not covered under her plan.  Therefore would try her on esomeprazole at a high dose and see what happens.  #2.  Chronic constipation.  She is doing well with dietary measures stool softener and as needed OTC laxative.  She is up-to-date on screening for CRC.  Last exam was in March 2014 with removal of 2 small tubular adenomas and she opted to wait 10 years instead of 7 years.  Family history is negative for CRC.  #3.  Dysphagia.  She appears to have pharyngeal and esophageal dysphagia.  Before she is referred to speech pathologist will do barium pill esophagogram first.   Plan:  Esomeprazole 40 mg p.o. twice daily. Barium pill esophagogram. Patient will call with progress report in 8  weeks. Office visit in 1 year or earlier if necessary.

## 2019-06-18 NOTE — Therapy (Signed)
[place], [place], [address] Phone: [phone]   Fax:  [phone]  Occupational Therapy Treatment  Patient Details  Name: Terri Douglas MRN: [id number] Date of Birth: Apr 02, 1946 Referring Provider (OT): [name], [name] (Surgeon-[name])   Encounter Date: 06/18/2019  OT End of Session - 06/18/19 1352    Visit Number  10    Number of Visits  16    Date for OT Re-Evaluation  07/14/19    Authorization Type  1) Medicare A & B; BCBS Supplement    Authorization Time Period  progress note by 18th visit    Authorization - Visit Number  10    Authorization - Number of Visits  18    OT Start Time  1301    OT Stop Time  1344    OT Time Calculation (min)  43 min    Activity Tolerance  Patient tolerated treatment well    Behavior During Therapy  [facility] for tasks assessed/performed       Past Medical History:  Diagnosis Date  . Arthritis   . Complication of anesthesia   . Gall stones   . GERD (gastroesophageal reflux disease)   . History of cardiac catheterization 2005   Minor elevation in cardiac enzymes however no significant obstructive CAD  . Hyperlipidemia   . Hypertension   . Insomnia   . PONV (postoperative nausea and vomiting)   . Prediabetes     Past Surgical History:  Procedure Laterality Date  . A-FLUTTER ABLATION N/A 08/01/2017   Procedure: A-FLUTTER ABLATION;  Surgeon: Thompson Grayer, MD;  Location: [place] [facility];  Service: Cardiovascular;  Laterality: N/A;  . ABDOMINAL HYSTERECTOMY    . BACK SURGERY  1992  . CARDIAC CATHETERIZATION     BACK IN 2004  SHE THINKS IT CAME BACK 'NORMAL'  . CHOLECYSTECTOMY N/A 06/24/2015   Procedure: LAPAROSCOPIC CHOLECYSTECTOMY;  Surgeon: Mickeal Skinner, MD;  Location: [place];  Service: General;  Laterality: N/A;  . COLONOSCOPY N/A 08/30/2012   Procedure: COLONOSCOPY;  Surgeon: Rogene Houston, MD;  [facility];  Service: Endoscopy;   Laterality: N/A;  830  . DILATION AND CURETTAGE OF UTERUS    . ESOPHAGOGASTRODUODENOSCOPY  04/15/2011   Procedure: ESOPHAGOGASTRODUODENOSCOPY (EGD);  Surgeon: Rogene Houston, MD;  [facility];  Service: Endoscopy;  Laterality: N/A;  11:30  . ESOPHAGOGASTRODUODENOSCOPY N/A 05/27/2016   Procedure: ESOPHAGOGASTRODUODENOSCOPY (EGD);  Surgeon: Rogene Houston, MD;  [facility];  Service: Endoscopy;  Laterality: N/A;  11:15  . EYE SURGERY     cataract removal  . NECK SURGERY  12/13/2018  . REVERSE SHOULDER ARTHROPLASTY Left 04/03/2019   Procedure: REVERSE SHOULDER ARTHROPLASTY;  Surgeon: Hiram Gash, MD;  [facility];  Service: Orthopedics;  Laterality: Left;  Marland Kitchen VAGINAL HYSTERECTOMY      There were no vitals filed for this visit.  Subjective Assessment - 06/18/19 1301    Subjective   S: I'm trying to sit straight and keep my shoulders back.    Currently in Pain?  Yes    Pain Score  2     Pain Location  Shoulder    Pain Orientation  Left    Pain Descriptors / Indicators  Sore    Pain Type  Acute pain    Pain Radiating Towards  N/A    Pain Onset  Yesterday    Pain Frequency  Occasional  Aggravating Factors   increased use and exercises    Pain Relieving Factors  rest    Effect of Pain on Daily Activities  min effect    Multiple Pain Sites  No         [facility] OT Assessment - 06/18/19 1300      Assessment   Medical Diagnosis  s/p left reverse TSA      Precautions   Precautions  Shoulder    Type of Shoulder Precautions  See protocol                OT Treatments/Exercises (OP) - 06/18/19 1303      Exercises   Exercises  Shoulder      Shoulder Exercises: Supine   Protraction  PROM;5 reps    Horizontal ABduction  PROM;5 reps;AROM;15 reps    External Rotation  PROM;5 reps    Internal Rotation  PROM;5 reps    Flexion  PROM;5 reps;AROM;15 reps    ABduction  PROM;5 reps      Shoulder Exercises: Standing   Protraction  AROM;12 reps     Horizontal ABduction  AROM;12 reps    External Rotation  AROM;12 reps    Internal Rotation  AROM;12 reps    Flexion  AROM;12 reps    ABduction  AROM;12 reps    Extension  Theraband;10 reps    Theraband Level (Shoulder Extension)  Level 2 (Red)    Row  Theraband;10 reps    Theraband Level (Shoulder Row)  Level 2 (Red)    Retraction  Theraband;10 reps    Theraband Level (Shoulder Retraction)  Level 2 (Red)      Shoulder Exercises: ROM/Strengthening   UBE (Upper Arm Bike)  level 1 2' reverse   pace: 4.5   Over Head Lace  2' seated    X to V Arms  10X    Proximal Shoulder Strengthening, Seated  10X each, no rest breaks    Other ROM/Strengthening Exercises  proximal shoulder strengthening with washcloth on doorway, 1' flexion      Functional Reaching Activities   Mid Level  Pt placed 10 cones on middle shelf of cabinet in flexion, removed in abduction. Mod effort and fatigue      Manual Therapy   Manual Therapy  Edema management;Myofascial release    Manual therapy comments  completed separately from therapeutic exercises    Edema Management  retrograde massage to left hand, forearm, and upper arm to decrease edema and improve LUE mobility.     Myofascial Release  myofascial release to left upper arm, anterior shoulder, trapezius, and scapularis regions to decrease fascial restrictions and increase joint ROM.                OT Short Term Goals - 06/13/19 1156      OT SHORT TERM GOAL #1   Title  Pt will be provided with and educated on HEP to improve mobility in LUE required for ADL completion.    Time  4    Period  Weeks    Status  On-going    Target Date  06/14/19      OT SHORT TERM GOAL #2   Title  Pt will increase LUE P/ROM to [facility] to improve ability to use LUE as assist with dressing tasks.    Time  4    Period  Weeks      OT SHORT TERM GOAL #3   Title  Pt will increase LUE strength to 4-/5 to  improve ability to use LUE to reach items above shoulder height.     Time  4    Period  Weeks    Status  On-going        OT Long Term Goals - 05/24/19 1458      OT LONG TERM GOAL #1   Title  Pt will return to highest level of functioning using LUE as non-dominant during ADL completion.    Time  8    Period  Weeks    Status  On-going      OT LONG TERM GOAL #2   Title  Pt will decrease LUE pain to 3/10 or less to improve ability to sleep for 4 consecutive hours or more at night.    Time  8    Period  Weeks    Status  On-going      OT LONG TERM GOAL #3   Title  Pt will decrease LUE fascial restrictions to min amounts or less to improve mobility required for functional reaching tasks.    Time  8    Period  Weeks    Status  On-going      OT LONG TERM GOAL #4   Title  Pt will increase A/ROM in LUE to [facility] to improve ability to reach into overhead cabinets and behind back.    Time  2    Period  Weeks    Status  On-going      OT LONG TERM GOAL #5   Title  Pt will increase LUE strength to 4+/5 or greater to improve ability to fish with LUE.    Time  8    Period  Weeks    Status  On-going            Plan - 06/18/19 1323    Clinical Impression Statement  A: Pt reports she has been working on keeping her shoulder down during exercises. Continued with edema management and myofascial release today, improvement in muscle knot at anterior shoulder. Continued with A/ROM with focus on trapezius depression during exercises. Added proximal shoulder strengthening on door and functional reaching tasks today. Added x to v arms and functional reaching tasks as well. Verbal cuing for form and technique.    Body Structure / Function / Physical Skills  ADL;Endurance;UE functional use;Fascial restriction;Pain;ROM;IADL;Strength    Plan  P: Continue with functional reaching, continue focus on depressing trapezius.       Patient will benefit from skilled therapeutic intervention in order to improve the following deficits and impairments:   Body Structure /  Function / Physical Skills: ADL, Endurance, UE functional use, Fascial restriction, Pain, ROM, IADL, Strength       Visit Diagnosis: Other symptoms and signs involving the musculoskeletal system  Acute pain of left shoulder  Stiffness of left shoulder, not elsewhere classified    Problem List Patient Active Problem List   Diagnosis Date Noted  . Hoarseness   . Constipation   . Other dysphagia   . 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  . Insomnia 03/15/2019  . Abdominal pain, epigastric 05/09/2016  . Belching 05/09/2016  . Hyperlipidemia 06/18/2014  . Hyperglycemia 06/18/2014  . Gout 06/18/2014  . Osteopenia 10/28/2013  . Right foot pain 01/31/2013  . Hypertension 03/29/2011  . High cholesterol 03/29/2011   Guadelupe Sabin, OTR/L  [phone] 06/18/2019, 1:53 PM  [place] [address] [place], [place], [address] Phone: [phone]   Fax:  [phone]  Name: Terri Douglas MRN: [id number] Date of Birth: Aug 15, 1945

## 2019-06-19 ENCOUNTER — Inpatient Hospital Stay: Admit: 2019-06-19 | Payer: MEDICARE | Attending: Family Medicine | Primary: Physician Assistant

## 2019-06-19 DIAGNOSIS — Z1231 Encounter for screening mammogram for malignant neoplasm of breast: Secondary | ICD-10-CM

## 2019-06-20 ENCOUNTER — Ambulatory Visit (HOSPITAL_COMMUNITY): Payer: Medicare Other

## 2019-06-21 ENCOUNTER — Other Ambulatory Visit: Payer: Self-pay

## 2019-06-21 ENCOUNTER — Ambulatory Visit (HOSPITAL_COMMUNITY)
Admission: RE | Admit: 2019-06-21 | Discharge: 2019-06-21 | Disposition: A | Discharge status: Home / Self Care | Payer: Medicare Other | Source: Ambulatory Visit | Attending: Internal Medicine | Admitting: Internal Medicine

## 2019-06-21 DIAGNOSIS — K449 Diaphragmatic hernia without obstruction or gangrene: Secondary | ICD-10-CM | POA: Diagnosis not present

## 2019-06-21 DIAGNOSIS — R1319 Other dysphagia: Secondary | ICD-10-CM | POA: Insufficient documentation

## 2019-06-25 ENCOUNTER — Encounter (HOSPITAL_COMMUNITY): Payer: Self-pay | Admitting: Occupational Therapy

## 2019-06-25 ENCOUNTER — Ambulatory Visit (HOSPITAL_COMMUNITY): Payer: Medicare Other | Admitting: Occupational Therapy

## 2019-06-25 ENCOUNTER — Other Ambulatory Visit: Payer: Self-pay

## 2019-06-25 DIAGNOSIS — R29898 Other symptoms and signs involving the musculoskeletal system: Secondary | ICD-10-CM | POA: Diagnosis not present

## 2019-06-25 DIAGNOSIS — M25612 Stiffness of left shoulder, not elsewhere classified: Secondary | ICD-10-CM

## 2019-06-25 DIAGNOSIS — M25512 Pain in left shoulder: Secondary | ICD-10-CM

## 2019-06-25 NOTE — Therapy (Signed)
[place], [place], [address] Phone: [phone]   Fax:  [phone]  Occupational Therapy Treatment  Patient Details  Name: Terri Douglas MRN: [id number] Date of Birth: 11-15-1945 Referring Provider (OT): [name], [name] (Surgeon-[name])   Encounter Date: 06/25/2019  OT End of Session - 06/25/19 1029    Visit Number  11    Number of Visits  16    Date for OT Re-Evaluation  07/14/19    Authorization Type  1) Medicare A & B; BCBS Supplement    Authorization Time Period  progress note by 18th visit    Authorization - Visit Number  11    Authorization - Number of Visits  18    OT Start Time  0945    OT Stop Time  1028    OT Time Calculation (min)  43 min    Activity Tolerance  Patient tolerated treatment well    Behavior During Therapy  WFL for tasks assessed/performed       Past Medical History:  Diagnosis Date  . Arthritis   . Complication of anesthesia   . Gall stones   . GERD (gastroesophageal reflux disease)   . History of cardiac catheterization 2005   Minor elevation in cardiac enzymes however no significant obstructive CAD  . Hyperlipidemia   . Hypertension   . Insomnia   . PONV (postoperative nausea and vomiting)   . Prediabetes     Past Surgical History:  Procedure Laterality Date  . A-FLUTTER ABLATION N/A 08/01/2017   Procedure: A-FLUTTER ABLATION;  Surgeon: Thompson Grayer, MD;  Location: [place] [facility];  Service: Cardiovascular;  Laterality: N/A;  . ABDOMINAL HYSTERECTOMY    . BACK SURGERY  1992  . CARDIAC CATHETERIZATION     BACK IN 2004  SHE THINKS IT CAME BACK 'NORMAL'  . CHOLECYSTECTOMY N/A 06/24/2015   Procedure: LAPAROSCOPIC CHOLECYSTECTOMY;  Surgeon: Mickeal Skinner, MD;  Location: [place];  Service: General;  Laterality: N/A;  . COLONOSCOPY N/A 08/30/2012   Procedure: COLONOSCOPY;  Surgeon: Rogene Houston, MD;  [facility];  Service: Endoscopy;   Laterality: N/A;  830  . DILATION AND CURETTAGE OF UTERUS    . ESOPHAGOGASTRODUODENOSCOPY  04/15/2011   Procedure: ESOPHAGOGASTRODUODENOSCOPY (EGD);  Surgeon: Rogene Houston, MD;  [facility];  Service: Endoscopy;  Laterality: N/A;  11:30  . ESOPHAGOGASTRODUODENOSCOPY N/A 05/27/2016   Procedure: ESOPHAGOGASTRODUODENOSCOPY (EGD);  Surgeon: Rogene Houston, MD;  [facility];  Service: Endoscopy;  Laterality: N/A;  11:15  . EYE SURGERY     cataract removal  . NECK SURGERY  12/13/2018  . REVERSE SHOULDER ARTHROPLASTY Left 04/03/2019   Procedure: REVERSE SHOULDER ARTHROPLASTY;  Surgeon: Hiram Gash, MD;  [facility];  Service: Orthopedics;  Laterality: Left;  Marland Kitchen VAGINAL HYSTERECTOMY      There were no vitals filed for this visit.  Subjective Assessment - 06/25/19 0945    Subjective   S: I got a coat on that I haven't been able to get until now.    Currently in Pain?  No/denies         [facility] OT Assessment - 06/25/19 0945      Assessment   Medical Diagnosis  s/p left reverse TSA      Precautions   Precautions  Shoulder    Type of Shoulder Precautions  See protocol  OT Treatments/Exercises (OP) - 06/25/19 0947      Exercises   Exercises  Shoulder      Shoulder Exercises: Supine   Protraction  PROM;5 reps;Strengthening;10 reps    Protraction Weight (lbs)  1    Horizontal ABduction  PROM;5 reps;Strengthening;10 reps    Horizontal ABduction Weight (lbs)  1    External Rotation  PROM;5 reps;Strengthening;10 reps    External Rotation Weight (lbs)  1    Internal Rotation  PROM;5 reps;Strengthening;10 reps    Internal Rotation Weight (lbs)  1    Flexion  PROM;5 reps;Strengthening;10 reps    Shoulder Flexion Weight (lbs)  1    ABduction  PROM;5 reps    Shoulder ABduction Weight (lbs)  --      Shoulder Exercises: Standing   Protraction  Strengthening;10 reps    Protraction Weight (lbs)  1    Horizontal ABduction  AROM;12 reps     External Rotation  Strengthening;10 reps    External Rotation Weight (lbs)  1    Internal Rotation  Strengthening;10 reps    Internal Rotation Weight (lbs)  1    Flexion  AROM;12 reps    ABduction  AROM;12 reps      Shoulder Exercises: ROM/Strengthening   X to V Arms  10X    Other ROM/Strengthening Exercises  proximal shoulder strengthening with washcloth on doorway, 1' flexion, 1' abduction      Functional Reaching Activities   Mid Level  Pt placed 10 cones on middle shelf of cabinet in flexion, removed in abduction. Mod effort and fatigue      Manual Therapy   Manual Therapy  Edema management;Myofascial release    Manual therapy comments  completed separately from therapeutic exercises    Edema Management  retrograde massage to left hand, forearm, and upper arm to decrease edema and improve LUE mobility.     Myofascial Release  myofascial release to left upper arm, anterior shoulder, trapezius, and scapularis regions to decrease fascial restrictions and increase joint ROM.              OT Education - 06/25/19 1012    Education Details  scapular theraband-red    Person(s) Educated  Patient    Methods  Explanation;Demonstration;Verbal cues;Handout    Comprehension  Verbalized understanding;Returned demonstration       OT Short Term Goals - 06/13/19 1156      OT SHORT TERM GOAL #1   Title  Pt will be provided with and educated on HEP to improve mobility in LUE required for ADL completion.    Time  4    Period  Weeks    Status  On-going    Target Date  06/14/19      OT SHORT TERM GOAL #2   Title  Pt will increase LUE P/ROM to [facility] to improve ability to use LUE as assist with dressing tasks.    Time  4    Period  Weeks      OT SHORT TERM GOAL #3   Title  Pt will increase LUE strength to 4-/5 to improve ability to use LUE to reach items above shoulder height.    Time  4    Period  Weeks    Status  On-going        OT Long Term Goals - 05/24/19 1458      OT  LONG TERM GOAL #1   Title  Pt will return to highest level of functioning using LUE as  non-dominant during ADL completion.    Time  8    Period  Weeks    Status  On-going      OT LONG TERM GOAL #2   Title  Pt will decrease LUE pain to 3/10 or less to improve ability to sleep for 4 consecutive hours or more at night.    Time  8    Period  Weeks    Status  On-going      OT LONG TERM GOAL #3   Title  Pt will decrease LUE fascial restrictions to min amounts or less to improve mobility required for functional reaching tasks.    Time  8    Period  Weeks    Status  On-going      OT LONG TERM GOAL #4   Title  Pt will increase A/ROM in LUE to [facility] to improve ability to reach into overhead cabinets and behind back.    Time  2    Period  Weeks    Status  On-going      OT LONG TERM GOAL #5   Title  Pt will increase LUE strength to 4+/5 or greater to improve ability to fish with LUE.    Time  8    Period  Weeks    Status  On-going            Plan - 06/25/19 1024    Clinical Impression Statement  A: Pt reports she has been practicing putting items away in her cabinets and completing HEP 3x/day and uses a mirror. Pt with less edema today, able to get into tighter shirt sleeves now. Progressed to strengthening in supine with min difficulty, unable to complete abduction in supine due to catching in upper arm. Added abduction proximal shoulder strengthening on doorway, continued with functional reaching task today. Updated HEP for scapular theraband. Verbal cuing for form and technique, occasional reminders to depress trapezius.    Body Structure / Function / Physical Skills  ADL;Endurance;UE functional use;Fascial restriction;Pain;ROM;IADL;Strength    Plan  P: Follow up on HEP, continue with functional reaching and progressing to shoulder strengthening. Ball on wall, UBE       Patient will benefit from skilled therapeutic intervention in order to improve the following deficits and  impairments:   Body Structure / Function / Physical Skills: ADL, Endurance, UE functional use, Fascial restriction, Pain, ROM, IADL, Strength       Visit Diagnosis: Other symptoms and signs involving the musculoskeletal system  Acute pain of left shoulder  Stiffness of left shoulder, not elsewhere classified    Problem List Patient Active Problem List   Diagnosis Date Noted  . Hoarseness   . Constipation   . Other dysphagia   . 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  . Insomnia 03/15/2019  . Abdominal pain, epigastric 05/09/2016  . Belching 05/09/2016  . Hyperlipidemia 06/18/2014  . Hyperglycemia 06/18/2014  . Gout 06/18/2014  . Osteopenia 10/28/2013  . Right foot pain 01/31/2013  . Hypertension 03/29/2011  . High cholesterol 03/29/2011   Guadelupe Sabin, OTR/L  [phone] 06/25/2019, 10:30 AM  [place] [address] [place], [place], [address] Phone: [phone]   Fax:  [phone]  Name: DARCHELLE LANGILLE MRN: [id number] Date of Birth: [birth year]

## 2019-06-25 NOTE — Patient Instructions (Signed)

## 2019-06-27 ENCOUNTER — Encounter (HOSPITAL_COMMUNITY): Payer: Self-pay

## 2019-06-27 ENCOUNTER — Other Ambulatory Visit: Payer: Self-pay

## 2019-06-27 ENCOUNTER — Ambulatory Visit (HOSPITAL_COMMUNITY): Payer: Medicare Other

## 2019-06-27 DIAGNOSIS — M25612 Stiffness of left shoulder, not elsewhere classified: Secondary | ICD-10-CM

## 2019-06-27 DIAGNOSIS — R29898 Other symptoms and signs involving the musculoskeletal system: Secondary | ICD-10-CM | POA: Diagnosis not present

## 2019-06-27 DIAGNOSIS — M25512 Pain in left shoulder: Secondary | ICD-10-CM | POA: Diagnosis not present

## 2019-06-27 NOTE — Therapy (Signed)
[place], [place], [address] Phone: [phone]   Fax:  [phone]  Occupational Therapy Treatment  Patient Details  Name: Terri Douglas MRN: [id number] Date of Birth: 02/23/1946 Referring Provider (OT): [name], [name] (Surgeon-[name])   Encounter Date: 06/27/2019  OT End of Session - 06/27/19 0938    Visit Number  12    Number of Visits  16    Date for OT Re-Evaluation  07/14/19    Authorization Type  1) Medicare A & B; BCBS Supplement    Authorization Time Period  progress note by 18th visit    Authorization - Visit Number  12    Authorization - Number of Visits  18    OT Start Time  [phone]    OT Stop Time  [phone]    OT Time Calculation (min)  38 min    Activity Tolerance  Patient tolerated treatment well    Behavior During Therapy  [facility] for tasks assessed/performed       Past Medical History:  Diagnosis Date  . Arthritis   . Complication of anesthesia   . Gall stones   . GERD (gastroesophageal reflux disease)   . History of cardiac catheterization 2005   Minor elevation in cardiac enzymes however no significant obstructive CAD  . Hyperlipidemia   . Hypertension   . Insomnia   . PONV (postoperative nausea and vomiting)   . Prediabetes     Past Surgical History:  Procedure Laterality Date  . A-FLUTTER ABLATION N/A 08/01/2017   Procedure: A-FLUTTER ABLATION;  Surgeon: Thompson Grayer, MD;  Location: [place] [facility];  Service: Cardiovascular;  Laterality: N/A;  . ABDOMINAL HYSTERECTOMY    . BACK SURGERY  1992  . CARDIAC CATHETERIZATION     BACK IN 2004  SHE THINKS IT CAME BACK 'NORMAL'  . CHOLECYSTECTOMY N/A 06/24/2015   Procedure: LAPAROSCOPIC CHOLECYSTECTOMY;  Surgeon: Mickeal Skinner, MD;  Location: [place];  Service: General;  Laterality: N/A;  . COLONOSCOPY N/A 08/30/2012   Procedure: COLONOSCOPY;  Surgeon: Rogene Houston, MD;  [facility];  Service: Endoscopy;   Laterality: N/A;  830  . DILATION AND CURETTAGE OF UTERUS    . ESOPHAGOGASTRODUODENOSCOPY  04/15/2011   Procedure: ESOPHAGOGASTRODUODENOSCOPY (EGD);  Surgeon: Rogene Houston, MD;  [facility];  Service: Endoscopy;  Laterality: N/A;  11:30  . ESOPHAGOGASTRODUODENOSCOPY N/A 05/27/2016   Procedure: ESOPHAGOGASTRODUODENOSCOPY (EGD);  Surgeon: Rogene Houston, MD;  [facility];  Service: Endoscopy;  Laterality: N/A;  11:15  . EYE SURGERY     cataract removal  . NECK SURGERY  12/13/2018  . REVERSE SHOULDER ARTHROPLASTY Left 04/03/2019   Procedure: REVERSE SHOULDER ARTHROPLASTY;  Surgeon: Hiram Gash, MD;  [facility];  Service: Orthopedics;  Laterality: Left;  Marland Kitchen VAGINAL HYSTERECTOMY      There were no vitals filed for this visit.  Subjective Assessment - 06/27/19 0925    Subjective   S: I'm able to do my exercises 3 times a day now.    Currently in Pain?  No/denies         [facility] OT Assessment - 06/27/19 0926      Assessment   Medical Diagnosis  s/p left reverse TSA      Precautions   Precautions  Shoulder    Type of Shoulder Precautions  See protocol  OT Treatments/Exercises (OP) - 06/27/19 0926      Exercises   Exercises  Shoulder      Shoulder Exercises: Supine   Protraction  PROM;5 reps;Strengthening;15 reps    Protraction Weight (lbs)  1    Horizontal ABduction  PROM;5 reps;Strengthening;15 reps    Horizontal ABduction Weight (lbs)  1    External Rotation  PROM;5 reps;Strengthening;15 reps    External Rotation Weight (lbs)  1    Internal Rotation  PROM;5 reps;Strengthening;15 reps    Internal Rotation Weight (lbs)  1    Flexion  PROM;5 reps;Strengthening;15 reps    Shoulder Flexion Weight (lbs)  1    ABduction  PROM;5 reps      Shoulder Exercises: Standing   Protraction  Strengthening;10 reps    Protraction Weight (lbs)  1    Horizontal ABduction  AROM;15 reps    External Rotation  Strengthening;10 reps     External Rotation Weight (lbs)  1    Internal Rotation  Strengthening;10 reps    Internal Rotation Weight (lbs)  1    Flexion  Strengthening;10 reps    Shoulder Flexion Weight (lbs)  1    ABduction  AROM;12 reps      Shoulder Exercises: ROM/Strengthening   X to V Arms  12X    Ball on Wall  1' flexion 1' abduction green ball      Manual Therapy   Manual Therapy  Myofascial release    Manual therapy comments  completed separately from therapeutic exercises    Myofascial Release  myofascial release to left upper arm, anterior shoulder, trapezius, and scapularis regions to decrease fascial restrictions and increase joint ROM.                OT Short Term Goals - 06/13/19 1156      OT SHORT TERM GOAL #1   Title  Pt will be provided with and educated on HEP to improve mobility in LUE required for ADL completion.    Time  4    Period  Weeks    Status  On-going    Target Date  06/14/19      OT SHORT TERM GOAL #2   Title  Pt will increase LUE P/ROM to [facility] to improve ability to use LUE as assist with dressing tasks.    Time  4    Period  Weeks      OT SHORT TERM GOAL #3   Title  Pt will increase LUE strength to 4-/5 to improve ability to use LUE to reach items above shoulder height.    Time  4    Period  Weeks    Status  On-going        OT Long Term Goals - 05/24/19 1458      OT LONG TERM GOAL #1   Title  Pt will return to highest level of functioning using LUE as non-dominant during ADL completion.    Time  8    Period  Weeks    Status  On-going      OT LONG TERM GOAL #2   Title  Pt will decrease LUE pain to 3/10 or less to improve ability to sleep for 4 consecutive hours or more at night.    Time  8    Period  Weeks    Status  On-going      OT LONG TERM GOAL #3   Title  Pt will decrease LUE fascial restrictions to min amounts or  less to improve mobility required for functional reaching tasks.    Time  8    Period  Weeks    Status  On-going      OT LONG  TERM GOAL #4   Title  Pt will increase A/ROM in LUE to [facility] to improve ability to reach into overhead cabinets and behind back.    Time  2    Period  Weeks    Status  On-going      OT LONG TERM GOAL #5   Title  Pt will increase LUE strength to 4+/5 or greater to improve ability to fish with LUE.    Time  8    Period  Weeks    Status  On-going            Plan - 06/27/19 [id number]    Clinical Impression Statement  A: Increased supine strengthening repetitions as able to tolerate. Patient continues to report pain during downward motion of supine flexion and abduction. Unable to tolerate A/ROM or weighted abduction supine although able to complete standing with increased comfort. Added ball on the wall to increase shoulder stability. VC for form and technique were provided. Manual techniques were completed to address fascial restrictions in the LUE.    Body Structure / Function / Physical Skills  ADL;Endurance;UE functional use;Fascial restriction;Pain;ROM;IADL;Strength    Plan  P: Add UBE bike and continue with functional reaching tasks.    Consulted and Agree with Plan of Care  Patient       Patient will benefit from skilled therapeutic intervention in order to improve the following deficits and impairments:   Body Structure / Function / Physical Skills: ADL, Endurance, UE functional use, Fascial restriction, Pain, ROM, IADL, Strength       Visit Diagnosis: Other symptoms and signs involving the musculoskeletal system  Acute pain of left shoulder  Stiffness of left shoulder, not elsewhere classified    Problem List Patient Active Problem List   Diagnosis Date Noted  . Hoarseness   . Constipation   . Other dysphagia   . 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  . Insomnia 03/15/2019  . Abdominal pain, epigastric 05/09/2016  . Belching 05/09/2016  . Hyperlipidemia 06/18/2014  . Hyperglycemia 06/18/2014  . Gout 06/18/2014  . Osteopenia 10/28/2013  . Right  foot pain 01/31/2013  . Hypertension 03/29/2011  . High cholesterol 03/29/2011     Ailene Ravel, OTR/L,CBIS  [phone]  06/27/2019, 9:46 AM  [place] [address] [place], [place], [address] Phone: [phone]   Fax:  [phone]  Name: Terri Douglas MRN: [id number] Date of Birth: 24-Jun-1945

## 2019-07-02 DIAGNOSIS — M25522 Pain in left elbow: Secondary | ICD-10-CM | POA: Diagnosis not present

## 2019-07-02 DIAGNOSIS — M25512 Pain in left shoulder: Secondary | ICD-10-CM | POA: Diagnosis not present

## 2019-07-03 ENCOUNTER — Other Ambulatory Visit: Payer: Self-pay

## 2019-07-03 ENCOUNTER — Encounter (HOSPITAL_COMMUNITY): Payer: Self-pay

## 2019-07-03 ENCOUNTER — Ambulatory Visit (HOSPITAL_COMMUNITY): Payer: Medicare Other

## 2019-07-03 DIAGNOSIS — R29898 Other symptoms and signs involving the musculoskeletal system: Secondary | ICD-10-CM

## 2019-07-03 DIAGNOSIS — M25612 Stiffness of left shoulder, not elsewhere classified: Secondary | ICD-10-CM | POA: Diagnosis not present

## 2019-07-03 DIAGNOSIS — M25512 Pain in left shoulder: Secondary | ICD-10-CM | POA: Diagnosis not present

## 2019-07-03 NOTE — Therapy (Signed)
[place], [place], [address] Phone: [phone]   Fax:  [phone]  Occupational Therapy Treatment  Patient Details  Name: Terri Douglas MRN: [id number] Date of Birth: 02/27/1946 Referring Provider (OT): [name], [name] (Surgeon-[name])   Encounter Date: 07/03/2019  OT End of Session - 07/03/19 1152    Visit Number  13    Number of Visits  16    Date for OT Re-Evaluation  07/14/19    Authorization Type  1) Medicare A & B; BCBS Supplement    Authorization Time Period  progress note by 18th visit    Authorization - Visit Number  13    Authorization - Number of Visits  18    OT Start Time  1116    OT Stop Time  1154    OT Time Calculation (min)  38 min    Activity Tolerance  Patient tolerated treatment well    Behavior During Therapy  WFL for tasks assessed/performed       Past Medical History:  Diagnosis Date  . Arthritis   . Complication of anesthesia   . Gall stones   . GERD (gastroesophageal reflux disease)   . History of cardiac catheterization 2005   Minor elevation in cardiac enzymes however no significant obstructive CAD  . Hyperlipidemia   . Hypertension   . Insomnia   . PONV (postoperative nausea and vomiting)   . Prediabetes     Past Surgical History:  Procedure Laterality Date  . A-FLUTTER ABLATION N/A 08/01/2017   Procedure: A-FLUTTER ABLATION;  Surgeon: Thompson Grayer, MD;  Location: [place] [facility];  Service: Cardiovascular;  Laterality: N/A;  . ABDOMINAL HYSTERECTOMY    . BACK SURGERY  1992  . CARDIAC CATHETERIZATION     BACK IN 2004  SHE THINKS IT CAME BACK 'NORMAL'  . CHOLECYSTECTOMY N/A 06/24/2015   Procedure: LAPAROSCOPIC CHOLECYSTECTOMY;  Surgeon: Mickeal Skinner, MD;  Location: [place];  Service: General;  Laterality: N/A;  . COLONOSCOPY N/A 08/30/2012   Procedure: COLONOSCOPY;  Surgeon: Rogene Houston, MD;  [facility];  Service: Endoscopy;   Laterality: N/A;  830  . DILATION AND CURETTAGE OF UTERUS    . ESOPHAGOGASTRODUODENOSCOPY  04/15/2011   Procedure: ESOPHAGOGASTRODUODENOSCOPY (EGD);  Surgeon: Rogene Houston, MD;  [facility];  Service: Endoscopy;  Laterality: N/A;  11:30  . ESOPHAGOGASTRODUODENOSCOPY N/A 05/27/2016   Procedure: ESOPHAGOGASTRODUODENOSCOPY (EGD);  Surgeon: Rogene Houston, MD;  [facility];  Service: Endoscopy;  Laterality: N/A;  11:15  . EYE SURGERY     cataract removal  . NECK SURGERY  12/13/2018  . REVERSE SHOULDER ARTHROPLASTY Left 04/03/2019   Procedure: REVERSE SHOULDER ARTHROPLASTY;  Surgeon: Hiram Gash, MD;  [facility];  Service: Orthopedics;  Laterality: Left;  Marland Kitchen VAGINAL HYSTERECTOMY      There were no vitals filed for this visit.  Subjective Assessment - 07/03/19 1119    Subjective   S: I went for my 3 month check up on my shoulder yesterday and he said everything looks good.    Currently in Pain?  No/denies         [facility] OT Assessment - 07/03/19 1119      Assessment   Medical Diagnosis  s/p left reverse TSA    Next MD Visit  10/04/19      Precautions   Precautions  Shoulder    Type of Shoulder Precautions  See protocol                OT Treatments/Exercises (OP) - 07/03/19 1131      Exercises   Exercises  Shoulder      Shoulder Exercises: Supine   Protraction  PROM;5 reps    Horizontal ABduction  PROM;5 reps;Strengthening;15 reps    Horizontal ABduction Weight (lbs)  1    External Rotation  PROM;5 reps;Strengthening;15 reps    External Rotation Weight (lbs)  1    Internal Rotation  PROM;5 reps;Strengthening;15 reps    Internal Rotation Weight (lbs)  1    Flexion  PROM;5 reps;Strengthening;15 reps    Shoulder Flexion Weight (lbs)  1    ABduction  PROM;5 reps      Shoulder Exercises: Standing   Protraction  Strengthening;12 reps    Protraction Weight (lbs)  1    Horizontal ABduction  AROM;15 reps    External Rotation   Strengthening;10 reps    External Rotation Weight (lbs)  1    Internal Rotation  Strengthening;10 reps    Internal Rotation Weight (lbs)  1    Flexion  Strengthening;10 reps    Shoulder Flexion Weight (lbs)  1    ABduction  AROM;12 reps      Shoulder Exercises: ROM/Strengthening   UBE (Upper Arm Bike)  Level 2 3' reverse   pace: 7.0-8.0   X to V Arms  12X      Functional Reaching Activities   Mid Level  Pt placed 10 cones on middle shelf of cabinet in flexion, removed in abduction. Mod effort and fatigue       Manual Therapy   Manual Therapy  Myofascial release    Manual therapy comments  completed separately from therapeutic exercises    Myofascial Release  myofascial release to left upper arm, anterior shoulder, trapezius, and scapularis regions to decrease fascial restrictions and increase joint ROM.                OT Short Term Goals - 06/13/19 1156      OT SHORT TERM GOAL #1   Title  Pt will be provided with and educated on HEP to improve mobility in LUE required for ADL completion.    Time  4    Period  Weeks    Status  On-going    Target Date  06/14/19      OT SHORT TERM GOAL #2   Title  Pt will increase LUE P/ROM to [facility] to improve ability to use LUE as assist with dressing tasks.    Time  4    Period  Weeks      OT SHORT TERM GOAL #3   Title  Pt will increase LUE strength to 4-/5 to improve ability to use LUE to reach items above shoulder height.    Time  4    Period  Weeks    Status  On-going        OT Long Term Goals - 05/24/19 1458      OT LONG TERM GOAL #1   Title  Pt will return to highest level of functioning using LUE as non-dominant during ADL completion.    Time  8    Period  Weeks    Status  On-going      OT LONG TERM GOAL #2   Title  Pt will decrease LUE pain to 3/10 or less to improve ability to sleep for 4 consecutive hours or more at night.  Time  8    Period  Weeks    Status  On-going      OT LONG TERM GOAL #3   Title  Pt  will decrease LUE fascial restrictions to min amounts or less to improve mobility required for functional reaching tasks.    Time  8    Period  Weeks    Status  On-going      OT LONG TERM GOAL #4   Title  Pt will increase A/ROM in LUE to [facility] to improve ability to reach into overhead cabinets and behind back.    Time  2    Period  Weeks    Status  On-going      OT LONG TERM GOAL #5   Title  Pt will increase LUE strength to 4+/5 or greater to improve ability to fish with LUE.    Time  8    Period  Weeks    Status  On-going            Plan - 07/03/19 1208    Clinical Impression Statement  A: Continued with shoulder strengthening using 1# hand weight supine and standing. Continues to be unable to complete supine abduction with 1# handweight due to pain. pt was able to complete A/ROM abduction standing. VC for form and technique were provided. Manual techniques were performed to address fascial restrictions in the left anterior shoulder region. Progressed resistance on UBE bike to focus on scapular strengthening.    Body Structure / Function / Physical Skills  ADL;Endurance;UE functional use;Fascial restriction;Pain;ROM;IADL;Strength    Plan  P: red scapular theraband exercises.    Consulted and Agree with Plan of Care  Patient       Patient will benefit from skilled therapeutic intervention in order to improve the following deficits and impairments:   Body Structure / Function / Physical Skills: ADL, Endurance, UE functional use, Fascial restriction, Pain, ROM, IADL, Strength       Visit Diagnosis: Acute pain of left shoulder  Other symptoms and signs involving the musculoskeletal system  Stiffness of left shoulder, not elsewhere classified    Problem List Patient Active Problem List   Diagnosis Date Noted  . Hoarseness   . Constipation   . Other dysphagia   . 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  . Insomnia 03/15/2019  . Abdominal pain,  epigastric 05/09/2016  . Belching 05/09/2016  . Hyperlipidemia 06/18/2014  . Hyperglycemia 06/18/2014  . Gout 06/18/2014  . Osteopenia 10/28/2013  . Right foot pain 01/31/2013  . Hypertension 03/29/2011  . High cholesterol 03/29/2011   Ailene Ravel, OTR/L,CBIS  [phone]  07/03/2019, 12:59 PM  [place] [address] [place], [place], [address] Phone: [phone]   Fax:  [phone]  Name: DANYEAL GAMEL MRN: [id number] Date of Birth: [birth year]

## 2019-07-05 ENCOUNTER — Other Ambulatory Visit: Payer: Self-pay

## 2019-07-05 ENCOUNTER — Ambulatory Visit (HOSPITAL_COMMUNITY): Payer: Medicare Other

## 2019-07-05 DIAGNOSIS — R29898 Other symptoms and signs involving the musculoskeletal system: Secondary | ICD-10-CM | POA: Diagnosis not present

## 2019-07-05 DIAGNOSIS — M25512 Pain in left shoulder: Secondary | ICD-10-CM | POA: Diagnosis not present

## 2019-07-05 DIAGNOSIS — M25612 Stiffness of left shoulder, not elsewhere classified: Secondary | ICD-10-CM | POA: Diagnosis not present

## 2019-07-05 NOTE — Therapy (Signed)
[place], [place], [address] Phone: [phone]   Fax:  [phone]  Occupational Therapy Treatment  Patient Details  Name: Terri Douglas MRN: [id number] Date of Birth: 1946/04/26 Referring Provider (OT): [name], [name] (Surgeon-[name])   Encounter Date: 07/05/2019  OT End of Session - 07/05/19 0100    Visit Number  14    Number of Visits  16    Date for OT Re-Evaluation  07/14/19    Authorization Type  1) Medicare A & B; BCBS Supplement    Authorization Time Period  progress note by 18th visit    Authorization - Visit Number  14    Authorization - Number of Visits  18    OT Start Time  0815    OT Stop Time  [id number]    OT Time Calculation (min)  42 min    Activity Tolerance  Patient tolerated treatment well    Behavior During Therapy  [facility] for tasks assessed/performed       Past Medical History:  Diagnosis Date  . Arthritis   . Complication of anesthesia   . Gall stones   . GERD (gastroesophageal reflux disease)   . History of cardiac catheterization 2005   Minor elevation in cardiac enzymes however no significant obstructive CAD  . Hyperlipidemia   . Hypertension   . Insomnia   . PONV (postoperative nausea and vomiting)   . Prediabetes     Past Surgical History:  Procedure Laterality Date  . A-FLUTTER ABLATION N/A 08/01/2017   Procedure: A-FLUTTER ABLATION;  Surgeon: Thompson Grayer, MD;  Location: [place] [facility];  Service: Cardiovascular;  Laterality: N/A;  . ABDOMINAL HYSTERECTOMY    . BACK SURGERY  1992  . CARDIAC CATHETERIZATION     BACK IN 2004  SHE THINKS IT CAME BACK 'NORMAL'  . CHOLECYSTECTOMY N/A 06/24/2015   Procedure: LAPAROSCOPIC CHOLECYSTECTOMY;  Surgeon: Mickeal Skinner, MD;  Location: [place];  Service: General;  Laterality: N/A;  . COLONOSCOPY N/A 08/30/2012   Procedure: COLONOSCOPY;  Surgeon: Rogene Houston, MD;  [facility];  Service: Endoscopy;   Laterality: N/A;  830  . DILATION AND CURETTAGE OF UTERUS    . ESOPHAGOGASTRODUODENOSCOPY  04/15/2011   Procedure: ESOPHAGOGASTRODUODENOSCOPY (EGD);  Surgeon: Rogene Houston, MD;  [facility];  Service: Endoscopy;  Laterality: N/A;  11:30  . ESOPHAGOGASTRODUODENOSCOPY N/A 05/27/2016   Procedure: ESOPHAGOGASTRODUODENOSCOPY (EGD);  Surgeon: Rogene Houston, MD;  [facility];  Service: Endoscopy;  Laterality: N/A;  11:15  . EYE SURGERY     cataract removal  . NECK SURGERY  12/13/2018  . REVERSE SHOULDER ARTHROPLASTY Left 04/03/2019   Procedure: REVERSE SHOULDER ARTHROPLASTY;  Surgeon: Hiram Gash, MD;  [facility];  Service: Orthopedics;  Laterality: Left;  Marland Kitchen VAGINAL HYSTERECTOMY      There were no vitals filed for this visit.      [facility] OT Assessment - 07/05/19 0931      Assessment   Medical Diagnosis  s/p left reverse TSA      Precautions   Precautions  Shoulder    Type of Shoulder Precautions  See protocol                OT Treatments/Exercises (OP) - 07/05/19 0830      Exercises   Exercises  Shoulder      Shoulder Exercises: Supine   Protraction  PROM;5 reps  Horizontal ABduction  PROM;5 reps;Strengthening;15 reps    Horizontal ABduction Weight (lbs)  1    External Rotation  PROM;5 reps    Internal Rotation  PROM;5 reps    Flexion  PROM;5 reps;Strengthening;15 reps    Shoulder Flexion Weight (lbs)  1    ABduction  PROM;5 reps      Shoulder Exercises: Standing   Protraction  Strengthening;12 reps    Protraction Weight (lbs)  1    Horizontal ABduction  AROM;15 reps    External Rotation  Strengthening;10 reps    External Rotation Weight (lbs)  1    Internal Rotation  Strengthening;15 reps    Internal Rotation Weight (lbs)  1    Flexion  Strengthening;15 reps    Shoulder Flexion Weight (lbs)  1    ABduction  AROM;12 reps    Extension  Theraband;12 reps    Theraband Level (Shoulder Extension)  Level 2 (Red)    Row   Theraband;12 reps    Theraband Level (Shoulder Row)  Level 2 (Red)    Retraction  Theraband;12 reps    Theraband Level (Shoulder Retraction)  Level 2 (Red)      Shoulder Exercises: Therapy Ball   Other Therapy Ball Exercises  green therapy ball; 10X; chest press, flexion, circles both directions      Shoulder Exercises: ROM/Strengthening   Over Head Lace  2' seated    X to V Arms  12X    Proximal Shoulder Strengthening, Seated  10X each; 1#; no rest breaks    Ball on Wall  1' flexion 1' abduction green ball      Manual Therapy   Manual Therapy  Myofascial release    Manual therapy comments  completed separately from therapeutic exercises    Myofascial Release  myofascial release to left upper arm, anterior shoulder, trapezius, and scapularis regions to decrease fascial restrictions and increase joint ROM.                OT Short Term Goals - 06/13/19 1156      OT SHORT TERM GOAL #1   Title  Pt will be provided with and educated on HEP to improve mobility in LUE required for ADL completion.    Time  4    Period  Weeks    Status  On-going    Target Date  06/14/19      OT SHORT TERM GOAL #2   Title  Pt will increase LUE P/ROM to [facility] to improve ability to use LUE as assist with dressing tasks.    Time  4    Period  Weeks      OT SHORT TERM GOAL #3   Title  Pt will increase LUE strength to 4-/5 to improve ability to use LUE to reach items above shoulder height.    Time  4    Period  Weeks    Status  On-going        OT Long Term Goals - 05/24/19 1458      OT LONG TERM GOAL #1   Title  Pt will return to highest level of functioning using LUE as non-dominant during ADL completion.    Time  8    Period  Weeks    Status  On-going      OT LONG TERM GOAL #2   Title  Pt will decrease LUE pain to 3/10 or less to improve ability to sleep for 4 consecutive hours or more at night.  Time  8    Period  Weeks    Status  On-going      OT LONG TERM GOAL #3   Title  Pt  will decrease LUE fascial restrictions to min amounts or less to improve mobility required for functional reaching tasks.    Time  8    Period  Weeks    Status  On-going      OT LONG TERM GOAL #4   Title  Pt will increase A/ROM in LUE to [facility] to improve ability to reach into overhead cabinets and behind back.    Time  2    Period  Weeks    Status  On-going      OT LONG TERM GOAL #5   Title  Pt will increase LUE strength to 4+/5 or greater to improve ability to fish with LUE.    Time  8    Period  Weeks    Status  On-going            Plan - 07/05/19 [id number]    Clinical Impression Statement  A: Continued with lightweight strengthening as patient was able to demonstrate with proper form. Resumed scapular theraband exercises while increasing repetitions to strength scapular strength and stability. VC for form and technique were provided. Manual techniques were completed to address fascial restrictions which were only present in the anterior shoulder region/anterior deltoid.    Body Structure / Function / Physical Skills  ADL;Endurance;UE functional use;Fascial restriction;Pain;ROM;IADL;Strength    Plan  P: Continue to progress strengthening as able. Review use of tennis ball for self myofascial release.    Consulted and Agree with Plan of Care  Patient       Patient will benefit from skilled therapeutic intervention in order to improve the following deficits and impairments:   Body Structure / Function / Physical Skills: ADL, Endurance, UE functional use, Fascial restriction, Pain, ROM, IADL, Strength       Visit Diagnosis: Acute pain of left shoulder  Stiffness of left shoulder, not elsewhere classified  Other symptoms and signs involving the musculoskeletal system    Problem List Patient Active Problem List   Diagnosis Date Noted  . Hoarseness   . Constipation   . Other dysphagia   . 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  . Insomnia 03/15/2019  .  Abdominal pain, epigastric 05/09/2016  . Belching 05/09/2016  . Hyperlipidemia 06/18/2014  . Hyperglycemia 06/18/2014  . Gout 06/18/2014  . Osteopenia 10/28/2013  . Right foot pain 01/31/2013  . Hypertension 03/29/2011  . High cholesterol 03/29/2011   Ailene Ravel, OTR/L,CBIS  [phone]  07/05/2019, 9:31 AM  [place] [address] [place], [place], [address] Phone: [phone]   Fax:  [phone]  Name: Terri Douglas MRN: [id number] Date of Birth: 1946/02/26

## 2019-07-09 ENCOUNTER — Other Ambulatory Visit: Payer: Self-pay

## 2019-07-09 ENCOUNTER — Ambulatory Visit (HOSPITAL_COMMUNITY): Payer: Medicare Other | Attending: Physician Assistant

## 2019-07-09 ENCOUNTER — Encounter (HOSPITAL_COMMUNITY): Payer: Self-pay

## 2019-07-09 DIAGNOSIS — M25512 Pain in left shoulder: Secondary | ICD-10-CM | POA: Insufficient documentation

## 2019-07-09 DIAGNOSIS — R29898 Other symptoms and signs involving the musculoskeletal system: Secondary | ICD-10-CM | POA: Diagnosis not present

## 2019-07-09 DIAGNOSIS — M25612 Stiffness of left shoulder, not elsewhere classified: Secondary | ICD-10-CM | POA: Insufficient documentation

## 2019-07-09 NOTE — Therapy (Signed)
[place], [place], [address] Phone: [phone]   Fax:  [phone]  Occupational Therapy Treatment  Patient Details  Name: Terri Douglas MRN: [id number] Date of Birth: 05/29/1946 Referring Provider (OT): [name], [name] (Surgeon-[name])   Encounter Date: 07/09/2019  OT End of Session - 07/09/19 1011    Visit Number  15    Number of Visits  16    Date for OT Re-Evaluation  07/14/19    Authorization Type  1) Medicare A & B; BCBS Supplement    Authorization Time Period  progress note by 18th visit    Authorization - Visit Number  15    Authorization - Number of Visits  18    OT Start Time  [phone]    OT Stop Time  1025    OT Time Calculation (min)  42 min    Activity Tolerance  Patient tolerated treatment well    Behavior During Therapy  [facility] for tasks assessed/performed       Past Medical History:  Diagnosis Date  . Arthritis   . Complication of anesthesia   . Gall stones   . GERD (gastroesophageal reflux disease)   . History of cardiac catheterization 2005   Minor elevation in cardiac enzymes however no significant obstructive CAD  . Hyperlipidemia   . Hypertension   . Insomnia   . PONV (postoperative nausea and vomiting)   . Prediabetes     Past Surgical History:  Procedure Laterality Date  . A-FLUTTER ABLATION N/A 08/01/2017   Procedure: A-FLUTTER ABLATION;  Surgeon: Thompson Grayer, MD;  Location: [place] [facility];  Service: Cardiovascular;  Laterality: N/A;  . ABDOMINAL HYSTERECTOMY    . BACK SURGERY  1992  . CARDIAC CATHETERIZATION     BACK IN 2004  SHE THINKS IT CAME BACK 'NORMAL'  . CHOLECYSTECTOMY N/A 06/24/2015   Procedure: LAPAROSCOPIC CHOLECYSTECTOMY;  Surgeon: Mickeal Skinner, MD;  Location: [place];  Service: General;  Laterality: N/A;  . COLONOSCOPY N/A 08/30/2012   Procedure: COLONOSCOPY;  Surgeon: Rogene Houston, MD;  [facility];  Service: Endoscopy;   Laterality: N/A;  830  . DILATION AND CURETTAGE OF UTERUS    . ESOPHAGOGASTRODUODENOSCOPY  04/15/2011   Procedure: ESOPHAGOGASTRODUODENOSCOPY (EGD);  Surgeon: Rogene Houston, MD;  [facility];  Service: Endoscopy;  Laterality: N/A;  11:30  . ESOPHAGOGASTRODUODENOSCOPY N/A 05/27/2016   Procedure: ESOPHAGOGASTRODUODENOSCOPY (EGD);  Surgeon: Rogene Houston, MD;  [facility];  Service: Endoscopy;  Laterality: N/A;  11:15  . EYE SURGERY     cataract removal  . NECK SURGERY  12/13/2018  . REVERSE SHOULDER ARTHROPLASTY Left 04/03/2019   Procedure: REVERSE SHOULDER ARTHROPLASTY;  Surgeon: Hiram Gash, MD;  [facility];  Service: Orthopedics;  Laterality: Left;  Marland Kitchen VAGINAL HYSTERECTOMY      There were no vitals filed for this visit.  Subjective Assessment - 07/09/19 1004    Subjective   S: I took a shower and did my hair so my bicep is a little sore.    Currently in Pain?  Yes    Pain Score  2     Pain Location  Arm   bicep   Pain Orientation  Left    Pain Descriptors / Indicators  Sore    Pain Type  Acute pain    Pain Radiating Towards  N/A    Pain Onset  Today  Pain Frequency  Constant    Aggravating Factors   taking a shower and washing hair.    Pain Relieving Factors  N/A    Effect of Pain on Daily Activities  min effect    Multiple Pain Sites  No         [facility] OT Assessment - 07/09/19 1005      Assessment   Medical Diagnosis  s/p left reverse TSA      Precautions   Precautions  Shoulder    Type of Shoulder Precautions  See protocol                OT Treatments/Exercises (OP) - 07/09/19 1006      Exercises   Exercises  Shoulder      Shoulder Exercises: Supine   Protraction  PROM;5 reps    Horizontal ABduction  PROM;5 reps    External Rotation  PROM;5 reps    Internal Rotation  PROM;5 reps    Flexion  PROM;5 reps    ABduction  PROM;5 reps      Shoulder Exercises: Standing   Protraction  Strengthening;12 reps     Protraction Weight (lbs)  1    Horizontal ABduction  Strengthening;10 reps    Horizontal ABduction Weight (lbs)  1    External Rotation  Strengthening;15 reps    External Rotation Weight (lbs)  1    Internal Rotation  Strengthening;15 reps    Internal Rotation Weight (lbs)  1    Flexion  Strengthening;15 reps    Shoulder Flexion Weight (lbs)  1    ABduction  AROM;15 reps      Shoulder Exercises: Therapy Ball   Other Therapy Ball Exercises  green therapy ball; 10X; chest press, flexion, circles both directions      Shoulder Exercises: ROM/Strengthening   UBE (Upper Arm Bike)  Level 2 3' reverse   pace: 7.0-8.0   Over Head Lace  2' seated    X to V Arms  15X    Proximal Shoulder Strengthening, Seated  10X each; 1#; no rest breaks    Ball on Wall  1' flexion 1' abduction green ball      Manual Therapy   Manual Therapy  Myofascial release    Manual therapy comments  completed separately from therapeutic exercises    Myofascial Release  myofascial release to left upper arm, anterior shoulder, trapezius, and scapularis regions to decrease fascial restrictions and increase joint ROM.                OT Short Term Goals - 06/13/19 1156      OT SHORT TERM GOAL #1   Title  Pt will be provided with and educated on HEP to improve mobility in LUE required for ADL completion.    Time  4    Period  Weeks    Status  On-going    Target Date  06/14/19      OT SHORT TERM GOAL #2   Title  Pt will increase LUE P/ROM to [facility] to improve ability to use LUE as assist with dressing tasks.    Time  4    Period  Weeks      OT SHORT TERM GOAL #3   Title  Pt will increase LUE strength to 4-/5 to improve ability to use LUE to reach items above shoulder height.    Time  4    Period  Weeks    Status  On-going  OT Long Term Goals - 05/24/19 1458      OT LONG TERM GOAL #1   Title  Pt will return to highest level of functioning using LUE as non-dominant during ADL completion.    Time   8    Period  Weeks    Status  On-going      OT LONG TERM GOAL #2   Title  Pt will decrease LUE pain to 3/10 or less to improve ability to sleep for 4 consecutive hours or more at night.    Time  8    Period  Weeks    Status  On-going      OT LONG TERM GOAL #3   Title  Pt will decrease LUE fascial restrictions to min amounts or less to improve mobility required for functional reaching tasks.    Time  8    Period  Weeks    Status  On-going      OT LONG TERM GOAL #4   Title  Pt will increase A/ROM in LUE to [facility] to improve ability to reach into overhead cabinets and behind back.    Time  2    Period  Weeks    Status  On-going      OT LONG TERM GOAL #5   Title  Pt will increase LUE strength to 4+/5 or greater to improve ability to fish with LUE.    Time  8    Period  Weeks    Status  On-going            Plan - 07/09/19 1011    Clinical Impression Statement  A: Continued to focus on strengthening during session using lightweights and increasing repetitions as able. Patient was able to complete horizontal abduction using the 1# weight. VC for form and technique. Manual techniques completed to address fascial restrictions.    Body Structure / Function / Physical Skills  ADL;Endurance;UE functional use;Fascial restriction;Pain;ROM;IADL;Strength    Plan  P: reassessment, FOTO, review goals. determine if she would like to extend therapy to continue working on strength.    Consulted and Agree with Plan of Care  Patient       Patient will benefit from skilled therapeutic intervention in order to improve the following deficits and impairments:   Body Structure / Function / Physical Skills: ADL, Endurance, UE functional use, Fascial restriction, Pain, ROM, IADL, Strength       Visit Diagnosis: Stiffness of left shoulder, not elsewhere classified  Other symptoms and signs involving the musculoskeletal system  Acute pain of left shoulder    Problem List Patient Active  Problem List   Diagnosis Date Noted  . Hoarseness   . Constipation   . Other dysphagia   . 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  . Insomnia 03/15/2019  . Abdominal pain, epigastric 05/09/2016  . Belching 05/09/2016  . Hyperlipidemia 06/18/2014  . Hyperglycemia 06/18/2014  . Gout 06/18/2014  . Osteopenia 10/28/2013  . Right foot pain 01/31/2013  . Hypertension 03/29/2011  . High cholesterol 03/29/2011   Ailene Ravel, OTR/L,CBIS  [phone]  07/09/2019, 11:15 AM  [place] [address] [place], [place], [address] Phone: [phone]   Fax:  [phone]  Name: Terri Douglas MRN: [id number] Date of Birth: 09-12-1945

## 2019-07-11 ENCOUNTER — Ambulatory Visit (HOSPITAL_COMMUNITY): Payer: Medicare Other

## 2019-07-11 ENCOUNTER — Other Ambulatory Visit: Payer: Self-pay

## 2019-07-11 DIAGNOSIS — M25612 Stiffness of left shoulder, not elsewhere classified: Secondary | ICD-10-CM

## 2019-07-11 DIAGNOSIS — R29898 Other symptoms and signs involving the musculoskeletal system: Secondary | ICD-10-CM

## 2019-07-11 DIAGNOSIS — M25512 Pain in left shoulder: Secondary | ICD-10-CM

## 2019-07-11 NOTE — Patient Instructions (Signed)
Complete the following exercises 2-3 times a day.  Doorway Stretch  Place each hand opposite each other on the doorway. (You can change where you feel the stretch by moving arms higher or lower.) Step through with one foot and bend front knee until a stretch is felt and hold. Step through with the opposite foot on the next rep. Hold for __20___ seconds. Repeat __2__times.        Internal Rotation Across Back  Grab the end of a towel with your affected side, palm facing backwards. Grab the towel with your unaffected side and pull your affected hand across your back until you feel a stretch in the front of your shoulder. If you feel pain, pull just to the pain, do not pull through the pain. Hold. Return your affected arm to your side. Try to keep your hand/arm close to your body during the entire movement.     Hold for 20 seconds. Complete 2 times.          Wall Flexion  Slide your arm up the wall or door frame until a stretch is felt in your shoulder . Hold for 20. Complete 2 times     Shoulder Abduction Stretch  Stand side ways by a wall with affected up on wall. Gently step in toward wall to feel stretch. Hold for 20 seconds. Complete 2 times.

## 2019-07-11 NOTE — Therapy (Signed)
[place], [place], [address] Phone: [phone]   Fax:  [phone]  Occupational Therapy Treatment Reassessment/re-cert Patient Details  Name: Terri Douglas MRN: [id number] Date of Birth: 1945/09/27 Referring Provider (OT): [name], [name] (Surgeon-[name])   Progress Note Reporting Period 06/11/2019 to 07/11/2019  See note below for Objective Data and Assessment of Progress/Goals.       Encounter Date: 07/11/2019  OT End of Session - 07/11/19 0957    Visit Number  16    Number of Visits  24    Date for OT Re-Evaluation  08/08/19    Authorization Type  1) Medicare A & B; BCBS Supplement    Authorization Time Period  progress note by 26th visit    Authorization - Visit Number  16    Authorization - Number of Visits  26    OT Start Time  0900    OT Stop Time  0945    OT Time Calculation (min)  45 min    Activity Tolerance  Patient tolerated treatment well    Behavior During Therapy  WFL for tasks assessed/performed       Past Medical History:  Diagnosis Date  . Arthritis   . Complication of anesthesia   . Gall stones   . GERD (gastroesophageal reflux disease)   . History of cardiac catheterization 2005   Minor elevation in cardiac enzymes however no significant obstructive CAD  . Hyperlipidemia   . Hypertension   . Insomnia   . PONV (postoperative nausea and vomiting)   . Prediabetes     Past Surgical History:  Procedure Laterality Date  . A-FLUTTER ABLATION N/A 08/01/2017   Procedure: A-FLUTTER ABLATION;  Surgeon: Thompson Grayer, MD;  Location: [facility];  Service: Cardiovascular;  Laterality: N/A;  . ABDOMINAL HYSTERECTOMY    . BACK SURGERY  1992  . CARDIAC CATHETERIZATION     BACK IN 2004  SHE THINKS IT CAME BACK 'NORMAL'  . CHOLECYSTECTOMY N/A 06/24/2015   Procedure: LAPAROSCOPIC CHOLECYSTECTOMY;  Surgeon: Mickeal Skinner, MD;  Location: [place];  Service: General;   Laterality: N/A;  . COLONOSCOPY N/A 08/30/2012   Procedure: COLONOSCOPY;  Surgeon: Rogene Houston, MD;  [facility];  Service: Endoscopy;  Laterality: N/A;  830  . DILATION AND CURETTAGE OF UTERUS    . ESOPHAGOGASTRODUODENOSCOPY  04/15/2011   Procedure: ESOPHAGOGASTRODUODENOSCOPY (EGD);  Surgeon: Rogene Houston, MD;  [facility];  Service: Endoscopy;  Laterality: N/A;  11:30  . ESOPHAGOGASTRODUODENOSCOPY N/A 05/27/2016   Procedure: ESOPHAGOGASTRODUODENOSCOPY (EGD);  Surgeon: Rogene Houston, MD;  [facility];  Service: Endoscopy;  Laterality: N/A;  11:15  . EYE SURGERY     cataract removal  . NECK SURGERY  12/13/2018  . REVERSE SHOULDER ARTHROPLASTY Left 04/03/2019   Procedure: REVERSE SHOULDER ARTHROPLASTY;  Surgeon: Hiram Gash, MD;  [facility];  Service: Orthopedics;  Laterality: Left;  Marland Kitchen VAGINAL HYSTERECTOMY      There were no vitals filed for this visit.  Subjective Assessment - 07/11/19 0955    Subjective   S: I feel like I still need to work on my strength.    Currently in Pain?  No/denies         [facility] OT Assessment - 07/11/19 0906      Assessment   Medical Diagnosis  s/p left reverse TSA    Onset Date/Surgical Date  04/03/19  Precautions   Precautions  Shoulder    Type of Shoulder Precautions  See protocol       Prior Function   Level of Independence  Independent      Observation/Other Assessments   Focus on Therapeutic Outcomes (FOTO)   59/100      ROM / Strength   AROM / PROM / Strength  AROM;PROM;Strength      AROM   Overall AROM Comments  Assessed seated, er/IR adducted    AROM Assessment Site  Shoulder    Right/Left Shoulder  Left    Left Shoulder Flexion  140 Degrees   previous: 122   Left Shoulder ABduction  120 Degrees   previous: 107   Left Shoulder Internal Rotation  90 Degrees   previous: same   Left Shoulder External Rotation  55 Degrees   previous: 40     PROM   Overall PROM Comments   Assessed supine, er/IR adducted    PROM Assessment Site  Shoulder    Right/Left Shoulder  Left    Left Shoulder Flexion  158 Degrees   previous: same   Left Shoulder ABduction  180 Degrees   previous: 160   Left Shoulder Internal Rotation  90 Degrees   previous: same   Left Shoulder External Rotation  70 Degrees   previuos: 55     Strength   Overall Strength Comments  Assessed seated, er/IR adducted    Strength Assessment Site  Shoulder    Right/Left Shoulder  Left    Left Shoulder Flexion  3+/5   previous: 3/5   Left Shoulder ABduction  3+/5   previous: 3/5   Left Shoulder Internal Rotation  4/5   previous: 4-/5   Left Shoulder External Rotation  3/5   previous: same              OT Treatments/Exercises (OP) - 07/11/19 0954      Exercises   Exercises  Shoulder      Shoulder Exercises: Supine   Protraction  PROM;5 reps    Horizontal ABduction  PROM;5 reps    External Rotation  PROM;5 reps    Internal Rotation  PROM;5 reps    Flexion  PROM;5 reps    ABduction  PROM;5 reps      Shoulder Exercises: Stretch   Internal Rotation Stretch  2 reps   20' towel horizontal   Wall Stretch - Flexion  2 reps;20 seconds    Wall Stretch - ABduction  2 reps;20 seconds    Other Shoulder Stretches  Doorway stretch; 20' 2 sets      Manual Therapy   Manual Therapy  Myofascial release    Manual therapy comments  completed separately from therapeutic exercises    Myofascial Release  myofascial release to left upper arm, anterior shoulder, trapezius, and scapularis regions to decrease fascial restrictions and increase joint ROM.              OT Education - 07/11/19 [phone]    Education Details  reviewed progress in therapy. completed FOTO. reviewed goals. Updated HEP to include: shoulder stretches. patient is to continue with scapular theraband strengthening and with A/ROM exercises while holding a water bottle as resistance if able to display proper form and technique.     Person(s) Educated  Patient    Methods  Explanation;Demonstration;Verbal cues;Handout    Comprehension  Returned demonstration;Verbalized understanding       OT Short Term Goals - 07/11/19 5035  OT SHORT TERM GOAL #1   Title  Pt will be provided with and educated on HEP to improve mobility in LUE required for ADL completion.    Time  4    Period  Weeks    Status  Achieved    Target Date  06/14/19      OT SHORT TERM GOAL #2   Title  Pt will increase LUE P/ROM to [facility] to improve ability to use LUE as assist with dressing tasks.    Time  4    Period  Weeks      OT SHORT TERM GOAL #3   Title  Pt will increase LUE strength to 4-/5 to improve ability to use LUE to reach items above shoulder height.    Time  4    Period  Weeks    Status  Partially Met        OT Long Term Goals - 07/11/19 0926      OT LONG TERM GOAL #1   Title  Pt will return to highest level of functioning using LUE as non-dominant during ADL completion.    Time  8    Period  Weeks    Status  On-going      OT LONG TERM GOAL #2   Title  Pt will decrease LUE pain to 3/10 or less to improve ability to sleep for 4 consecutive hours or more at night.    Time  8    Period  Weeks    Status  Achieved      OT LONG TERM GOAL #3   Title  Pt will decrease LUE fascial restrictions to min amounts or less to improve mobility required for functional reaching tasks.    Time  8    Period  Weeks    Status  Achieved      OT LONG TERM GOAL #4   Title  Pt will increase A/ROM in LUE to [facility] to improve ability to reach into overhead cabinets and behind back.    Time  2    Period  Weeks    Status  On-going      OT LONG TERM GOAL #5   Title  Pt will increase LUE strength to 4+/5 or greater to improve ability to fish with LUE.    Time  8    Period  Weeks    Status  On-going            Plan - 07/11/19 1001    Clinical Impression Statement  A: Reassessment completed this date. Patient has met all short term goals  except her strength goal which she has partially met. She has met 2/5 LTGs. Improvements have been made with strength, ROM, pain at night, and fascial restrictions. Pt continues to present with deficits related to strength and ROM and will benefit from 4 more weeks of OT services to focus on these deficits. HEP was updated this session to include shoulder stretches. Pt is in agreement with extending therapy.    Body Structure / Function / Physical Skills  ADL;Endurance;UE functional use;Fascial restriction;Pain;ROM;IADL;Strength    Plan  P: Continue OT services 2X a week for 4 more weeks. Follow up on shoulder stretches. Continues to work on strength at or above shoulder level. Scapular stability.    Consulted and Agree with Plan of Care  Patient       Patient will benefit from skilled therapeutic intervention in order to improve the following deficits and impairments:  Body Structure / Function / Physical Skills: ADL, Endurance, UE functional use, Fascial restriction, Pain, ROM, IADL, Strength       Visit Diagnosis: Stiffness of left shoulder, not elsewhere classified - Plan: Ot plan of care cert/re-cert  Other symptoms and signs involving the musculoskeletal system - Plan: Ot plan of care cert/re-cert  Acute pain of left shoulder - Plan: Ot plan of care cert/re-cert    Problem List Patient Active Problem List   Diagnosis Date Noted  . Hoarseness   . Constipation   . Other dysphagia   . 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  . Insomnia 03/15/2019  . Abdominal pain, epigastric 05/09/2016  . Belching 05/09/2016  . Hyperlipidemia 06/18/2014  . Hyperglycemia 06/18/2014  . Gout 06/18/2014  . Osteopenia 10/28/2013  . Right foot pain 01/31/2013  . Hypertension 03/29/2011  . High cholesterol 03/29/2011   Ailene Ravel, OTR/L,CBIS  [phone]   07/11/2019, 10:09 AM  [place],  [place], [address] Phone: [phone]   Fax:  [phone]  Name: JENY NIELD MRN: [id number] Date of Birth: 06/29/1945

## 2019-07-18 ENCOUNTER — Encounter (HOSPITAL_COMMUNITY): Payer: Self-pay

## 2019-07-18 ENCOUNTER — Other Ambulatory Visit: Payer: Self-pay

## 2019-07-18 ENCOUNTER — Ambulatory Visit (HOSPITAL_COMMUNITY): Payer: Medicare Other

## 2019-07-18 DIAGNOSIS — M25512 Pain in left shoulder: Secondary | ICD-10-CM | POA: Diagnosis not present

## 2019-07-18 DIAGNOSIS — M25612 Stiffness of left shoulder, not elsewhere classified: Secondary | ICD-10-CM

## 2019-07-18 DIAGNOSIS — R29898 Other symptoms and signs involving the musculoskeletal system: Secondary | ICD-10-CM | POA: Diagnosis not present

## 2019-07-18 NOTE — Therapy (Signed)
[place], [place], [address] Phone: [phone]   Fax:  [phone]  Occupational Therapy Treatment  Patient Details  Name: Terri Douglas MRN: [id number] Date of Birth: 08/15/1945 Referring Provider (OT): [name], [name] (Surgeon-[name])   Encounter Date: 07/18/2019  OT End of Session - 07/18/19 9373    Visit Number  17    Number of Visits  24    Date for OT Re-Evaluation  08/08/19    Authorization Type  1) Medicare A & B; BCBS Supplement    Authorization Time Period  progress note by 26th visit    Authorization - Visit Number  17    Authorization - Number of Visits  26    OT Start Time  [phone]   pt arrived late   OT Stop Time  1023    OT Time Calculation (min)  35 min    Activity Tolerance  Patient tolerated treatment well    Behavior During Therapy  [facility] for tasks assessed/performed       Past Medical History:  Diagnosis Date  . Arthritis   . Complication of anesthesia   . Gall stones   . GERD (gastroesophageal reflux disease)   . History of cardiac catheterization 2005   Minor elevation in cardiac enzymes however no significant obstructive CAD  . Hyperlipidemia   . Hypertension   . Insomnia   . PONV (postoperative nausea and vomiting)   . Prediabetes     Past Surgical History:  Procedure Laterality Date  . A-FLUTTER ABLATION N/A 08/01/2017   Procedure: A-FLUTTER ABLATION;  Surgeon: Thompson Grayer, MD;  Location: [facility];  Service: Cardiovascular;  Laterality: N/A;  . ABDOMINAL HYSTERECTOMY    . BACK SURGERY  1992  . CARDIAC CATHETERIZATION     BACK IN 2004  SHE THINKS IT CAME BACK 'NORMAL'  . CHOLECYSTECTOMY N/A 06/24/2015   Procedure: LAPAROSCOPIC CHOLECYSTECTOMY;  Surgeon: Mickeal Skinner, MD;  Location: [place];  Service: General;  Laterality: N/A;  . COLONOSCOPY N/A 08/30/2012   Procedure: COLONOSCOPY;  Surgeon: Rogene Houston, MD;  [facility];   Service: Endoscopy;  Laterality: N/A;  830  . DILATION AND CURETTAGE OF UTERUS    . ESOPHAGOGASTRODUODENOSCOPY  04/15/2011   Procedure: ESOPHAGOGASTRODUODENOSCOPY (EGD);  Surgeon: Rogene Houston, MD;  [facility];  Service: Endoscopy;  Laterality: N/A;  11:30  . ESOPHAGOGASTRODUODENOSCOPY N/A 05/27/2016   Procedure: ESOPHAGOGASTRODUODENOSCOPY (EGD);  Surgeon: Rogene Houston, MD;  [facility];  Service: Endoscopy;  Laterality: N/A;  11:15  . EYE SURGERY     cataract removal  . NECK SURGERY  12/13/2018  . REVERSE SHOULDER ARTHROPLASTY Left 04/03/2019   Procedure: REVERSE SHOULDER ARTHROPLASTY;  Surgeon: Hiram Gash, MD;  [facility];  Service: Orthopedics;  Laterality: Left;  Marland Kitchen VAGINAL HYSTERECTOMY      There were no vitals filed for this visit.  Subjective Assessment - 07/18/19 0953    Subjective   S: It just feels sore.    Currently in Pain?  Yes    Pain Score  3     Pain Location  Shoulder   upper arm/deltoid region   Pain Orientation  Left    Pain Descriptors / Indicators  Sore    Pain Type  Acute pain    Pain Radiating Towards  N/A    Pain Onset  In the past 7 days    Pain  Frequency  Occasional    Aggravating Factors   Exercises    Pain Relieving Factors  N/A    Effect of Pain on Daily Activities  no effect    Multiple Pain Sites  No         [facility] OT Assessment - 07/18/19 1249      Assessment   Medical Diagnosis  s/p left reverse TSA      Precautions   Precautions  Shoulder    Type of Shoulder Precautions  See protocol                OT Treatments/Exercises (OP) - 07/18/19 0001      Exercises   Exercises  Shoulder      Shoulder Exercises: Standing   Horizontal ABduction  Strengthening;10 reps   unilateral   Horizontal ABduction Weight (lbs)  1    External Rotation  Strengthening;15 reps   unilateral   External Rotation Weight (lbs)  1    Internal Rotation  Strengthening;15 reps   unilateral   Internal Rotation  Weight (lbs)  1    Flexion  Strengthening;15 reps   unilateral; top half of range only   Shoulder Flexion Weight (lbs)  1    ABduction  AROM;15 reps   unilateral; top half of movement     Shoulder Exercises: ROM/Strengthening   Over Head Lace  2' seated with 1/2lb wrist weight    "W" Arms  15X    X to V Arms  15X    Ball on Wall  1' flexion 1' abduction green ball    Other ROM/Strengthening Exercises  Y arms lift off; 10X      Manual Therapy   Manual Therapy  Myofascial release    Manual therapy comments  completed separately from therapeutic exercises    Myofascial Release  myofascial release to left upper arm, anterior shoulder, trapezius, and scapularis regions to decrease fascial restrictions and increase joint ROM.                OT Short Term Goals - 07/18/19 1247      OT SHORT TERM GOAL #1   Title  Pt will be provided with and educated on HEP to improve mobility in LUE required for ADL completion.    Time  4    Period  Weeks    Target Date  06/14/19      OT SHORT TERM GOAL #2   Title  Pt will increase LUE P/ROM to [facility] to improve ability to use LUE as assist with dressing tasks.    Time  4    Period  Weeks      OT SHORT TERM GOAL #3   Title  Pt will increase LUE strength to 4-/5 to improve ability to use LUE to reach items above shoulder height.    Time  4    Period  Weeks    Status  Partially Met        OT Long Term Goals - 07/18/19 1247      OT LONG TERM GOAL #1   Title  Pt will return to highest level of functioning using LUE as non-dominant during ADL completion.    Time  8    Period  Weeks    Status  On-going      OT LONG TERM GOAL #2   Title  Pt will decrease LUE pain to 3/10 or less to improve ability to sleep for 4 consecutive hours or more at  night.    Time  8    Period  Weeks      OT LONG TERM GOAL #3   Title  Pt will decrease LUE fascial restrictions to min amounts or less to improve mobility required for functional reaching tasks.     Time  8    Period  Weeks      OT LONG TERM GOAL #4   Title  Pt will increase A/ROM in LUE to [facility] to improve ability to reach into overhead cabinets and behind back.    Time  2    Period  Weeks    Status  On-going      OT LONG TERM GOAL #5   Title  Pt will increase LUE strength to 4+/5 or greater to improve ability to fish with LUE.    Time  8    Period  Weeks    Status  On-going            Plan - 07/18/19 1242    Clinical Impression Statement  A: Manual techniques completed to address fascial restrictions in the left medial deltoid of the left UE. VC for form and technique were provided. Focused on shoulder strength and scapular stability during session with use of handweights as able. Unilateral strengthening movements were used to increase demand on stability.    Body Structure / Function / Physical Skills  ADL;Endurance;UE functional use;Fascial restriction;Pain;ROM;IADL;Strength    Plan  P: Continue to work on strengthening at or above shoulder level. Overhead lacing with 1# wrist weight. Add therapy ball strengthening.    Consulted and Agree with Plan of Care  Patient       Patient will benefit from skilled therapeutic intervention in order to improve the following deficits and impairments:   Body Structure / Function / Physical Skills: ADL, Endurance, UE functional use, Fascial restriction, Pain, ROM, IADL, Strength       Visit Diagnosis: Stiffness of left shoulder, not elsewhere classified  Other symptoms and signs involving the musculoskeletal system  Acute pain of left shoulder    Problem List Patient Active Problem List   Diagnosis Date Noted  . Hoarseness   . Constipation   . Other dysphagia   . 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  . Insomnia 03/15/2019  . Abdominal pain, epigastric 05/09/2016  . Belching 05/09/2016  . Hyperlipidemia 06/18/2014  . Hyperglycemia 06/18/2014  . Gout 06/18/2014  . Osteopenia 10/28/2013  . Right  foot pain 01/31/2013  . Hypertension 03/29/2011  . High cholesterol 03/29/2011   Ailene Ravel, OTR/L,CBIS  [phone]   07/18/2019, 12:49 PM  [place] [address] [place], [place], [address] Phone: [phone]   Fax:  [phone]  Name: DONNAH LEVERT MRN: [id number] Date of Birth: April 17, 1946

## 2019-07-19 ENCOUNTER — Ambulatory Visit (HOSPITAL_COMMUNITY): Payer: Medicare Other

## 2019-07-19 ENCOUNTER — Encounter (HOSPITAL_COMMUNITY): Payer: Self-pay

## 2019-07-19 DIAGNOSIS — Z23 Encounter for immunization: Secondary | ICD-10-CM | POA: Diagnosis not present

## 2019-07-19 DIAGNOSIS — M25512 Pain in left shoulder: Secondary | ICD-10-CM | POA: Diagnosis not present

## 2019-07-19 DIAGNOSIS — M25612 Stiffness of left shoulder, not elsewhere classified: Secondary | ICD-10-CM

## 2019-07-19 DIAGNOSIS — R29898 Other symptoms and signs involving the musculoskeletal system: Secondary | ICD-10-CM | POA: Diagnosis not present

## 2019-07-19 NOTE — Therapy (Signed)
[place], [place], [address] Phone: [phone]   Fax:  [phone]  Occupational Therapy Treatment  Patient Details  Name: Terri Douglas MRN: [id number] Date of Birth: 1945/09/13 Referring Provider (OT): [name], [name] (Surgeon-[name])   Encounter Date: 07/19/2019  OT End of Session - 07/19/19 0907    Visit Number  18    Number of Visits  24    Date for OT Re-Evaluation  08/08/19    Authorization Type  1) Medicare A & B; BCBS Supplement    Authorization Time Period  progress note by 26th visit    Authorization - Visit Number  18    Authorization - Number of Visits  26    OT Start Time  0815    OT Stop Time  0853    OT Time Calculation (min)  38 min    Activity Tolerance  Patient tolerated treatment well    Behavior During Therapy  [facility] for tasks assessed/performed       Past Medical History:  Diagnosis Date  . Arthritis   . Complication of anesthesia   . Gall stones   . GERD (gastroesophageal reflux disease)   . History of cardiac catheterization 2005   Minor elevation in cardiac enzymes however no significant obstructive CAD  . Hyperlipidemia   . Hypertension   . Insomnia   . PONV (postoperative nausea and vomiting)   . Prediabetes     Past Surgical History:  Procedure Laterality Date  . A-FLUTTER ABLATION N/A 08/01/2017   Procedure: A-FLUTTER ABLATION;  Surgeon: Thompson Grayer, MD;  Location: [place] [facility];  Service: Cardiovascular;  Laterality: N/A;  . ABDOMINAL HYSTERECTOMY    . BACK SURGERY  1992  . CARDIAC CATHETERIZATION     BACK IN 2004  SHE THINKS IT CAME BACK 'NORMAL'  . CHOLECYSTECTOMY N/A 06/24/2015   Procedure: LAPAROSCOPIC CHOLECYSTECTOMY;  Surgeon: Mickeal Skinner, MD;  Location: [place];  Service: General;  Laterality: N/A;  . COLONOSCOPY N/A 08/30/2012   Procedure: COLONOSCOPY;  Surgeon: Rogene Houston, MD;  [facility];  Service: Endoscopy;   Laterality: N/A;  830  . DILATION AND CURETTAGE OF UTERUS    . ESOPHAGOGASTRODUODENOSCOPY  04/15/2011   Procedure: ESOPHAGOGASTRODUODENOSCOPY (EGD);  Surgeon: Rogene Houston, MD;  [facility];  Service: Endoscopy;  Laterality: N/A;  11:30  . ESOPHAGOGASTRODUODENOSCOPY N/A 05/27/2016   Procedure: ESOPHAGOGASTRODUODENOSCOPY (EGD);  Surgeon: Rogene Houston, MD;  [facility];  Service: Endoscopy;  Laterality: N/A;  11:15  . EYE SURGERY     cataract removal  . NECK SURGERY  12/13/2018  . REVERSE SHOULDER ARTHROPLASTY Left 04/03/2019   Procedure: REVERSE SHOULDER ARTHROPLASTY;  Surgeon: Hiram Gash, MD;  [facility];  Service: Orthopedics;  Laterality: Left;  Marland Kitchen VAGINAL HYSTERECTOMY      There were no vitals filed for this visit.  Subjective Assessment - 07/19/19 0832    Subjective   S: It wasn't sore yesterday after the session.    Currently in Pain?  No/denies         [facility] OT Assessment - 07/19/19 2725      Assessment   Medical Diagnosis  s/p left reverse TSA      Precautions   Precautions  Shoulder    Type of Shoulder Precautions  See protocol                OT Treatments/Exercises (  OP) - 07/19/19 0833      Exercises   Exercises  Shoulder      Shoulder Exercises: Supine   Protraction  PROM;5 reps    Horizontal ABduction  PROM;5 reps    External Rotation  PROM;5 reps    Internal Rotation  PROM;5 reps    Flexion  PROM;5 reps    ABduction  PROM;5 reps      Shoulder Exercises: Standing   Other Standing Exercises  [facility] A/ROM 10X      Shoulder Exercises: Therapy Ball   Other Therapy Ball Exercises  green therapy ball; 15X; chest press, flexion, circles both directions      Shoulder Exercises: ROM/Strengthening   Over Head Lace  2' seated with 1lb wrist weight    X to V Arms  15X    Other ROM/Strengthening Exercises  unilateral carry 90-90-90 with 1#; 2'      Shoulder Exercises: Stretch   Other Shoulder Stretches   Doorway stretch; 20' 2 sets      Manual Therapy   Manual Therapy  Myofascial release    Manual therapy comments  completed separately from therapeutic exercises    Myofascial Release  myofascial release to left upper arm, anterior shoulder, trapezius, and scapularis regions to decrease fascial restrictions and increase joint ROM.              OT Education - 07/19/19 0913    Education Details  Education on elbow extension stretch using the edge of a table. (fist on edge of table. Use right hand above elbow to push elbow into extension then hold)    Person(s) Educated  Patient    Methods  Explanation;Demonstration    Comprehension  Verbalized understanding;Returned demonstration       OT Short Term Goals - 07/18/19 1247      OT SHORT TERM GOAL #1   Title  Pt will be provided with and educated on HEP to improve mobility in LUE required for ADL completion.    Time  4    Period  Weeks    Target Date  06/14/19      OT SHORT TERM GOAL #2   Title  Pt will increase LUE P/ROM to [facility] to improve ability to use LUE as assist with dressing tasks.    Time  4    Period  Weeks      OT SHORT TERM GOAL #3   Title  Pt will increase LUE strength to 4-/5 to improve ability to use LUE to reach items above shoulder height.    Time  4    Period  Weeks    Status  Partially Met        OT Long Term Goals - 07/18/19 1247      OT LONG TERM GOAL #1   Title  Pt will return to highest level of functioning using LUE as non-dominant during ADL completion.    Time  8    Period  Weeks    Status  On-going      OT LONG TERM GOAL #2   Title  Pt will decrease LUE pain to 3/10 or less to improve ability to sleep for 4 consecutive hours or more at night.    Time  8    Period  Weeks      OT LONG TERM GOAL #3   Title  Pt will decrease LUE fascial restrictions to min amounts or less to improve mobility required for functional reaching tasks.  Time  8    Period  Weeks      OT LONG TERM GOAL #4    Title  Pt will increase A/ROM in LUE to [facility] to improve ability to reach into overhead cabinets and behind back.    Time  2    Period  Weeks    Status  On-going      OT LONG TERM GOAL #5   Title  Pt will increase LUE strength to 4+/5 or greater to improve ability to fish with LUE.    Time  8    Period  Weeks    Status  On-going            Plan - 07/19/19 0908    Clinical Impression Statement  A: Progressed shoulder strengthening by using a 1# wrist weight during overhead lacing. Demonstrated max difficulty when reaching above shoulder level with some compenatory lateral leans to reach top of chain. Rest breaks were provided as needed. VC for form and technique. Manual techniques were completed to address fascial restrictions. Pt presents with less restrictions in the left medial deltoid compared to yesterday.    Body Structure / Function / Physical Skills  ADL;Endurance;UE functional use;Fascial restriction;Pain;ROM;IADL;Strength    Plan  P: Continue with unilateral carry 90-90-90 increasing time by 15".    Consulted and Agree with Plan of Care  Patient       Patient will benefit from skilled therapeutic intervention in order to improve the following deficits and impairments:   Body Structure / Function / Physical Skills: ADL, Endurance, UE functional use, Fascial restriction, Pain, ROM, IADL, Strength       Visit Diagnosis: Stiffness of left shoulder, not elsewhere classified  Other symptoms and signs involving the musculoskeletal system  Acute pain of left shoulder    Problem List Patient Active Problem List   Diagnosis Date Noted  . Hoarseness   . Constipation   . Other dysphagia   . 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  . Insomnia 03/15/2019  . Abdominal pain, epigastric 05/09/2016  . Belching 05/09/2016  . Hyperlipidemia 06/18/2014  . Hyperglycemia 06/18/2014  . Gout 06/18/2014  . Osteopenia 10/28/2013  . Right foot pain 01/31/2013  .  Hypertension 03/29/2011  . High cholesterol 03/29/2011   Ailene Ravel, OTR/L,CBIS  [phone]  07/19/2019, 9:13 AM  [place] [address] [place], [place], [address] Phone: [phone]   Fax:  [phone]  Name: Terri Douglas MRN: [id number] Date of Birth: March 25, 1946

## 2019-07-23 ENCOUNTER — Encounter (HOSPITAL_COMMUNITY): Payer: Self-pay

## 2019-07-23 ENCOUNTER — Other Ambulatory Visit: Payer: Self-pay

## 2019-07-23 ENCOUNTER — Ambulatory Visit (HOSPITAL_COMMUNITY): Payer: Medicare Other

## 2019-07-23 DIAGNOSIS — R29898 Other symptoms and signs involving the musculoskeletal system: Secondary | ICD-10-CM

## 2019-07-23 DIAGNOSIS — M25612 Stiffness of left shoulder, not elsewhere classified: Secondary | ICD-10-CM | POA: Diagnosis not present

## 2019-07-23 DIAGNOSIS — M25512 Pain in left shoulder: Secondary | ICD-10-CM

## 2019-07-23 NOTE — Therapy (Signed)
[place], [place], [address] Phone: [phone]   Fax:  [phone]  Occupational Therapy Treatment  Patient Details  Name: Terri Douglas MRN: [id number] Date of Birth: 12/24/1945 Referring Provider (OT): [name], [name] (Surgeon-[name])   Encounter Date: 07/23/2019  OT End of Session - 07/23/19 1003    Visit Number  19    Number of Visits  24    Date for OT Re-Evaluation  08/08/19    Authorization Type  1) Medicare A & B; BCBS Supplement    Authorization Time Period  progress note by 26th visit    Authorization - Visit Number  19    Authorization - Number of Visits  26    OT Start Time  [phone]   pt arrive late   OT Stop Time  1023    OT Time Calculation (min)  33 min    Activity Tolerance  Patient tolerated treatment well    Behavior During Therapy  [facility] for tasks assessed/performed       Past Medical History:  Diagnosis Date  . Arthritis   . Complication of anesthesia   . Gall stones   . GERD (gastroesophageal reflux disease)   . History of cardiac catheterization 2005   Minor elevation in cardiac enzymes however no significant obstructive CAD  . Hyperlipidemia   . Hypertension   . Insomnia   . PONV (postoperative nausea and vomiting)   . Prediabetes     Past Surgical History:  Procedure Laterality Date  . A-FLUTTER ABLATION N/A 08/01/2017   Procedure: A-FLUTTER ABLATION;  Surgeon: Thompson Grayer, MD;  Location: [place] [facility];  Service: Cardiovascular;  Laterality: N/A;  . ABDOMINAL HYSTERECTOMY    . BACK SURGERY  1992  . CARDIAC CATHETERIZATION     BACK IN 2004  SHE THINKS IT CAME BACK 'NORMAL'  . CHOLECYSTECTOMY N/A 06/24/2015   Procedure: LAPAROSCOPIC CHOLECYSTECTOMY;  Surgeon: Mickeal Skinner, MD;  Location: [place];  Service: General;  Laterality: N/A;  . COLONOSCOPY N/A 08/30/2012   Procedure: COLONOSCOPY;  Surgeon: Rogene Houston, MD;  [facility];  Service:  Endoscopy;  Laterality: N/A;  830  . DILATION AND CURETTAGE OF UTERUS    . ESOPHAGOGASTRODUODENOSCOPY  04/15/2011   Procedure: ESOPHAGOGASTRODUODENOSCOPY (EGD);  Surgeon: Rogene Houston, MD;  [facility];  Service: Endoscopy;  Laterality: N/A;  11:30  . ESOPHAGOGASTRODUODENOSCOPY N/A 05/27/2016   Procedure: ESOPHAGOGASTRODUODENOSCOPY (EGD);  Surgeon: Rogene Houston, MD;  [facility];  Service: Endoscopy;  Laterality: N/A;  11:15  . EYE SURGERY     cataract removal  . NECK SURGERY  12/13/2018  . REVERSE SHOULDER ARTHROPLASTY Left 04/03/2019   Procedure: REVERSE SHOULDER ARTHROPLASTY;  Surgeon: Hiram Gash, MD;  [facility];  Service: Orthopedics;  Laterality: Left;  Marland Kitchen VAGINAL HYSTERECTOMY      There were no vitals filed for this visit.  Subjective Assessment - 07/23/19 1001    Subjective   S: Nothing new to report.    Currently in Pain?  No/denies         [facility] OT Assessment - 07/23/19 1002      Assessment   Medical Diagnosis  s/p left reverse TSA      Precautions   Precautions  Shoulder    Type of Shoulder Precautions  See protocol                OT  Treatments/Exercises (OP) - 07/23/19 1002      Exercises   Exercises  Shoulder      Shoulder Exercises: Supine   Protraction  PROM;5 reps    Horizontal ABduction  PROM;5 reps    External Rotation  PROM;5 reps    Internal Rotation  PROM;5 reps    Flexion  PROM;5 reps    ABduction  PROM;5 reps      Shoulder Exercises: Standing   Other Standing Exercises  [facility] A/ROM 10X      Shoulder Exercises: Therapy Ball   Other Therapy Ball Exercises  green therapy ball; 15X; chest press, flexion, circles both directions      Shoulder Exercises: ROM/Strengthening   UBE (Upper Arm Bike)  Level 3 3' reverse    pace: 11.0-13.0   Over Head Lace  2' seated with 1lb wrist weight    X to V Arms  15X    Ball on Wall  1' flexion 1' abduction green ball    Other ROM/Strengthening Exercises   unilateral carry 90-90-90 with 1#; 1'15"      Manual Therapy   Manual Therapy  Myofascial release    Manual therapy comments  completed separately from therapeutic exercises    Myofascial Release  myofascial release to left upper arm, anterior shoulder, trapezius, and scapularis regions to decrease fascial restrictions and increase joint ROM.                OT Short Term Goals - 07/18/19 1247      OT SHORT TERM GOAL #1   Title  Pt will be provided with and educated on HEP to improve mobility in LUE required for ADL completion.    Time  4    Period  Weeks    Target Date  06/14/19      OT SHORT TERM GOAL #2   Title  Pt will increase LUE P/ROM to [facility] to improve ability to use LUE as assist with dressing tasks.    Time  4    Period  Weeks      OT SHORT TERM GOAL #3   Title  Pt will increase LUE strength to 4-/5 to improve ability to use LUE to reach items above shoulder height.    Time  4    Period  Weeks    Status  Partially Met        OT Long Term Goals - 07/18/19 1247      OT LONG TERM GOAL #1   Title  Pt will return to highest level of functioning using LUE as non-dominant during ADL completion.    Time  8    Period  Weeks    Status  On-going      OT LONG TERM GOAL #2   Title  Pt will decrease LUE pain to 3/10 or less to improve ability to sleep for 4 consecutive hours or more at night.    Time  8    Period  Weeks      OT LONG TERM GOAL #3   Title  Pt will decrease LUE fascial restrictions to min amounts or less to improve mobility required for functional reaching tasks.    Time  8    Period  Weeks      OT LONG TERM GOAL #4   Title  Pt will increase A/ROM in LUE to [facility] to improve ability to reach into overhead cabinets and behind back.    Time  2  Period  Weeks    Status  On-going      OT LONG TERM GOAL #5   Title  Pt will increase LUE strength to 4+/5 or greater to improve ability to fish with LUE.    Time  8    Period  Weeks    Status  On-going             Plan - 07/23/19 1012    Clinical Impression Statement  A: Added 15" to unilateral carry with 1#. Required fairly consistant VC for form and technique during task as well as VC during session. Manual techniques completed to address fascial restrictions in the left upper arm.    Body Structure / Function / Physical Skills  ADL;Endurance;UE functional use;Fascial restriction;Pain;ROM;IADL;Strength    Plan  P: Continue to work on shoulder and scapular strengthening at and above shoulder level.    Consulted and Agree with Plan of Care  Patient       Patient will benefit from skilled therapeutic intervention in order to improve the following deficits and impairments:   Body Structure / Function / Physical Skills: ADL, Endurance, UE functional use, Fascial restriction, Pain, ROM, IADL, Strength       Visit Diagnosis: Other symptoms and signs involving the musculoskeletal system  Acute pain of left shoulder  Stiffness of left shoulder, not elsewhere classified    Problem List Patient Active Problem List   Diagnosis Date Noted  . Hoarseness   . Constipation   . Other dysphagia   . 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  . Insomnia 03/15/2019  . Abdominal pain, epigastric 05/09/2016  . Belching 05/09/2016  . Hyperlipidemia 06/18/2014  . Hyperglycemia 06/18/2014  . Gout 06/18/2014  . Osteopenia 10/28/2013  . Right foot pain 01/31/2013  . Hypertension 03/29/2011  . High cholesterol 03/29/2011   Ailene Ravel, OTR/L,CBIS  [phone]  07/23/2019, 12:21 PM  [place], [place], [address] Phone: [phone]   Fax:  [phone]  Name: Terri Douglas MRN: [id number] Date of Birth: 07/15/1945

## 2019-07-26 ENCOUNTER — Other Ambulatory Visit: Payer: Self-pay

## 2019-07-26 ENCOUNTER — Encounter (HOSPITAL_COMMUNITY): Payer: Self-pay

## 2019-07-26 ENCOUNTER — Ambulatory Visit (HOSPITAL_COMMUNITY): Payer: Medicare Other

## 2019-07-26 DIAGNOSIS — R29898 Other symptoms and signs involving the musculoskeletal system: Secondary | ICD-10-CM

## 2019-07-26 DIAGNOSIS — M25612 Stiffness of left shoulder, not elsewhere classified: Secondary | ICD-10-CM | POA: Diagnosis not present

## 2019-07-26 DIAGNOSIS — M25512 Pain in left shoulder: Secondary | ICD-10-CM | POA: Diagnosis not present

## 2019-07-26 NOTE — Therapy (Signed)
[place], [place], [address] Phone: [phone]   Fax:  [phone]  Occupational Therapy Treatment  Patient Details  Name: Terri Douglas MRN: [id number] Date of Birth: 11-10-1945 Referring Provider (OT): [name], [name] (Surgeon-[name])   Encounter Date: 07/26/2019  OT End of Session - 07/26/19 1027    Visit Number  20    Number of Visits  24    Date for OT Re-Evaluation  08/08/19    Authorization Type  1) Medicare A & B; BCBS Supplement    Authorization Time Period  progress note by 26th visit    Authorization - Visit Number  20    Authorization - Number of Visits  26    OT Start Time  [phone]    OT Stop Time  1023    OT Time Calculation (min)  36 min    Activity Tolerance  Patient tolerated treatment well    Behavior During Therapy  [facility] for tasks assessed/performed       Past Medical History:  Diagnosis Date  . Arthritis   . Complication of anesthesia   . Gall stones   . GERD (gastroesophageal reflux disease)   . History of cardiac catheterization 2005   Minor elevation in cardiac enzymes however no significant obstructive CAD  . Hyperlipidemia   . Hypertension   . Insomnia   . PONV (postoperative nausea and vomiting)   . Prediabetes     Past Surgical History:  Procedure Laterality Date  . A-FLUTTER ABLATION N/A 08/01/2017   Procedure: A-FLUTTER ABLATION;  Surgeon: Thompson Grayer, MD;  Location: [place] [facility];  Service: Cardiovascular;  Laterality: N/A;  . ABDOMINAL HYSTERECTOMY    . BACK SURGERY  1992  . CARDIAC CATHETERIZATION     BACK IN 2004  SHE THINKS IT CAME BACK 'NORMAL'  . CHOLECYSTECTOMY N/A 06/24/2015   Procedure: LAPAROSCOPIC CHOLECYSTECTOMY;  Surgeon: Mickeal Skinner, MD;  Location: [place];  Service: General;  Laterality: N/A;  . COLONOSCOPY N/A 08/30/2012   Procedure: COLONOSCOPY;  Surgeon: Rogene Houston, MD;  [facility];  Service: Endoscopy;   Laterality: N/A;  830  . DILATION AND CURETTAGE OF UTERUS    . ESOPHAGOGASTRODUODENOSCOPY  04/15/2011   Procedure: ESOPHAGOGASTRODUODENOSCOPY (EGD);  Surgeon: Rogene Houston, MD;  [facility];  Service: Endoscopy;  Laterality: N/A;  11:30  . ESOPHAGOGASTRODUODENOSCOPY N/A 05/27/2016   Procedure: ESOPHAGOGASTRODUODENOSCOPY (EGD);  Surgeon: Rogene Houston, MD;  [facility];  Service: Endoscopy;  Laterality: N/A;  11:15  . EYE SURGERY     cataract removal  . NECK SURGERY  12/13/2018  . REVERSE SHOULDER ARTHROPLASTY Left 04/03/2019   Procedure: REVERSE SHOULDER ARTHROPLASTY;  Surgeon: Hiram Gash, MD;  [facility];  Service: Orthopedics;  Laterality: Left;  Marland Kitchen VAGINAL HYSTERECTOMY      There were no vitals filed for this visit.  Subjective Assessment - 07/26/19 0950    Subjective   S: It was a little sore after last session but nothing bad.    Currently in Pain?  No/denies                   OT Treatments/Exercises (OP) - 07/26/19 0959      Exercises   Exercises  Shoulder      Shoulder Exercises: Supine   Protraction  PROM;5 reps    Horizontal ABduction  PROM;5 reps    External Rotation  PROM;5 reps    Internal Rotation  PROM;5 reps    Flexion  PROM;5 reps    ABduction  PROM;5 reps      Shoulder Exercises: Sidelying   Flexion  Strengthening;10 reps    Flexion Weight (lbs)  1    ABduction  Strengthening;10 reps    ABduction Weight (lbs)  1    Other Sidelying Exercises  Horizontal abduction; 10X; 1#      Shoulder Exercises: Standing   Flexion  Strengthening;15 reps   unilateral; completing in top half of range   ABduction  AROM;15 reps   unilateral; completing in top half of range only     Shoulder Exercises: Therapy Ball   Other Therapy Ball Exercises  green therapy ball; 15X; flexion    Other Therapy Ball Exercises  Green therapy ball wall walk with a 2-3 second hold stretch with arm extended overhead. 10X      Shoulder  Exercises: ROM/Strengthening   Over Head Lace  2' seated with 1lb wrist weight    X to V Arms  10X with 1#      Manual Therapy   Manual Therapy  Myofascial release    Manual therapy comments  completed separately from therapeutic exercises    Myofascial Release  myofascial release to left upper arm, anterior shoulder, trapezius, and scapularis regions to decrease fascial restrictions and increase joint ROM.                OT Short Term Goals - 07/18/19 1247      OT SHORT TERM GOAL #1   Title  Pt will be provided with and educated on HEP to improve mobility in LUE required for ADL completion.    Time  4    Period  Weeks    Target Date  06/14/19      OT SHORT TERM GOAL #2   Title  Pt will increase LUE P/ROM to [facility] to improve ability to use LUE as assist with dressing tasks.    Time  4    Period  Weeks      OT SHORT TERM GOAL #3   Title  Pt will increase LUE strength to 4-/5 to improve ability to use LUE to reach items above shoulder height.    Time  4    Period  Weeks    Status  Partially Met        OT Long Term Goals - 07/18/19 1247      OT LONG TERM GOAL #1   Title  Pt will return to highest level of functioning using LUE as non-dominant during ADL completion.    Time  8    Period  Weeks    Status  On-going      OT LONG TERM GOAL #2   Title  Pt will decrease LUE pain to 3/10 or less to improve ability to sleep for 4 consecutive hours or more at night.    Time  8    Period  Weeks      OT LONG TERM GOAL #3   Title  Pt will decrease LUE fascial restrictions to min amounts or less to improve mobility required for functional reaching tasks.    Time  8    Period  Weeks      OT LONG TERM GOAL #4   Title  Pt will increase A/ROM in LUE to [facility] to improve ability to reach into overhead cabinets and behind back.    Time  2    Period  Weeks    Status  On-going      OT LONG TERM GOAL #5   Title  Pt will increase LUE strength to 4+/5 or greater to improve ability to  fish with LUE.    Time  8    Period  Weeks    Status  On-going            Plan - 07/26/19 1027    Clinical Impression Statement  P: Continued to focus on UB strengthening using 1# weight for sidelying and standing exercises. patient was able to progress to X to V arms while holding 1# hand weights. Demonstrated muscle fatigue during strengthening and took rest breaks as needed. VC for form and technique.    Body Structure / Function / Physical Skills  ADL;Endurance;UE functional use;Fascial restriction;Pain;ROM;IADL;Strength    Plan  P: Continue to work on shoulder and scapular strengthening at and above shoulder level. Prone strengthening with 1# weight or just A/ROM.    Consulted and Agree with Plan of Care  Patient       Patient will benefit from skilled therapeutic intervention in order to improve the following deficits and impairments:   Body Structure / Function / Physical Skills: ADL, Endurance, UE functional use, Fascial restriction, Pain, ROM, IADL, Strength       Visit Diagnosis: Acute pain of left shoulder  Other symptoms and signs involving the musculoskeletal system  Stiffness of left shoulder, not elsewhere classified    Problem List Patient Active Problem List   Diagnosis Date Noted  . Hoarseness   . Constipation   . Other dysphagia   . 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  . Insomnia 03/15/2019  . Abdominal pain, epigastric 05/09/2016  . Belching 05/09/2016  . Hyperlipidemia 06/18/2014  . Hyperglycemia 06/18/2014  . Gout 06/18/2014  . Osteopenia 10/28/2013  . Right foot pain 01/31/2013  . Hypertension 03/29/2011  . High cholesterol 03/29/2011   Ailene Ravel, OTR/L,CBIS  [phone]  07/26/2019, 10:30 AM  [place] [address] [place], [place], [address] Phone: [phone]   Fax:  [phone]  Name: KIERRIA FEIGENBAUM MRN: [id number] Date of Birth: 1945/12/03

## 2019-07-30 ENCOUNTER — Other Ambulatory Visit: Payer: Self-pay

## 2019-07-30 ENCOUNTER — Ambulatory Visit (HOSPITAL_COMMUNITY): Payer: Medicare Other

## 2019-07-30 ENCOUNTER — Encounter (HOSPITAL_COMMUNITY): Payer: Self-pay

## 2019-07-30 DIAGNOSIS — R29898 Other symptoms and signs involving the musculoskeletal system: Secondary | ICD-10-CM

## 2019-07-30 DIAGNOSIS — M25512 Pain in left shoulder: Secondary | ICD-10-CM

## 2019-07-30 DIAGNOSIS — M25612 Stiffness of left shoulder, not elsewhere classified: Secondary | ICD-10-CM | POA: Diagnosis not present

## 2019-07-30 NOTE — Therapy (Signed)
[facility] [address] [place], [place], [address] Phone: [phone]   Fax:  [phone]  Occupational Therapy Treatment  Patient Details  Name: Terri Douglas MRN: [id number] Date of Birth: 09/30/1945 Referring Provider (OT): Caroline McBane, [name] (Surgeon-Dr. Dax Varkey)   Encounter Date: 07/30/2019  OT End of Session - 07/30/19 1230    Visit Number  21    Number of Visits  24    Date for OT Re-Evaluation  08/08/19    Authorization Type  1) Medicare A & B; BCBS Supplement    Authorization Time Period  progress note by 26th visit    Authorization - Visit Number  21    Authorization - Number of Visits  26    OT Start Time  0945    OT Stop Time  1023    OT Time Calculation (min)  38 min    Activity Tolerance  Patient tolerated treatment well    Behavior During Therapy  WFL for tasks assessed/performed       Past Medical History:  Diagnosis Date  . Arthritis   . Complication of anesthesia   . Gall stones   . GERD (gastroesophageal reflux disease)   . History of cardiac catheterization 2005   Minor elevation in cardiac enzymes however no significant obstructive CAD  . Hyperlipidemia   . Hypertension   . Insomnia   . PONV (postoperative nausea and vomiting)   . Prediabetes     Past Surgical History:  Procedure Laterality Date  . A-FLUTTER ABLATION N/A 08/01/2017   Procedure: A-FLUTTER ABLATION;  Surgeon: Allred, James, MD;  [facility]: [facility];  Service: Cardiovascular;  Laterality: N/A;  . ABDOMINAL HYSTERECTOMY    . BACK SURGERY  1992  . CARDIAC CATHETERIZATION     BACK IN 2004  SHE THINKS IT CAME BACK 'NORMAL'  . CHOLECYSTECTOMY N/A 06/24/2015   Procedure: LAPAROSCOPIC CHOLECYSTECTOMY;  Surgeon: Luke Aaron Kinsinger, MD;  Location: [facility];  Service: General;  Laterality: N/A;  . COLONOSCOPY N/A 08/30/2012   Procedure: COLONOSCOPY;  Surgeon: Najeeb U Rehman, MD;  [facility];  Service: Endoscopy;   Laterality: N/A;  830  . DILATION AND CURETTAGE OF UTERUS    . ESOPHAGOGASTRODUODENOSCOPY  04/15/2011   Procedure: ESOPHAGOGASTRODUODENOSCOPY (EGD);  Surgeon: Najeeb U Rehman, MD;  [facility];  Service: Endoscopy;  Laterality: N/A;  11:30  . ESOPHAGOGASTRODUODENOSCOPY N/A 05/27/2016   Procedure: ESOPHAGOGASTRODUODENOSCOPY (EGD);  Surgeon: Najeeb U Rehman, MD;  [facility];  Service: Endoscopy;  Laterality: N/A;  11:15  . EYE SURGERY     cataract removal  . NECK SURGERY  12/13/2018  . REVERSE SHOULDER ARTHROPLASTY Left 04/03/2019   Procedure: REVERSE SHOULDER ARTHROPLASTY;  Surgeon: Varkey, Dax T, MD;  Location: [facility];  Service: Orthopedics;  Laterality: Left;  . VAGINAL HYSTERECTOMY      There were no vitals filed for this visit.  Subjective Assessment - 07/30/19 1001    Subjective   S: It's feeling good today.    Currently in Pain?  No/denies         [facility] OT Assessment - 07/30/19 1001      Assessment   Medical Diagnosis  s/p left reverse TSA      Precautions   Precautions  Shoulder    Type of Shoulder Precautions  See protocol                OT Treatments/Exercises (OP) - 07/30/19   1002      Exercises   Exercises  Shoulder      Shoulder Exercises: Supine   Protraction  PROM;5 reps    Horizontal ABduction  PROM;5 reps    External Rotation  PROM;5 reps    Internal Rotation  PROM;5 reps    Flexion  PROM;5 reps    ABduction  PROM;5 reps      Shoulder Exercises: Prone   Flexion  AROM;10 reps    External Rotation  AROM;10 reps   with upper arm supported by mat table   Horizontal ABduction 1  AROM;10 reps    Horizontal ABduction 2  AROM;10 reps    Other Prone Exercises  Y arm; A/ROM 10X      Shoulder Exercises: Standing   Flexion  Strengthening;15 reps   top half of range used   Shoulder Flexion Weight (lbs)  1    ABduction  AROM;15 reps   top half of range used     Shoulder Exercises: ROM/Strengthening   UBE (Upper Arm Bike)   Level 3 3' reverse    pace: 11.0-13.0   Over Head Lace  2' seated with 1lb wrist weight    X to V Arms  10X with 1#    Ball on Wall  1' flexion 1' abduction green ball      Manual Therapy   Manual Therapy  Myofascial release    Manual therapy comments  completed separately from therapeutic exercises    Myofascial Release  myofascial release to left upper arm, anterior shoulder, trapezius, and scapularis regions to decrease fascial restrictions and increase joint ROM.                OT Short Term Goals - 07/18/19 1247      OT SHORT TERM GOAL #1   Title  Pt will be provided with and educated on HEP to improve mobility in LUE required for ADL completion.    Time  4    Period  Weeks    Target Date  06/14/19      OT SHORT TERM GOAL #2   Title  Pt will increase LUE P/ROM to WFL to improve ability to use LUE as assist with dressing tasks.    Time  4    Period  Weeks      OT SHORT TERM GOAL #3   Title  Pt will increase LUE strength to 4-/5 to improve ability to use LUE to reach items above shoulder height.    Time  4    Period  Weeks    Status  Partially Met        OT Long Term Goals - 07/18/19 1247      OT LONG TERM GOAL #1   Title  Pt will return to highest level of functioning using LUE as non-dominant during ADL completion.    Time  8    Period  Weeks    Status  On-going      OT LONG TERM GOAL #2   Title  Pt will decrease LUE pain to 3/10 or less to improve ability to sleep for 4 consecutive hours or more at night.    Time  8    Period  Weeks      OT LONG TERM GOAL #3   Title  Pt will decrease LUE fascial restrictions to min amounts or less to improve mobility required for functional reaching tasks.    Time  8    Period    Weeks      OT LONG TERM GOAL #4   Title  Pt will increase A/ROM in LUE to WFL to improve ability to reach into overhead cabinets and behind back.    Time  2    Period  Weeks    Status  On-going      OT LONG TERM GOAL #5   Title  Pt will  increase LUE strength to 4+/5 or greater to improve ability to fish with LUE.    Time  8    Period  Weeks    Status  On-going            Plan - 07/30/19 1230    Clinical Impression Statement  A:Added prone A/ROM shoulder exercises to focus on scapular mobility and strengthening. Limited scapular mobility created difficulty to achieve greater A/ROM. VC for form and technique were provided. Manual techniques completed to address fascial restrictions.    Body Structure / Function / Physical Skills  ADL;Endurance;UE functional use;Fascial restriction;Pain;ROM;IADL;Strength    Plan  P: Continue with strengthening of the shoulder and scapular region. Focus on any specfic activity that may be difficult at home to prepare for discharge. Increase scapular mobility.    Consulted and Agree with Plan of Care  Patient       Patient will benefit from skilled therapeutic intervention in order to improve the following deficits and impairments:   Body Structure / Function / Physical Skills: ADL, Endurance, UE functional use, Fascial restriction, Pain, ROM, IADL, Strength       Visit Diagnosis: Acute pain of left shoulder  Other symptoms and signs involving the musculoskeletal system  Stiffness of left shoulder, not elsewhere classified    Problem List Patient Active Problem List   Diagnosis Date Noted  . Hoarseness   . Constipation   . Other dysphagia   . 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  . Insomnia 03/15/2019  . Abdominal pain, epigastric 05/09/2016  . Belching 05/09/2016  . Hyperlipidemia 06/18/2014  . Hyperglycemia 06/18/2014  . Gout 06/18/2014  . Osteopenia 10/28/2013  . Right foot pain 01/31/2013  . Hypertension 03/29/2011  . High cholesterol 03/29/2011    , OTR/L,CBIS  [phone]  07/30/2019, 1:02 PM  [facility] [address] [place], [place], [address] Phone: [phone]   Fax:   [phone]  Name: Scarleth G Wortmann MRN: [id number] Date of Birth: 10/09/1945 

## 2019-08-02 ENCOUNTER — Encounter (HOSPITAL_COMMUNITY): Payer: Self-pay

## 2019-08-02 ENCOUNTER — Ambulatory Visit (HOSPITAL_COMMUNITY): Payer: Medicare Other

## 2019-08-02 ENCOUNTER — Other Ambulatory Visit: Payer: Self-pay

## 2019-08-02 DIAGNOSIS — M25512 Pain in left shoulder: Secondary | ICD-10-CM | POA: Diagnosis not present

## 2019-08-02 DIAGNOSIS — R29898 Other symptoms and signs involving the musculoskeletal system: Secondary | ICD-10-CM | POA: Diagnosis not present

## 2019-08-02 DIAGNOSIS — M25612 Stiffness of left shoulder, not elsewhere classified: Secondary | ICD-10-CM | POA: Diagnosis not present

## 2019-08-02 NOTE — Therapy (Signed)
[place], [place], [address] Phone: [phone]   Fax:  [phone]  Occupational Therapy Treatment  Patient Details  Name: Terri Douglas MRN: [id number] Date of Birth: 26-Jul-1945 Referring Provider (OT): [name], [name] (Surgeon-[name])   Encounter Date: 08/02/2019  OT End of Session - 08/02/19 1035    Visit Number  22    Number of Visits  24    Date for OT Re-Evaluation  08/08/19    Authorization Type  1) Medicare A & B; BCBS Supplement    Progress Note Due on Visit  26    OT Start Time  [phone]    OT Stop Time  1026    OT Time Calculation (min)  38 min    Activity Tolerance  Patient tolerated treatment well    Behavior During Therapy  [facility] for tasks assessed/performed       Past Medical History:  Diagnosis Date  . Arthritis   . Complication of anesthesia   . Gall stones   . GERD (gastroesophageal reflux disease)   . History of cardiac catheterization 2005   Minor elevation in cardiac enzymes however no significant obstructive CAD  . Hyperlipidemia   . Hypertension   . Insomnia   . PONV (postoperative nausea and vomiting)   . Prediabetes     Past Surgical History:  Procedure Laterality Date  . A-FLUTTER ABLATION N/A 08/01/2017   Procedure: A-FLUTTER ABLATION;  Surgeon: Thompson Grayer, MD;  Location: [place];  Service: Cardiovascular;  Laterality: N/A;  . ABDOMINAL HYSTERECTOMY    . BACK SURGERY  1992  . CARDIAC CATHETERIZATION     BACK IN 2004  SHE THINKS IT CAME BACK 'NORMAL'  . CHOLECYSTECTOMY N/A 06/24/2015   Procedure: LAPAROSCOPIC CHOLECYSTECTOMY;  Surgeon: Mickeal Skinner, MD;  Location: [place];  Service: General;  Laterality: N/A;  . COLONOSCOPY N/A 08/30/2012   Procedure: COLONOSCOPY;  Surgeon: Rogene Houston, MD;  [facility];  Service: Endoscopy;  Laterality: N/A;  830  . DILATION AND CURETTAGE OF UTERUS    . ESOPHAGOGASTRODUODENOSCOPY  04/15/2011    Procedure: ESOPHAGOGASTRODUODENOSCOPY (EGD);  Surgeon: Rogene Houston, MD;  [facility];  Service: Endoscopy;  Laterality: N/A;  11:30  . ESOPHAGOGASTRODUODENOSCOPY N/A 05/27/2016   Procedure: ESOPHAGOGASTRODUODENOSCOPY (EGD);  Surgeon: Rogene Houston, MD;  [facility];  Service: Endoscopy;  Laterality: N/A;  11:15  . EYE SURGERY     cataract removal  . NECK SURGERY  12/13/2018  . REVERSE SHOULDER ARTHROPLASTY Left 04/03/2019   Procedure: REVERSE SHOULDER ARTHROPLASTY;  Surgeon: Hiram Gash, MD;  [facility]: [facility];  Service: Orthopedics;  Laterality: Left;  Marland Kitchen VAGINAL HYSTERECTOMY      There were no vitals filed for this visit.  Subjective Assessment - 08/02/19 1034    Subjective   S: I had a hard time reaching into the dryer to get the clothes. I had to use my right arm.    Currently in Pain?  Yes    Pain Score  3     Pain Location  Shoulder    Pain Orientation  Left    Pain Descriptors / Indicators  Sore    Pain Type  Acute pain    Pain Radiating Towards  N/A    Pain Onset  In the past 7 days    Pain Frequency  Occasional    Aggravating Factors   Unknown  Pain Relieving Factors  N/A    Effect of Pain on Daily Activities  no effect         [facility] OT Assessment - 08/02/19 0954      Assessment   Medical Diagnosis  s/p left reverse TSA      Precautions   Precautions  Shoulder    Type of Shoulder Precautions  See protocol                OT Treatments/Exercises (OP) - 08/02/19 0954      Exercises   Exercises  Shoulder      Shoulder Exercises: Supine   Horizontal ABduction  PROM;5 reps    Flexion  PROM;5 reps    ABduction  PROM;5 reps    Diagonals  PROM;5 reps      Shoulder Exercises: Standing   Row  Theraband;12 reps    Theraband Level (Shoulder Row)  Level 2 (Red)    Other Standing Exercises  Red band; Reach and Row; 20X      Shoulder Exercises: Stretch   Other Shoulder Stretches  Supine stretches: W arms, T arms; 2x30"     Other Shoulder Stretches  Latissimus stretch standing which chair; modified standing child's pose with chair; 2x30"               OT Short Term Goals - 07/18/19 1247      OT SHORT TERM GOAL #1   Title  Pt will be provided with and educated on HEP to improve mobility in LUE required for ADL completion.    Time  4    Period  Weeks    Target Date  06/14/19      OT SHORT TERM GOAL #2   Title  Pt will increase LUE P/ROM to [facility] to improve ability to use LUE as assist with dressing tasks.    Time  4    Period  Weeks      OT SHORT TERM GOAL #3   Title  Pt will increase LUE strength to 4-/5 to improve ability to use LUE to reach items above shoulder height.    Time  4    Period  Weeks    Status  Partially Met        OT Long Term Goals - 07/18/19 1247      OT LONG TERM GOAL #1   Title  Pt will return to highest level of functioning using LUE as non-dominant during ADL completion.    Time  8    Period  Weeks    Status  On-going      OT LONG TERM GOAL #2   Title  Pt will decrease LUE pain to 3/10 or less to improve ability to sleep for 4 consecutive hours or more at night.    Time  8    Period  Weeks      OT LONG TERM GOAL #3   Title  Pt will decrease LUE fascial restrictions to min amounts or less to improve mobility required for functional reaching tasks.    Time  8    Period  Weeks      OT LONG TERM GOAL #4   Title  Pt will increase A/ROM in LUE to [facility] to improve ability to reach into overhead cabinets and behind back.    Time  2    Period  Weeks    Status  On-going      OT LONG TERM GOAL #5   Title  Pt will increase LUE strength to 4+/5 or greater to improve ability to fish with LUE.    Time  8    Period  Weeks    Status  On-going            Plan - 08/02/19 1047    Clinical Impression Statement  A:Session focused on scapular mobility while supine during passive stretching. Education provided throughout session for completion of stretches at home on the  floor or edge of bed. Pt was able to achieve functional movement passively while demonstrating difficulty with shoulder flexion. Reports of stretching and tightness at the upper arm/bicep region.    Body Structure / Function / Physical Skills  ADL;Endurance;UE functional use;Fascial restriction;Pain;ROM;IADL;Strength    Plan  P: Continue with scapular mobility exercises and movements to increase shoulder mobility. Look up a yoga video that is geared towards shoulders and back if possible. Provide pictures of supine scapular stretches while on towel roll.    Consulted and Agree with Plan of Care  Patient       Patient will benefit from skilled therapeutic intervention in order to improve the following deficits and impairments:   Body Structure / Function / Physical Skills: ADL, Endurance, UE functional use, Fascial restriction, Pain, ROM, IADL, Strength       Visit Diagnosis: Acute pain of left shoulder  Other symptoms and signs involving the musculoskeletal system  Stiffness of left shoulder, not elsewhere classified    Problem List Patient Active Problem List   Diagnosis Date Noted  . Hoarseness   . Constipation   . Other dysphagia   . 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  . Insomnia 03/15/2019  . Abdominal pain, epigastric 05/09/2016  . Belching 05/09/2016  . Hyperlipidemia 06/18/2014  . Hyperglycemia 06/18/2014  . Gout 06/18/2014  . Osteopenia 10/28/2013  . Right foot pain 01/31/2013  . Hypertension 03/29/2011  . High cholesterol 03/29/2011   Ailene Ravel, OTR/L,CBIS  [phone]  08/02/2019, 11:01 AM  [place] [address] [place], [place], [address] Phone: [phone]   Fax:  [phone]  Name: Terri Douglas MRN: [id number] Date of Birth: [birth year]

## 2019-08-05 DIAGNOSIS — Z6825 Body mass index (BMI) 25.0-25.9, adult: Secondary | ICD-10-CM | POA: Diagnosis not present

## 2019-08-05 DIAGNOSIS — Z779 Other contact with and (suspected) exposures hazardous to health: Secondary | ICD-10-CM | POA: Diagnosis not present

## 2019-08-05 DIAGNOSIS — Z124 Encounter for screening for malignant neoplasm of cervix: Secondary | ICD-10-CM | POA: Diagnosis not present

## 2019-08-06 ENCOUNTER — Ambulatory Visit (HOSPITAL_COMMUNITY): Payer: Medicare Other | Attending: Physician Assistant

## 2019-08-06 ENCOUNTER — Other Ambulatory Visit: Payer: Self-pay

## 2019-08-06 DIAGNOSIS — M25612 Stiffness of left shoulder, not elsewhere classified: Secondary | ICD-10-CM | POA: Diagnosis not present

## 2019-08-06 DIAGNOSIS — M25512 Pain in left shoulder: Secondary | ICD-10-CM | POA: Diagnosis not present

## 2019-08-06 DIAGNOSIS — R29898 Other symptoms and signs involving the musculoskeletal system: Secondary | ICD-10-CM

## 2019-08-06 NOTE — Therapy (Signed)
[place], [place], [address] Phone: [phone]   Fax:  [phone]  Occupational Therapy Treatment  Patient Details  Name: Terri Douglas MRN: [id number] Date of Birth: [birth year] Referring Provider (OT): [name], [name] (Surgeon-[name])   Encounter Date: 08/06/2019  OT End of Session - 08/06/19 1249    Visit Number  23    Number of Visits  24    Date for OT Re-Evaluation  08/08/19    Authorization Type  1) Medicare A & B; BCBS Supplement    Progress Note Due on Visit  26    OT Start Time  [phone]    OT Stop Time  1023    OT Time Calculation (min)  35 min    Activity Tolerance  Patient tolerated treatment well    Behavior During Therapy  [facility] for tasks assessed/performed       Past Medical History:  Diagnosis Date  . Arthritis   . Complication of anesthesia   . Gall stones   . GERD (gastroesophageal reflux disease)   . History of cardiac catheterization 2005   Minor elevation in cardiac enzymes however no significant obstructive CAD  . Hyperlipidemia   . Hypertension   . Insomnia   . PONV (postoperative nausea and vomiting)   . Prediabetes     Past Surgical History:  Procedure Laterality Date  . A-FLUTTER ABLATION N/A 08/01/2017   Procedure: A-FLUTTER ABLATION;  Surgeon: Thompson Grayer, MD;  Location: [facility] [place];  Service: Cardiovascular;  Laterality: N/A;  . ABDOMINAL HYSTERECTOMY    . BACK SURGERY  1992  . CARDIAC CATHETERIZATION     BACK IN 2004  SHE THINKS IT CAME BACK 'NORMAL'  . CHOLECYSTECTOMY N/A 06/24/2015   Procedure: LAPAROSCOPIC CHOLECYSTECTOMY;  Surgeon: Mickeal Skinner, MD;  Location: [place];  Service: General;  Laterality: N/A;  . COLONOSCOPY N/A 08/30/2012   Procedure: COLONOSCOPY;  Surgeon: Rogene Houston, MD;  [facility];  Service: Endoscopy;  Laterality: N/A;  830  . DILATION AND CURETTAGE OF UTERUS    . ESOPHAGOGASTRODUODENOSCOPY  04/15/2011   Procedure: ESOPHAGOGASTRODUODENOSCOPY (EGD);  Surgeon: Rogene Houston, MD;  [facility];  Service: Endoscopy;  Laterality: N/A;  11:30  . ESOPHAGOGASTRODUODENOSCOPY N/A 05/27/2016   Procedure: ESOPHAGOGASTRODUODENOSCOPY (EGD);  Surgeon: Rogene Houston, MD;  [facility];  Service: Endoscopy;  Laterality: N/A;  11:15  . EYE SURGERY     cataract removal  . NECK SURGERY  12/13/2018  . REVERSE SHOULDER ARTHROPLASTY Left 04/03/2019   Procedure: REVERSE SHOULDER ARTHROPLASTY;  Surgeon: Hiram Gash, MD;  [facility];  Service: Orthopedics;  Laterality: Left;  Marland Kitchen VAGINAL HYSTERECTOMY      There were no vitals filed for this visit.  Subjective Assessment - 08/06/19 1243    Subjective   S: I try to do all the exercises that we do here.    Currently in Pain?  No/denies         [facility] OT Assessment - 08/06/19 1015      Assessment   Medical Diagnosis  s/p left reverse TSA      Precautions   Precautions  Shoulder    Type of Shoulder Precautions  See protocol                OT Treatments/Exercises (OP) - 08/06/19 1015      Exercises   Exercises  Shoulder  Shoulder Exercises: Supine   Horizontal ABduction  PROM;5 reps    External Rotation  PROM;5 reps    Internal Rotation  PROM;5 reps    Flexion  PROM;5 reps    ABduction  PROM;5 reps    Diagonals  PROM;5 reps      Shoulder Exercises: Standing   Flexion  Strengthening;15 reps   staying at the top of the range.   Shoulder Flexion Weight (lbs)  1    ABduction  AROM;15 reps   staying at the top of the range.     Shoulder Exercises: ROM/Strengthening   Ball on Wall  1' flexion green ball    Other ROM/Strengthening Exercises  unilateral carry 90-90-90 with 1#; 1'20"      Shoulder Exercises: Stretch   Other Shoulder Stretches  Supine stretches: W arms, T arms; 2x30"    Other Shoulder Stretches  Latissimus stretch standing with chair; modified standing child's pose with chair; 2x30"       Manual Therapy   Manual Therapy  Myofascial release    Manual therapy comments  completed separately from therapeutic exercises    Myofascial Release  myofascial release to left upper arm, anterior shoulder, trapezius, and scapularis regions to decrease fascial restrictions and increase joint ROM.              OT Education - 08/06/19 1020    Education Details  provided with handout for 10 minute shoulder and neck yoga class on Youtube. Supine shoulder stretches: W arms and T arms. Self myofascial release with ball.    Person(s) Educated  Patient    Methods  Explanation;Demonstration;Verbal cues;Handout    Comprehension  Returned demonstration;Verbalized understanding       OT Short Term Goals - 07/18/19 1247      OT SHORT TERM GOAL #1   Title  Pt will be provided with and educated on HEP to improve mobility in LUE required for ADL completion.    Time  4    Period  Weeks    Target Date  06/14/19      OT SHORT TERM GOAL #2   Title  Pt will increase LUE P/ROM to [facility] to improve ability to use LUE as assist with dressing tasks.    Time  4    Period  Weeks      OT SHORT TERM GOAL #3   Title  Pt will increase LUE strength to 4-/5 to improve ability to use LUE to reach items above shoulder height.    Time  4    Period  Weeks    Status  Partially Met        OT Long Term Goals - 07/18/19 1247      OT LONG TERM GOAL #1   Title  Pt will return to highest level of functioning using LUE as non-dominant during ADL completion.    Time  8    Period  Weeks    Status  On-going      OT LONG TERM GOAL #2   Title  Pt will decrease LUE pain to 3/10 or less to improve ability to sleep for 4 consecutive hours or more at night.    Time  8    Period  Weeks      OT LONG TERM GOAL #3   Title  Pt will decrease LUE fascial restrictions to min amounts or less to improve mobility required for functional reaching tasks.    Time  8  Period  Weeks      OT LONG TERM GOAL #4   Title  Pt  will increase A/ROM in LUE to [facility] to improve ability to reach into overhead cabinets and behind back.    Time  2    Period  Weeks    Status  On-going      OT LONG TERM GOAL #5   Title  Pt will increase LUE strength to 4+/5 or greater to improve ability to fish with LUE.    Time  8    Period  Weeks    Status  On-going            Plan - 08/06/19 1250    Clinical Impression Statement  A: Pt was provided with additional shoulder stretches for HEP. Link for 10 minute yoga video was provided. Focused on shoulder and scapular mobility and strength with VC for form and technique provided. Manual techniques completed to address fascial restrictions in left medial deltoid region.    Body Structure / Function / Physical Skills  ADL;Endurance;UE functional use;Fascial restriction;Pain;ROM;IADL;Strength    Plan  P: Reassess and discharge with HEP. Review HEP.    Consulted and Agree with Plan of Care  Patient       Patient will benefit from skilled therapeutic intervention in order to improve the following deficits and impairments:   Body Structure / Function / Physical Skills: ADL, Endurance, UE functional use, Fascial restriction, Pain, ROM, IADL, Strength       Visit Diagnosis: Acute pain of left shoulder  Other symptoms and signs involving the musculoskeletal system  Stiffness of left shoulder, not elsewhere classified    Problem List Patient Active Problem List   Diagnosis Date Noted  . Hoarseness   . Constipation   . Other dysphagia   . 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  . Insomnia 03/15/2019  . Abdominal pain, epigastric 05/09/2016  . Belching 05/09/2016  . Hyperlipidemia 06/18/2014  . Hyperglycemia 06/18/2014  . Gout 06/18/2014  . Osteopenia 10/28/2013  . Right foot pain 01/31/2013  . Hypertension 03/29/2011  . High cholesterol 03/29/2011   Ailene Ravel, OTR/L,CBIS  [phone]  08/06/2019, 12:54 PM  [place] [address] [place], [place], [address] Phone: [phone]   Fax:  [phone]  Name: SHADIE SWEATMAN MRN: [id number] Date of Birth: 1945/06/26

## 2019-08-06 NOTE — Patient Instructions (Addendum)
Hold for 30 seconds. Complete 2 times each.   Supine T Stretch  Lie on your back, with your arms in a T position, bend your elbows and let your arms gradually fall towards the ground. Allow gravity to pull your arms, do not force them down.        Supine pec stretch  Lie on back with arms out to sides for a stretch across the chest.   Lat Stretch Standing behind a sturdy chair or counter Place your hands on the back of the support and walk back a few steps until you make an L-Shape with your body Drop your back and arms down as flat as your shoulders will allow Keep the legs straight Hold this position for 10-20 seconds SLOWLY walk back to the chair and stand tall (DO NOT let go until you know you are NOT dizzy) REST and REPEAT

## 2019-08-09 ENCOUNTER — Telehealth (INDEPENDENT_AMBULATORY_CARE_PROVIDER_SITE_OTHER): Payer: Self-pay | Admitting: Internal Medicine

## 2019-08-09 ENCOUNTER — Ambulatory Visit (HOSPITAL_COMMUNITY): Payer: Medicare Other

## 2019-08-09 ENCOUNTER — Encounter (HOSPITAL_COMMUNITY): Payer: Self-pay

## 2019-08-09 ENCOUNTER — Other Ambulatory Visit: Payer: Self-pay

## 2019-08-09 DIAGNOSIS — M25612 Stiffness of left shoulder, not elsewhere classified: Secondary | ICD-10-CM | POA: Diagnosis not present

## 2019-08-09 DIAGNOSIS — R29898 Other symptoms and signs involving the musculoskeletal system: Secondary | ICD-10-CM

## 2019-08-09 DIAGNOSIS — M25512 Pain in left shoulder: Secondary | ICD-10-CM

## 2019-08-09 NOTE — Patient Instructions (Signed)
*  USE THE YELLOW RESISTANCE TUBE FOR THESE TWO EXERCISES. UPGRADE TO THE RED WHEN YOU ARE ABLE TO COMPLETE 15 REPS USING THE YELLOW WITHOUT DIFFICULTY.    ELASTIC BAND SHOULDER ABDUCTION  While holding an elastic band at your side, draw up your arm to the side keeping your elbow straight.   10 times.     ELASTIC BAND SHOULDER EXTERNAL ROTATION - ER  While holding an elastic band at your side with your elbow bent, start with your hand near your stomach and then pull the band away. Keep your elbow at your side the entire time.   10 times.

## 2019-08-09 NOTE — Therapy (Signed)
[facility] [place], [place], [address] Phone: [phone]   Fax:  [phone]  Occupational Therapy Treatment Reassessment/discharge summary Patient Details  Name: Terri Douglas MRN: [id number] Date of Birth: 1945-10-16 Referring Provider (OT): [name], [name] (Surgeon-[name])   Encounter Date: 08/09/2019  OT End of Session - 08/09/19 1050    Visit Number  24    Number of Visits  24    Authorization Type  1) Medicare A & B; BCBS Supplement    Progress Note Due on Visit  68    OT Start Time  [phone]   reassessment and discharge   OT Stop Time  1032    OT Time Calculation (min)  45 min    Activity Tolerance  Patient tolerated treatment well    Behavior During Therapy  [facility] for tasks assessed/performed       Past Medical History:  Diagnosis Date  . Arthritis   . Complication of anesthesia   . Gall stones   . GERD (gastroesophageal reflux disease)   . History of cardiac catheterization 2005   Minor elevation in cardiac enzymes however no significant obstructive CAD  . Hyperlipidemia   . Hypertension   . Insomnia   . PONV (postoperative nausea and vomiting)   . Prediabetes     Past Surgical History:  Procedure Laterality Date  . A-FLUTTER ABLATION N/A 08/01/2017   Procedure: A-FLUTTER ABLATION;  Surgeon: Thompson Grayer, MD;  Location: [facility];  Service: Cardiovascular;  Laterality: N/A;  . ABDOMINAL HYSTERECTOMY    . BACK SURGERY  1992  . CARDIAC CATHETERIZATION     BACK IN 2004  SHE THINKS IT CAME BACK 'NORMAL'  . CHOLECYSTECTOMY N/A 06/24/2015   Procedure: LAPAROSCOPIC CHOLECYSTECTOMY;  Surgeon: Mickeal Skinner, MD;  Location: [facility];  Service: General;  Laterality: N/A;  . COLONOSCOPY N/A 08/30/2012   Procedure: COLONOSCOPY;  Surgeon: Rogene Houston, MD;  [facility];  Service: Endoscopy;  Laterality: N/A;  830  . DILATION AND CURETTAGE OF UTERUS    .  ESOPHAGOGASTRODUODENOSCOPY  04/15/2011   Procedure: ESOPHAGOGASTRODUODENOSCOPY (EGD);  Surgeon: Rogene Houston, MD;  [facility];  Service: Endoscopy;  Laterality: N/A;  11:30  . ESOPHAGOGASTRODUODENOSCOPY N/A 05/27/2016   Procedure: ESOPHAGOGASTRODUODENOSCOPY (EGD);  Surgeon: Rogene Houston, MD;  [facility];  Service: Endoscopy;  Laterality: N/A;  11:15  . EYE SURGERY     cataract removal  . NECK SURGERY  12/13/2018  . REVERSE SHOULDER ARTHROPLASTY Left 04/03/2019   Procedure: REVERSE SHOULDER ARTHROPLASTY;  Surgeon: Hiram Gash, MD;  [facility];  Service: Orthopedics;  Laterality: Left;  Marland Kitchen VAGINAL HYSTERECTOMY      There were no vitals filed for this visit.  Subjective Assessment - 08/09/19 0950    Subjective   S: I tried to do the yoga video and my legs were also tight.    Currently in Pain?  No/denies         [facility] OT Assessment - 08/09/19 0951      Assessment   Medical Diagnosis  s/p left reverse TSA      Precautions   Precautions  Shoulder    Type of Shoulder Precautions  See protocol       Observation/Other Assessments   Focus on Therapeutic Outcomes (FOTO)   64/100      AROM   Overall AROM Comments  Assessed seated, er/IR adducted  AROM Assessment Site  Shoulder    Right/Left Shoulder  Left    Left Shoulder Flexion  142 Degrees   previous: 142   Left Shoulder ABduction  130 Degrees   previous: 120   Left Shoulder Internal Rotation  90 Degrees   previous: same   Left Shoulder External Rotation  52 Degrees   previous: 55     PROM   Overall PROM Comments  Assessed supine, er/IR adducted    PROM Assessment Site  Shoulder    Right/Left Shoulder  Left    Left Shoulder Flexion  165 Degrees   previous: 158   Left Shoulder ABduction  180 Degrees   previous: same   Left Shoulder Internal Rotation  90 Degrees   previous: same   Left Shoulder External Rotation  70 Degrees   previous: same     Strength   Overall Strength  Comments  Assessed seated, er/IR adducted    Strength Assessment Site  Shoulder    Right/Left Shoulder  Left    Left Shoulder Flexion  5/5   previous: 3+/5   Left Shoulder ABduction  3+/5   previous: same   Left Shoulder Internal Rotation  5/5   previous: 4/5   Left Shoulder External Rotation  3+/5   previous: 3/5              OT Treatments/Exercises (OP) - 08/09/19 0951      Exercises   Exercises  Shoulder      Shoulder Exercises: Supine   Protraction  PROM;5 reps    Horizontal ABduction  PROM;5 reps    External Rotation  PROM;5 reps    Internal Rotation  PROM;5 reps    Flexion  PROM;5 reps    ABduction  PROM;5 reps    Diagonals  PROM;5 reps      Shoulder Exercises: Standing   External Rotation  Theraband;10 reps   with towel roll   Theraband Level (Shoulder External Rotation)  Level 1 (Yellow)    ABduction  Theraband;10 reps    Theraband Level (Shoulder ABduction)  Level 1 (Yellow)      Shoulder Exercises: Stretch   Other Shoulder Stretches  Supine stretches: W arms, T arms; 2x30"    Other Shoulder Stretches  Latissimus stretch standing with chair; modified standing child's pose with chair; 2x30"             OT Education - 08/09/19 1047    Education Details  reviewed goals, HEP, and progressing in therapy. Added external rotation and abduction with yellow resistance tubing.    Person(s) Educated  Patient    Methods  Explanation;Demonstration;Verbal cues;Handout    Comprehension  Returned demonstration;Verbalized understanding       OT Short Term Goals - 08/09/19 1013      OT SHORT TERM GOAL #1   Title  Pt will be provided with and educated on HEP to improve mobility in LUE required for ADL completion.    Time  4    Period  Weeks    Target Date  06/14/19      OT SHORT TERM GOAL #2   Title  Pt will increase LUE P/ROM to [facility] to improve ability to use LUE as assist with dressing tasks.    Time  4    Period  Weeks      OT SHORT TERM GOAL #3    Title  Pt will increase LUE strength to 4-/5 to improve ability to use LUE to reach items  above shoulder height.    Time  4    Period  Weeks    Status  Partially Met        OT Long Term Goals - 08/09/19 1013      OT LONG TERM GOAL #1   Title  Pt will return to highest level of functioning using LUE as non-dominant during ADL completion.    Time  8    Period  Weeks    Status  Achieved      OT LONG TERM GOAL #2   Title  Pt will decrease LUE pain to 3/10 or less to improve ability to sleep for 4 consecutive hours or more at night.    Time  8    Period  Weeks      OT LONG TERM GOAL #3   Title  Pt will decrease LUE fascial restrictions to min amounts or less to improve mobility required for functional reaching tasks.    Time  8    Period  Weeks      OT LONG TERM GOAL #4   Title  Pt will increase A/ROM in LUE to [facility] to improve ability to reach into overhead cabinets and behind back.    Time  2    Period  Weeks    Status  Partially Met      OT LONG TERM GOAL #5   Title  Pt will increase LUE strength to 4+/5 or greater to improve ability to fish with LUE.    Time  8    Period  Weeks    Status  Partially Met            Plan - 08/09/19 1051    Clinical Impression Statement  A: Reassessment completed this date. Patient is demonstrating functional A/ROM and is able to reach at shoulder level and slightly above. Increased strength demonstrated during flexion and IR. Continues to have some deficits with ROM and strength although able to complete HEP independently at home. Patient has met 2/3 STGs while partially meeting the third. 3/5 LTGs met with remaining partially met also.    Body Structure / Function / Physical Skills  ADL;Endurance;UE functional use;Fascial restriction;Pain;ROM;IADL;Strength    Plan  P: Discharge from OT with HEP.    Consulted and Agree with Plan of Care  Patient       Patient will benefit from skilled therapeutic intervention in order to improve the  following deficits and impairments:   Body Structure / Function / Physical Skills: ADL, Endurance, UE functional use, Fascial restriction, Pain, ROM, IADL, Strength       Visit Diagnosis: Acute pain of left shoulder  Other symptoms and signs involving the musculoskeletal system  Stiffness of left shoulder, not elsewhere classified    Problem List Patient Active Problem List   Diagnosis Date Noted  . Hoarseness   . Constipation   . Other dysphagia   . 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  . Insomnia 03/15/2019  . Abdominal pain, epigastric 05/09/2016  . Belching 05/09/2016  . Hyperlipidemia 06/18/2014  . Hyperglycemia 06/18/2014  . Gout 06/18/2014  . Osteopenia 10/28/2013  . Right foot pain 01/31/2013  . Hypertension 03/29/2011  . High cholesterol 03/29/2011   OCCUPATIONAL THERAPY DISCHARGE SUMMARY  Visits from Start of Care: 24  Current functional level related to goals / functional outcomes: See above   Remaining deficits: See above   Education / Equipment: See above Plan: Patient agrees to discharge.  Patient goals  were partially met. Patient is being discharged due to meeting the stated rehab goals.  ?????          Ailene Ravel, OTR/L,CBIS  [phone]  08/09/2019, 10:55 AM  [facility] [address] [place], [place], [address] Phone: [phone]   Fax:  [phone]  Name: LINA HITCH MRN: [id number] Date of Birth: December 24, 1945

## 2019-08-09 NOTE — Telephone Encounter (Signed)
Patient left progress report stating her swallowing is a little better but she still has the hoarseness

## 2019-08-14 DIAGNOSIS — D2261 Melanocytic nevi of right upper limb, including shoulder: Secondary | ICD-10-CM | POA: Diagnosis not present

## 2019-08-14 DIAGNOSIS — L814 Other melanin hyperpigmentation: Secondary | ICD-10-CM | POA: Diagnosis not present

## 2019-08-14 DIAGNOSIS — Z23 Encounter for immunization: Secondary | ICD-10-CM | POA: Diagnosis not present

## 2019-08-14 DIAGNOSIS — C44612 Basal cell carcinoma of skin of right upper limb, including shoulder: Secondary | ICD-10-CM | POA: Diagnosis not present

## 2019-08-14 DIAGNOSIS — L578 Other skin changes due to chronic exposure to nonionizing radiation: Secondary | ICD-10-CM | POA: Diagnosis not present

## 2019-08-14 DIAGNOSIS — L658 Other specified nonscarring hair loss: Secondary | ICD-10-CM | POA: Diagnosis not present

## 2019-08-14 DIAGNOSIS — D224 Melanocytic nevi of scalp and neck: Secondary | ICD-10-CM | POA: Diagnosis not present

## 2019-08-14 DIAGNOSIS — H61001 Unspecified perichondritis of right external ear: Secondary | ICD-10-CM | POA: Diagnosis not present

## 2019-08-14 DIAGNOSIS — D485 Neoplasm of uncertain behavior of skin: Secondary | ICD-10-CM | POA: Diagnosis not present

## 2019-08-14 DIAGNOSIS — Z85828 Personal history of other malignant neoplasm of skin: Secondary | ICD-10-CM | POA: Diagnosis not present

## 2019-08-14 DIAGNOSIS — D2271 Melanocytic nevi of right lower limb, including hip: Secondary | ICD-10-CM | POA: Diagnosis not present

## 2019-08-16 DIAGNOSIS — Z23 Encounter for immunization: Secondary | ICD-10-CM | POA: Diagnosis not present

## 2019-08-17 NOTE — Telephone Encounter (Signed)
Patient called.  No answer.  Message left on answering service. I do not believe her hoarseness is due to GERD.  Therefore recommend reevaluation by ENT specialist.  Last time she saw ENT specialist was in 2016. Patient will call office if she has any questions.

## 2019-08-21 DIAGNOSIS — J383 Other diseases of vocal cords: Secondary | ICD-10-CM | POA: Diagnosis not present

## 2019-08-21 DIAGNOSIS — R490 Dysphonia: Secondary | ICD-10-CM | POA: Diagnosis not present

## 2019-08-23 ENCOUNTER — Other Ambulatory Visit: Payer: Self-pay

## 2019-08-23 ENCOUNTER — Ambulatory Visit: Payer: Medicare Other | Attending: Otolaryngology

## 2019-08-23 DIAGNOSIS — R490 Dysphonia: Secondary | ICD-10-CM | POA: Insufficient documentation

## 2019-08-23 NOTE — Therapy (Signed)
[facility] [address] [place], [place], [address] Phone: [phone]   Fax:  [phone]  Speech Language Pathology Evaluation  Patient Details  Name: Terri Douglas MRN: [id number] Date of Birth: 22-Feb-1946 Referring Provider (SLP): Helayne Seminole., MD   Encounter Date: 08/23/2019  End of Session - 08/23/19 1702    Visit Number  1    Number of Visits  17    Date for SLP Re-Evaluation  11/21/19    SLP Start Time  1320    SLP Stop Time   1400    SLP Time Calculation (min)  40 min       Past Medical History:  Diagnosis Date  . Arthritis   . Complication of anesthesia   . Gall stones   . GERD (gastroesophageal reflux disease)   . History of cardiac catheterization 2005   Minor elevation in cardiac enzymes however no significant obstructive CAD  . Hyperlipidemia   . Hypertension   . Insomnia   . PONV (postoperative nausea and vomiting)   . Prediabetes     Past Surgical History:  Procedure Laterality Date  . A-FLUTTER ABLATION N/A 08/01/2017   Procedure: A-FLUTTER ABLATION;  Surgeon: Thompson Grayer, MD;  Location: [facility];  Service: Cardiovascular;  Laterality: N/A;  . ABDOMINAL HYSTERECTOMY    . BACK SURGERY  1992  . CARDIAC CATHETERIZATION     BACK IN 2004  SHE THINKS IT CAME BACK 'NORMAL'  . CHOLECYSTECTOMY N/A 06/24/2015   Procedure: LAPAROSCOPIC CHOLECYSTECTOMY;  Surgeon: Mickeal Skinner, MD;  Location: [facility];  Service: General;  Laterality: N/A;  . COLONOSCOPY N/A 08/30/2012   Procedure: COLONOSCOPY;  Surgeon: Rogene Houston, MD;  [facility];  Service: Endoscopy;  Laterality: N/A;  830  . DILATION AND CURETTAGE OF UTERUS    . ESOPHAGOGASTRODUODENOSCOPY  04/15/2011   Procedure: ESOPHAGOGASTRODUODENOSCOPY (EGD);  Surgeon: Rogene Houston, MD;  [facility];  Service: Endoscopy;  Laterality: N/A;  11:30  . ESOPHAGOGASTRODUODENOSCOPY N/A 05/27/2016   Procedure:  ESOPHAGOGASTRODUODENOSCOPY (EGD);  Surgeon: Rogene Houston, MD;  [facility];  Service: Endoscopy;  Laterality: N/A;  11:15  . EYE SURGERY     cataract removal  . NECK SURGERY  12/13/2018  . REVERSE SHOULDER ARTHROPLASTY Left 04/03/2019   Procedure: REVERSE SHOULDER ARTHROPLASTY;  Surgeon: Hiram Gash, MD;  [facility];  Service: Orthopedics;  Laterality: Left;  Marland Kitchen VAGINAL HYSTERECTOMY      There were no vitals filed for this visit.  Subjective Assessment - 08/23/19 1323    Subjective  "No, my voice is still bad." (pt, re: if voice therapy at [facility] helped - pt took information after that eval there and wanted to do exercises on her own at home)    Currently in Pain?  No/denies         SLP Evaluation [facility] - 08/23/19 1325      SLP Visit Information   SLP Received On  08/23/19    Referring Provider (SLP)  Helayne Seminole., MD    Onset Date  at least 2016    Medical Diagnosis  Muscle Tension Dysphonia      General Information   HPI  Pt with at least 5-year hx of hoarseness. Voice eval at [facility] in 2016, trial ST was completed - report states pt desired to take materials home and work on her own with the techniques introduced that day.  Prior Functional Status   Cognitive/Linguistic Baseline  Within functional limits                      SLP Education - 08/23/19 1701    Education Details  abdominal breathing, evaluation results, basics of muscle tension dysphonia    Person(s) Educated  Patient    Methods  Explanation;Demonstration;Verbal cues;Handout    Comprehension  Verbalized understanding;Returned demonstration;Verbal cues required;Need further instruction       SLP Short Term Goals - 08/23/19 1712      SLP SHORT TERM GOAL #1   Title  pt will perform resonant voice tasks with occasional min A in 3 sessions    Time  4    Period  Weeks    Status  New      SLP SHORT TERM GOAL #2   Title  pt will perform 2 aspects of vocal  hygiene between 3 sessions    Time  4    Period  Weeks    Status  New      SLP SHORT TERM GOAL #3   Title  pt will perform abdominal breathing (AB) in 18/20 sentences in 3 sessions    Time  4    Period  Weeks    Status  New      SLP SHORT TERM GOAL #4   Title  pt will clear throat no more than 2 times in 3 sessions    Time  4    Period  Weeks    Status  New       SLP Long Term Goals - 08/23/19 1715      SLP LONG TERM GOAL #1   Title  pt will achieve WNL voicing in 10 minutes simple-mod complex conversation using compensations in 4 sessions    Time  8    Period  Weeks   or 17 visits, for all LTGs   Status  New      SLP LONG TERM GOAL #2   Title  pt will complete resonant voice tasks with rare min A in 3 sessions    Time  8    Period  Weeks    Status  New      SLP LONG TERM GOAL #3   Title  pt will drink average > 50oz H2O/day between 4 sessions    Time  8    Period  Weeks    Status  New      SLP LONG TERM GOAL #4   Title  pt will use AB in 10 minutes simple-mod complex language in 3 sessions    Time  8    Period  Weeks    Status  New      SLP LONG TERM GOAL #5   Title  pt will use compensations for throat clearing resulting in 0 throat clears in 3 sessions    Time  8    Period  Weeks    Status  New       Plan - 08/23/19 1344    Clinical Impression Statement  Pt presents today with signs and symptoms of muscle tension dysphonia ID'd yesterday by Dr. Gavin Pound. Pt is a chest-breather. Pt cleared her throat 7 times during the 40-minute eval. When SLP reminded her ENT enccouraged pt not to clear throat she did so again and stated, "There I go again." SLP provided pt cup of water indicating she might use it instead of throat clearing  and pt took two sips during eval. Sustained /a/ was significantly lower than average at 8.2 seconds. Pt's voice is characterized as waxing and waning between moderately harsh or moderately rough. Rarely, it was mildly rough.  Her score of 50 on the Voice-Related Quality of Life (VR-QOL) equates to "poor to fair" (raw score 30). In fact, pt expressed frustration with her voice due to her husband not being able to hear her. With abdominal breathing today with a model by SLP pt had approx 80% success at rest in sitting position.    Speech Therapy Frequency  2x / week    Duration  --   8 weeks or 17 visits   Treatment/Interventions  Functional tasks;Compensatory techniques;SLP instruction and feedback;Cueing hierarchy;Patient/family education;Internal/external aids;Other (comment)   voice hygiene, resonant voice tasks, abdominal breathing   Potential to Achieve Goals  Good    Potential Considerations  Severity of impairments    SLP Home Exercise Plan  provided (AB)    Consulted and Agree with Plan of Care  Patient       Patient will benefit from skilled therapeutic intervention in order to improve the following deficits and impairments:   Hoarseness    Problem List Patient Active Problem List   Diagnosis Date Noted  . Hoarseness   . Constipation   . Other dysphagia   . 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  . Insomnia 03/15/2019  . Abdominal pain, epigastric 05/09/2016  . Belching 05/09/2016  . Hyperlipidemia 06/18/2014  . Hyperglycemia 06/18/2014  . Gout 06/18/2014  . Osteopenia 10/28/2013  . Right foot pain 01/31/2013  . Hypertension 03/29/2011  . High cholesterol 03/29/2011    Teryn Gust ,[place], CCC-SLP  08/23/2019, 5:19 PM  [facility] [address] [place], [place], [address] Phone: [phone]   Fax:  [phone]  Name: Terri Douglas MRN: [id number] Date of Birth: 07/08/1945

## 2019-08-23 NOTE — Patient Instructions (Addendum)
   Your voice box muscles are working too hard and are over-tightening when you talk.  We are going to work on returning your voice box muscles to being able to [id number] when you talk.  Begin by breathing with your belly muscles, 20 minutes twice a day. The last 3-5 minutes, when you relax (let your breath out), say "mmmmmmmmmm" and feel that buzz in your nose and lips.

## 2019-09-09 ENCOUNTER — Ambulatory Visit: Payer: Medicare Other | Attending: Otolaryngology | Admitting: Speech Pathology

## 2019-09-09 ENCOUNTER — Encounter: Payer: Self-pay | Admitting: Speech Pathology

## 2019-09-09 ENCOUNTER — Other Ambulatory Visit: Payer: Self-pay

## 2019-09-09 DIAGNOSIS — R498 Other voice and resonance disorders: Secondary | ICD-10-CM | POA: Insufficient documentation

## 2019-09-09 DIAGNOSIS — R490 Dysphonia: Secondary | ICD-10-CM | POA: Insufficient documentation

## 2019-09-09 NOTE — Therapy (Signed)
[facility] [address] [place], [place], [address] Phone: [phone]   Fax:  [phone]  Speech Language Pathology Treatment  Patient Details  Name: Terri Douglas MRN: [id number] Date of Birth: Jul 26, 1945 Referring Provider (SLP): Helayne Seminole., MD   Encounter Date: 09/09/2019  End of Session - 09/09/19 1517    Visit Number  2    Number of Visits  17    Date for SLP Re-Evaluation  11/21/19    SLP Start Time  1232    SLP Stop Time   [id number]    SLP Time Calculation (min)  40 min    Activity Tolerance  Patient tolerated treatment well       Past Medical History:  Diagnosis Date  . Arthritis   . Complication of anesthesia   . Gall stones   . GERD (gastroesophageal reflux disease)   . History of cardiac catheterization 2005   Minor elevation in cardiac enzymes however no significant obstructive CAD  . Hyperlipidemia   . Hypertension   . Insomnia   . PONV (postoperative nausea and vomiting)   . Prediabetes     Past Surgical History:  Procedure Laterality Date  . A-FLUTTER ABLATION N/A 08/01/2017   Procedure: A-FLUTTER ABLATION;  Surgeon: Thompson Grayer, MD;  Location: [facility];  Service: Cardiovascular;  Laterality: N/A;  . ABDOMINAL HYSTERECTOMY    . BACK SURGERY  1992  . CARDIAC CATHETERIZATION     BACK IN 2004  SHE THINKS IT CAME BACK 'NORMAL'  . CHOLECYSTECTOMY N/A 06/24/2015   Procedure: LAPAROSCOPIC CHOLECYSTECTOMY;  Surgeon: Mickeal Skinner, MD;  Location: [facility];  Service: General;  Laterality: N/A;  . COLONOSCOPY N/A 08/30/2012   Procedure: COLONOSCOPY;  Surgeon: Rogene Houston, MD;  [facility];  Service: Endoscopy;  Laterality: N/A;  830  . DILATION AND CURETTAGE OF UTERUS    . ESOPHAGOGASTRODUODENOSCOPY  04/15/2011   Procedure: ESOPHAGOGASTRODUODENOSCOPY (EGD);  Surgeon: Rogene Houston, MD;  [facility];  Service: Endoscopy;  Laterality: N/A;  11:30  .  ESOPHAGOGASTRODUODENOSCOPY N/A 05/27/2016   Procedure: ESOPHAGOGASTRODUODENOSCOPY (EGD);  Surgeon: Rogene Houston, MD;  [facility];  Service: Endoscopy;  Laterality: N/A;  11:15  . EYE SURGERY     cataract removal  . NECK SURGERY  12/13/2018  . REVERSE SHOULDER ARTHROPLASTY Left 04/03/2019   Procedure: REVERSE SHOULDER ARTHROPLASTY;  Surgeon: Hiram Gash, MD;  [facility];  Service: Orthopedics;  Laterality: Left;  Marland Kitchen VAGINAL HYSTERECTOMY      There were no vitals filed for this visit.  Subjective Assessment - 09/09/19 1236    Subjective  "Last night it was horrible"    Currently in Pain?  No/denies            ADULT SLP TREATMENT - 09/09/19 1505      General Information   Behavior/Cognition  Alert;Cooperative;Pleasant mood      Cognitive-Linquistic Treatment   Treatment focused on  Voice;Patient/family/caregiver education    Skilled Treatment  Pt practiced abdominal breathing with resonant hum consistently since evaluation. She demonstrated this today with rare min A and achieved forward resonance with mod I. Initiated training in semiocculed vocal tract exercises (SOVTE)  to reduce laryngeal tension and encourage forward resonance. She completed these with modeling and instruction. Zairah was unable to gargle, resulting in coughing/choking. Initiated training in flow phonation with tissue as visual feed back. Jamisha achieved clear phonation using this strategy in  articulated vowels /u-i-a/ CVCV syllables /pu-lu-pu-lu/ /su-fu-su-fu/, and short sentences "who are you, who is sue, who is lou. She was also able to maintain flow phonation transitioning from prolonges /s/ to /z/ and /f/ to /v/ on one breath. Upon completion of session, pt maintained clear phonation for 2 utterances. When I pointed this out, pt verbalized awareness of higher pitch than she has been using - I agreed this may be a more optimal pitch.       Assessment / Recommendations / Plan   Plan   Continue with current plan of care      Progression Toward Goals   Progression toward goals  Progressing toward goals       SLP Education - 09/09/19 1513    Education Details  SOVTE, flow phonation HEP for MTD       SLP Short Term Goals - 09/09/19 1516      SLP SHORT TERM GOAL #1   Title  pt will perform resonant voice tasks with occasional min A in 3 sessions    Time  4    Period  Weeks    Status  On-going      SLP SHORT TERM GOAL #2   Title  pt will perform 2 aspects of vocal hygiene between 3 sessions    Time  4    Period  Weeks    Status  On-going      SLP SHORT TERM GOAL #3   Title  pt will perform abdominal breathing (AB) in 18/20 sentences in 3 sessions    Time  4    Period  Weeks    Status  On-going      SLP SHORT TERM GOAL #4   Title  pt will clear throat no more than 2 times in 3 sessions    Time  4    Period  Weeks    Status  On-going       SLP Long Term Goals - 09/09/19 1516      SLP LONG TERM GOAL #1   Title  pt will achieve WNL voicing in 10 minutes simple-mod complex conversation using compensations in 4 sessions    Time  8    Period  Weeks   or 17 visits, for all LTGs   Status  On-going      SLP LONG TERM GOAL #2   Title  pt will complete resonant voice tasks with rare min A in 3 sessions    Time  8    Period  Weeks    Status  On-going      SLP LONG TERM GOAL #3   Title  pt will drink average > 50oz H2O/day between 4 sessions    Time  8    Period  Weeks    Status  On-going      SLP LONG TERM GOAL #4   Title  pt will use AB in 10 minutes simple-mod complex language in 3 sessions    Time  8    Period  Weeks    Status  On-going      SLP LONG TERM GOAL #5   Title  pt will use compensations for throat clearing resulting in 0 throat clears in 3 sessions    Time  8    Period  Weeks    Status  On-going       Plan - 09/09/19 1514    Clinical Impression Statement  Ongoing training and education for HEP and compensations  for muscle  tension dysphonia. Pt continues to clear her throat excessively, requires consistent cues to use throat clear alternatives. Ongoing training for vocal hygiene.Continue skilled ST to maximize voice for intelligiblity and QOL.    Speech Therapy Frequency  2x / week    Duration  --   8 weeks or 17 visits   Treatment/Interventions  Functional tasks;Compensatory techniques;SLP instruction and feedback;Cueing hierarchy;Patient/family education;Internal/external aids;Other (comment)    Potential Considerations  Severity of impairments    SLP Home Exercise Plan  provided (AB), SOVTE    Consulted and Agree with Plan of Care  Patient       Patient will benefit from skilled therapeutic intervention in order to improve the following deficits and impairments:   Other voice and resonance disorders    Problem List Patient Active Problem List   Diagnosis Date Noted  . Hoarseness   . Constipation   . Other dysphagia   . 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  . Insomnia 03/15/2019  . Abdominal pain, epigastric 05/09/2016  . Belching 05/09/2016  . Hyperlipidemia 06/18/2014  . Hyperglycemia 06/18/2014  . Gout 06/18/2014  . Osteopenia 10/28/2013  . Right foot pain 01/31/2013  . Hypertension 03/29/2011  . High cholesterol 03/29/2011    Amaree Leeper, Annye Rusk MS, CCC-SLP 09/09/2019, 3:17 PM  [facility] [address] [place], [place], [address] Phone: [phone]   Fax:  [phone]   Name: OREL RIGA MRN: [id number] Date of Birth: [birth year]

## 2019-09-09 NOTE — Patient Instructions (Signed)
Muscle Tension Dysphonia   You use pressed speech - we need to focus on airflow while you talk "Flow voice"  Have Ulice Dash tell you when you clear your throat to swallow  Ulice Dash, do not tell Minyon to clear her throat. Throat clearing is damaging to her voice box and hurts her vocal folds - we are trying to correct this  Darrelyn is to swallow hard, HUH or take a sip of water instead of clearing her throat  Keep a bottle of water with you   3 times a day:  1. Gargle 10 breaths  2. Blow bubbles with straw and voice on 10 breaths  3. Blow bubbles with pitch accent 10 breaths   Semi-occluded vocal tract exercises (SOVTE)  These allow your vocal folds to vibrate without excess tension and promotes high placement of the voice  Use SOVTE as a warm up before prolonged speaking and vocal exercises   High resistance: voicing through a stirring straw  Medium resistance: voicing through a drinking straw  Less resistance: Voiced /v/                            Lip or Tongue Trill                            Nasal "hums" /m/ and /n/                            Vowels /u/ and ee  4. Watch Vocal Straw Exercises with Lolita Cram on YouTube:  [email or web address]  Pitch Glides for 2 minutes  Accents (siren)  Hum the [employer]  A goal would be 2-3 minutes several times a day and prior to vocal exercises  As always, use good belly breathing while completing SOVTE  Use tissue -Who 10x  Fu -Sue 10x  Pu-Lu 10x   Who is Collie Siad - 5x  Who is York Cerise - 5x

## 2019-09-10 ENCOUNTER — Ambulatory Visit: Payer: Medicare Other

## 2019-09-10 DIAGNOSIS — R498 Other voice and resonance disorders: Secondary | ICD-10-CM

## 2019-09-10 DIAGNOSIS — R490 Dysphonia: Secondary | ICD-10-CM | POA: Diagnosis not present

## 2019-09-10 DIAGNOSIS — M25512 Pain in left shoulder: Secondary | ICD-10-CM | POA: Diagnosis not present

## 2019-09-10 NOTE — Patient Instructions (Signed)
3 times a day:  1. Gargle 10 breaths  2. Blow bubbles with straw and voice on 10 breaths  3. Blow bubbles with pitch accent 10 breaths   Semi-occluded vocal tract exercises (SOVTE)  These allow your vocal folds to vibrate without excess tension and promotes high placement of the voice  Use SOVTE as a warm up before prolonged speaking and vocal exercises   High resistance: voicing through a stirring straw  Medium resistance: voicing through a drinking straw  Less resistance: Voiced /v/                            Lip or Tongue Trill                            Nasal "hums" /m/ and /n/                            Vowels /u/ and ee  4. Watch Vocal Straw Exercises with Lolita Cram on YouTube:  [email or web address]  Pitch Glides for 2 minutes  Accents (siren)  Hum the [employer] (or use a straw if you feel more buzz from your lips and less gravely in your voice) A goal would be 2-3 minutes of humming or straw singing several times a day, and prior to vocal exercises below  As always, use good belly breathing while completing SOVTE  Use tissue - Who 10x  Fu -Sue 10x  Pu-Lu 10x   Who is Collie Siad - 5x  Who is Lou - 5x  WITHOUT THE TISSUE-- 5-10 times each: VOICE IS COMING FROM THE TIPPY-TIP of your LIPS!! Use belly breathing  New moon  Moo moo moo   New moon for you  You shoo the new moon

## 2019-09-10 NOTE — Therapy (Signed)
[facility] [address] [place], [place], [address] Phone: [phone]   Fax:  [phone]  Speech Language Pathology Treatment  Patient Details  Name: Terri Douglas MRN: [id number] Date of Birth: 1945-08-02 Referring Provider (SLP): Helayne Seminole., MD   Encounter Date: 09/10/2019  End of Session - 09/10/19 1728    Visit Number  3    Number of Visits  17    Date for SLP Re-Evaluation  11/21/19    SLP Start Time  52    SLP Stop Time   1145    SLP Time Calculation (min)  40 min    Activity Tolerance  Patient tolerated treatment well       Past Medical History:  Diagnosis Date  . Arthritis   . Complication of anesthesia   . Gall stones   . GERD (gastroesophageal reflux disease)   . History of cardiac catheterization 2005   Minor elevation in cardiac enzymes however no significant obstructive CAD  . Hyperlipidemia   . Hypertension   . Insomnia   . PONV (postoperative nausea and vomiting)   . Prediabetes     Past Surgical History:  Procedure Laterality Date  . A-FLUTTER ABLATION N/A 08/01/2017   Procedure: A-FLUTTER ABLATION;  Surgeon: Thompson Grayer, MD;  Location: [facility];  Service: Cardiovascular;  Laterality: N/A;  . ABDOMINAL HYSTERECTOMY    . BACK SURGERY  1992  . CARDIAC CATHETERIZATION     BACK IN 2004  SHE THINKS IT CAME BACK 'NORMAL'  . CHOLECYSTECTOMY N/A 06/24/2015   Procedure: LAPAROSCOPIC CHOLECYSTECTOMY;  Surgeon: Mickeal Skinner, MD;  Location: [facility];  Service: General;  Laterality: N/A;  . COLONOSCOPY N/A 08/30/2012   Procedure: COLONOSCOPY;  Surgeon: Rogene Houston, MD;  [facility];  Service: Endoscopy;  Laterality: N/A;  830  . DILATION AND CURETTAGE OF UTERUS    . ESOPHAGOGASTRODUODENOSCOPY  04/15/2011   Procedure: ESOPHAGOGASTRODUODENOSCOPY (EGD);  Surgeon: Rogene Houston, MD;  [facility];  Service: Endoscopy;  Laterality: N/A;  11:30  .  ESOPHAGOGASTRODUODENOSCOPY N/A 05/27/2016   Procedure: ESOPHAGOGASTRODUODENOSCOPY (EGD);  Surgeon: Rogene Houston, MD;  [facility];  Service: Endoscopy;  Laterality: N/A;  11:15  . EYE SURGERY     cataract removal  . NECK SURGERY  12/13/2018  . REVERSE SHOULDER ARTHROPLASTY Left 04/03/2019   Procedure: REVERSE SHOULDER ARTHROPLASTY;  Surgeon: Hiram Gash, MD;  [facility];  Service: Orthopedics;  Laterality: Left;  Marland Kitchen VAGINAL HYSTERECTOMY      There were no vitals filed for this visit.         ADULT SLP TREATMENT - 09/10/19 1451      General Information   Behavior/Cognition  Alert;Cooperative;Pleasant mood      Cognitive-Linquistic Treatment   Treatment focused on  Voice;Patient/family/caregiver education    Skilled Treatment  Pt practiced abdominal breathing (AB) at rest with audible inhalation, but 90%+ accuracy. Pt able to silence inhalation approx 25% of the time when asked. Suspect pt focusing intently on AB that difficult to silence inhalation. SLP reiterated training in semiocculed vocal tract exercises (SOVTE)  to reduce laryngeal tension and encourage forward resonance. SLP used phrases using nasal phonemes and low vowels /u/ and /o/ (new moon, etc, -see pt instructions). With this technique, intermittent WNL voicing was heard approx 30% of the time in pt comments between repetitions. SLP used flow phonation with tissue as visual feed back and pt achieved  clear phonation using this strategy in short sentences "who are you, who is sue, who is lou, intermittently as well.       Assessment / Recommendations / Plan   Plan  Continue with current plan of care      Progression Toward Goals   Progression toward goals  Progressing toward goals       SLP Education - 09/10/19 1727    Education Details  HEP additions with forward focus vocalization    Person(s) Educated  Patient    Methods  Explanation;Demonstration;Verbal cues;Handout    Comprehension   Verbalized understanding;Returned demonstration;Verbal cues required;Need further instruction       SLP Short Term Goals - 09/10/19 1730      SLP SHORT TERM GOAL #1   Title  pt will perform resonant voice tasks with occasional min A in 3 sessions    Time  4    Period  Weeks    Status  On-going      SLP SHORT TERM GOAL #2   Title  pt will perform 2 aspects of vocal hygiene between 3 sessions    Time  4    Period  Weeks    Status  On-going      SLP SHORT TERM GOAL #3   Title  pt will perform abdominal breathing (AB) in 18/20 sentences in 3 sessions    Time  4    Period  Weeks    Status  On-going      SLP SHORT TERM GOAL #4   Title  pt will clear throat no more than 2 times in 3 sessions    Time  4    Period  Weeks    Status  On-going       SLP Long Term Goals - 09/10/19 1730      SLP LONG TERM GOAL #1   Title  pt will achieve WNL voicing in 10 minutes simple-mod complex conversation using compensations in 4 sessions    Time  8    Period  Weeks   or 17 visits, for all LTGs   Status  On-going      SLP LONG TERM GOAL #2   Title  pt will complete resonant voice tasks with rare min A in 3 sessions    Time  8    Period  Weeks    Status  On-going      SLP LONG TERM GOAL #3   Title  pt will drink average > 50oz H2O/day between 4 sessions    Time  8    Period  Weeks    Status  On-going      SLP LONG TERM GOAL #4   Title  pt will use AB in 10 minutes simple-mod complex language in 3 sessions    Time  8    Period  Weeks    Status  On-going      SLP LONG TERM GOAL #5   Title  pt will use compensations for throat clearing resulting in 0 throat clears in 3 sessions    Time  8    Period  Weeks    Status  On-going       Plan - 09/10/19 1730    Clinical Impression Statement  Ongoing training and education for 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 Pt continues to clear her throat excessively, requires consistent cues to use throat clear alternatives.  Ongoing training for vocal hygiene, abdominal breathing, SOVTE, and flow phonation.  Continue skilled ST to maximize voice for intelligiblity and QOL.    Speech Therapy Frequency  2x / week    Duration  --   8 weeks or 17 visits   Treatment/Interventions  Functional tasks;Compensatory techniques;SLP instruction and feedback;Cueing hierarchy;Patient/family education;Internal/external aids;Other (comment)    Potential Considerations  Severity of impairments    SLP Home Exercise Plan  provided (AB), SOVTE    Consulted and Agree with Plan of Care  Patient       Patient will benefit from skilled therapeutic intervention in order to improve the following deficits and impairments:   Other voice and resonance disorders  Hoarseness    Problem List Patient Active Problem List   Diagnosis Date Noted  . Hoarseness   . Constipation   . Other dysphagia   . 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  . Insomnia 03/15/2019  . Abdominal pain, epigastric 05/09/2016  . Belching 05/09/2016  . Hyperlipidemia 06/18/2014  . Hyperglycemia 06/18/2014  . Gout 06/18/2014  . Osteopenia 10/28/2013  . Right foot pain 01/31/2013  . Hypertension 03/29/2011  . High cholesterol 03/29/2011    Kevyn Boquet ,MS, CCC-SLP  09/10/2019, 5:31 PM  [facility] [address] [place], [place], [address] Phone: [phone]   Fax:  [phone]   Name: SCHAE DOUTHAT MRN: [id number] Date of Birth: Dec 15, 1945

## 2019-09-11 ENCOUNTER — Ambulatory Visit: Payer: Medicare Other | Admitting: Speech Pathology

## 2019-09-16 ENCOUNTER — Ambulatory Visit: Payer: Medicare Other | Admitting: Speech Pathology

## 2019-09-18 ENCOUNTER — Other Ambulatory Visit: Payer: Self-pay

## 2019-09-18 ENCOUNTER — Encounter: Payer: Self-pay | Admitting: Speech Pathology

## 2019-09-18 ENCOUNTER — Ambulatory Visit: Payer: Medicare Other | Admitting: Speech Pathology

## 2019-09-18 DIAGNOSIS — R498 Other voice and resonance disorders: Secondary | ICD-10-CM

## 2019-09-18 DIAGNOSIS — R490 Dysphonia: Secondary | ICD-10-CM | POA: Diagnosis not present

## 2019-09-18 NOTE — Therapy (Signed)
[facility] [address] [place], [place], [address] Phone: [phone]   Fax:  [phone]  Speech Language Pathology Treatment  Patient Details  Name: Terri Douglas MRN: [id number] Date of Birth: 01/06/1946 Referring Provider (SLP): Helayne Seminole., MD   Encounter Date: 09/18/2019  End of Session - 09/18/19 1304    Visit Number  4    Number of Visits  17    Date for SLP Re-Evaluation  11/21/19    SLP Start Time  1017    SLP Stop Time   1057    SLP Time Calculation (min)  40 min       Past Medical History:  Diagnosis Date  . Arthritis   . Complication of anesthesia   . Gall stones   . GERD (gastroesophageal reflux disease)   . History of cardiac catheterization 2005   Minor elevation in cardiac enzymes however no significant obstructive CAD  . Hyperlipidemia   . Hypertension   . Insomnia   . PONV (postoperative nausea and vomiting)   . Prediabetes     Past Surgical History:  Procedure Laterality Date  . A-FLUTTER ABLATION N/A 08/01/2017   Procedure: A-FLUTTER ABLATION;  Surgeon: Thompson Grayer, MD;  Location: [facility];  Service: Cardiovascular;  Laterality: N/A;  . ABDOMINAL HYSTERECTOMY    . BACK SURGERY  1992  . CARDIAC CATHETERIZATION     BACK IN 2004  SHE THINKS IT CAME BACK 'NORMAL'  . CHOLECYSTECTOMY N/A 06/24/2015   Procedure: LAPAROSCOPIC CHOLECYSTECTOMY;  Surgeon: Mickeal Skinner, MD;  Location: [facility];  Service: General;  Laterality: N/A;  . COLONOSCOPY N/A 08/30/2012   Procedure: COLONOSCOPY;  Surgeon: Rogene Houston, MD;  [facility];  Service: Endoscopy;  Laterality: N/A;  830  . DILATION AND CURETTAGE OF UTERUS    . ESOPHAGOGASTRODUODENOSCOPY  04/15/2011   Procedure: ESOPHAGOGASTRODUODENOSCOPY (EGD);  Surgeon: Rogene Houston, MD;  [facility];  Service: Endoscopy;  Laterality: N/A;  11:30  . ESOPHAGOGASTRODUODENOSCOPY N/A 05/27/2016   Procedure:  ESOPHAGOGASTRODUODENOSCOPY (EGD);  Surgeon: Rogene Houston, MD;  [facility];  Service: Endoscopy;  Laterality: N/A;  11:15  . EYE SURGERY     cataract removal  . NECK SURGERY  12/13/2018  . REVERSE SHOULDER ARTHROPLASTY Left 04/03/2019   Procedure: REVERSE SHOULDER ARTHROPLASTY;  Surgeon: Hiram Gash, MD;  [facility];  Service: Orthopedics;  Laterality: Left;  Marland Kitchen VAGINAL HYSTERECTOMY      There were no vitals filed for this visit.  Subjective Assessment - 09/18/19 1259    Subjective  "I can gargle salt water but get choked on regular water"            ADULT SLP TREATMENT - 09/18/19 1024      General Information   Behavior/Cognition  Alert;Cooperative;Pleasant mood      Cognitive-Linquistic Treatment   Treatment focused on  Voice;Patient/family/caregiver education    Skilled Treatment  Pt reports inconsistent gargle, but it completing SOVTE regularly.  She is completing abdominal breathing in isolation accurately at home. She demonstrated SOVTE HEP with rare min A. Terri Douglas was able to achieve clear phonation at syllable, phrase and sentence level uisng abdominal breathing and flow phonation. The phrases/sentences did include flow sounds /s,z,f,v,sh, th/ In structure speech task (descriptions) of 3 sentneces, Terri Douglas required usual min cues to continue abdominal breathing and flow phonation.  She cleared her throat 4x this sessions, but does report carrying water  and using forceful pant  and swallow at home to reduce throat clears.       Assessment / Recommendations / Plan   Plan  Continue with current plan of care      Progression Toward Goals   Progression toward goals  Progressing toward goals       SLP Education - 09/18/19 1302    Education Details  HEP for voice, flow phonation, abdominal breathing       SLP Short Term Goals - 09/18/19 1303      SLP SHORT TERM GOAL #1   Title  pt will perform resonant voice tasks with occasional min A in 3 sessions     Time  3    Period  Weeks    Status  On-going      SLP SHORT TERM GOAL #2   Title  pt will perform 2 aspects of vocal hygiene between 3 sessions    Time  3    Period  Weeks    Status  On-going      SLP SHORT TERM GOAL #3   Title  pt will perform abdominal breathing (AB) in 18/20 sentences in 3 sessions    Time  3    Period  Weeks    Status  On-going      SLP SHORT TERM GOAL #4   Title  pt will clear throat no more than 2 times in 3 sessions    Time  3    Period  Weeks    Status  On-going       SLP Long Term Goals - 09/18/19 1304      SLP LONG TERM GOAL #1   Title  pt will achieve WNL voicing in 10 minutes simple-mod complex conversation using compensations in 4 sessions    Time  7    Period  Weeks   or 17 visits, for all LTGs   Status  On-going      SLP LONG TERM GOAL #2   Title  pt will complete resonant voice tasks with rare min A in 3 sessions    Time  7    Period  Weeks    Status  On-going      SLP LONG TERM GOAL #3   Title  pt will drink average > 50oz H2O/day between 4 sessions    Time  7    Period  Weeks    Status  On-going      SLP LONG TERM GOAL #4   Title  pt will use AB in 10 minutes simple-mod complex language in 3 sessions    Time  7    Period  Weeks    Status  On-going      SLP LONG TERM GOAL #5   Title  pt will use compensations for throat clearing resulting in 0 throat clears in 3 sessions    Time  7    Period  Weeks    Status  On-going       Plan - 09/18/19 1303    Clinical Impression Statement  Ongoing training and education for 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 Pt continues to clear her throat excessively, requires consistent cues to use throat clear alternatives. Ongoing training for vocal hygiene, abdominal breathing, SOVTE, and flow phonation. Continue skilled ST to maximize voice for intelligiblity and QOL.    Speech Therapy Frequency  2x / week    Duration  --   8 weeks or 17 visits  Treatment/Interventions   Functional tasks;Compensatory techniques;SLP instruction and feedback;Cueing hierarchy;Patient/family education;Internal/external aids;Other (comment)    Potential to Achieve Goals  Good    Potential Considerations  Severity of impairments       Patient will benefit from skilled therapeutic intervention in order to improve the following deficits and impairments:   Other voice and resonance disorders    Problem List Patient Active Problem List   Diagnosis Date Noted  . Hoarseness   . Constipation   . Other dysphagia   . 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  . Insomnia 03/15/2019  . Abdominal pain, epigastric 05/09/2016  . Belching 05/09/2016  . Hyperlipidemia 06/18/2014  . Hyperglycemia 06/18/2014  . Gout 06/18/2014  . Osteopenia 10/28/2013  . Right foot pain 01/31/2013  . Hypertension 03/29/2011  . High cholesterol 03/29/2011    Kaian Fahs, Annye Rusk MS, CCC-SLP 09/18/2019, 1:06 PM  [facility] [address] [place], [place], [address] Phone: [phone]   Fax:  [phone]   Name: YULINDA SCOVEL MRN: [id number] Date of Birth: 06/18/1945

## 2019-09-18 NOTE — Patient Instructions (Signed)
   Keep up your bubble voice, straw voice and gargle exercises with belly breaths   Use a good belly breath before each one  Say each one 5x  SSSee SSSue'sss Shshshoesss  Whooo Issss Sssue  ZZZebra'sss ZZZig zzzag at the zzzoo  Vvvince vvvowed to vvvvote  Fffeel the ffffur  Great job reducing your throat clears and increasing your water intake

## 2019-09-19 DIAGNOSIS — C44519 Basal cell carcinoma of skin of other part of trunk: Secondary | ICD-10-CM | POA: Diagnosis not present

## 2019-09-20 ENCOUNTER — Other Ambulatory Visit: Payer: Self-pay

## 2019-09-20 ENCOUNTER — Ambulatory Visit: Payer: Medicare Other

## 2019-09-20 DIAGNOSIS — R490 Dysphonia: Secondary | ICD-10-CM | POA: Diagnosis not present

## 2019-09-20 DIAGNOSIS — R498 Other voice and resonance disorders: Secondary | ICD-10-CM

## 2019-09-20 NOTE — Therapy (Signed)
[facility] [address] [place], [place], [address] Phone: [phone]   Fax:  [phone]  Speech Language Pathology Treatment  Patient Details  Name: CHESLEIGH RANKER MRN: [id number] Date of Birth: 01-04-1946 Referring Provider (SLP): Helayne Seminole., MD   Encounter Date: 09/20/2019  End of Session - 09/20/19 0912    Visit Number  5    Number of Visits  17    Date for SLP Re-Evaluation  11/21/19    SLP Start Time  0805    SLP Stop Time   0845    SLP Time Calculation (min)  40 min    Activity Tolerance  Patient tolerated treatment well       Past Medical History:  Diagnosis Date  . Arthritis   . Complication of anesthesia   . Gall stones   . GERD (gastroesophageal reflux disease)   . History of cardiac catheterization 2005   Minor elevation in cardiac enzymes however no significant obstructive CAD  . Hyperlipidemia   . Hypertension   . Insomnia   . PONV (postoperative nausea and vomiting)   . Prediabetes     Past Surgical History:  Procedure Laterality Date  . A-FLUTTER ABLATION N/A 08/01/2017   Procedure: A-FLUTTER ABLATION;  Surgeon: Thompson Grayer, MD;  Location: [facility];  Service: Cardiovascular;  Laterality: N/A;  . ABDOMINAL HYSTERECTOMY    . BACK SURGERY  1992  . CARDIAC CATHETERIZATION     BACK IN 2004  SHE THINKS IT CAME BACK 'NORMAL'  . CHOLECYSTECTOMY N/A 06/24/2015   Procedure: LAPAROSCOPIC CHOLECYSTECTOMY;  Surgeon: Mickeal Skinner, MD;  Location: [facility];  Service: General;  Laterality: N/A;  . COLONOSCOPY N/A 08/30/2012   Procedure: COLONOSCOPY;  Surgeon: Rogene Houston, MD;  [facility];  Service: Endoscopy;  Laterality: N/A;  830  . DILATION AND CURETTAGE OF UTERUS    . ESOPHAGOGASTRODUODENOSCOPY  04/15/2011   Procedure: ESOPHAGOGASTRODUODENOSCOPY (EGD);  Surgeon: Rogene Houston, MD;  [facility];  Service: Endoscopy;  Laterality: N/A;  11:30  .  ESOPHAGOGASTRODUODENOSCOPY N/A 05/27/2016   Procedure: ESOPHAGOGASTRODUODENOSCOPY (EGD);  Surgeon: Rogene Houston, MD;  [facility];  Service: Endoscopy;  Laterality: N/A;  11:15  . EYE SURGERY     cataract removal  . NECK SURGERY  12/13/2018  . REVERSE SHOULDER ARTHROPLASTY Left 04/03/2019   Procedure: REVERSE SHOULDER ARTHROPLASTY;  Surgeon: Hiram Gash, MD;  [facility];  Service: Orthopedics;  Laterality: Left;  Marland Kitchen VAGINAL HYSTERECTOMY      There were no vitals filed for this visit.  Subjective Assessment - 09/20/19 0810    Subjective  "We did you are my sunshine through the straw."    Currently in Pain?  No/denies            ADULT SLP TREATMENT - 09/20/19 0811      General Information   Behavior/Cognition  Alert;Cooperative;Pleasant mood      Cognitive-Linquistic Treatment   Treatment focused on  Voice;Patient/family/caregiver education    Skilled Treatment  Pt reports feels the need to clear throat throughout session due to pollen. Pt reports completing SOVTE and flow phonation HEP regularly. She is completing abdominal breathing practice at home too. SOVTE and flow phonation HEPs with rare min A for procedure - however with phrases/sentences (i.e., "zebras-zoo", etc) pt req'd usual SLP cues for abdominl breathing. Pt able to achieve clear phonation at phrase and sentence level uisng abdominal breathing and  flow phonation. With straw in water task using pitch glides, SLP cues with lower frequencies to use incr'd abdominal musculature were helpful for pt to generate WNL voicing in that frequency range. Between 25% of tasks pt produced WNL voice qulity. She cleared her throat 3x this sessions, she used forceful pant more like cough and SLP shaped this into a breath. which pt used successfully 2/3 in remaining opportunities. In middle of session pt stated, "I never knew coming to rehab could be so much fun! I love to come here and see you and Mickel Baas."       Assessment / Recommendations / Lucas with current plan of care      Progression Toward Goals   Progression toward goals  Progressing toward goals       SLP Education - 09/20/19 0911    Education Details  abdominal breathing, abdominal push for lower Hz in pitch glides using straw-water    Person(s) Educated  Patient    Methods  Explanation;Demonstration;Verbal cues    Comprehension  Verbalized understanding;Returned demonstration;Verbal cues required;Need further instruction       SLP Short Term Goals - 09/20/19 0915      SLP SHORT TERM GOAL #1   Title  pt will perform resonant voice tasks with occasional min A in 3 sessions    Time  3    Period  Weeks    Status  On-going      SLP SHORT TERM GOAL #2   Title  pt will perform 2 aspects of vocal hygiene between 3 sessions    Time  3    Period  Weeks    Status  On-going      SLP SHORT TERM GOAL #3   Title  pt will perform abdominal breathing (AB) in 18/20 sentences in 3 sessions    Baseline  09-20-19    Time  3    Period  Weeks    Status  On-going      SLP SHORT TERM GOAL #4   Title  pt will clear throat no more than 2 times in 3 sessions    Time  3    Period  Weeks    Status  On-going       SLP Long Term Goals - 09/20/19 0920      SLP LONG TERM GOAL #1   Title  pt will achieve WNL voicing in 10 minutes simple-mod complex conversation using compensations in 4 sessions    Time  7    Period  Weeks   or 17 visits, for all LTGs   Status  On-going      SLP LONG TERM GOAL #2   Title  pt will complete resonant voice tasks with rare min A in 3 sessions    Time  7    Period  Weeks    Status  On-going      SLP LONG TERM GOAL #3   Title  pt will drink average > 50oz H2O/day between 4 sessions    Time  7    Period  Weeks    Status  On-going      SLP LONG TERM GOAL #4   Title  pt will use AB in 10 minutes simple-mod complex language in 3 sessions    Time  7    Period  Weeks    Status  On-going       SLP LONG TERM GOAL #5   Title  pt will use compensations for throat clearing resulting in 0 throat clears in 3 sessions    Time  7    Period  Weeks    Status  On-going       Plan - 09/20/19 0915    Clinical Impression Statement  Ongoing training and education for 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 Pt continues to clear her throat excessively, requires consistent cues to use throat clear alternatives. Ongoing training for vocal hygiene, abdominal breathing, SOVTE, and flow phonation. Pt highly motivated and stated today she enjoys coming to [facility]. Continue skilled ST to maximize voice for intelligiblity and QOL.    Speech Therapy Frequency  2x / week    Duration  --   8 weeks or 17 visits   Treatment/Interventions  Functional tasks;Compensatory techniques;SLP instruction and feedback;Cueing hierarchy;Patient/family education;Internal/external aids;Other (comment)    Potential to Achieve Goals  Good    Potential Considerations  Severity of impairments       Patient will benefit from skilled therapeutic intervention in order to improve the following deficits and impairments:   Hoarseness  Other voice and resonance disorders    Problem List Patient Active Problem List   Diagnosis Date Noted  . Hoarseness   . Constipation   . Other dysphagia   . 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  . Insomnia 03/15/2019  . Abdominal pain, epigastric 05/09/2016  . Belching 05/09/2016  . Hyperlipidemia 06/18/2014  . Hyperglycemia 06/18/2014  . Gout 06/18/2014  . Osteopenia 10/28/2013  . Right foot pain 01/31/2013  . Hypertension 03/29/2011  . High cholesterol 03/29/2011    Lashanti Chambless ,MS, CCC-SLP  09/20/2019, 9:20 AM  [facility] [address] [place], [place], [address] Phone: [phone]   Fax:  [phone]   Name: ADRIENNA SOELBERG MRN: [id number] Date of Birth: 1946/04/08

## 2019-09-23 ENCOUNTER — Other Ambulatory Visit: Payer: Self-pay

## 2019-09-23 ENCOUNTER — Ambulatory Visit: Payer: Medicare Other

## 2019-09-23 DIAGNOSIS — R498 Other voice and resonance disorders: Secondary | ICD-10-CM | POA: Diagnosis not present

## 2019-09-23 DIAGNOSIS — R490 Dysphonia: Secondary | ICD-10-CM

## 2019-09-23 NOTE — Patient Instructions (Signed)
  Keep on performing your exercises like we have shown you!

## 2019-09-23 NOTE — Therapy (Signed)
[facility] [address] [place], [place], [address] Phone: [phone]   Fax:  [phone]  Speech Language Pathology Treatment  Patient Details  Name: Terri Douglas MRN: [id number] Date of Birth: [birth year] Referring Provider (SLP): Helayne Seminole., MD   Encounter Date: 09/23/2019  End of Session - 09/23/19 1732    Visit Number  6    Number of Visits  17    Date for SLP Re-Evaluation  11/21/19    SLP Start Time  57    SLP Stop Time   1100    SLP Time Calculation (min)  40 min    Activity Tolerance  Patient tolerated treatment well       Past Medical History:  Diagnosis Date  . Arthritis   . Complication of anesthesia   . Gall stones   . GERD (gastroesophageal reflux disease)   . History of cardiac catheterization 2005   Minor elevation in cardiac enzymes however no significant obstructive CAD  . Hyperlipidemia   . Hypertension   . Insomnia   . PONV (postoperative nausea and vomiting)   . Prediabetes     Past Surgical History:  Procedure Laterality Date  . A-FLUTTER ABLATION N/A 08/01/2017   Procedure: A-FLUTTER ABLATION;  Surgeon: Thompson Grayer, MD;  Location: [facility];  Service: Cardiovascular;  Laterality: N/A;  . ABDOMINAL HYSTERECTOMY    . BACK SURGERY  1992  . CARDIAC CATHETERIZATION     BACK IN 2004  SHE THINKS IT CAME BACK 'NORMAL'  . CHOLECYSTECTOMY N/A 06/24/2015   Procedure: LAPAROSCOPIC CHOLECYSTECTOMY;  Surgeon: Mickeal Skinner, MD;  Location: [facility];  Service: General;  Laterality: N/A;  . COLONOSCOPY N/A 08/30/2012   Procedure: COLONOSCOPY;  Surgeon: Rogene Houston, MD;  [facility];  Service: Endoscopy;  Laterality: N/A;  830  . DILATION AND CURETTAGE OF UTERUS    . ESOPHAGOGASTRODUODENOSCOPY  04/15/2011   Procedure: ESOPHAGOGASTRODUODENOSCOPY (EGD);  Surgeon: Rogene Houston, MD;  [facility];  Service: Endoscopy;  Laterality: N/A;  11:30  .  ESOPHAGOGASTRODUODENOSCOPY N/A 05/27/2016   Procedure: ESOPHAGOGASTRODUODENOSCOPY (EGD);  Surgeon: Rogene Houston, MD;  [facility];  Service: Endoscopy;  Laterality: N/A;  11:15  . EYE SURGERY     cataract removal  . NECK SURGERY  12/13/2018  . REVERSE SHOULDER ARTHROPLASTY Left 04/03/2019   Procedure: REVERSE SHOULDER ARTHROPLASTY;  Surgeon: Hiram Gash, MD;  [facility];  Service: Orthopedics;  Laterality: Left;  Marland Kitchen VAGINAL HYSTERECTOMY      There were no vitals filed for this visit.  Subjective Assessment - 09/23/19 1028    Subjective  Pt arrives with pressed voice today.    Currently in Pain?  No/denies            ADULT SLP TREATMENT - 09/23/19 1029      General Information   Behavior/Cognition  Alert;Cooperative;Pleasant mood      Cognitive-Linquistic Treatment   Treatment focused on  Voice;Patient/family/caregiver education    Skilled Treatment  Pt asked what sentences she has been practicing ; Pt told her 4 from memory - First two produeced with moderately pressed MTD voicing, and last two with approx 80% WNL voicing. SOVTE tasks completed with good success but did transferred very little to WNL voicing today in 2-4 word phrases. Flow phonation sentences using /z, sh, v, s/ were difficult today at first to maintain WNL voicing until SLP suggested pt place her hand  on her abdomen and feel stronger abdominal support - success improved to 75% WNL voicing. Pt knew immediately when her vocal quality declined. SLP praised pt on correct voicing using flow phonation and diminished pt's difficulty today with WNL voicing from SOVT exercises.      Assessment / Recommendations / Plan   Plan  Continue with current plan of care      Progression Toward Goals   Progression toward goals  Progressing toward goals         SLP Short Term Goals - 09/23/19 1734      SLP SHORT TERM GOAL #1   Title  pt will perform resonant voice tasks with occasional min A in 3  sessions    Time  2    Period  Weeks    Status  On-going      SLP SHORT TERM GOAL #2   Title  pt will perform 2 aspects of vocal hygiene between 3 sessions    Time  2    Period  Weeks    Status  On-going      SLP SHORT TERM GOAL #3   Title  pt will perform abdominal breathing (AB) in 18/20 sentences in 3 sessions    Baseline  09-20-19    Time  2    Period  Weeks    Status  On-going      SLP SHORT TERM GOAL #4   Title  pt will clear throat no more than 2 times in 3 sessions    Time  2    Period  Weeks    Status  On-going       SLP Long Term Goals - 09/23/19 1734      SLP LONG TERM GOAL #1   Title  pt will achieve WNL voicing in 10 minutes simple-mod complex conversation using compensations in 4 sessions    Time  6    Period  Weeks   or 17 visits, for all LTGs   Status  On-going      SLP LONG TERM GOAL #2   Title  pt will complete resonant voice tasks with rare min A in 3 sessions    Time  6    Period  Weeks    Status  On-going      SLP LONG TERM GOAL #3   Title  pt will drink average > 50oz H2O/day between 4 sessions    Time  6    Period  Weeks    Status  On-going      SLP LONG TERM GOAL #4   Title  pt will use AB in 10 minutes simple-mod complex language in 3 sessions    Time  6    Period  Weeks    Status  On-going      SLP LONG TERM GOAL #5   Title  pt will use compensations for throat clearing resulting in 0 throat clears in 3 sessions    Time  6    Period  Weeks    Status  On-going       Plan - 09/23/19 1733    Clinical Impression Statement  Ongoing training and education for 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 Ongoing training for vocal hygiene, abdominal breathing, SOVTE, and flow phonation. Pt remains highly motivated and stated again today she enjoys coming to [facility]. Continue skilled ST to maximize voice for intelligiblity and QOL.    Speech Therapy Frequency  2x / week  Duration  --   8 weeks or 17 visits    Treatment/Interventions  Functional tasks;Compensatory techniques;SLP instruction and feedback;Cueing hierarchy;Patient/family education;Internal/external aids;Other (comment)    Potential to Achieve Goals  Good    Potential Considerations  Severity of impairments       Patient will benefit from skilled therapeutic intervention in order to improve the following deficits and impairments:   Hoarseness    Problem List Patient Active Problem List   Diagnosis Date Noted  . Hoarseness   . Constipation   . Other dysphagia   . 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  . Insomnia 03/15/2019  . Abdominal pain, epigastric 05/09/2016  . Belching 05/09/2016  . Hyperlipidemia 06/18/2014  . Hyperglycemia 06/18/2014  . Gout 06/18/2014  . Osteopenia 10/28/2013  . Right foot pain 01/31/2013  . Hypertension 03/29/2011  . High cholesterol 03/29/2011    Mayanna Garlitz ,MS, CCC-SLP  09/23/2019, 5:35 PM  [facility] [address] [place], [place], [address] Phone: [phone]   Fax:  [phone]   Name: Terri Douglas MRN: [id number] Date of Birth: Nov 04, 1945

## 2019-09-24 ENCOUNTER — Other Ambulatory Visit: Payer: Self-pay | Admitting: Family Medicine

## 2019-09-24 NOTE — Telephone Encounter (Signed)
May have 90-day refill please pend patient does need to do follow-up visit by June

## 2019-09-24 NOTE — Telephone Encounter (Signed)
Surgical clearance on 03/15/19 was last visit

## 2019-09-25 ENCOUNTER — Encounter: Payer: Self-pay | Admitting: Speech Pathology

## 2019-09-25 ENCOUNTER — Ambulatory Visit: Payer: Medicare Other | Admitting: Speech Pathology

## 2019-09-25 ENCOUNTER — Other Ambulatory Visit: Payer: Self-pay

## 2019-09-25 DIAGNOSIS — R498 Other voice and resonance disorders: Secondary | ICD-10-CM

## 2019-09-25 DIAGNOSIS — R490 Dysphonia: Secondary | ICD-10-CM | POA: Diagnosis not present

## 2019-09-25 NOTE — Therapy (Signed)
[facility] [address] [place], [place], [address] Phone: [phone]   Fax:  [phone]  Speech Language Pathology Treatment  Patient Details  Name: Terri Douglas MRN: [id number] Date of Birth: Jun 20, 1945 Referring Provider (SLP): Helayne Seminole., MD   Encounter Date: 09/25/2019  End of Session - 09/25/19 1238    Visit Number  7    Number of Visits  17    Date for SLP Re-Evaluation  11/21/19    SLP Start Time  1017    SLP Stop Time   1057    SLP Time Calculation (min)  40 min    Activity Tolerance  Patient tolerated treatment well       Past Medical History:  Diagnosis Date  . Arthritis   . Complication of anesthesia   . Gall stones   . GERD (gastroesophageal reflux disease)   . History of cardiac catheterization 2005   Minor elevation in cardiac enzymes however no significant obstructive CAD  . Hyperlipidemia   . Hypertension   . Insomnia   . PONV (postoperative nausea and vomiting)   . Prediabetes     Past Surgical History:  Procedure Laterality Date  . A-FLUTTER ABLATION N/A 08/01/2017   Procedure: A-FLUTTER ABLATION;  Surgeon: Thompson Grayer, MD;  Location: [facility];  Service: Cardiovascular;  Laterality: N/A;  . ABDOMINAL HYSTERECTOMY    . BACK SURGERY  1992  . CARDIAC CATHETERIZATION     BACK IN 2004  SHE THINKS IT CAME BACK 'NORMAL'  . CHOLECYSTECTOMY N/A 06/24/2015   Procedure: LAPAROSCOPIC CHOLECYSTECTOMY;  Surgeon: Mickeal Skinner, MD;  Location: [facility];  Service: General;  Laterality: N/A;  . COLONOSCOPY N/A 08/30/2012   Procedure: COLONOSCOPY;  Surgeon: Rogene Houston, MD;  [facility];  Service: Endoscopy;  Laterality: N/A;  830  . DILATION AND CURETTAGE OF UTERUS    . ESOPHAGOGASTRODUODENOSCOPY  04/15/2011   Procedure: ESOPHAGOGASTRODUODENOSCOPY (EGD);  Surgeon: Rogene Houston, MD;  [facility];  Service: Endoscopy;  Laterality: N/A;  11:30  .  ESOPHAGOGASTRODUODENOSCOPY N/A 05/27/2016   Procedure: ESOPHAGOGASTRODUODENOSCOPY (EGD);  Surgeon: Rogene Houston, MD;  [facility];  Service: Endoscopy;  Laterality: N/A;  11:15  . EYE SURGERY     cataract removal  . NECK SURGERY  12/13/2018  . REVERSE SHOULDER ARTHROPLASTY Left 04/03/2019   Procedure: REVERSE SHOULDER ARTHROPLASTY;  Surgeon: Hiram Gash, MD;  [facility];  Service: Orthopedics;  Laterality: Left;  Marland Kitchen VAGINAL HYSTERECTOMY      There were no vitals filed for this visit.  Subjective Assessment - 09/25/19 1228    Subjective  "I look forward to coming here"    Currently in Pain?  No/denies            ADULT SLP TREATMENT - 09/25/19 1021      General Information   Behavior/Cognition  Alert;Cooperative;Pleasant mood      Treatment Provided   Treatment provided  Cognitive-Linquistic      Cognitive-Linquistic Treatment   Treatment focused on  Voice;Patient/family/caregiver education    Skilled Treatment  Targeted resonant phonation and flow phonation with nasal syllables to achieve clear phonation  at sentence level. Terri Douglas conitnues to benefit from frequent min cues to use abdominal breath to support flow phonation. In structured task, she required usual min to mod A to use breath support to correct pressed/hoarse speech. Hand on abdomen and cues to breath more frequently for optimal  voicing.      Assessment / Recommendations / Plan   Plan  Continue with current plan of care      Progression Toward Goals   Progression toward goals  Progressing toward goals       SLP Education - 09/25/19 1235    Education Details  abdominal breathing more frequently during speech    Person(s) Educated  Patient    Methods  Explanation;Demonstration;Verbal cues    Comprehension  Verbalized understanding;Returned demonstration;Verbal cues required;Need further instruction       SLP Short Term Goals - 09/25/19 1236      SLP SHORT TERM GOAL #1   Title  pt  will perform resonant voice tasks with occasional min A in 3 sessions    Time  2    Period  Weeks    Status  On-going      SLP SHORT TERM GOAL #2   Title  pt will perform 2 aspects of vocal hygiene between 3 sessions    Time  2    Period  Weeks    Status  On-going      SLP SHORT TERM GOAL #3   Title  pt will perform abdominal breathing (AB) in 18/20 sentences in 3 sessions    Baseline  09-20-19    Time  2    Period  Weeks    Status  On-going      SLP SHORT TERM GOAL #4   Title  pt will clear throat no more than 2 times in 3 sessions    Time  2    Period  Weeks    Status  On-going       SLP Long Term Goals - 09/25/19 1237      SLP LONG TERM GOAL #1   Title  pt will achieve WNL voicing in 10 minutes simple-mod complex conversation using compensations in 4 sessions    Time  6    Period  Weeks   or 17 visits, for all LTGs   Status  On-going      SLP LONG TERM GOAL #2   Title  pt will complete resonant voice tasks with rare min A in 3 sessions    Time  6    Period  Weeks    Status  On-going      SLP LONG TERM GOAL #3   Title  pt will drink average > 50oz H2O/day between 4 sessions    Time  6    Period  Weeks    Status  On-going      SLP LONG TERM GOAL #4   Title  pt will use AB in 10 minutes simple-mod complex language in 3 sessions    Time  6    Period  Weeks    Status  On-going      SLP LONG TERM GOAL #5   Title  pt will use compensations for throat clearing resulting in 0 throat clears in 3 sessions    Time  6    Period  Weeks    Status  On-going       Plan - 09/25/19 1236    Clinical Impression Statement  Ongoing training and education for 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 Ongoing training for vocal hygiene, abdominal breathing, SOVTE, and flow phonation. Pt remains highly motivated and stated again today she enjoys coming to [facility]. Continue skilled ST to maximize voice for intelligiblity and QOL.    Speech Therapy Frequency  2x / week     Treatment/Interventions  Functional tasks;Compensatory techniques;SLP instruction and feedback;Cueing hierarchy;Patient/family education;Internal/external aids;Other (comment)    Potential to Achieve Goals  Good    Potential Considerations  Severity of impairments       Patient will benefit from skilled therapeutic intervention in order to improve the following deficits and impairments:   Other voice and resonance disorders    Problem List Patient Active Problem List   Diagnosis Date Noted  . Hoarseness   . Constipation   . Other dysphagia   . 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  . Insomnia 03/15/2019  . Abdominal pain, epigastric 05/09/2016  . Belching 05/09/2016  . Hyperlipidemia 06/18/2014  . Hyperglycemia 06/18/2014  . Gout 06/18/2014  . Osteopenia 10/28/2013  . Right foot pain 01/31/2013  . Hypertension 03/29/2011  . High cholesterol 03/29/2011    Vedika Dumlao, Annye Rusk MS, CCC-SLP 09/25/2019, 12:39 PM  [facility] [address] [place], [place], [address] Phone: [phone]   Fax:  [phone]   Name: Terri Douglas MRN: [id number] Date of Birth: 1945-06-29

## 2019-09-25 NOTE — Telephone Encounter (Signed)
Pt scheduled 6/15.

## 2019-09-25 NOTE — Patient Instructions (Signed)
   Add to your flow sentences:  Mmy mmamma mmakess mme mmuffffinnss  Nno onne knnowss Nnormmann'ss Nnamme (it's ok to breathe in between words)  SSee Ssally Ssleep SSoundly by ththe SSea  Teachers eat ripe peaches at the [employer] should polish your new shoes  Keep up the exercises with the bubbles, straw and gargle  You can mix up your practice with all of the syllables, sentences you have  Read aloud focus on frequent breathing and flow voice

## 2019-10-11 ENCOUNTER — Ambulatory Visit: Payer: Medicare Other

## 2019-10-14 ENCOUNTER — Ambulatory Visit: Payer: Medicare Other | Attending: Otolaryngology

## 2019-10-14 ENCOUNTER — Other Ambulatory Visit: Payer: Self-pay

## 2019-10-14 DIAGNOSIS — R490 Dysphonia: Secondary | ICD-10-CM | POA: Diagnosis not present

## 2019-10-14 DIAGNOSIS — Z1231 Encounter for screening mammogram for malignant neoplasm of breast: Secondary | ICD-10-CM | POA: Diagnosis not present

## 2019-10-14 DIAGNOSIS — R498 Other voice and resonance disorders: Secondary | ICD-10-CM | POA: Insufficient documentation

## 2019-10-14 NOTE — Therapy (Signed)
[facility] [address] [place], [place], [address] Phone: [phone]   Fax:  [phone]  Speech Language Pathology Treatment  Patient Details  Name: Terri Douglas MRN: [id number] Date of Birth: [birth year] Referring Provider (SLP): Helayne Seminole., MD   Encounter Date: 10/14/2019  End of Session - 10/14/19 1101    Visit Number  8    Number of Visits  17    Date for SLP Re-Evaluation  11/21/19    SLP Start Time  81    SLP Stop Time   1100    SLP Time Calculation (min)  41 min    Activity Tolerance  Patient tolerated treatment well       Past Medical History:  Diagnosis Date  . Arthritis   . Complication of anesthesia   . Gall stones   . GERD (gastroesophageal reflux disease)   . History of cardiac catheterization 2005   Minor elevation in cardiac enzymes however no significant obstructive CAD  . Hyperlipidemia   . Hypertension   . Insomnia   . PONV (postoperative nausea and vomiting)   . Prediabetes     Past Surgical History:  Procedure Laterality Date  . A-FLUTTER ABLATION N/A 08/01/2017   Procedure: A-FLUTTER ABLATION;  Surgeon: Thompson Grayer, MD;  Location: [facility];  Service: Cardiovascular;  Laterality: N/A;  . ABDOMINAL HYSTERECTOMY    . BACK SURGERY  1992  . CARDIAC CATHETERIZATION     BACK IN 2004  SHE THINKS IT CAME BACK 'NORMAL'  . CHOLECYSTECTOMY N/A 06/24/2015   Procedure: LAPAROSCOPIC CHOLECYSTECTOMY;  Surgeon: Mickeal Skinner, MD;  Location: [facility];  Service: General;  Laterality: N/A;  . COLONOSCOPY N/A 08/30/2012   Procedure: COLONOSCOPY;  Surgeon: Rogene Houston, MD;  [facility];  Service: Endoscopy;  Laterality: N/A;  830  . DILATION AND CURETTAGE OF UTERUS    . ESOPHAGOGASTRODUODENOSCOPY  04/15/2011   Procedure: ESOPHAGOGASTRODUODENOSCOPY (EGD);  Surgeon: Rogene Houston, MD;  [facility];  Service: Endoscopy;  Laterality: N/A;  11:30  .  ESOPHAGOGASTRODUODENOSCOPY N/A 05/27/2016   Procedure: ESOPHAGOGASTRODUODENOSCOPY (EGD);  Surgeon: Rogene Houston, MD;  [facility];  Service: Endoscopy;  Laterality: N/A;  11:15  . EYE SURGERY     cataract removal  . NECK SURGERY  12/13/2018  . REVERSE SHOULDER ARTHROPLASTY Left 04/03/2019   Procedure: REVERSE SHOULDER ARTHROPLASTY;  Surgeon: Hiram Gash, MD;  [facility];  Service: Orthopedics;  Laterality: Left;  Marland Kitchen VAGINAL HYSTERECTOMY      There were no vitals filed for this visit.  Subjective Assessment - 10/14/19 1027    Subjective  "I was on vacation."    Currently in Pain?  No/denies            ADULT SLP TREATMENT - 10/14/19 1041      General Information   Behavior/Cognition  Alert;Cooperative;Pleasant mood      Treatment Provided   Treatment provided  Cognitive-Linquistic      Cognitive-Linquistic Treatment   Treatment focused on  Voice;Patient/family/caregiver education    Skilled Treatment  SLP worked with pt on resonant phonation and flow phonation using syllables with nasals, at the sentence level first in imiatation and then spontaneously. Pt self corrected productions x6 throughout the session today when using "gravelly voice", so pt is beginning to tell the difference between "gravelly" and WNL voicing. When self correcting pt successful 50% of the time in improving vocal quality. Vaughan Basta  conitnues to benefit from frequent min cues to use abdominal breath to support flow phonation. In structured tasks imitating 4-5 word sentences using "who" as the initial word to foster smoother voice, she again required usual min to mod A to use breath support to correct pressed/hoarse speech. Hand on abdomen was again helpful.       Assessment / Recommendations / Plan   Plan  Continue with current plan of care      Progression Toward Goals   Progression toward goals  Progressing toward goals         SLP Short Term Goals - 10/14/19 1101      SLP SHORT  TERM GOAL #1   Title  pt will perform resonant voice tasks with occasional min A in 3 sessions    Time  1    Period  Weeks    Status  On-going      SLP SHORT TERM GOAL #2   Title  pt will perform 2 aspects of vocal hygiene between 3 sessions    Time  1    Period  Weeks    Status  On-going      SLP SHORT TERM GOAL #3   Title  pt will perform abdominal breathing (AB) in 18/20 sentences in 3 sessions    Baseline  09-20-19    Time  1    Period  Weeks    Status  On-going      SLP SHORT TERM GOAL #4   Title  pt will clear throat no more than 2 times in 3 sessions    Time  1    Period  Weeks    Status  On-going       SLP Long Term Goals - 10/14/19 1102      SLP LONG TERM GOAL #1   Title  pt will achieve WNL voicing in 10 minutes simple-mod complex conversation using compensations in 4 sessions    Time  5    Period  Weeks   or 17 visits, for all LTGs   Status  On-going      SLP LONG TERM GOAL #2   Title  pt will complete resonant voice tasks with rare min A in 3 sessions    Time  5    Period  Weeks    Status  On-going      SLP LONG TERM GOAL #3   Title  pt will drink average > 50oz H2O/day between 4 sessions    Time  5    Period  Weeks    Status  On-going      SLP LONG TERM GOAL #4   Title  pt will use AB in 10 minutes simple-mod complex language in 3 sessions    Time  5    Period  Weeks    Status  On-going      SLP LONG TERM GOAL #5   Title  pt will use compensations for throat clearing resulting in 0 throat clears in 3 sessions    Time  5    Period  Weeks    Status  On-going       Plan - 10/14/19 1101    Clinical Impression Statement  Ongoing training and education for 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 Ongoing training for vocal hygiene, abdominal breathing, SOVTE, and flow phonation. Pt remains highly motivated and stated again today she enjoys coming to [place]. Continue skilled ST to maximize voice for intelligiblity and QOL.  Speech Therapy  Frequency  2x / week    Treatment/Interventions  Functional tasks;Compensatory techniques;SLP instruction and feedback;Cueing hierarchy;Patient/family education;Internal/external aids;Other (comment)    Potential to Achieve Goals  Good    Potential Considerations  Severity of impairments       Patient will benefit from skilled therapeutic intervention in order to improve the following deficits and impairments:   Hoarseness  Other voice and resonance disorders    Problem List Patient Active Problem List   Diagnosis Date Noted  . Hoarseness   . Constipation   . Other dysphagia   . 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  . Insomnia 03/15/2019  . Abdominal pain, epigastric 05/09/2016  . Belching 05/09/2016  . Hyperlipidemia 06/18/2014  . Hyperglycemia 06/18/2014  . Gout 06/18/2014  . Osteopenia 10/28/2013  . Right foot pain 01/31/2013  . Hypertension 03/29/2011  . High cholesterol 03/29/2011    Onaje Warne ,[place], CCC-SLP  10/14/2019, 11:24 PM  [facility] [address] [place], [place], [address] Phone: [phone]   Fax:  [phone]   Name: YUZUKI SPLAIN MRN: [id number] Date of Birth: Oct 21, 1945

## 2019-10-16 ENCOUNTER — Encounter: Payer: Self-pay | Admitting: Speech Pathology

## 2019-10-16 ENCOUNTER — Other Ambulatory Visit: Payer: Self-pay

## 2019-10-16 ENCOUNTER — Ambulatory Visit: Payer: Medicare Other | Admitting: Speech Pathology

## 2019-10-16 DIAGNOSIS — R498 Other voice and resonance disorders: Secondary | ICD-10-CM | POA: Diagnosis not present

## 2019-10-16 DIAGNOSIS — R490 Dysphonia: Secondary | ICD-10-CM | POA: Diagnosis not present

## 2019-10-16 NOTE — Patient Instructions (Signed)
   What are you going to do today?  Where are you going today?  Have you talked to your daughter today?  Have you called to check on this?  What should we have for lunch?  What is the weather today?  Myrle Sheng plays every instruments and sings  Claiborne Billings has a beautiful voice  They play tennis  Practice flow in some conversations with your son, Simona Huh. Let him know that you are working on your voice and need to take pauses to breathe and explain flow phonation vs pressed phonation. Tell him you need extra time to talk to focus on breathing and flow voice, he should not interrupt you. See if he can hear the difference when you use flow vs pressed voice  Try to practice flow with Ulice Dash as well  Practice flow in things you have memorized, such as rhymes

## 2019-10-16 NOTE — Therapy (Signed)
[facility] [address] [place], [place], [address] Phone: [phone]   Fax:  [phone]  Speech Language Pathology Treatment  Patient Details  Name: Terri Douglas MRN: [id number] Date of Birth: Sep 16, 1945 Referring Provider (SLP): Helayne Seminole., MD   Encounter Date: 10/16/2019  End of Session - 10/16/19 1146    Visit Number  9    Number of Visits  17    Date for SLP Re-Evaluation  11/21/19    SLP Start Time  1017    SLP Stop Time   1059    SLP Time Calculation (min)  42 min    Activity Tolerance  Patient tolerated treatment well       Past Medical History:  Diagnosis Date  . Arthritis   . Complication of anesthesia   . Gall stones   . GERD (gastroesophageal reflux disease)   . History of cardiac catheterization 2005   Minor elevation in cardiac enzymes however no significant obstructive CAD  . Hyperlipidemia   . Hypertension   . Insomnia   . PONV (postoperative nausea and vomiting)   . Prediabetes     Past Surgical History:  Procedure Laterality Date  . A-FLUTTER ABLATION N/A 08/01/2017   Procedure: A-FLUTTER ABLATION;  Surgeon: Thompson Grayer, MD;  Location: [facility];  Service: Cardiovascular;  Laterality: N/A;  . ABDOMINAL HYSTERECTOMY    . BACK SURGERY  1992  . CARDIAC CATHETERIZATION     BACK IN 2004  SHE THINKS IT CAME BACK 'NORMAL'  . CHOLECYSTECTOMY N/A 06/24/2015   Procedure: LAPAROSCOPIC CHOLECYSTECTOMY;  Surgeon: Mickeal Skinner, MD;  Location: [facility];  Service: General;  Laterality: N/A;  . COLONOSCOPY N/A 08/30/2012   Procedure: COLONOSCOPY;  Surgeon: Rogene Houston, MD;  [facility];  Service: Endoscopy;  Laterality: N/A;  830  . DILATION AND CURETTAGE OF UTERUS    . ESOPHAGOGASTRODUODENOSCOPY  04/15/2011   Procedure: ESOPHAGOGASTRODUODENOSCOPY (EGD);  Surgeon: Rogene Houston, MD;  [facility];  Service: Endoscopy;  Laterality: N/A;  11:30  .  ESOPHAGOGASTRODUODENOSCOPY N/A 05/27/2016   Procedure: ESOPHAGOGASTRODUODENOSCOPY (EGD);  Surgeon: Rogene Houston, MD;  [facility];  Service: Endoscopy;  Laterality: N/A;  11:15  . EYE SURGERY     cataract removal  . NECK SURGERY  12/13/2018  . REVERSE SHOULDER ARTHROPLASTY Left 04/03/2019   Procedure: REVERSE SHOULDER ARTHROPLASTY;  Surgeon: Hiram Gash, MD;  [facility];  Service: Orthopedics;  Laterality: Left;  Marland Kitchen VAGINAL HYSTERECTOMY      There were no vitals filed for this visit.  Subjective Assessment - 10/16/19 1139    Subjective  "I had Carl on Monday"    Currently in Pain?  No/denies            ADULT SLP TREATMENT - 10/16/19 1031      General Information   Behavior/Cognition  Alert;Cooperative;Pleasant mood      Treatment Provided   Treatment provided  Cognitive-Linquistic      Cognitive-Linquistic Treatment   Treatment focused on  Voice;Patient/family/caregiver education    Skilled Treatment  Targeted flow and resonant phonation in syllables, word and sentences with occsional min A for breath support and to cease pressed speech. Advanced to simple conversation with usual min modelling and visual cues for breath support. Instructed Terri Douglas to practice at home with her spouse, Terri Douglas when possible. We generated some sentences she typically says to [place] in a day for home carryover.  Instructed Terri Douglas to educate her son with whom she frequently talks on the phone with, re: her strategies for clear phonation and the difference between pressed and flow phonation.  2 gentle throat clears - much improved, and reports increasing water intake at home to reduce throat clears      Assessment / Recommendations / Plan   Plan  Continue with current plan of care      Progression Toward Goals   Progression toward goals  Progressing toward goals       SLP Education - 10/16/19 1143    Education Details  advance home practice to short conversations with family     Person(s) Educated  Patient    Methods  Explanation;Demonstration;Verbal cues;Handout    Comprehension  Verbalized understanding;Returned demonstration;Verbal cues required;Need further instruction       SLP Short Term Goals - 10/16/19 1144      SLP SHORT TERM GOAL #1   Title  pt will perform resonant voice tasks with occasional min A in 3 sessions    Time  1    Period  Weeks    Status  Achieved      SLP SHORT TERM GOAL #2   Title  pt will perform 2 aspects of vocal hygiene between 3 sessions    Time  1    Period  Weeks    Status  Achieved      SLP SHORT TERM GOAL #3   Title  pt will perform abdominal breathing (AB) in 18/20 sentences in 3 sessions    Baseline  09-20-19    Time  1    Period  Weeks    Status  Achieved      SLP SHORT TERM GOAL #4   Title  pt will clear throat no more than 2 times in 3 sessions    Time  1    Period  Weeks    Status  Achieved       SLP Long Term Goals - 10/16/19 1145      SLP LONG TERM GOAL #1   Title  pt will achieve WNL voicing in 10 minutes simple-mod complex conversation using compensations in 4 sessions    Time  4    Period  Weeks   or 17 visits, for all LTGs   Status  On-going      SLP LONG TERM GOAL #2   Title  pt will complete resonant voice tasks with rare min A in 3 sessions    Time  4    Period  Weeks    Status  On-going      SLP LONG TERM GOAL #3   Title  pt will drink average > 50oz H2O/day between 4 sessions    Time  4    Period  Weeks    Status  On-going      SLP LONG TERM GOAL #4   Title  pt will use AB in 10 minutes simple-mod complex language in 3 sessions    Time  5    Period  Weeks    Status  On-going      SLP LONG TERM GOAL #5   Title  pt will use compensations for throat clearing resulting in 0 throat clears in 3 sessions    Time  4    Period  Weeks    Status  On-going       Plan - 10/16/19 1143    Clinical Impression Statement  Ongoing training and education  for 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 Ongoing training for vocal hygiene, abdominal breathing, SOVTE, and flow phonation. Pt remains highly motivated and stated again today she enjoys coming to [facility]. Continue skilled ST to maximize voice for intelligiblity and QOL.    Speech Therapy Frequency  2x / week    Duration  --   8 weeks or 17 visits   Treatment/Interventions  Functional tasks;Compensatory techniques;SLP instruction and feedback;Cueing hierarchy;Patient/family education;Internal/external aids;Other (comment)    Potential to Achieve Goals  Good       Patient will benefit from skilled therapeutic intervention in order to improve the following deficits and impairments:   Other voice and resonance disorders    Problem List Patient Active Problem List   Diagnosis Date Noted  . Hoarseness   . Constipation   . Other dysphagia   . 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  . Insomnia 03/15/2019  . Abdominal pain, epigastric 05/09/2016  . Belching 05/09/2016  . Hyperlipidemia 06/18/2014  . Hyperglycemia 06/18/2014  . Gout 06/18/2014  . Osteopenia 10/28/2013  . Right foot pain 01/31/2013  . Hypertension 03/29/2011  . High cholesterol 03/29/2011    Terri Douglas, Annye Rusk MS, CCC-SLP 10/16/2019, 11:46 AM  [facility] [address] [place], [place], [address] Phone: [phone]   Fax:  [phone]   Name: Terri Douglas MRN: [id number] Date of Birth: 05/02/1946

## 2019-10-21 ENCOUNTER — Ambulatory Visit: Payer: Medicare Other

## 2019-10-21 ENCOUNTER — Other Ambulatory Visit: Payer: Self-pay

## 2019-10-21 DIAGNOSIS — R498 Other voice and resonance disorders: Secondary | ICD-10-CM | POA: Diagnosis not present

## 2019-10-21 DIAGNOSIS — R490 Dysphonia: Secondary | ICD-10-CM

## 2019-10-21 NOTE — Therapy (Signed)
[facility] [address] [place], [place], [address] Phone: [phone]   Fax:  [phone]  Speech Language Pathology Treatment  Patient Details  Name: Terri Douglas MRN: [id number] Date of Birth: Mar 15, 1946 Referring Provider (SLP): Helayne Seminole., MD   Encounter Date: 10/21/2019  End of Session - 10/21/19 1552    Visit Number  10    Number of Visits  17    Date for SLP Re-Evaluation  11/21/19    SLP Start Time  98    SLP Stop Time   1100    SLP Time Calculation (min)  41 min    Activity Tolerance  Patient tolerated treatment well       Past Medical History:  Diagnosis Date  . Arthritis   . Complication of anesthesia   . Gall stones   . GERD (gastroesophageal reflux disease)   . History of cardiac catheterization 2005   Minor elevation in cardiac enzymes however no significant obstructive CAD  . Hyperlipidemia   . Hypertension   . Insomnia   . PONV (postoperative nausea and vomiting)   . Prediabetes     Past Surgical History:  Procedure Laterality Date  . A-FLUTTER ABLATION N/A 08/01/2017   Procedure: A-FLUTTER ABLATION;  Surgeon: Thompson Grayer, MD;  Location: [facility];  Service: Cardiovascular;  Laterality: N/A;  . ABDOMINAL HYSTERECTOMY    . BACK SURGERY  1992  . CARDIAC CATHETERIZATION     BACK IN 2004  SHE THINKS IT CAME BACK 'NORMAL'  . CHOLECYSTECTOMY N/A 06/24/2015   Procedure: LAPAROSCOPIC CHOLECYSTECTOMY;  Surgeon: Mickeal Skinner, MD;  Location: [facility];  Service: General;  Laterality: N/A;  . COLONOSCOPY N/A 08/30/2012   Procedure: COLONOSCOPY;  Surgeon: Rogene Houston, MD;  [facility];  Service: Endoscopy;  Laterality: N/A;  830  . DILATION AND CURETTAGE OF UTERUS    . ESOPHAGOGASTRODUODENOSCOPY  04/15/2011   Procedure: ESOPHAGOGASTRODUODENOSCOPY (EGD);  Surgeon: Rogene Houston, MD;  [facility];  Service: Endoscopy;  Laterality: N/A;  11:30  .  ESOPHAGOGASTRODUODENOSCOPY N/A 05/27/2016   Procedure: ESOPHAGOGASTRODUODENOSCOPY (EGD);  Surgeon: Rogene Houston, MD;  [facility];  Service: Endoscopy;  Laterality: N/A;  11:15  . EYE SURGERY     cataract removal  . NECK SURGERY  12/13/2018  . REVERSE SHOULDER ARTHROPLASTY Left 04/03/2019   Procedure: REVERSE SHOULDER ARTHROPLASTY;  Surgeon: Hiram Gash, MD;  [facility];  Service: Orthopedics;  Laterality: Left;  Marland Kitchen VAGINAL HYSTERECTOMY      There were no vitals filed for this visit.  Subjective Assessment - 10/21/19 1023    Subjective  Pt entered with pressed voice.    Currently in Pain?  No/denies            ADULT SLP TREATMENT - 10/21/19 1031      General Information   Behavior/Cognition  Alert;Cooperative;Pleasant mood      Treatment Provided   Treatment provided  Cognitive-Linquistic      Cognitive-Linquistic Treatment   Treatment focused on  Voice;Patient/family/caregiver education    Skilled Treatment  Five minutes of simple conversation upon entering [facility] room with mixed "gravelly"/pressed speech and WNL voice quality. SLP worked with pt after 5 minutes conversation on structured tasks using flow and resonant phonation in syllables varying in pitch, word, and sentences with usual min A for breath support and to cease pressed speech. Pt ID'd pressed speech 90% accuracy but could only improve  quality when self-correcting 50% of the time. Advanced to simple conversation with usual min modelling and visual cues for breath support. Adean  practiced at home with her spouse, Ulice Dash when possible and reports she was mostly successful at feeling a WNL voice. With her "Ulice Dash" sentences, she had 70% success with WNL voice quality. Domenique stated she attempted to practice with her son over the phone however he often has background noise and cannot hear her, or they are interrupted. In 7 minutes simple-mod complex conversation pt achieved WNL vocal quality approx 60% of the  time with ocasional min A for breath support. 2 throat clears today.       Assessment / Recommendations / Plan   Plan  Continue with current plan of care      Progression Toward Goals   Progression toward goals  Progressing toward goals         SLP Short Term Goals - 10/21/19 1553      SLP SHORT TERM GOAL #1   Title  pt will perform resonant voice tasks with occasional min A in 3 sessions    Time  1    Period  Weeks    Status  Achieved      SLP SHORT TERM GOAL #2   Title  pt will perform 2 aspects of vocal hygiene between 3 sessions    Time  1    Period  Weeks    Status  Achieved      SLP SHORT TERM GOAL #3   Title  pt will perform abdominal breathing (AB) in 18/20 sentences in 3 sessions    Baseline  09-20-19    Time  1    Period  Weeks    Status  Achieved      SLP SHORT TERM GOAL #4   Title  pt will clear throat no more than 2 times in 3 sessions    Time  1    Period  Weeks    Status  Achieved       SLP Long Term Goals - 10/21/19 1553      SLP LONG TERM GOAL #1   Title  pt will achieve WNL voicing in 10 minutes simple-mod complex conversation using compensations in 4 sessions    Time  4    Period  Weeks   or 17 visits, for all LTGs   Status  On-going      SLP LONG TERM GOAL #2   Title  pt will complete resonant voice tasks with rare min A in 3 sessions    Baseline  10-21-19    Time  4    Period  Weeks    Status  On-going      SLP LONG TERM GOAL #3   Title  pt will drink average > 50oz H2O/day between 4 sessions    Baseline  10-21-19    Time  4    Period  Weeks    Status  On-going      SLP LONG TERM GOAL #4   Title  pt will use AB in 10 minutes simple-mod complex language in 3 sessions    Time  5    Period  Weeks    Status  On-going      SLP LONG TERM GOAL #5   Title  pt will use compensations for throat clearing resulting in 0 throat clears in 3 sessions    Time  4    Period  Weeks  Status  On-going       Plan - 10/21/19 1553    Clinical  Impression Statement  Ongoing training and education for 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 Ongoing training for vocal hygiene, abdominal breathing, SOVTE, and flow phonation. Pt remains highly motivated and stated again today she enjoys coming to [facility] [place]. Continue skilled ST to maximize voice for intelligiblity and QOL.    Speech Therapy Frequency  2x / week    Duration  --   8 weeks or 17 visits   Treatment/Interventions  Functional tasks;Compensatory techniques;SLP instruction and feedback;Cueing hierarchy;Patient/family education;Internal/external aids;Other (comment)    Potential to Achieve Goals  Good       Patient will benefit from skilled therapeutic intervention in order to improve the following deficits and impairments:   Hoarseness    Problem List Patient Active Problem List   Diagnosis Date Noted  . Hoarseness   . Constipation   . Other dysphagia   . 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  . Insomnia 03/15/2019  . Abdominal pain, epigastric 05/09/2016  . Belching 05/09/2016  . Hyperlipidemia 06/18/2014  . Hyperglycemia 06/18/2014  . Gout 06/18/2014  . Osteopenia 10/28/2013  . Right foot pain 01/31/2013  . Hypertension 03/29/2011  . High cholesterol 03/29/2011    Calina Patrie ,[place], CCC-SLP  10/21/2019, 3:56 PM  [facility] [address] [place], [place], [address] Phone: [phone]   Fax:  [phone]   Name: AURORAH CARRIERO MRN: [id number] Date of Birth: 1946/02/06

## 2019-10-21 NOTE — Patient Instructions (Signed)
After discharge you will need to continue your exercises.

## 2019-10-23 ENCOUNTER — Other Ambulatory Visit: Payer: Self-pay

## 2019-10-23 ENCOUNTER — Ambulatory Visit: Payer: Medicare Other

## 2019-10-23 DIAGNOSIS — R490 Dysphonia: Secondary | ICD-10-CM

## 2019-10-23 DIAGNOSIS — R498 Other voice and resonance disorders: Secondary | ICD-10-CM | POA: Diagnosis not present

## 2019-10-23 NOTE — Therapy (Signed)
[facility] [address] [place], [place], [address] Phone: [phone]   Fax:  [phone]  Speech Language Pathology Treatment  Patient Details  Name: Terri Douglas MRN: [id number] Date of Birth: 12-18-1945 Referring Provider (SLP): Helayne Seminole., MD   Encounter Date: 10/23/2019  End of Session - 10/23/19 1141    Visit Number  11    Number of Visits  17    Date for SLP Re-Evaluation  11/21/19    SLP Start Time  62    SLP Stop Time   1100    SLP Time Calculation (min)  42 min    Activity Tolerance  Patient tolerated treatment well       Past Medical History:  Diagnosis Date  . Arthritis   . Complication of anesthesia   . Gall stones   . GERD (gastroesophageal reflux disease)   . History of cardiac catheterization 2005   Minor elevation in cardiac enzymes however no significant obstructive CAD  . Hyperlipidemia   . Hypertension   . Insomnia   . PONV (postoperative nausea and vomiting)   . Prediabetes     Past Surgical History:  Procedure Laterality Date  . A-FLUTTER ABLATION N/A 08/01/2017   Procedure: A-FLUTTER ABLATION;  Surgeon: Thompson Grayer, MD;  Location: [facility];  Service: Cardiovascular;  Laterality: N/A;  . ABDOMINAL HYSTERECTOMY    . BACK SURGERY  1992  . CARDIAC CATHETERIZATION     BACK IN 2004  SHE THINKS IT CAME BACK 'NORMAL'  . CHOLECYSTECTOMY N/A 06/24/2015   Procedure: LAPAROSCOPIC CHOLECYSTECTOMY;  Surgeon: Mickeal Skinner, MD;  Location: [facility];  Service: General;  Laterality: N/A;  . COLONOSCOPY N/A 08/30/2012   Procedure: COLONOSCOPY;  Surgeon: Rogene Houston, MD;  [facility];  Service: Endoscopy;  Laterality: N/A;  830  . DILATION AND CURETTAGE OF UTERUS    . ESOPHAGOGASTRODUODENOSCOPY  04/15/2011   Procedure: ESOPHAGOGASTRODUODENOSCOPY (EGD);  Surgeon: Rogene Houston, MD;  [facility];  Service: Endoscopy;  Laterality: N/A;  11:30  .  ESOPHAGOGASTRODUODENOSCOPY N/A 05/27/2016   Procedure: ESOPHAGOGASTRODUODENOSCOPY (EGD);  Surgeon: Rogene Houston, MD;  [facility];  Service: Endoscopy;  Laterality: N/A;  11:15  . EYE SURGERY     cataract removal  . NECK SURGERY  12/13/2018  . REVERSE SHOULDER ARTHROPLASTY Left 04/03/2019   Procedure: REVERSE SHOULDER ARTHROPLASTY;  Surgeon: Hiram Gash, MD;  Location: [facility];  Service: Orthopedics;  Laterality: Left;  Marland Kitchen VAGINAL HYSTERECTOMY      There were no vitals filed for this visit.  Subjective Assessment - 10/23/19 1032    Subjective  "I have so much fun coming here. Who would have thought therapy would be fun?"    Currently in Pain?  No/denies            ADULT SLP TREATMENT - 10/23/19 1033      General Information   Behavior/Cognition  Alert;Cooperative;Pleasant mood      Treatment Provided   Treatment provided  Cognitive-Linquistic      Cognitive-Linquistic Treatment   Treatment focused on  Voice;Patient/family/caregiver education    Skilled Treatment  Entered room with voice wavering between WNL and pressed voice (30%/70%, respectively). SLP targeted WNL voicing with straw phonation and straw phonation in water. SLP noted pt's voice quality improved after straw singing with water (80% WNL voice/20% pressed) than without (50% WNL voice/50% pressed). Pt ws told to practice straw phonation in  water 5 minutes, without water 3 minutes, then do 20 flow phoation sentences focsing on abdominal support, then generate 10 sentences of her own using some of the same words in the flow phonation sentences - twice each day. Pt then demonstrted fair carryover of WNL voicing to conversationl speech.       Assessment / Recommendations / Plan   Plan  Continue with current plan of care      Progression Toward Goals   Progression toward goals  Progressing toward goals         SLP Short Term Goals - 10/21/19 1553      SLP SHORT TERM GOAL #1   Title  pt will  perform resonant voice tasks with occasional min A in 3 sessions    Time  1    Period  Weeks    Status  Achieved      SLP SHORT TERM GOAL #2   Title  pt will perform 2 aspects of vocal hygiene between 3 sessions    Time  1    Period  Weeks    Status  Achieved      SLP SHORT TERM GOAL #3   Title  pt will perform abdominal breathing (AB) in 18/20 sentences in 3 sessions    Baseline  09-20-19    Time  1    Period  Weeks    Status  Achieved      SLP SHORT TERM GOAL #4   Title  pt will clear throat no more than 2 times in 3 sessions    Time  1    Period  Weeks    Status  Achieved       SLP Long Term Goals - 10/23/19 1142      SLP LONG TERM GOAL #1   Title  pt will achieve WNL voicing in 10 minutes simple-mod complex conversation using compensations in 4 sessions    Time  4    Period  Weeks   or 17 visits, for all LTGs   Status  On-going      SLP LONG TERM GOAL #2   Title  pt will complete resonant voice tasks with rare min A in 3 sessions    Baseline  10-21-19, 10-23-19    Time  4    Period  Weeks    Status  On-going      SLP LONG TERM GOAL #3   Title  pt will drink average > 50oz H2O/day between 4 sessions    Baseline  10-21-19    Time  4    Period  Weeks    Status  On-going      SLP LONG TERM GOAL #4   Title  pt will use AB in 10 minutes simple-mod complex language in 3 sessions    Time  5    Period  Weeks    Status  On-going      SLP LONG TERM GOAL #5   Title  pt will use compensations for throat clearing resulting in 0 throat clears in 3 sessions    Time  4    Period  Weeks    Status  On-going       Plan - 10/23/19 1142    Clinical Impression Statement  Ongoing training and education for 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 Ongoing training for vocal hygiene, abdominal breathing, SOVTE, and flow phonation. Pt remains highly motivated and enjoys coming to [place]. Continue skilled ST to maximize  voice for intelligiblity and QOL.    Speech Therapy  Frequency  2x / week    Duration  --   8 weeks or 17 visits   Treatment/Interventions  Functional tasks;Compensatory techniques;SLP instruction and feedback;Cueing hierarchy;Patient/family education;Internal/external aids;Other (comment)    Potential to Achieve Goals  Good       Patient will benefit from skilled therapeutic intervention in order to improve the following deficits and impairments:   Hoarseness  Other voice and resonance disorders    Problem List Patient Active Problem List   Diagnosis Date Noted  . Hoarseness   . Constipation   . Other dysphagia   . 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  . Insomnia 03/15/2019  . Abdominal pain, epigastric 05/09/2016  . Belching 05/09/2016  . Hyperlipidemia 06/18/2014  . Hyperglycemia 06/18/2014  . Gout 06/18/2014  . Osteopenia 10/28/2013  . Right foot pain 01/31/2013  . Hypertension 03/29/2011  . High cholesterol 03/29/2011    Raniah Karan ,MS, CCC-SLP  10/23/2019, 11:43 AM  [facility] [address] [place], [place], [address] Phone: [phone]   Fax:  [phone]   Name: Terri Douglas MRN: [id number] Date of Birth: [birth year]

## 2019-10-23 NOTE — Patient Instructions (Signed)
  Do twice a day:  Start with straw singing in water for 5 minutes  Go to straw singing without the water - 3 minutes - feel your belly be strong to give your voice a good sound  Say the sentences Mickel Baas made for you - repeat them if you have to get 20 sentences in   Say 10 of your own sentences using some of the same words

## 2019-11-06 ENCOUNTER — Ambulatory Visit: Payer: Medicare Other | Attending: Otolaryngology

## 2019-11-06 ENCOUNTER — Other Ambulatory Visit: Payer: Self-pay

## 2019-11-06 DIAGNOSIS — R498 Other voice and resonance disorders: Secondary | ICD-10-CM | POA: Insufficient documentation

## 2019-11-06 DIAGNOSIS — R490 Dysphonia: Secondary | ICD-10-CM | POA: Diagnosis present

## 2019-11-06 NOTE — Patient Instructions (Signed)
Twice a day, do straw singing for about 3 minutes, then say 5 of Laura's sentences, then 5 of your own. Focus on good vocal quality ("the beautiful voice")

## 2019-11-06 NOTE — Therapy (Signed)
[facility] [address] [place], [place], [address] Phone: [phone]   Fax:  [phone]  Speech Language Pathology Treatment  Patient Details  Name: Terri Douglas MRN: [id number] Date of Birth: 1945/06/14 Referring Provider (SLP): Helayne Seminole., MD   Encounter Date: 11/06/2019  End of Session - 11/06/19 1033    Visit Number  12    Number of Visits  17    Date for SLP Re-Evaluation  11/21/19    SLP Start Time  0934    SLP Stop Time   [id number]    SLP Time Calculation (min)  41 min    Activity Tolerance  Patient tolerated treatment well       Past Medical History:  Diagnosis Date   Arthritis    Complication of anesthesia    Gall stones    GERD (gastroesophageal reflux disease)    History of cardiac catheterization 2005   Minor elevation in cardiac enzymes however no significant obstructive CAD   Hyperlipidemia    Hypertension    Insomnia    PONV (postoperative nausea and vomiting)    Prediabetes     Past Surgical History:  Procedure Laterality Date   A-FLUTTER ABLATION N/A 08/01/2017   Procedure: A-FLUTTER ABLATION;  Surgeon: Thompson Grayer, MD;  Location: [facility];  Service: Cardiovascular;  Laterality: N/A;   ABDOMINAL HYSTERECTOMY     BACK SURGERY  1992   CARDIAC CATHETERIZATION     BACK IN 2004  SHE THINKS IT CAME BACK 'NORMAL'   CHOLECYSTECTOMY N/A 06/24/2015   Procedure: LAPAROSCOPIC CHOLECYSTECTOMY;  Surgeon: Mickeal Skinner, MD;  Location: [facility];  Service: General;  Laterality: N/A;   COLONOSCOPY N/A 08/30/2012   Procedure: COLONOSCOPY;  Surgeon: Rogene Houston, MD;  [facility];  Service: Endoscopy;  Laterality: N/A;  [place]     ESOPHAGOGASTRODUODENOSCOPY  04/15/2011   Procedure: ESOPHAGOGASTRODUODENOSCOPY (EGD);  Surgeon: Rogene Houston, MD;  [facility];  Service: Endoscopy;  Laterality: N/A;  11:30    ESOPHAGOGASTRODUODENOSCOPY N/A 05/27/2016   Procedure: ESOPHAGOGASTRODUODENOSCOPY (EGD);  Surgeon: Rogene Houston, MD;  [facility];  Service: Endoscopy;  Laterality: N/A;  11:15   EYE SURGERY     cataract removal   NECK SURGERY  12/13/2018   REVERSE SHOULDER ARTHROPLASTY Left 04/03/2019   Procedure: REVERSE SHOULDER ARTHROPLASTY;  Surgeon: Hiram Gash, MD;  [facility];  Service: Orthopedics;  Laterality: Left;   VAGINAL HYSTERECTOMY      There were no vitals filed for this visit.         ADULT SLP TREATMENT - 11/06/19 1003      General Information   Behavior/Cognition  Alert;Cooperative;Pleasant mood      Treatment Provided   Treatment provided  Cognitive-Linquistic      Cognitive-Linquistic Treatment   Treatment focused on  Voice;Patient/family/caregiver education    Skilled Treatment  Entered room with mostly-WNL and pressed voice (75%/25%, respectively). SLP targeted WNL voicing with structured-flow phonation tasks and then with short 2-3 minute conversation. Pt with good success with WNL voicing in this task. When pt had pressed voice in conversation she attempted correction >50% of the time and was successful 80% of the time with that self correction. Additionl focus on abdominal support was helpful for pt to attain WNL voicing quality. Pt agreed that x1/week is appropriate for her at this time.  Assessment / Recommendations / Plan   Plan  Continue with current plan of care      Progression Toward Goals   Progression toward goals  Progressing toward goals   reduce to once/week due to progress        SLP Short Term Goals - 10/21/19 1553      SLP SHORT TERM GOAL #1   Title  pt will perform resonant voice tasks with occasional min A in 3 sessions    Time  1    Period  Weeks    Status  Achieved      SLP SHORT TERM GOAL #2   Title  pt will perform 2 aspects of vocal hygiene between 3 sessions    Time  1    Period  Weeks    Status   Achieved      SLP SHORT TERM GOAL #3   Title  pt will perform abdominal breathing (AB) in 18/20 sentences in 3 sessions    Baseline  09-20-19    Time  1    Period  Weeks    Status  Achieved      SLP SHORT TERM GOAL #4   Title  pt will clear throat no more than 2 times in 3 sessions    Time  1    Period  Weeks    Status  Achieved       SLP Long Term Goals - 11/06/19 1035      SLP LONG TERM GOAL #1   Title  pt will achieve WNL voicing in 10 minutes simple-mod complex conversation using compensations in 4 sessions    Time  3    Period  Weeks   or 17 visits, for all LTGs   Status  On-going      SLP LONG TERM GOAL #2   Title  pt will complete resonant voice tasks with rare min A in 3 sessions    Baseline  10-21-19, 10-23-19, 11-06-19    Status  Achieved      SLP LONG TERM GOAL #3   Title  pt will drink average > 50oz H2O/day between 4 sessions    Baseline  10-21-19, 11-06-19    Time  3    Period  Weeks    Status  On-going      SLP LONG TERM GOAL #4   Title  pt will use AB in 10 minutes simple-mod complex language in 3 sessions    Baseline  11-06-19    Time  4    Period  Weeks    Status  On-going      SLP LONG TERM GOAL #5   Title  pt will use compensations for throat clearing resulting in 0 throat clears in 3 sessions    Baseline  11-06-19    Time  3    Period  Weeks    Status  On-going       Plan - 11/06/19 1033    Clinical Impression Statement  Ongoing training and education for HEP and compensations for muscle tension dysphonia using primarily flow phonation and semi-occluded vocal tract tasks. Pt is self-correcting more and is having more success when she does self-correct poor vocal quality. She reduced to once/week today due to progress. Pt remains highly motivated and enjoys coming to [place]. Continue skilled ST to maximize voice for intelligiblity and QOL.    Speech Therapy Frequency  1x /week    Duration  --   8 weeks or  17 visits   Treatment/Interventions  Functional  tasks;Compensatory techniques;SLP instruction and feedback;Cueing hierarchy;Patient/family education;Internal/external aids;Other (comment)    Potential to Achieve Goals  Good       Patient will benefit from skilled therapeutic intervention in order to improve the following deficits and impairments:   Hoarseness  Other voice and resonance disorders    Problem List Patient Active Problem List   Diagnosis Date Noted   Hoarseness    Constipation    Other dysphagia    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   Insomnia 03/15/2019   Abdominal pain, epigastric 05/09/2016   Belching 05/09/2016   Hyperlipidemia 06/18/2014   Hyperglycemia 06/18/2014   Gout 06/18/2014   Osteopenia 10/28/2013   Right foot pain 01/31/2013   Hypertension 03/29/2011   High cholesterol 03/29/2011    Doratha Mcswain ,MS, CCC-SLP  11/06/2019, 10:36 AM  [facility] [address] [place], [place], [address] Phone: [phone]   Fax:  [phone]   Name: Terri Douglas MRN: [id number] Date of Birth: Dec 05, 1945

## 2019-11-08 ENCOUNTER — Other Ambulatory Visit: Payer: Self-pay | Admitting: Family Medicine

## 2019-11-08 ENCOUNTER — Ambulatory Visit: Payer: Medicare Other

## 2019-11-12 ENCOUNTER — Other Ambulatory Visit: Payer: Self-pay

## 2019-11-12 ENCOUNTER — Ambulatory Visit: Payer: Medicare Other

## 2019-11-12 DIAGNOSIS — R490 Dysphonia: Secondary | ICD-10-CM

## 2019-11-12 DIAGNOSIS — R498 Other voice and resonance disorders: Secondary | ICD-10-CM

## 2019-11-13 NOTE — Therapy (Signed)
[facility] [address] [place], [place], [address] Phone: [phone]   Fax:  [phone]  Speech Language Pathology Treatment/Discharge Summary  Patient Details  Name: Terri Douglas MRN: [id number] Date of Birth: April 05, 1946 Referring Provider (SLP): Helayne Seminole., MD   Encounter Date: 11/12/2019  End of Session - 11/13/19 0001    Visit Number  13    Number of Visits  17    Date for SLP Re-Evaluation  11/21/19    SLP Start Time  1106    SLP Stop Time   1140    SLP Time Calculation (min)  34 min    Activity Tolerance  Patient tolerated treatment well       Past Medical History:  Diagnosis Date  . Arthritis   . Complication of anesthesia   . Gall stones   . GERD (gastroesophageal reflux disease)   . History of cardiac catheterization 2005   Minor elevation in cardiac enzymes however no significant obstructive CAD  . Hyperlipidemia   . Hypertension   . Insomnia   . PONV (postoperative nausea and vomiting)   . Prediabetes     Past Surgical History:  Procedure Laterality Date  . A-FLUTTER ABLATION N/A 08/01/2017   Procedure: A-FLUTTER ABLATION;  Surgeon: Thompson Grayer, MD;  Location: [facility];  Service: Cardiovascular;  Laterality: N/A;  . ABDOMINAL HYSTERECTOMY    . BACK SURGERY  1992  . CARDIAC CATHETERIZATION     BACK IN 2004  SHE THINKS IT CAME BACK 'NORMAL'  . CHOLECYSTECTOMY N/A 06/24/2015   Procedure: LAPAROSCOPIC CHOLECYSTECTOMY;  Surgeon: Mickeal Skinner, MD;  Location: [facility];  Service: General;  Laterality: N/A;  . COLONOSCOPY N/A 08/30/2012   Procedure: COLONOSCOPY;  Surgeon: Rogene Houston, MD;  [facility];  Service: Endoscopy;  Laterality: N/A;  830  . DILATION AND CURETTAGE OF UTERUS    . ESOPHAGOGASTRODUODENOSCOPY  04/15/2011   Procedure: ESOPHAGOGASTRODUODENOSCOPY (EGD);  Surgeon: Rogene Houston, MD;  [facility];  Service: Endoscopy;   Laterality: N/A;  11:30  . ESOPHAGOGASTRODUODENOSCOPY N/A 05/27/2016   Procedure: ESOPHAGOGASTRODUODENOSCOPY (EGD);  Surgeon: Rogene Houston, MD;  [facility];  Service: Endoscopy;  Laterality: N/A;  11:15  . EYE SURGERY     cataract removal  . NECK SURGERY  12/13/2018  . REVERSE SHOULDER ARTHROPLASTY Left 04/03/2019   Procedure: REVERSE SHOULDER ARTHROPLASTY;  Surgeon: Hiram Gash, MD;  [facility];  Service: Orthopedics;  Laterality: Left;  Marland Kitchen VAGINAL HYSTERECTOMY      There were no vitals filed for this visit.  Subjective Assessment - 11/12/19 1112    Subjective  "I've been drinking some (water) on the way over here."    Currently in Pain?  No/denies            ADULT SLP TREATMENT - 11/12/19 1113      General Information   Behavior/Cognition  Alert;Cooperative;Pleasant mood      Treatment Provided   Treatment provided  Cognitive-Linquistic      Cognitive-Linquistic Treatment   Treatment focused on  Voice;Patient/family/caregiver education    Skilled Treatment  Pt's voice is WNL today - sounds beautiful! She maintained WNL voicing for 85-90% of the session both in the [facility] room and out of it, as well as outdoors. Pt feels very thankful for therapy. She rated her Voice-Related QOL as 99/100 today = "excellent" and reports her voice has been "very good" in teh last  two weeks. On date of eval she rated her QOL related to her voice as 50/100 -poor to fair. Pt now uses primarily abdominal breathing in converstion and did not clear throat once in today's session.      Assessment / Recommendations / Plan   Plan  Discharge SLP treatment due to (comment)   pt pleased/met goals     Progression Toward Goals   Progression toward goals  --   d/c day      SLP Education - 11/12/19 1121    Education Details  Continue HEP as directed until end of June then pt could decr to x3/week if desired    Person(s) Educated  Patient    Methods  Explanation    Comprehension   Verbalized understanding       SLP Short Term Goals - 10/21/19 1553      SLP SHORT TERM GOAL #1   Title  pt will perform resonant voice tasks with occasional min A in 3 sessions    Time  1    Period  Weeks    Status  Achieved      SLP SHORT TERM GOAL #2   Title  pt will perform 2 aspects of vocal hygiene between 3 sessions    Time  1    Period  Weeks    Status  Achieved      SLP SHORT TERM GOAL #3   Title  pt will perform abdominal breathing (AB) in 18/20 sentences in 3 sessions    Baseline  09-20-19    Time  1    Period  Weeks    Status  Achieved      SLP SHORT TERM GOAL #4   Title  pt will clear throat no more than 2 times in 3 sessions    Time  1    Period  Weeks    Status  Achieved       SLP Long Term Goals - 11/12/19 1122      SLP LONG TERM GOAL #1   Title  pt will achieve WNL voicing in 10 minutes simple-mod complex conversation using compensations in 4 sessions    Baseline  11-12-19    Period  --   or 17 visits, for all LTGs   Status  Partially Met      SLP LONG TERM GOAL #2   Title  pt will complete resonant voice tasks with rare min A in 3 sessions    Baseline  10-21-19, 10-23-19, 11-06-19    Status  Achieved      SLP LONG TERM GOAL #3   Title  pt will drink average > 50oz H2O/day between 4 sessions    Baseline  10-21-19, 11-06-19    Time  2    Period  Weeks    Status  Partially Met      SLP LONG TERM GOAL #4   Title  pt will use AB in 10 minutes simple-mod complex language in 3 sessions    Baseline  11-06-19, 11-12-19    Status  Partially Met      SLP LONG TERM GOAL #5   Title  pt will use compensations for throat clearing resulting in 0 throat clears in 3 sessions    Baseline  11-06-19, 11-12-19    Time  --    Period  --    Status  Achieved       Plan - 11/13/19 0002    Clinical Impression Statement  Ongoing  training and education for HEP and compensations for muscle tension dysphonia using primarily flow phonation and semi-occluded vocal tract tasks. See  "skilled intervetion' for details. Pt agrees with d/c from skilled ST today.    Treatment/Interventions  Functional tasks;Compensatory techniques;SLP instruction and feedback;Cueing hierarchy;Patient/family education;Internal/external aids;Other (comment)    Potential to Achieve Goals  Good       Patient will benefit from skilled therapeutic intervention in order to improve the following deficits and impairments:   Hoarseness  Other voice and resonance disorders   SPEECH THERAPY DISCHARGE SUMMARY  Visits from Start of Care: 13  Current functional level related to goals / functional outcomes: Pt met or partially met all LTGs. She communicated a thankfulness for ST to SLP today. Pt very motivated to maintain WNL voice quality.   Remaining deficits: none   Education / Equipment: Flow phonation, SOVTE for reducing tightness and hoarseness and habitualize relaxed speech, abdominal breathing.  Plan: Patient agrees to discharge.  Patient goals were partially met. Patient is being discharged due to meeting the stated rehab goals.  ?????       Problem List Patient Active Problem List   Diagnosis Date Noted  . Hoarseness   . Constipation   . Other dysphagia   . 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  . Insomnia 03/15/2019  . Abdominal pain, epigastric 05/09/2016  . Belching 05/09/2016  . Hyperlipidemia 06/18/2014  . Hyperglycemia 06/18/2014  . Gout 06/18/2014  . Osteopenia 10/28/2013  . Right foot pain 01/31/2013  . Hypertension 03/29/2011  . High cholesterol 03/29/2011    SCHINKE,CARL ,MS, CCC-SLP  11/13/2019, 12:05 AM  [facility] [address] [place], [place], [address] Phone: [phone]   Fax:  [phone]   Name: Terri Douglas MRN: [id number] Date of Birth: May 11, 1946

## 2019-11-19 ENCOUNTER — Other Ambulatory Visit: Payer: Self-pay

## 2019-11-19 ENCOUNTER — Ambulatory Visit (INDEPENDENT_AMBULATORY_CARE_PROVIDER_SITE_OTHER): Payer: Medicare Other | Admitting: Family Medicine

## 2019-11-19 ENCOUNTER — Encounter: Payer: Self-pay | Admitting: Family Medicine

## 2019-11-19 VITALS — BP 128/78 | Temp 97.9°F | Ht 62.0 in | Wt 137.6 lb

## 2019-11-19 DIAGNOSIS — E7849 Other hyperlipidemia: Secondary | ICD-10-CM

## 2019-11-19 DIAGNOSIS — M8580 Other specified disorders of bone density and structure, unspecified site: Secondary | ICD-10-CM | POA: Diagnosis not present

## 2019-11-19 DIAGNOSIS — M1000 Idiopathic gout, unspecified site: Secondary | ICD-10-CM | POA: Diagnosis not present

## 2019-11-19 DIAGNOSIS — Z780 Asymptomatic menopausal state: Secondary | ICD-10-CM

## 2019-11-19 DIAGNOSIS — R600 Localized edema: Secondary | ICD-10-CM

## 2019-11-19 DIAGNOSIS — G4700 Insomnia, unspecified: Secondary | ICD-10-CM

## 2019-11-19 MED ORDER — PRAVASTATIN SODIUM 80 MG PO TABS: ORAL_TABLET | ORAL | 1 refills | Status: DC

## 2019-11-19 MED ORDER — SERTRALINE HCL 50 MG PO TABS: ORAL_TABLET | ORAL | 1 refills | Status: DC

## 2019-11-19 MED ORDER — ALLOPURINOL 100 MG PO TABS: 100.0000 mg | ORAL_TABLET | Freq: Every day | ORAL | 1 refills | Status: DC

## 2019-11-19 MED ORDER — ALPRAZOLAM 1 MG PO TABS: ORAL_TABLET | ORAL | 5 refills | Status: DC

## 2019-11-19 MED ORDER — TORSEMIDE 20 MG PO TABS: 20.0000 mg | ORAL_TABLET | Freq: Every morning | ORAL | 1 refills | Status: DC

## 2019-11-19 NOTE — Progress Notes (Addendum)
   Subjective:    Patient ID: Terri Douglas, female    DOB: 1946/01/10, 74 y.o.   MRN: [id number]  Hypertension This is a chronic problem. The current episode started more than 1 year ago. Pertinent negatives include no chest pain or shortness of breath. Risk factors for coronary artery disease include dyslipidemia and post-menopausal state. Treatments tried: demadex.   Post-menopausal - Plan: DG Bone Density  Osteopenia, unspecified location - Plan: DG Bone Density     Review of Systems  Constitutional: Negative for activity change and appetite change.  HENT: Negative for congestion and rhinorrhea.   Respiratory: Negative for cough and shortness of breath.   Cardiovascular: Negative for chest pain and leg swelling.  Gastrointestinal: Negative for abdominal pain, nausea and vomiting.  Skin: Negative for color change.  Neurological: Negative for dizziness and weakness.  Psychiatric/Behavioral: Negative for agitation and confusion.       Objective:   Physical Exam Vitals reviewed.  Constitutional:      General: She is not in acute distress. HENT:     Head: Normocephalic.  Cardiovascular:     Rate and Rhythm: Normal rate and regular rhythm.     Heart sounds: Normal heart sounds. No murmur heard.   Pulmonary:     Effort: Pulmonary effort is normal.     Breath sounds: Normal breath sounds.  Lymphadenopathy:     Cervical: No cervical adenopathy.  Neurological:     Mental Status: She is alert.  Psychiatric:        Behavior: Behavior normal.    Fall Risk  11/19/2019 03/15/2019 04/26/2018 12/26/2016 09/11/2015  Falls in the past year? 0 0 0 No No  Comment - - Emmi Telephone Survey: data to providers prior to load Emmi Telephone Survey: data to providers prior to load -  Follow up Falls evaluation completed Falls evaluation completed - - -          Assessment & Plan:  Patient is due for bone density set this up Take Xanax at nighttime for insomnia may continue not causing  her trouble currently Uses a diuretic for pedal edema and helps with blood pressure No gout flareups recently continue allopurinol Moods are doing good patient prefers to continue sertraline Hyperlipidemia continue medication Lab work in the late fall

## 2019-11-20 ENCOUNTER — Encounter: Payer: Medicare Other | Admitting: Speech Pathology

## 2019-12-04 ENCOUNTER — Other Ambulatory Visit: Payer: Self-pay

## 2019-12-04 ENCOUNTER — Ambulatory Visit (HOSPITAL_COMMUNITY)
Admission: RE | Admit: 2019-12-04 | Discharge: 2019-12-04 | Disposition: A | Discharge status: Home / Self Care | Payer: Medicare Other | Source: Ambulatory Visit | Attending: Family Medicine | Admitting: Family Medicine

## 2019-12-04 ENCOUNTER — Telehealth: Payer: Self-pay | Admitting: Family Medicine

## 2019-12-04 DIAGNOSIS — M8580 Other specified disorders of bone density and structure, unspecified site: Secondary | ICD-10-CM | POA: Diagnosis present

## 2019-12-04 DIAGNOSIS — Z780 Asymptomatic menopausal state: Secondary | ICD-10-CM | POA: Diagnosis not present

## 2019-12-04 NOTE — Telephone Encounter (Signed)
Pt dropped off updated medication list for Dr. Nicki Reaper to review along with some other information.   Placed all in providers box in office.

## 2019-12-10 NOTE — Telephone Encounter (Signed)
Nurses please see the medication sheet.  Please make sure her epic list reflects what is on this medication she

## 2019-12-11 NOTE — Addendum Note (Signed)
Addended by: Dairl Ponder on: 12/11/2019 09:57 AM   Modules accepted: Orders

## 2019-12-11 NOTE — Telephone Encounter (Signed)
Med list updated in EPIC

## 2019-12-12 ENCOUNTER — Other Ambulatory Visit: Payer: Self-pay | Admitting: Family Medicine

## 2020-01-14 ENCOUNTER — Ambulatory Visit (HOSPITAL_COMMUNITY)
Admission: RE | Admit: 2020-01-14 | Discharge: 2020-01-14 | Disposition: A | Discharge status: Home / Self Care | Payer: Medicare Other | Source: Ambulatory Visit | Attending: Family Medicine | Admitting: Family Medicine

## 2020-01-14 ENCOUNTER — Other Ambulatory Visit: Payer: Self-pay

## 2020-01-14 ENCOUNTER — Encounter: Payer: Self-pay | Admitting: Family Medicine

## 2020-01-14 ENCOUNTER — Ambulatory Visit (INDEPENDENT_AMBULATORY_CARE_PROVIDER_SITE_OTHER): Payer: Medicare Other | Admitting: Family Medicine

## 2020-01-14 VITALS — BP 110/68 | HR 70 | Temp 97.7°F | Ht 62.0 in | Wt 139.6 lb

## 2020-01-14 DIAGNOSIS — H9190 Unspecified hearing loss, unspecified ear: Secondary | ICD-10-CM | POA: Diagnosis not present

## 2020-01-14 DIAGNOSIS — M25551 Pain in right hip: Secondary | ICD-10-CM | POA: Diagnosis not present

## 2020-01-14 DIAGNOSIS — M25552 Pain in left hip: Secondary | ICD-10-CM | POA: Diagnosis not present

## 2020-01-14 DIAGNOSIS — M160 Bilateral primary osteoarthritis of hip: Secondary | ICD-10-CM | POA: Diagnosis not present

## 2020-01-14 NOTE — Progress Notes (Signed)
Patient ID: Terri Douglas, female    DOB: May 31, 1946, 74 y.o.   MRN: [id number]    Chief Complaint  Patient presents with  . Hip Pain   Subjective:    HPI  right hip pain for 2 weeks. Helped a friend move out of house 3 weeks ago and spent 3 days doing vigorous activity and fears she could have injured herself. Pertinent negatives: no fever, no radiating pain, no numbness or tingling.    Trouble hearing for months. Hearing screen done. Pertinent negatives: no vertigo, no cerumen impaction (able to visualize light reflex in right ear).     Medical History Terri Douglas has a past medical history of Arthritis, Complication of anesthesia, Gall stones, GERD (gastroesophageal reflux disease), History of cardiac catheterization (2005), Hyperlipidemia, Hypertension, Insomnia, PONV (postoperative nausea and vomiting), and Prediabetes.   Outpatient Encounter Medications as of 01/14/2020  Medication Sig  . ALPRAZolam (XANAX) 1 MG tablet TAKE ONE TABLET BY MOUTH AT BEDTIME AS NEEDED  . Biotin 5000 MCG TABS Take 5,000 mcg by mouth daily.  Marland Kitchen docusate sodium (COLACE) 100 MG capsule Take 200 mg by mouth at bedtime.  Marland Kitchen esomeprazole (NEXIUM) 40 MG capsule Take by mouth.  . estradiol (ESTRACE) 2 MG tablet Take 2 mg by mouth daily.   . finasteride (PROSCAR) 5 MG tablet Take 2.5 mg by mouth daily.   . minoxidil (ROGAINE) 2 % external solution Apply 1 application topically at bedtime.   Marland Kitchen OVER THE COUNTER MEDICATION Hardin Negus colon health one daily  . Polyethyl Glycol-Propyl Glycol (LUBRICANT EYE DROPS) 0.4-0.3 % SOLN Place 1-2 drops into both eyes daily as needed (dry/irritated eyes.).  Marland Kitchen potassium chloride (KLOR-CON) 10 MEQ tablet TAKE ONE TABLET (10MEQ TOTAL) BY MOUTH TWO TIMES DAILY  . pravastatin (PRAVACHOL) 80 MG tablet TAKE ONE (1) TABLET BY MOUTH EVERY DAY  . Probiotic Product (PHILLIPS COLON HEALTH PO) Take 1 capsule by mouth daily.  . Sennosides (LAXATIVE) 25 MG TABS Take 25 mg by mouth daily as needed  (constipation.).  Marland Kitchen sertraline (ZOLOFT) 50 MG tablet Take one tablet daily  . torsemide (DEMADEX) 20 MG tablet Take 1 tablet (20 mg total) by mouth every morning.   No facility-administered encounter medications on file as of 01/14/2020.     Review of Systems  Constitutional: Negative for chills and fever.  HENT: Negative.   Eyes: Negative.   Respiratory: Negative.   Cardiovascular: Negative.   Gastrointestinal: Negative.   Endocrine: Negative.   Genitourinary: Negative.   Musculoskeletal: Positive for back pain. Negative for gait problem, joint swelling, myalgias, neck pain and neck stiffness.       Vigorous activity three weeks ago   Skin: Negative.    Hip exam negative except deferred pain to back with internal rotation.   Vitals BP 110/68   Pulse 70   Temp 97.7 F (36.5 C)   Ht 5\' 2"  (1.575 m)   Wt 139 lb 9.6 oz (63.3 kg)   SpO2 98%   BMI 25.53 kg/m   Objective:   Physical Exam Vitals and nursing note reviewed.  Constitutional:      Appearance: Normal appearance.  HENT:     Right Ear: Tympanic membrane, ear canal and external ear normal. There is no impacted cerumen.     Left Ear: Tympanic membrane, ear canal and external ear normal. There is no impacted cerumen.  Cardiovascular:     Rate and Rhythm: Normal rate and regular rhythm.     Pulses: Normal pulses.  Heart sounds: Normal heart sounds.  Pulmonary:     Effort: Pulmonary effort is normal.     Breath sounds: Normal breath sounds.  Abdominal:     Palpations: Abdomen is soft.  Musculoskeletal:        General: No swelling, tenderness, deformity or signs of injury.     Right lower leg: No edema.     Left lower leg: No edema.     Comments: Unaware of injury- possible with vigorous moving project 3 weeks ago. Pain elicited with position changes. Does not radiate and does not cause numbness or tingling.   Skin:    General: Skin is warm and dry.  Neurological:     Mental Status: She is alert and oriented  to person, place, and time.  Psychiatric:        Mood and Affect: Mood normal.        Behavior: Behavior normal.      Assessment and Plan   1. Decreased hearing, unspecified laterality - Ambulatory referral to ENT  2. Hip pain, bilateral - DG HIPS BILAT W OR W/O PELVIS 3-4 VIEWS  Hip Pain: Terri Douglas presents with 3 week c/o of low back pain and points to right pelvis area. Did not fall or injure herself that she can remember, however, she did do some vigorous work 3 weeks ago helping a friend move from her house. She wants to make sure she has not injured herself as she has pain with changing positions. Going to [facility] right now for x-rays and will notify her when results are available. Will use alternating ice and heating pad for 20 minutes per session to see if this is muscular strain. Will hold off on antiinflammatories at this time due to mild kidney dysfunction. She will rest and not perform any more vigorous activity until this problem resolves.   Hearing loss: Reports hearing loss has been progressive for months. Cannot hear husband as well and increasing volume on TV. Was hoping this was a cerumen impaction, and there was some cerumen noted, but not impacted. Will try Debrox OTC 4 times per day for 4 days while awaiting ENT referral for assessment and possible hearing test. Understands that if Debrox helps her hearing she does not have to keep the ENT appointment.  Bone density results:  Osteopenia education information given at discharge and will discuss further at follow-up appointment in 1-2 months.   Patient understands and agrees with plan of care discussed today. Will follow-up sooner if anything changes, otherwise, we will see her back in 1-2 months.  Terri Ades, NP  01/14/2020

## 2020-01-14 NOTE — Patient Instructions (Addendum)
Osteopenia  Osteopenia is a loss of thickness (density) inside of the bones. Another name for osteopenia is low bone mass. Mild osteopenia is a normal part of aging. It is not a disease, and it does not cause symptoms. However, if you have osteopenia and continue to lose bone mass, you could develop a condition that causes the bones to become thin and break more easily (osteoporosis). You may also lose some height, have back pain, and have a stooped posture. Although osteopenia is not a disease, making changes to your lifestyle and diet can help to prevent osteopenia from developing into osteoporosis. What are the causes? Osteopenia is caused by loss of calcium in the bones.  Bones are constantly changing. Old bone cells are continually being replaced with new bone cells. This process builds new bone. The mineral calcium is needed to build new bone and maintain bone density. Bone density is usually highest around age 35. After that, most people's bodies cannot replace all the bone they have lost with new bone. What increases the risk? You are more likely to develop this condition if:  You are older than age 50.  You are a woman who went through menopause early.  You have a long illness that keeps you in bed.  You do not get enough exercise.  You lack certain nutrients (malnutrition).  You have an overactive thyroid gland (hyperthyroidism).  You smoke.  You drink a lot of alcohol.  You are taking medicines that weaken the bones, such as steroids. What are the signs or symptoms? This condition does not cause any symptoms. You may have a slightly higher risk for bone breaks (fractures), so getting fractures more easily than normal may be an indication of osteopenia. How is this diagnosed? Your health care provider can diagnose this condition with a special type of X-ray exam that measures bone density (dual-energy X-ray absorptiometry, DEXA). This test can measure bone density in your  hips, spine, and wrists. Osteopenia has no symptoms, so this condition is usually diagnosed after a routine bone density screening test is done for osteoporosis. This routine screening is usually done for:  Women who are age 65 or older.  Men who are age 70 or older. If you have risk factors for osteopenia, you may have the screening test at an earlier age. How is this treated? Making dietary and lifestyle changes can lower your risk for osteoporosis. If you have severe osteopenia that is close to becoming osteoporosis, your health care provider may prescribe medicines and dietary supplements such as calcium and vitamin D. These supplements help to rebuild bone density. Follow these instructions at home:   Take over-the-counter and prescription medicines only as told by your health care provider. These include vitamins and supplements.  Eat a diet that is high in calcium and vitamin D. ? Calcium is found in dairy products, beans, salmon, and leafy green vegetables like spinach and broccoli. ? Look for foods that have vitamin D and calcium added to them (fortified foods), such as orange juice, cereal, and bread.  Do 30 or more minutes of a weight-bearing exercise every day, such as walking, jogging, or playing a sport. These types of exercises strengthen the bones.  Take precautions at home to lower your risk of falling, such as: ? Keeping rooms well-lit and free of clutter, such as cords. ? Installing safety rails on stairs. ? Using rubber mats in the bathroom or other areas that are often wet or slippery.  Do not use   any products that contain nicotine or tobacco, such as cigarettes and e-cigarettes. If you need help quitting, ask your health care provider.  Avoid alcohol or limit alcohol intake to no more than 1 drink a day for nonpregnant women and 2 drinks a day for men. One drink equals 12 oz of beer, 5 oz of wine, or 1 oz of hard liquor.  Keep all follow-up visits as told by your  health care provider. This is important. Contact a health care provider if:  You have not had a bone density screening for osteoporosis and you are: ? A woman, age 3 or older. ? A man, age 60 or older.  You are a postmenopausal woman who has not had a bone density screening for osteoporosis.  You are older than age 66 and you want to know if you should have bone density screening for osteoporosis. Summary  Osteopenia is a loss of thickness (density) inside of the bones. Another name for osteopenia is low bone mass.  Osteopenia is not a disease, but it may increase your risk for a condition that causes the bones to become thin and break more easily (osteoporosis).  You may be at risk for osteopenia if you are older than age 36 or if you are a woman who went through early menopause.  Osteopenia does not cause any symptoms, but it can be diagnosed with a bone density screening test.  Dietary and lifestyle changes are the first treatment for osteopenia. These may lower your risk for osteoporosis. This information is not intended to replace advice given to you by your health care provider. Make sure you discuss any questions you have with your health care provider. Document Revised: 05/05/2017 Document Reviewed: 03/01/2017 Elsevier Patient Education  [place]. For hearing loss:  Use Debrox OTC 4 times per day for 4 days to see if this will remove some of the wax in your ears.  I will send in a referral for Ear, Nose, and Throat specialist for he/she to evaluate you and possibly do a hearing test.  If you are improved, you do not have to keep this appointment unless you just want a baseline.  For your pelvis pain:  I will order a pelvis and hips x-ray just to make sure you did not injure yourself during the vigorous house cleaning project.  Use alternating ice and heating pad for 20 minutes each session to see if this will help if it is muscular strain.  Hold off on  antiinflammatory drugs like ibuprofen because of kidney function. Do Range of motion exercises listed below.   Low Back Sprain or Strain Rehab Ask your health care provider which exercises are safe for you. Do exercises exactly as told by your health care provider and adjust them as directed. It is normal to feel mild stretching, pulling, tightness, or discomfort as you do these exercises. Stop right away if you feel sudden pain or your pain gets worse. Do not begin these exercises until told by your health care provider. Stretching and range-of-motion exercises These exercises warm up your muscles and joints and improve the movement and flexibility of your back. These exercises also help to relieve pain, numbness, and tingling. Lumbar rotation  1. Lie on your back on a firm surface and bend your knees. 2. Straighten your arms out to your sides so each arm forms a 90-degree angle (right angle) with a side of your body. 3. Slowly move (rotate) both of your knees to one  side of your body until you feel a stretch in your lower back (lumbar). Try not to let your shoulders lift off the floor. 4. Hold this position for ____10______ seconds. 5. Tense your abdominal muscles and slowly move your knees back to the starting position. 6. Repeat this exercise on the other side of your body. Repeat ____5______ times. Complete this exercise ____2-3______ times a day. Single knee to chest  1. Lie on your back on a firm surface with both legs straight. 2. Bend one of your knees. Use your hands to move your knee up toward your chest until you feel a gentle stretch in your lower back and buttock. ? Hold your leg in this position by holding on to the front of your knee. ? Keep your other leg as straight as possible. 3. Hold this position for __________ seconds. 4. Slowly return to the starting position. 5. Repeat with your other leg. Repeat __________ times. Complete this exercise __________ times a day. Prone  extension on elbows  1. Lie on your abdomen on a firm surface (prone position). 2. Prop yourself up on your elbows. 3. Use your arms to help lift your chest up until you feel a gentle stretch in your abdomen and your lower back. ? This will place some of your body weight on your elbows. If this is uncomfortable, try stacking pillows under your chest. ? Your hips should stay down, against the surface that you are lying on. Keep your hip and back muscles relaxed. 4. Hold this position for ___10_______ seconds. 5. Slowly relax your upper body and return to the starting position. Repeat ______5____ times. Complete this exercise ____2-3______ times a day. Strengthening exercises These exercises build strength and endurance in your back. Endurance is the ability to use your muscles for a long time, even after they get tired. Pelvic tilt This exercise strengthens the muscles that lie deep in the abdomen. 1. Lie on your back on a firm surface. Bend your knees and keep your feet flat on the floor. 2. Tense your abdominal muscles. Tip your pelvis up toward the ceiling and flatten your lower back into the floor. ? To help with this exercise, you may place a small towel under your lower back and try to push your back into the towel. 3. Hold this position for ____10______ seconds. 4. Let your muscles relax completely before you repeat this exercise. Repeat _____5_____ times. Complete this exercise __2-3________ times a day. Alternating arm and leg raises  1. Get on your hands and knees on a firm surface. If you are on a hard floor, you may want to use padding, such as an exercise mat, to cushion your knees. 2. Line up your arms and legs. Your hands should be directly below your shoulders, and your knees should be directly below your hips. 3. Lift your left leg behind you. At the same time, raise your right arm and straighten it in front of you. ? Do not lift your leg higher than your hip. ? Do not lift  your arm higher than your shoulder. ? Keep your abdominal and back muscles tight. ? Keep your hips facing the ground. ? Do not arch your back. ? Keep your balance carefully, and do not hold your breath. 4. Hold this position for _____10_____ seconds. 5. Slowly return to the starting position. 6. Repeat with your right leg and your left arm. Repeat ____5______ times. Complete this exercise _2-3_________ times a day. Abdominal set with straight leg raise  1. Lie on your back on a firm surface. 2. Bend one of your knees and keep your other leg straight. 3. Tense your abdominal muscles and lift your straight leg up, 4-6 inches (10-15 cm) off the ground. 4. Keep your abdominal muscles tight and hold this position for ____10______ seconds. ? Do not hold your breath. ? Do not arch your back. Keep it flat against the ground. 5. Keep your abdominal muscles tense as you slowly lower your leg back to the starting position. 6. Repeat with your other leg. Repeat ___5_____ times. Complete this exercise __2-3________ times a day. Single leg lower with bent knees 1. Lie on your back on a firm surface. 2. Tense your abdominal muscles and lift your feet off the floor, one foot at a time, so your knees and hips are bent in 90-degree angles (right angles). ? Your knees should be over your hips and your lower legs should be parallel to the floor. 3. Keeping your abdominal muscles tense and your knee bent, slowly lower one of your legs so your toe touches the ground. 4. Lift your leg back up to return to the starting position. ? Do not hold your breath. ? Do not let your back arch. Keep your back flat against the ground. 5. Repeat with your other leg. Repeat ____5______ times. Complete this exercise ___2-3_______ times a day. Posture and body mechanics Good posture and healthy body mechanics can help to relieve stress in your body's tissues and joints. Body mechanics refers to the movements and positions of  your body while you do your daily activities. Posture is part of body mechanics. Good posture means:  Your spine is in its natural S-curve position (neutral).  Your shoulders are pulled back slightly.  Your head is not tipped forward. Follow these guidelines to improve your posture and body mechanics in your everyday activities. Standing   When standing, keep your spine neutral and your feet about hip width apart. Keep a slight bend in your knees. Your ears, shoulders, and hips should line up.  When you do a task in which you stand in one place for a long time, place one foot up on a stable object that is 2-4 inches (5-10 cm) high, such as a footstool. This helps keep your spine neutral. Sitting   When sitting, keep your spine neutral and keep your feet flat on the floor. Use a footrest, if necessary, and keep your thighs parallel to the floor. Avoid rounding your shoulders, and avoid tilting your head forward.  When working at a desk or a computer, keep your desk at a height where your hands are slightly lower than your elbows. Slide your chair under your desk so you are close enough to maintain good posture.  When working at a computer, place your monitor at a height where you are looking straight ahead and you do not have to tilt your head forward or downward to look at the screen. Resting  When lying down and resting, avoid positions that are most painful for you.  If you have pain with activities such as sitting, bending, stooping, or squatting, lie in a position in which your body does not bend very much. For example, avoid curling up on your side with your arms and knees near your chest (fetal position).  If you have pain with activities such as standing for a long time or reaching with your arms, lie with your spine in a neutral position and bend your knees slightly.  Try the following positions: ? Lying on your side with a pillow between your knees. ? Lying on your back with a  pillow under your knees. Lifting   When lifting objects, keep your feet at least shoulder width apart and tighten your abdominal muscles.  Bend your knees and hips and keep your spine neutral. It is important to lift using the strength of your legs, not your back. Do not lock your knees straight out.  Always ask for help to lift heavy or awkward objects. This information is not intended to replace advice given to you by your health care provider. Make sure you discuss any questions you have with your health care provider. Document Revised: 09/14/2018 Document Reviewed: 06/14/2018 Elsevier Patient Education  [place].

## 2020-01-17 ENCOUNTER — Encounter: Payer: Self-pay | Admitting: Family Medicine

## 2020-01-17 ENCOUNTER — Other Ambulatory Visit: Payer: Self-pay | Admitting: Family Medicine

## 2020-01-17 ENCOUNTER — Ambulatory Visit (INDEPENDENT_AMBULATORY_CARE_PROVIDER_SITE_OTHER): Payer: Medicare Other | Admitting: Family Medicine

## 2020-01-17 ENCOUNTER — Other Ambulatory Visit: Payer: Self-pay

## 2020-01-17 VITALS — BP 110/74 | HR 78 | Temp 97.8°F | Ht 62.0 in | Wt 137.0 lb

## 2020-01-17 DIAGNOSIS — M1611 Unilateral primary osteoarthritis, right hip: Secondary | ICD-10-CM

## 2020-01-17 NOTE — Patient Instructions (Signed)
We are going to send a referral to physical therapy. Exercise and weight loss are great ways to prevent you from becoming less functional Tylenol 500 mg by mouth every 6 hours for 3 days Do not exceed this 2000 mg dose per day.       Osteoarthritis    Osteoarthritis is a type of arthritis that affects tissue that covers the ends of bones in joints (cartilage). Cartilage acts as a cushion between the bones and helps them move smoothly. Osteoarthritis results when cartilage in the joints gets worn down. Osteoarthritis is sometimes called "wear and tear" arthritis. Osteoarthritis is the most common form of arthritis. It often occurs in older people. It is a condition that gets worse over time (a progressive condition). Joints that are most often affected by this condition are in:  Fingers.  Toes.  Hips.  Knees.  Spine, including neck and lower back. What are the causes? This condition is caused by age-related wearing down of cartilage that covers the ends of bones. What increases the risk? The following factors may make you more likely to develop this condition:  Older age.  Being overweight or obese.  Overuse of joints, such as in athletes.  Past injury of a joint.  Past surgery on a joint.  Family history of osteoarthritis. What are the signs or symptoms? The main symptoms of this condition are pain, swelling, and stiffness in the joint. The joint may lose its shape over time. Small pieces of bone or cartilage may break off and float inside of the joint, which may cause more pain and damage to the joint. Small deposits of bone (osteophytes) may grow on the edges of the joint. Other symptoms may include:  A grating or scraping feeling inside the joint when you move it.  Popping or creaking sounds when you move. Symptoms may affect one or more joints. Osteoarthritis in a major joint, such as your knee or hip, can make it painful to walk or exercise. If you have  osteoarthritis in your hands, you might not be able to grip items, twist your hand, or control small movements of your hands and fingers (fine motor skills). How is this diagnosed? This condition may be diagnosed based on:  Your medical history.  A physical exam.  Your symptoms.  X-rays of the affected joint(s).  Blood tests to rule out other types of arthritis. How is this treated? There is no cure for this condition, but treatment can help to control pain and improve joint function. Treatment plans may include:  A prescribed exercise program that allows for rest and joint relief. You may work with a physical therapist.  A weight control plan.  Pain relief techniques, such as: ? Applying heat and cold to the joint. ? Electric pulses delivered to nerve endings under the skin (transcutaneous electrical nerve stimulation, or TENS). ? Massage. ? Certain nutritional supplements.  NSAIDs or prescription medicines to help relieve pain.  Medicine to help relieve pain and inflammation (corticosteroids). This can be given by mouth (orally) or as an injection.  Assistive devices, such as a brace, wrap, splint, specialized glove, or cane.  Surgery, such as: ? An osteotomy. This is done to reposition the bones and relieve pain or to remove loose pieces of bone and cartilage. ? Joint replacement surgery. You may need this surgery if you have very bad (advanced) osteoarthritis. Follow these instructions at home: Activity  Rest your affected joints as directed by your health care provider.  Do  not drive or use heavy machinery while taking prescription pain medicine.  Exercise as directed. Your health care provider or physical therapist may recommend specific types of exercise, such as: ? Strengthening exercises. These are done to strengthen the muscles that support joints that are affected by arthritis. They can be performed with weights or with exercise bands to add  resistance. ? Aerobic activities. These are exercises, such as brisk walking or water aerobics, that get your heart pumping. ? Range-of-motion activities. These keep your joints easy to move. ? Balance and agility exercises. Managing pain, stiffness, and swelling      If directed, apply heat to the affected area as often as told by your health care provider. Use the heat source that your health care provider recommends, such as a moist heat pack or a heating pad. ? If you have a removable assistive device, remove it as told by your health care provider. ? Place a towel between your skin and the heat source. If your health care provider tells you to keep the assistive device on while you apply heat, place a towel between the assistive device and the heat source. ? Leave the heat on for 20-30 minutes. ? Remove the heat if your skin turns bright red. This is especially important if you are unable to feel pain, heat, or cold. You may have a greater risk of getting burned.  If directed, put ice on the affected joint: ? If you have a removable assistive device, remove it as told by your health care provider. ? Put ice in a plastic bag. ? Place a towel between your skin and the bag. If your health care provider tells you to keep the assistive device on during icing, place a towel between the assistive device and the bag. ? Leave the ice on for 20 minutes, 2-3 times a day. General instructions  Take over-the-counter and prescription medicines only as told by your health care provider.  Maintain a healthy weight. Follow instructions from your health care provider for weight control. These may include dietary restrictions.  Do not use any products that contain nicotine or tobacco, such as cigarettes and e-cigarettes. These can delay bone healing. If you need help quitting, ask your health care provider.  Use assistive devices as directed by your health care provider.  Keep all follow-up visits as  told by your health care provider. This is important. Where to find more information  [employer] of Arthritis and Musculoskeletal and Skin Diseases: www.niams.SouthExposed.es  [employer] on Aging: http://kim-miller.com/  American College of Rheumatology: www.rheumatology.org Contact a health care provider if:  Your skin turns red.  You develop a rash.  You have pain that gets worse.  You have a fever along with joint or muscle aches. Get help right away if:  You lose a lot of weight.  You suddenly lose your appetite.  You have night sweats. Summary  Osteoarthritis is a type of arthritis that affects tissue covering the ends of bones in joints (cartilage).  This condition is caused by age-related wearing down of cartilage that covers the ends of bones.  The main symptom of this condition is pain, swelling, and stiffness in the joint.  There is no cure for this condition, but treatment can help to control pain and improve joint function. This information is not intended to replace advice given to you by your health care provider. Make sure you discuss any questions you have with your health care provider. Document Revised: 05/05/2017  Document Reviewed: 01/25/2016 Elsevier Patient Education  [employer].

## 2020-01-17 NOTE — Progress Notes (Signed)
Patient ID: Terri Douglas, female    DOB: Dec 14, 1945, 74 y.o.   MRN: [id number]   Chief Complaint  Patient presents with  . Results   Subjective:    HPIpt arrives to discuss xrays.    Medical History Humaira has a past medical history of Arthritis, Complication of anesthesia, Gall stones, GERD (gastroesophageal reflux disease), History of cardiac catheterization (2005), Hyperlipidemia, Hypertension, Insomnia, PONV (postoperative nausea and vomiting), and Prediabetes.   Outpatient Encounter Medications as of 01/17/2020  Medication Sig  . ALPRAZolam (XANAX) 1 MG tablet TAKE ONE TABLET BY MOUTH AT BEDTIME AS NEEDED  . Biotin 5000 MCG TABS Take 5,000 mcg by mouth daily.  Marland Kitchen docusate sodium (COLACE) 100 MG capsule Take 200 mg by mouth at bedtime.  Marland Kitchen esomeprazole (NEXIUM) 40 MG capsule Take by mouth.  . estradiol (ESTRACE) 2 MG tablet Take 2 mg by mouth daily.   . finasteride (PROSCAR) 5 MG tablet Take 2.5 mg by mouth daily.   . minoxidil (ROGAINE) 2 % external solution Apply 1 application topically at bedtime.   Marland Kitchen OVER THE COUNTER MEDICATION Hardin Negus colon health one daily  . Polyethyl Glycol-Propyl Glycol (LUBRICANT EYE DROPS) 0.4-0.3 % SOLN Place 1-2 drops into both eyes daily as needed (dry/irritated eyes.).  Marland Kitchen potassium chloride (KLOR-CON) 10 MEQ tablet TAKE ONE TABLET (10MEQ TOTAL) BY MOUTH TWO TIMES DAILY  . pravastatin (PRAVACHOL) 80 MG tablet TAKE ONE (1) TABLET BY MOUTH EVERY DAY  . Probiotic Product (PHILLIPS COLON HEALTH PO) Take 1 capsule by mouth daily.  . Sennosides (LAXATIVE) 25 MG TABS Take 25 mg by mouth daily as needed (constipation.).  Marland Kitchen sertraline (ZOLOFT) 50 MG tablet Take one tablet daily  . torsemide (DEMADEX) 20 MG tablet Take 1 tablet (20 mg total) by mouth every morning.   No facility-administered encounter medications on file as of 01/17/2020.     Review of Systems  Constitutional: Negative for fatigue and fever.  Respiratory: Negative.   Cardiovascular:  Negative.   Gastrointestinal: Negative.   Genitourinary: Negative.   Musculoskeletal: Positive for back pain.       Pain: bilateral hips R>L and in low back.  Skin: Negative.   Neurological: Negative.   Hematological: Negative.   Psychiatric/Behavioral:       Experiencing some stressors with helping out a friend.     Vitals BP 110/74   Pulse 78   Temp 97.8 F (36.6 C)   Ht 5\' 2"  (1.575 m)   Wt 137 lb (62.1 kg)   SpO2 99%   BMI 25.06 kg/m   Objective:   Physical Exam Vitals and nursing note reviewed.  Constitutional:      Appearance: Normal appearance.  Pulmonary:     Effort: Pulmonary effort is normal.  Skin:    General: Skin is warm and dry.  Neurological:     General: No focal deficit present.     Mental Status: She is alert and oriented to person, place, and time.  Psychiatric:        Mood and Affect: Mood normal.        Behavior: Behavior normal.      Assessment and Plan   1. Primary osteoarthritis of right hip - PT eval and treat; Future   Take Tylenol 500 mg every 6 hours for 3 days for pain (allergic to NSAIDS).  PT consult for osteoarthritis   Weight loss and exercise are the best ways to prevent future loss of function and independence.  Patient agrees and  understands the plan of care.  Follow up in 3 months if you are not feeling better.  Pecolia Ades, NP  01/17/2020

## 2020-01-21 ENCOUNTER — Encounter: Payer: Self-pay | Admitting: Family Medicine

## 2020-02-14 ENCOUNTER — Inpatient Hospital Stay: Admit: 2020-02-14 | Payer: MEDICARE | Primary: Physician Assistant

## 2020-02-14 DIAGNOSIS — E039 Hypothyroidism, unspecified: Secondary | ICD-10-CM

## 2020-02-14 LAB — CBC WITH AUTOMATED DIFF
ABS. BASOPHILS: 0 10*3/uL (ref 0.0–0.1)
ABS. EOSINOPHILS: 0.3 10*3/uL (ref 0.0–0.4)
ABS. LYMPHOCYTES: 1.5 10*3/uL (ref 0.9–3.6)
ABS. MONOCYTES: 0.4 10*3/uL (ref 0.05–1.2)
ABS. NEUTROPHILS: 3.8 10*3/uL (ref 1.8–8.0)
BASOPHILS: 1 % (ref 0–2)
EOSINOPHILS: 4 % (ref 0–5)
HCT: 43.3 % (ref 35.0–45.0)
HGB: 14.7 g/dL (ref 12.0–16.0)
LYMPHOCYTES: 24 % (ref 21–52)
MCH: 30.1 PG (ref 24.0–34.0)
MCHC: 33.9 g/dL (ref 31.0–37.0)
MCV: 88.7 FL (ref 78.0–100.0)
MONOCYTES: 7 % (ref 3–10)
MPV: 10.6 FL (ref 9.2–11.8)
NEUTROPHILS: 63 % (ref 40–73)
PLATELET: 224 10*3/uL (ref 135–420)
RBC: 4.88 M/uL (ref 4.20–5.30)
RDW: 12.7 % (ref 11.6–14.5)
WBC: 6.1 10*3/uL (ref 4.6–13.2)

## 2020-02-14 LAB — METABOLIC PANEL, COMPREHENSIVE
A-G Ratio: 1.1 (ref 0.8–1.7)
ALT (SGPT): 37 U/L (ref 13–56)
AST (SGOT): 34 U/L (ref 10–38)
Albumin: 3.9 g/dL (ref 3.4–5.0)
Alk. phosphatase: 94 U/L (ref 45–117)
Anion gap: 5 mmol/L (ref 3.0–18)
BUN/Creatinine ratio: 17 (ref 12–20)
BUN: 18 MG/DL (ref 7.0–18)
Bilirubin, total: 0.9 MG/DL (ref 0.2–1.0)
CO2: 31 mmol/L (ref 21–32)
Calcium: 9.7 MG/DL (ref 8.5–10.1)
Chloride: 103 mmol/L (ref 100–111)
Creatinine: 1.04 MG/DL (ref 0.6–1.3)
GFR est AA: >60 mL/min/{1.73_m2} (ref >60)
GFR est non-AA: 52 mL/min/{1.73_m2} — ABNORMAL LOW (ref >60)
Globulin: 3.4 g/dL (ref 2.0–4.0)
Glucose: 101 mg/dL — ABNORMAL HIGH (ref 74–99)
Potassium: 3.7 mmol/L (ref 3.5–5.5)
Protein, total: 7.3 g/dL (ref 6.4–8.2)
Sodium: 139 mmol/L (ref 136–145)

## 2020-02-14 LAB — LIPID PANEL
CHOL/HDL Ratio: 4 (ref 0–5.0)
Chol/HDL Ratio: 4 (ref 0–5.0)
Cholesterol, Total: 238 MG/DL — ABNORMAL HIGH (ref <200)
Cholesterol, total: 238 MG/DL — ABNORMAL HIGH (ref <200)
HDL Cholesterol: 60 MG/DL (ref 40–60)
HDL: 60 MG/DL (ref 40–60)
LDL Calculated: 143.4 MG/DL — ABNORMAL HIGH (ref 0–100)
LDL, calculated: 143.4 MG/DL — ABNORMAL HIGH (ref 0–100)
Triglyceride: 173 MG/DL — ABNORMAL HIGH (ref <150)
Triglycerides: 173 MG/DL — ABNORMAL HIGH (ref <150)
VLDL Cholesterol Calculated: 34.6 MG/DL
VLDL, calculated: 34.6 MG/DL

## 2020-02-14 LAB — VITAMIN D, 25 HYDROXY: Vitamin D 25-Hydroxy: 24.4 ng/mL — ABNORMAL LOW (ref 30–100)

## 2020-02-14 LAB — CBC WITH AUTO DIFFERENTIAL
Basophils %: 1 % (ref 0–2)
Basophils Absolute: 0 10*3/uL (ref 0.0–0.1)
Eosinophils %: 4 % (ref 0–5)
Eosinophils Absolute: 0.3 10*3/uL (ref 0.0–0.4)
Hematocrit: 43.3 % (ref 35.0–45.0)
Hemoglobin: 14.7 g/dL (ref 12.0–16.0)
Lymphocytes %: 24 % (ref 21–52)
Lymphocytes Absolute: 1.5 10*3/uL (ref 0.9–3.6)
MCH: 30.1 PG (ref 24.0–34.0)
MCHC: 33.9 g/dL (ref 31.0–37.0)
MCV: 88.7 FL (ref 78.0–100.0)
MPV: 10.6 FL (ref 9.2–11.8)
Monocytes %: 7 % (ref 3–10)
Monocytes Absolute: 0.4 10*3/uL (ref 0.05–1.2)
Neutrophils %: 63 % (ref 40–73)
Neutrophils Absolute: 3.8 10*3/uL (ref 1.8–8.0)
Platelets: 224 10*3/uL (ref 135–420)
RBC: 4.88 M/uL (ref 4.20–5.30)
RDW: 12.7 % (ref 11.6–14.5)
WBC: 6.1 10*3/uL (ref 4.6–13.2)

## 2020-02-14 LAB — COMPREHENSIVE METABOLIC PANEL
ALT: 37 U/L (ref 13–56)
AST: 34 U/L (ref 10–38)
Albumin/Globulin Ratio: 1.1 (ref 0.8–1.7)
Albumin: 3.9 g/dL (ref 3.4–5.0)
Alkaline Phosphatase: 94 U/L (ref 45–117)
Anion Gap: 5 mmol/L (ref 3.0–18)
BUN: 18 MG/DL (ref 7.0–18)
Bun/Cre Ratio: 17 (ref 12–20)
CO2: 31 mmol/L (ref 21–32)
Calcium: 9.7 MG/DL (ref 8.5–10.1)
Chloride: 103 mmol/L (ref 100–111)
Creatinine: 1.04 MG/DL (ref 0.6–1.3)
EGFR IF NonAfrican American: 52 mL/min/{1.73_m2} — ABNORMAL LOW (ref >60)
GFR African American: >60 mL/min/{1.73_m2} (ref >60)
Globulin: 3.4 g/dL (ref 2.0–4.0)
Glucose: 101 mg/dL — ABNORMAL HIGH (ref 74–99)
Potassium: 3.7 mmol/L (ref 3.5–5.5)
Sodium: 139 mmol/L (ref 136–145)
Total Bilirubin: 0.9 MG/DL (ref 0.2–1.0)
Total Protein: 7.3 g/dL (ref 6.4–8.2)

## 2020-02-14 LAB — VITAMIN D 25 HYDROXY: Vit D, 25-Hydroxy: 24.4 ng/mL — ABNORMAL LOW (ref 30–100)

## 2020-02-20 ENCOUNTER — Other Ambulatory Visit: Payer: Self-pay | Admitting: *Deleted

## 2020-02-20 DIAGNOSIS — M1611 Unilateral primary osteoarthritis, right hip: Secondary | ICD-10-CM

## 2020-02-21 ENCOUNTER — Other Ambulatory Visit: Payer: Self-pay | Admitting: *Deleted

## 2020-02-21 ENCOUNTER — Ambulatory Visit (INDEPENDENT_AMBULATORY_CARE_PROVIDER_SITE_OTHER): Payer: Medicare Other | Admitting: Family Medicine

## 2020-02-21 ENCOUNTER — Other Ambulatory Visit: Payer: Self-pay

## 2020-02-21 ENCOUNTER — Ambulatory Visit (HOSPITAL_COMMUNITY)
Admission: RE | Admit: 2020-02-21 | Discharge: 2020-02-21 | Disposition: A | Discharge status: Home / Self Care | Payer: Medicare Other | Source: Ambulatory Visit | Attending: Family Medicine | Admitting: Family Medicine

## 2020-02-21 ENCOUNTER — Other Ambulatory Visit (HOSPITAL_COMMUNITY)
Admission: RE | Admit: 2020-02-21 | Discharge: 2020-02-21 | Disposition: A | Discharge status: Home / Self Care | Payer: Medicare Other | Source: Ambulatory Visit | Attending: Family Medicine | Admitting: Family Medicine

## 2020-02-21 ENCOUNTER — Encounter: Payer: Self-pay | Admitting: Family Medicine

## 2020-02-21 ENCOUNTER — Encounter: Payer: Self-pay | Admitting: *Deleted

## 2020-02-21 VITALS — BP 136/78 | HR 66 | Temp 96.1°F | Wt 140.0 lb

## 2020-02-21 DIAGNOSIS — R600 Localized edema: Secondary | ICD-10-CM | POA: Insufficient documentation

## 2020-02-21 DIAGNOSIS — R42 Dizziness and giddiness: Secondary | ICD-10-CM | POA: Diagnosis not present

## 2020-02-21 DIAGNOSIS — E876 Hypokalemia: Secondary | ICD-10-CM

## 2020-02-21 DIAGNOSIS — R0602 Shortness of breath: Secondary | ICD-10-CM

## 2020-02-21 DIAGNOSIS — M79645 Pain in left finger(s): Secondary | ICD-10-CM | POA: Diagnosis not present

## 2020-02-21 DIAGNOSIS — M5431 Sciatica, right side: Secondary | ICD-10-CM

## 2020-02-21 DIAGNOSIS — M79642 Pain in left hand: Secondary | ICD-10-CM | POA: Diagnosis not present

## 2020-02-21 LAB — BASIC METABOLIC PANEL
Anion gap: 9 (ref 5–15)
BUN: 13 mg/dL (ref 8–23)
CO2: 29 mmol/L (ref 22–32)
Calcium: 8.6 mg/dL — ABNORMAL LOW (ref 8.9–10.3)
Chloride: 101 mmol/L (ref 98–111)
Creatinine, Ser: 0.96 mg/dL (ref 0.44–1.00)
GFR calc Af Amer: >60 mL/min (ref >60)
GFR calc non Af Amer: 59 mL/min — ABNORMAL LOW (ref >60)
Glucose, Bld: 113 mg/dL — ABNORMAL HIGH (ref 70–99)
Potassium: 3.1 mmol/L — ABNORMAL LOW (ref 3.5–5.1)
Sodium: 139 mmol/L (ref 135–145)

## 2020-02-21 LAB — D-DIMER, QUANTITATIVE: D-Dimer, Quant: 0.42 ug/mL-FEU (ref 0.00–0.50)

## 2020-02-21 MED ORDER — AZITHROMYCIN 250 MG PO TABS: ORAL_TABLET | ORAL | 0 refills | Status: DC

## 2020-02-21 MED ORDER — POTASSIUM CHLORIDE ER 10 MEQ PO TBCR: EXTENDED_RELEASE_TABLET | ORAL | 5 refills | Status: DC

## 2020-02-21 MED ORDER — PREDNISONE 20 MG PO TABS: ORAL_TABLET | ORAL | 0 refills | Status: DC

## 2020-02-21 NOTE — Progress Notes (Signed)
   Subjective:    Patient ID: Terri Douglas, female    DOB: July 06, 1945, 74 y.o.   MRN: [id number]  Leg Pain  The pain is present in the right leg. Associated symptoms comments: Swelling. Was seen back [facility] a few weeks ago. Pt vein in back of legs have began to show more often. Pt also states she has had a hard time trying to catch breath since leg pain. Right side from hip to ankle..  Patient states she is having pain in the right hip anterior region but also in the buttock that goes down the side of her leg side of her leg all the way to the ankle.  Denies any weakness Also relates some soreness in the hip region Pt also states she feels a little dizzy this morning and "doesn't feel right".  At times she finds her self a little bit unbalanced.  She denies any unilateral numbness or weakness. Pt needs refill on Xanax. Takes one at bedtime.  Patient is concerned about the possibility of blood clot Patient at times feels like she cannot get a good deep breath but is able to do things without significant chest discomfort or shortness of breath denies any fevers Review of Systems Please see above    Objective:   Physical Exam Lungs clear respiratory rate normal heart regular pulse normal extremities no edema positive straight leg raise on the right reflexes good strength good previous x-rays reviewed show some osteoarthritis       Assessment & Plan:  1. Right sided sciatica Stretching exercises recommended May use low-dose steroid over the next 5 days We will discuss with patient if she is interested in gabapentin  2. Pedal edema D-dimer ordered because of the potential for blood clots I think the likelihood is low. - D-Dimer, Quantitative - Basic Metabolic Panel (BMET)  3. Dizziness I find no evidence of stroke going on currently she will monitor closely - D-Dimer, Quantitative - Basic Metabolic Panel (BMET)  4. Left hand pain Probable arthritic changes in the thumb M CP joint  may need orthopedic referral for injection if it gets worse - DG Hand Complete Left  5. SOB (shortness of breath) Occasionally she feels like she cannot get a good deep breath lungs sound clear we will check a chest x-ray - DG Chest 2 View Hip osteoarthritis  Her lab work came back showing potassium is low we will double up on the potassium and recheck it again in 3 to 4 weeks 20 mEq twice daily  Her D-dimer is negative no sign of blood clot Chest x-ray shows an area along the left lingula that may or may not be of concern could be related to infection could be atelectasis or scarring The likelihood of infection is low but we will cover for that possibility I recommend a follow-up chest x-ray in 4 weeks

## 2020-02-21 NOTE — Progress Notes (Signed)
Patient notified of, rx sent.

## 2020-02-24 ENCOUNTER — Other Ambulatory Visit: Payer: Self-pay | Admitting: *Deleted

## 2020-02-24 DIAGNOSIS — M1611 Unilateral primary osteoarthritis, right hip: Secondary | ICD-10-CM

## 2020-03-04 ENCOUNTER — Encounter: Payer: Self-pay | Admitting: Family Medicine

## 2020-03-12 ENCOUNTER — Encounter (HOSPITAL_COMMUNITY): Payer: Self-pay | Admitting: Physical Therapy

## 2020-03-12 ENCOUNTER — Ambulatory Visit (HOSPITAL_COMMUNITY): Payer: Medicare Other | Attending: Family Medicine | Admitting: Physical Therapy

## 2020-03-12 ENCOUNTER — Other Ambulatory Visit: Payer: Self-pay

## 2020-03-12 DIAGNOSIS — R2689 Other abnormalities of gait and mobility: Secondary | ICD-10-CM

## 2020-03-12 DIAGNOSIS — M6281 Muscle weakness (generalized): Secondary | ICD-10-CM | POA: Diagnosis not present

## 2020-03-12 DIAGNOSIS — M545 Low back pain, unspecified: Secondary | ICD-10-CM | POA: Diagnosis not present

## 2020-03-12 DIAGNOSIS — M25551 Pain in right hip: Secondary | ICD-10-CM | POA: Diagnosis not present

## 2020-03-12 DIAGNOSIS — R29898 Other symptoms and signs involving the musculoskeletal system: Secondary | ICD-10-CM

## 2020-03-12 NOTE — Patient Instructions (Signed)
Access Code: [facility] URL: [email or web address] Date: 03/12/2020 Prepared by: Mitzi Hansen Lexander Tremblay  Exercises Squat with Counter Support - 2 x daily - 7 x weekly - 2 sets - 10 reps

## 2020-03-12 NOTE — Therapy (Signed)
[place], [place], [address] Phone: [phone]   Fax:  [phone]  Physical Therapy Evaluation  Patient Details  Name: Terri Douglas MRN: [id number] Date of Birth: Oct 20, 1945 Referring Provider (PT): Malena Lovena Le DO   Encounter Date: 03/12/2020   PT End of Session - 03/12/20 1204    Visit Number 1    Number of Visits 12    Date for PT Re-Evaluation 05/07/20   Patient will begin in 2 weeks after vacation   Authorization Type Primary: Medicare Secondary BSBS supplemental  (follow medicare guidelines)    Progress Note Due on Visit 10    PT Start Time 1046    PT Stop Time 1127    PT Time Calculation (min) 41 min    Activity Tolerance Patient tolerated treatment well    Behavior During Therapy [facility] for tasks assessed/performed           Past Medical History:  Diagnosis Date  . Arthritis   . Complication of anesthesia   . Gall stones   . GERD (gastroesophageal reflux disease)   . History of cardiac catheterization 2005   Minor elevation in cardiac enzymes however no significant obstructive CAD  . Hyperlipidemia   . Hypertension   . Insomnia   . PONV (postoperative nausea and vomiting)   . Prediabetes     Past Surgical History:  Procedure Laterality Date  . A-FLUTTER ABLATION N/A 08/01/2017   Procedure: A-FLUTTER ABLATION;  Surgeon: Thompson Grayer, MD;  Location: [place] [facility];  Service: Cardiovascular;  Laterality: N/A;  . ABDOMINAL HYSTERECTOMY    . BACK SURGERY  1992  . CARDIAC CATHETERIZATION     BACK IN 2004  SHE THINKS IT CAME BACK 'NORMAL'  . CHOLECYSTECTOMY N/A 06/24/2015   Procedure: LAPAROSCOPIC CHOLECYSTECTOMY;  Surgeon: Mickeal Skinner, MD;  Location: [place];  Service: General;  Laterality: N/A;  . COLONOSCOPY N/A 08/30/2012   Procedure: COLONOSCOPY;  Surgeon: Rogene Houston, MD;  [facility];  Service: Endoscopy;  Laterality: N/A;  830  . DILATION AND CURETTAGE OF  UTERUS    . ESOPHAGOGASTRODUODENOSCOPY  04/15/2011   Procedure: ESOPHAGOGASTRODUODENOSCOPY (EGD);  Surgeon: Rogene Houston, MD;  [facility];  Service: Endoscopy;  Laterality: N/A;  11:30  . ESOPHAGOGASTRODUODENOSCOPY N/A 05/27/2016   Procedure: ESOPHAGOGASTRODUODENOSCOPY (EGD);  Surgeon: Rogene Houston, MD;  Location[facility];  Service: Endoscopy;  Laterality: N/A;  11:15  . EYE SURGERY     cataract removal  . NECK SURGERY  12/13/2018  . REVERSE SHOULDER ARTHROPLASTY Left 04/03/2019   Procedure: REVERSE SHOULDER ARTHROPLASTY;  Surgeon: Hiram Gash, MD;  Location: [facility];  Service: Orthopedics;  Laterality: Left;  Marland Kitchen VAGINAL HYSTERECTOMY      There were no vitals filed for this visit.    Subjective Assessment - 03/12/20 1044    Subjective Patient is a 74 y.o. female who presents to physical therapy with c/o R hip pain. Patient states pain in the front of her hip for about the last 6 weeks. She states sciatica symptoms as well which have resolved. She has had 4 neck surgeries, 1 back surgery, and shoulder surgeries. Patient states difficulty reaching to R foot because her leg doesn't want to move. She has increased pain with L side lying. States some symptoms with sitting. Patient states her main goal is to get rid of pain.    Pertinent History hx back pain, hx back and  neck surgeries    Limitations Sitting;Other (comment)   bending hip   How long can you walk comfortably? unrestricted    Patient Stated Goals decrease pain    Currently in Pain? Yes    Pain Score 4    8/10 at worst   Pain Location Hip    Pain Orientation Right              [facility] PT Assessment - 03/12/20 0001      Assessment   Medical Diagnosis OA R hip    Referring Provider (PT) Malena Lovena Le DO    Onset Date/Surgical Date 01/29/20    Prior Therapy PT and OT      Precautions   Precautions None      Restrictions   Weight Bearing Restrictions No      Balance Screen   Has the patient  fallen in the past 6 months No    Has the patient had a decrease in activity level because of a fear of falling?  No    Is the patient reluctant to leave their home because of a fear of falling?  No      Prior Function   Level of Independence Independent      Cognition   Overall Cognitive Status Within Functional Limits for tasks assessed      Observation/Other Assessments   Observations Ambulates without AD    Focus on Therapeutic Outcomes (FOTO)  20% limited      ROM / Strength   AROM / PROM / Strength AROM;Strength;PROM      AROM   Overall AROM Comments R hip flexion decreased       PROM   Overall PROM Comments R hip decreased Flexion, ER, IR ROM and painful on R, L WFL      Strength   Strength Assessment Site Hip;Knee;Ankle    Right/Left Hip Right;Left    Right Hip Flexion 4+/5    Right Hip Extension 3+/5    Right Hip ABduction 4/5    Left Hip Flexion 4/5    Left Hip Extension 3+/5    Left Hip ABduction 4/5    Right/Left Knee Right;Left    Right Knee Flexion 5/5    Right Knee Extension 4/5    Left Knee Flexion 5/5    Left Knee Extension 5/5    Right/Left Ankle Right;Left    Right Ankle Dorsiflexion 5/5    Left Ankle Dorsiflexion 5/5      Palpation   Palpation comment TTP R glutes, piriformis, not tender in L hip      Special Tests   Other special tests Forward step down test: bilaterally requires use of hands, decreased eccentric control bilateral, R>L difficulty with concentric phase      Ambulation/Gait   Ambulation/Gait Yes    Ambulation/Gait Assistance 7: Independent    Ambulation Distance (Feet) 150 Feet    Assistive device None    Gait Pattern Within Functional Limits    Stairs Yes    Stairs Assistance 7: Independent    Stair Management Technique One rail Right;Alternating pattern    Number of Stairs 8    Height of Stairs 7    Gait Comments able to navigate stairs with alternating pattern with minimally decreased eccentric control                       Objective measurements completed on examination: See above findings.       [facility] Adult PT  Treatment/Exercise - 03/12/20 0001      Exercises   Exercises Knee/Hip      Knee/Hip Exercises: Standing   Functional Squat 2 sets;10 reps                  PT Education - 03/12/20 1044    Education Details Patient educated on exam findings, POC, scope of PT, initial HEP    Person(s) Educated Patient    Methods Explanation;Demonstration;Handout    Comprehension Verbalized understanding;Returned demonstration            PT Short Term Goals - 03/12/20 1217      PT SHORT TERM GOAL #1   Title Patient will be independent with HEP in order to improve functional outcomes.    Time 3    Period Weeks    Status New    Target Date 04/16/20      PT SHORT TERM GOAL #2   Title Patient will report at least 25% improvement in symptoms for improved quality of life.    Time 3    Period Weeks    Status New    Target Date 04/16/20             PT Long Term Goals - 03/12/20 1218      PT LONG TERM GOAL #1   Title Patient will report at least 75% improvement in symptoms for improved quality of life.    Time 6    Period Weeks    Status New    Target Date 05/07/20      PT LONG TERM GOAL #2   Title Patient will improve FOTO score by at least 5 points in order to indicate improved tolerance to activity.    Time 6    Period Weeks    Status New    Target Date 05/07/20      PT LONG TERM GOAL #3   Title Patient will be able to complete forward step down test without deviation in order to demonstrate improved LE strength.    Time 6    Period Weeks    Status New    Target Date 05/07/20                  Plan - 03/12/20 1207    Clinical Impression Statement Patient is a 74 y.o. female who presents to physical therapy with c/o R hip pain which began with insidious onset about 6 weeks ago. She presents with pain limited deficits in R hip strength, ROM,  endurance, and functional mobility with ADL. She is having to modify and restrict ADL as indicated by FOTO score as well as subjective information and objective measures which is affecting overall participation. Patient will benefit from skilled physical therapy in order to improve function and reduce impairment.    Personal Factors and Comorbidities Age;Comorbidity 2;Fitness;Behavior Pattern    Comorbidities hx back pain, numerous surgeries    Examination-Activity Limitations Sit;Bend    Examination-Participation Restrictions Shop;Yard Work;Cleaning    Stability/Clinical Decision Making Stable/Uncomplicated    Clinical Decision Making Low    Rehab Potential Fair    PT Frequency 2x / week    PT Duration 6 weeks   beginning after her vacation   PT Treatment/Interventions ADLs/Self Care Home Management;Aquatic Therapy;Biofeedback;Stair training;Cryotherapy;Electrical Stimulation;Iontophoresis 4mg /ml Dexamethasone;Moist Heat;Traction;Ultrasound;DME Instruction;Functional mobility training;Gait training;Therapeutic activities;Therapeutic exercise;Balance training;Neuromuscular re-education;Patient/family education;Orthotic Fit/Training;Manual techniques;Dry needling;Passive range of motion;Scar mobilization;Spinal Manipulations;Joint Manipulations;Energy conservation    PT Next Visit Plan begin glute strengthing, progress hip ROM/mobility as  able, Functional LE strengthing    PT Home Exercise Plan 03/12/20 squats    Consulted and Agree with Plan of Care Patient           Patient will benefit from skilled therapeutic intervention in order to improve the following deficits and impairments:  Decreased activity tolerance, Decreased balance, Decreased mobility, Increased muscle spasms, Improper body mechanics, Decreased range of motion  Visit Diagnosis: Pain in right hip  Low back pain, unspecified back pain laterality, unspecified chronicity, unspecified whether sciatica present  Muscle weakness  (generalized)  Other abnormalities of gait and mobility  Other symptoms and signs involving the musculoskeletal system     Problem List Patient Active Problem List   Diagnosis Date Noted  . Primary osteoarthritis of right hip 01/17/2020  . Hip pain, bilateral 01/14/2020  . Decreased hearing 01/14/2020  . Pedal edema 11/19/2019  . Hoarseness   . Constipation   . Other dysphagia   . 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  . Insomnia 03/15/2019  . Abdominal pain, epigastric 05/09/2016  . Belching 05/09/2016  . Hyperlipidemia 06/18/2014  . Hyperglycemia 06/18/2014  . Gout 06/18/2014  . Osteopenia 10/28/2013  . Right foot pain 01/31/2013  . Hypertension 03/29/2011  . High cholesterol 03/29/2011   12:22 PM, 03/12/20 Mearl Latin PT, DPT Physical Therapist at [place], [place], [address] Phone: [phone]   Fax:  [phone]  Name: Terri Douglas MRN: [id number] Date of Birth: [birth year]

## 2020-03-13 ENCOUNTER — Ambulatory Visit (HOSPITAL_COMMUNITY)
Admission: RE | Admit: 2020-03-13 | Discharge: 2020-03-13 | Disposition: A | Discharge status: Home / Self Care | Payer: Medicare Other | Source: Ambulatory Visit | Attending: Family Medicine | Admitting: Family Medicine

## 2020-03-13 DIAGNOSIS — E876 Hypokalemia: Secondary | ICD-10-CM | POA: Diagnosis not present

## 2020-03-13 DIAGNOSIS — R0602 Shortness of breath: Secondary | ICD-10-CM | POA: Insufficient documentation

## 2020-03-14 ENCOUNTER — Other Ambulatory Visit: Payer: Self-pay | Admitting: Family Medicine

## 2020-03-14 LAB — BASIC METABOLIC PANEL
BUN/Creatinine Ratio: 10 — ABNORMAL LOW (ref 12–28)
BUN: 10 mg/dL (ref 8–27)
CO2: 27 mmol/L (ref 20–29)
Calcium: 9.2 mg/dL (ref 8.7–10.3)
Chloride: 103 mmol/L (ref 96–106)
Creatinine, Ser: 0.99 mg/dL (ref 0.57–1.00)
GFR calc Af Amer: 65 mL/min/{1.73_m2} (ref >59)
GFR calc non Af Amer: 57 mL/min/{1.73_m2} — ABNORMAL LOW (ref >59)
Glucose: 98 mg/dL (ref 65–99)
Potassium: 4.5 mmol/L (ref 3.5–5.2)
Sodium: 144 mmol/L (ref 134–144)

## 2020-03-16 ENCOUNTER — Encounter: Payer: Self-pay | Admitting: Family Medicine

## 2020-03-16 ENCOUNTER — Ambulatory Visit (INDEPENDENT_AMBULATORY_CARE_PROVIDER_SITE_OTHER): Payer: Medicare Other | Admitting: Family Medicine

## 2020-03-16 VITALS — BP 128/72 | HR 68 | Temp 95.4°F | Wt 138.8 lb

## 2020-03-16 DIAGNOSIS — Z23 Encounter for immunization: Secondary | ICD-10-CM

## 2020-03-16 DIAGNOSIS — R0602 Shortness of breath: Secondary | ICD-10-CM

## 2020-03-16 DIAGNOSIS — M1611 Unilateral primary osteoarthritis, right hip: Secondary | ICD-10-CM | POA: Diagnosis not present

## 2020-03-16 DIAGNOSIS — E876 Hypokalemia: Secondary | ICD-10-CM | POA: Diagnosis not present

## 2020-03-16 NOTE — Progress Notes (Signed)
   Subjective:    Patient ID: Terri Douglas, female    DOB: 1945-09-15, 74 y.o.   MRN: [id number]  HPI Pt here for follow up on right sided hip pain. Pt states pain is much better.  Patient states her breathing is doing better.  She is not short of breath with activity no chest tightness pressure pain Her back pain is doing better with minimal radiation into the leg Her right hip pain is starting to feel better but she is doing some physical therapy and in addition to this she takes Tylenol when necessary.  Able to do what she needs to do without major difficulty Her x-rays of her hip were shown to her as well as x-rays of the chest  Review of Systems  Constitutional: Negative for activity change, fatigue and fever.  HENT: Negative for congestion and rhinorrhea.   Respiratory: Negative for cough, chest tightness and shortness of breath.   Cardiovascular: Negative for chest pain and leg swelling.  Gastrointestinal: Negative for abdominal pain and nausea.  Skin: Negative for color change.  Neurological: Negative for dizziness and headaches.  Psychiatric/Behavioral: Negative for agitation and behavioral problems.       Objective:   Physical Exam Vitals reviewed.  Constitutional:      General: She is not in acute distress. HENT:     Head: Normocephalic.  Cardiovascular:     Rate and Rhythm: Normal rate and regular rhythm.     Heart sounds: Normal heart sounds. No murmur heard.   Pulmonary:     Effort: Pulmonary effort is normal.     Breath sounds: Normal breath sounds.  Lymphadenopathy:     Cervical: No cervical adenopathy.  Neurological:     Mental Status: She is alert.  Psychiatric:        Behavior: Behavior normal.           Assessment & Plan:  1. Need for vaccination Flu shot today - Flu Vaccine QUAD High Dose(Fluad)  2. Osteoarthritis of right hip, unspecified osteoarthritis type Arthritis of the hip shared decision no orthopedic referral currently Tylenol as  needed stretching recommended  3. SOB (shortness of breath) This is getting much better.  No need to do any type of pulmonary work-up currently.  4. Low blood potassium Potassium is corrected doing well Patient to follow-up within 4 to 6 months

## 2020-03-18 DIAGNOSIS — L578 Other skin changes due to chronic exposure to nonionizing radiation: Secondary | ICD-10-CM | POA: Diagnosis not present

## 2020-03-18 DIAGNOSIS — H61001 Unspecified perichondritis of right external ear: Secondary | ICD-10-CM | POA: Diagnosis not present

## 2020-03-18 DIAGNOSIS — L814 Other melanin hyperpigmentation: Secondary | ICD-10-CM | POA: Diagnosis not present

## 2020-03-18 DIAGNOSIS — D485 Neoplasm of uncertain behavior of skin: Secondary | ICD-10-CM | POA: Diagnosis not present

## 2020-03-18 DIAGNOSIS — L570 Actinic keratosis: Secondary | ICD-10-CM | POA: Diagnosis not present

## 2020-03-18 DIAGNOSIS — R58 Hemorrhage, not elsewhere classified: Secondary | ICD-10-CM | POA: Diagnosis not present

## 2020-03-18 DIAGNOSIS — D0439 Carcinoma in situ of skin of other parts of face: Secondary | ICD-10-CM | POA: Diagnosis not present

## 2020-03-18 DIAGNOSIS — D224 Melanocytic nevi of scalp and neck: Secondary | ICD-10-CM | POA: Diagnosis not present

## 2020-03-18 DIAGNOSIS — Z85828 Personal history of other malignant neoplasm of skin: Secondary | ICD-10-CM | POA: Diagnosis not present

## 2020-03-18 DIAGNOSIS — D2271 Melanocytic nevi of right lower limb, including hip: Secondary | ICD-10-CM | POA: Diagnosis not present

## 2020-03-18 DIAGNOSIS — D2261 Melanocytic nevi of right upper limb, including shoulder: Secondary | ICD-10-CM | POA: Diagnosis not present

## 2020-03-30 ENCOUNTER — Ambulatory Visit (HOSPITAL_COMMUNITY): Payer: Medicare Other

## 2020-03-30 ENCOUNTER — Telehealth (HOSPITAL_COMMUNITY): Payer: Self-pay

## 2020-03-30 NOTE — Telephone Encounter (Signed)
pt cancelled appt for 10/27, no reason given

## 2020-04-01 ENCOUNTER — Ambulatory Visit (HOSPITAL_COMMUNITY): Payer: Medicare Other

## 2020-04-03 DIAGNOSIS — Z23 Encounter for immunization: Secondary | ICD-10-CM | POA: Diagnosis not present

## 2020-04-06 ENCOUNTER — Ambulatory Visit (HOSPITAL_COMMUNITY): Payer: Medicare Other | Attending: Family Medicine

## 2020-04-06 ENCOUNTER — Encounter (HOSPITAL_COMMUNITY): Payer: Self-pay

## 2020-04-06 ENCOUNTER — Other Ambulatory Visit: Payer: Self-pay

## 2020-04-06 DIAGNOSIS — M25551 Pain in right hip: Secondary | ICD-10-CM

## 2020-04-06 DIAGNOSIS — M6281 Muscle weakness (generalized): Secondary | ICD-10-CM | POA: Diagnosis not present

## 2020-04-06 DIAGNOSIS — M545 Low back pain, unspecified: Secondary | ICD-10-CM | POA: Diagnosis not present

## 2020-04-06 DIAGNOSIS — R29898 Other symptoms and signs involving the musculoskeletal system: Secondary | ICD-10-CM | POA: Diagnosis not present

## 2020-04-06 DIAGNOSIS — R2689 Other abnormalities of gait and mobility: Secondary | ICD-10-CM

## 2020-04-06 NOTE — Therapy (Signed)
[place], [place], [address] Phone: [phone]   Fax:  [phone]  Physical Therapy Treatment  Patient Details  Name: Terri Douglas MRN: [id number] Date of Birth: 1945/12/02 Referring Provider (PT): Malena Lovena Le DO   Encounter Date: 04/06/2020   PT End of Session - 04/06/20 1018    Visit Number 2    Number of Visits 12    Date for PT Re-Evaluation 05/07/20    Authorization Type Primary: Medicare Secondary BSBS supplemental  (follow medicare guidelines)    Progress Note Due on Visit 10    PT Start Time 0944    PT Stop Time 1025    PT Time Calculation (min) 41 min    Activity Tolerance Patient tolerated treatment well    Behavior During Therapy [facility] for tasks assessed/performed           Past Medical History:  Diagnosis Date  . Arthritis   . Complication of anesthesia   . Gall stones   . GERD (gastroesophageal reflux disease)   . History of cardiac catheterization 2005   Minor elevation in cardiac enzymes however no significant obstructive CAD  . Hyperlipidemia   . Hypertension   . Insomnia   . PONV (postoperative nausea and vomiting)   . Prediabetes     Past Surgical History:  Procedure Laterality Date  . A-FLUTTER ABLATION N/A 08/01/2017   Procedure: A-FLUTTER ABLATION;  Surgeon: Thompson Grayer, MD;  Location: [facility];  Service: Cardiovascular;  Laterality: N/A;  . ABDOMINAL HYSTERECTOMY    . BACK SURGERY  1992  . CARDIAC CATHETERIZATION     BACK IN 2004  SHE THINKS IT CAME BACK 'NORMAL'  . CHOLECYSTECTOMY N/A 06/24/2015   Procedure: LAPAROSCOPIC CHOLECYSTECTOMY;  Surgeon: Mickeal Skinner, MD;  Location: [facility];  Service: General;  Laterality: N/A;  . COLONOSCOPY N/A 08/30/2012   Procedure: COLONOSCOPY;  Surgeon: Rogene Houston, MD;  [facility];  Service: Endoscopy;  Laterality: N/A;  830  . DILATION AND CURETTAGE OF UTERUS    . ESOPHAGOGASTRODUODENOSCOPY  04/15/2011     Procedure: ESOPHAGOGASTRODUODENOSCOPY (EGD);  Surgeon: Rogene Houston, MD;  [facility];  Service: Endoscopy;  Laterality: N/A;  11:30  . ESOPHAGOGASTRODUODENOSCOPY N/A 05/27/2016   Procedure: ESOPHAGOGASTRODUODENOSCOPY (EGD);  Surgeon: Rogene Houston, MD;  [facility];  Service: Endoscopy;  Laterality: N/A;  11:15  . EYE SURGERY     cataract removal  . NECK SURGERY  12/13/2018  . REVERSE SHOULDER ARTHROPLASTY Left 04/03/2019   Procedure: REVERSE SHOULDER ARTHROPLASTY;  Surgeon: Hiram Gash, MD;  Location: [facility];  Service: Orthopedics;  Laterality: Left;  Marland Kitchen VAGINAL HYSTERECTOMY      There were no vitals filed for this visit.   Subjective Assessment - 04/06/20 0952    Subjective No pain currently but it really depends on what she is doing; painful left side-lying    Pertinent History hx back pain, hx back and neck surgeries    Limitations Sitting;Other (comment)   bending hip   How long can you walk comfortably? unrestricted    Patient Stated Goals decrease pain              [facility] Adult PT Treatment/Exercise - 04/06/20 0001      Knee/Hip Exercises: Stretches   Piriformis Stretch Right;2 reps;30 seconds    Other Knee/Hip Stretches figure 4 30 sec x2 right      Knee/Hip Exercises: Standing  Functional Squat 1 set;10 reps    Functional Squat Limitations to chair      Knee/Hip Exercises: Supine   Bridges Strengthening;Both;1 set;10 reps    Bridges Limitations 5 sec hold    Other Supine Knee/Hip Exercises butterfly bilateral 30 sec x2      Knee/Hip Exercises: Sidelying   Hip ABduction Strengthening;Both;1 set;10 reps    Clams bilateral x10      Knee/Hip Exercises: Prone   Straight Leg Raises Strengthening;Both;1 set;10 reps      Manual Therapy   Manual Therapy Myofascial release    Manual therapy comments completed separate from all other interventions    Myofascial Release right glut, piriformis, sacral border             PT  Education - 04/06/20 0954    Education Details Reviewed eval, goals and plan of care. Discussed purpose and technique of interventions throughout session. Advanced HEP.    Person(s) Educated Patient    Methods Explanation;Handout    Comprehension Verbalized understanding            PT Short Term Goals - 04/06/20 1050      PT SHORT TERM GOAL #1   Title Patient will be independent with HEP in order to improve functional outcomes.    Time 3    Period Weeks    Status On-going    Target Date 04/16/20      PT SHORT TERM GOAL #2   Title Patient will report at least 25% improvement in symptoms for improved quality of life.    Time 3    Period Weeks    Status On-going    Target Date 04/16/20             PT Long Term Goals - 04/06/20 1050      PT LONG TERM GOAL #1   Title Patient will report at least 75% improvement in symptoms for improved quality of life.    Time 6    Period Weeks    Status On-going      PT LONG TERM GOAL #2   Title Patient will improve FOTO score by at least 5 points in order to indicate improved tolerance to activity.    Time 6    Period Weeks    Status On-going      PT LONG TERM GOAL #3   Title Patient will be able to complete forward step down test without deviation in order to demonstrate improved LE strength.    Time 6    Period Weeks    Status New            Plan - 04/06/20 1049    Clinical Impression Statement Reviewed evaluation, goals and plan of care. Session focused on therapeutic exercises, stretches and manual therapy to decrease pain. Myofascial release to right gluteals, piriformis and sacral border for pain reduction. Added supine butterfly, figure 4 and piriformis stretches, as well as, supine bridge and sidelying hip abduction, clams, and prone hip extension. To HEP added supine butterfly stretch, sidelying clam and hip abduction and prone hip extension. No increase in pain complaints at end of treatment session. Patient will benefit  from continued skilled physical therapy in order to improve function and reduce impairment.    Personal Factors and Comorbidities Age;Comorbidity 2;Fitness;Behavior Pattern    Comorbidities hx back pain, numerous surgeries    Examination-Activity Limitations Sit;Bend    Examination-Participation Restrictions Shop;Yard Work;Cleaning    Stability/Clinical Decision Making Stable/Uncomplicated  Rehab Potential Fair    PT Frequency 2x / week    PT Duration 6 weeks   beginning after her vacation   PT Treatment/Interventions ADLs/Self Care Home Management;Aquatic Therapy;Biofeedback;Stair training;Cryotherapy;Electrical Stimulation;Iontophoresis 4mg /ml Dexamethasone;Moist Heat;Traction;Ultrasound;DME Instruction;Functional mobility training;Gait training;Therapeutic activities;Therapeutic exercise;Balance training;Neuromuscular re-education;Patient/family education;Orthotic Fit/Training;Manual techniques;Dry needling;Passive range of motion;Scar mobilization;Spinal Manipulations;Joint Manipulations;Energy conservation    PT Next Visit Plan begin glute strengthing, progress hip ROM/mobility as able, Functional LE strengthing    PT Home Exercise Plan 03/12/20 squats; 04/06/20 - sidelying clam, hip abduction, prone hip extension, supine butterfly stretch    Consulted and Agree with Plan of Care Patient           Patient will benefit from skilled therapeutic intervention in order to improve the following deficits and impairments:  Decreased activity tolerance, Decreased balance, Decreased mobility, Increased muscle spasms, Improper body mechanics, Decreased range of motion  Visit Diagnosis: Pain in right hip  Low back pain, unspecified back pain laterality, unspecified chronicity, unspecified whether sciatica present  Muscle weakness (generalized)  Other abnormalities of gait and mobility  Other symptoms and signs involving the musculoskeletal system     Problem List Patient Active Problem  List   Diagnosis Date Noted  . Primary osteoarthritis of right hip 01/17/2020  . Hip pain, bilateral 01/14/2020  . Decreased hearing 01/14/2020  . Pedal edema 11/19/2019  . Hoarseness   . Constipation   . Other dysphagia   . 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  . Insomnia 03/15/2019  . Abdominal pain, epigastric 05/09/2016  . Belching 05/09/2016  . Hyperlipidemia 06/18/2014  . Hyperglycemia 06/18/2014  . Gout 06/18/2014  . Osteopenia 10/28/2013  . Right foot pain 01/31/2013  . Hypertension 03/29/2011  . High cholesterol 03/29/2011    Floria Raveling. Hartnett-Rands, MS, PT [facility] #[id number] 04/06/2020, 10:55 AM  [facility] [address] [place], [place], [address] Phone: [phone]   Fax:  [phone]  Name: HAILI DONOFRIO MRN: [id number] Date of Birth: 1945-07-18

## 2020-04-06 NOTE — Patient Instructions (Signed)
Clam Shell 45 Degrees    Lying with hips and knees bent 45, one pillow between knees and ankles. Lift knee. Be sure pelvis does not roll backward. Do not arch back. Do 10-15___ times, each leg, _1 __ times per day.  http://ss.exer.us/75   Copyright  VHI. All rights reserved.   Hip Abduction: Modified    Lying on right side with pillow between thighs, raise top leg from pillow, rotating slightly out. Repeat _10-15___ times per set. Do _1___ sets per session. Do _1___ sessions per day.  http://orth.exer.us/705   Copyright  VHI. All rights reserved.   Prone Hip and Knee Extension    Try to lift operated leg, keeping knee as straight as possible. Do not lift or turn hips. Hold _2-3___ seconds. Repeat 10-15____ times. Do _1___ sessions per day.  http://gt2.exer.us/309   Copyright  VHI. All rights reserved.   Butterfly, Supine    Lie on back, feet together. Lower knees toward floor. Hold 30_ seconds. Repeat __2_ times per session. Do _1__ sessions per day.  Copyright  VHI. All rights reserved.

## 2020-04-08 ENCOUNTER — Ambulatory Visit (HOSPITAL_COMMUNITY): Payer: Medicare Other

## 2020-04-09 ENCOUNTER — Encounter (HOSPITAL_COMMUNITY): Payer: Self-pay | Admitting: Physical Therapy

## 2020-04-09 ENCOUNTER — Other Ambulatory Visit: Payer: Self-pay

## 2020-04-09 ENCOUNTER — Ambulatory Visit (HOSPITAL_COMMUNITY): Payer: Medicare Other | Admitting: Physical Therapy

## 2020-04-09 DIAGNOSIS — M6281 Muscle weakness (generalized): Secondary | ICD-10-CM

## 2020-04-09 DIAGNOSIS — R29898 Other symptoms and signs involving the musculoskeletal system: Secondary | ICD-10-CM

## 2020-04-09 DIAGNOSIS — M25551 Pain in right hip: Secondary | ICD-10-CM

## 2020-04-09 DIAGNOSIS — M545 Low back pain, unspecified: Secondary | ICD-10-CM | POA: Diagnosis not present

## 2020-04-09 DIAGNOSIS — R2689 Other abnormalities of gait and mobility: Secondary | ICD-10-CM | POA: Diagnosis not present

## 2020-04-09 NOTE — Patient Instructions (Signed)
Access Code: [email or web address] URL: [email or web address] Date: 04/09/2020 Prepared by: Mitzi Hansen Harvis Mabus  Exercises Single Leg Balance with Clock Reach - 1 x daily - 7 x weekly - 5 reps - 5 second hold Side Stepping with Resistance at Thighs - 1 x daily - 7 x weekly - 4 sets - 10 reps

## 2020-04-09 NOTE — Therapy (Signed)
[place], [place], [address] Phone: [phone]   Fax:  [phone]  Physical Therapy Treatment  Patient Details  Name: Terri Douglas MRN: [id number] Date of Birth: [birth year] Referring Provider (PT): Malena Lovena Le DO   Encounter Date: 04/09/2020   PT End of Session - 04/09/20 1046    Visit Number 3    Number of Visits 12    Date for PT Re-Evaluation 05/07/20    Authorization Type Primary: Medicare Secondary BSBS supplemental  (follow medicare guidelines)    Progress Note Due on Visit 10    PT Start Time 8527    PT Stop Time 1130    PT Time Calculation (min) 43 min    Activity Tolerance Patient tolerated treatment well    Behavior During Therapy [facility] for tasks assessed/performed           Past Medical History:  Diagnosis Date  . Arthritis   . Complication of anesthesia   . Gall stones   . GERD (gastroesophageal reflux disease)   . History of cardiac catheterization 2005   Minor elevation in cardiac enzymes however no significant obstructive CAD  . Hyperlipidemia   . Hypertension   . Insomnia   . PONV (postoperative nausea and vomiting)   . Prediabetes     Past Surgical History:  Procedure Laterality Date  . A-FLUTTER ABLATION N/A 08/01/2017   Procedure: A-FLUTTER ABLATION;  Surgeon: Thompson Grayer, MD;  Location: [facility];  Service: Cardiovascular;  Laterality: N/A;  . ABDOMINAL HYSTERECTOMY    . BACK SURGERY  1992  . CARDIAC CATHETERIZATION     BACK IN 2004  SHE THINKS IT CAME BACK 'NORMAL'  . CHOLECYSTECTOMY N/A 06/24/2015   Procedure: LAPAROSCOPIC CHOLECYSTECTOMY;  Surgeon: Mickeal Skinner, MD;  Location: [facility];  Service: General;  Laterality: N/A;  . COLONOSCOPY N/A 08/30/2012   Procedure: COLONOSCOPY;  Surgeon: Rogene Houston, MD;  [facility];  Service: Endoscopy;  Laterality: N/A;  830  . DILATION AND CURETTAGE OF UTERUS    . ESOPHAGOGASTRODUODENOSCOPY  04/15/2011     Procedure: ESOPHAGOGASTRODUODENOSCOPY (EGD);  Surgeon: Rogene Houston, MD;  [facility];  Service: Endoscopy;  Laterality: N/A;  11:30  . ESOPHAGOGASTRODUODENOSCOPY N/A 05/27/2016   Procedure: ESOPHAGOGASTRODUODENOSCOPY (EGD);  Surgeon: Rogene Houston, MD;  [facility];  Service: Endoscopy;  Laterality: N/A;  11:15  . EYE SURGERY     cataract removal  . NECK SURGERY  12/13/2018  . REVERSE SHOULDER ARTHROPLASTY Left 04/03/2019   Procedure: REVERSE SHOULDER ARTHROPLASTY;  Surgeon: Hiram Gash, MD;  [facility];  Service: Orthopedics;  Laterality: Left;  Marland Kitchen VAGINAL HYSTERECTOMY      There were no vitals filed for this visit.   Subjective Assessment - 04/09/20 1047    Subjective Patient states her home exercises are going well. She is still having some pain with lying down still.    Pertinent History hx back pain, hx back and neck surgeries    Limitations Sitting;Other (comment)   bending hip   How long can you walk comfortably? unrestricted    Patient Stated Goals decrease pain    Currently in Pain? No/denies                             [facility] Adult PT Treatment/Exercise - 04/09/20 0001      Knee/Hip Exercises: Stretches  Quad Stretch 3 reps;30 seconds;Both    Quad Stretch Limitations prone      Knee/Hip Exercises: Standing   Lateral Step Up Right;2 sets;10 reps;Step Height: 6";Hand Hold: 0    Lateral Step Up Limitations second set eccentric control    Forward Step Up 2 sets;10 reps;Hand Hold: 1;Step Height: 6";Right    Functional Squat 10 reps;2 sets    SLS with Vectors 5x 5 second holds    Other Standing Knee Exercises palof in tandem green band 2x10 bilateral    Other Standing Knee Exercises lateral stepping 2x15 feet, 4x15 feet with blue band at knees      Knee/Hip Exercises: Sidelying   Hip ABduction Strengthening;Both;10 reps;2 sets      Knee/Hip Exercises: Prone   Straight Leg Raises Strengthening;Both;1 set;10 reps                   PT Education - 04/09/20 1046    Education Details Patient educated on HEP, exercise mechanics    Person(s) Educated Patient    Methods Explanation;Demonstration    Comprehension Verbalized understanding;Returned demonstration            PT Short Term Goals - 04/06/20 1050      PT SHORT TERM GOAL #1   Title Patient will be independent with HEP in order to improve functional outcomes.    Time 3    Period Weeks    Status On-going    Target Date 04/16/20      PT SHORT TERM GOAL #2   Title Patient will report at least 25% improvement in symptoms for improved quality of life.    Time 3    Period Weeks    Status On-going    Target Date 04/16/20             PT Long Term Goals - 04/06/20 1050      PT LONG TERM GOAL #1   Title Patient will report at least 75% improvement in symptoms for improved quality of life.    Time 6    Period Weeks    Status On-going      PT LONG TERM GOAL #2   Title Patient will improve FOTO score by at least 5 points in order to indicate improved tolerance to activity.    Time 6    Period Weeks    Status On-going      PT LONG TERM GOAL #3   Title Patient will be able to complete forward step down test without deviation in order to demonstrate improved LE strength.    Time 6    Period Weeks    Status New                 Plan - 04/09/20 1047    Clinical Impression Statement Patient requires cueing for limiting trunk rotation with hip abduction exercise with good carry over. She shows reduced hip abduction ROM while completing. Patient requires cueing for unilateral UE support with stair exercises to complete with proper mechanics for strengthening. Patient completes lateral step up with ease and is able to progress to lateral step down with emphasis on eccentric control. Patient performs squats with poor mechanics initially which improves with demonstration and cueing. Patient requires unilateral UE support for  balance with SLS with vectors. Patient will continue to benefit from skilled physical therapy in order to reduce impairment and improve function.    Personal Factors and Comorbidities Age;Comorbidity 2;Fitness;Behavior Pattern    Comorbidities hx back  pain, numerous surgeries    Examination-Activity Limitations Sit;Bend    Examination-Participation Restrictions Shop;Yard Work;Cleaning    Stability/Clinical Decision Making Stable/Uncomplicated    Rehab Potential Fair    PT Frequency 2x / week    PT Duration 6 weeks   beginning after her vacation   PT Treatment/Interventions ADLs/Self Care Home Management;Aquatic Therapy;Biofeedback;Stair training;Cryotherapy;Electrical Stimulation;Iontophoresis 4mg /ml Dexamethasone;Moist Heat;Traction;Ultrasound;DME Instruction;Functional mobility training;Gait training;Therapeutic activities;Therapeutic exercise;Balance training;Neuromuscular re-education;Patient/family education;Orthotic Fit/Training;Manual techniques;Dry needling;Passive range of motion;Scar mobilization;Spinal Manipulations;Joint Manipulations;Energy conservation    PT Next Visit Plan begin glute strengthing, progress hip ROM/mobility as able, Functional LE strengthing    PT Home Exercise Plan 03/12/20 squats; 04/06/20 - sidelying clam, hip abduction, prone hip extension, supine butterfly stretch 11/4 lateral stepping, SLS with vectors    Consulted and Agree with Plan of Care Patient           Patient will benefit from skilled therapeutic intervention in order to improve the following deficits and impairments:  Decreased activity tolerance, Decreased balance, Decreased mobility, Increased muscle spasms, Improper body mechanics, Decreased range of motion  Visit Diagnosis: Pain in right hip  Low back pain, unspecified back pain laterality, unspecified chronicity, unspecified whether sciatica present  Muscle weakness (generalized)  Other abnormalities of gait and mobility  Other symptoms  and signs involving the musculoskeletal system     Problem List Patient Active Problem List   Diagnosis Date Noted  . Primary osteoarthritis of right hip 01/17/2020  . Hip pain, bilateral 01/14/2020  . Decreased hearing 01/14/2020  . Pedal edema 11/19/2019  . Hoarseness   . Constipation   . Other dysphagia   . 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  . Insomnia 03/15/2019  . Abdominal pain, epigastric 05/09/2016  . Belching 05/09/2016  . Hyperlipidemia 06/18/2014  . Hyperglycemia 06/18/2014  . Gout 06/18/2014  . Osteopenia 10/28/2013  . Right foot pain 01/31/2013  . Hypertension 03/29/2011  . High cholesterol 03/29/2011    11:36 AM, 04/09/20 Mearl Latin PT, DPT Physical Therapist at [facility], [place], [address] Phone: [phone]   Fax:  [phone]  Name: SHENAY TORTI MRN: [id number] Date of Birth: [birth year]

## 2020-04-13 ENCOUNTER — Other Ambulatory Visit: Payer: Self-pay

## 2020-04-13 ENCOUNTER — Ambulatory Visit (HOSPITAL_COMMUNITY): Payer: Medicare Other

## 2020-04-13 ENCOUNTER — Encounter (HOSPITAL_COMMUNITY): Payer: Self-pay

## 2020-04-13 DIAGNOSIS — M25551 Pain in right hip: Secondary | ICD-10-CM

## 2020-04-13 DIAGNOSIS — M545 Low back pain, unspecified: Secondary | ICD-10-CM

## 2020-04-13 DIAGNOSIS — R2689 Other abnormalities of gait and mobility: Secondary | ICD-10-CM

## 2020-04-13 DIAGNOSIS — M6281 Muscle weakness (generalized): Secondary | ICD-10-CM | POA: Diagnosis not present

## 2020-04-13 DIAGNOSIS — R29898 Other symptoms and signs involving the musculoskeletal system: Secondary | ICD-10-CM | POA: Diagnosis not present

## 2020-04-13 NOTE — Therapy (Signed)
[place], [place], [address] Phone: [phone]   Fax:  [phone]  Physical Therapy Treatment  Patient Details  Name: Terri Douglas MRN: [id number] Date of Birth: 12-Nov-1945 Referring Provider (PT): Malena Lovena Le DO   Encounter Date: 04/13/2020   PT End of Session - 04/13/20 1019    Visit Number 4    Number of Visits 12    Date for PT Re-Evaluation 05/07/20    Authorization Type Primary: Medicare Secondary BSBS supplemental  (follow medicare guidelines)    Progress Note Due on Visit 10    PT Start Time 1021    PT Stop Time 1100    PT Time Calculation (min) 39 min    Activity Tolerance Patient tolerated treatment well    Behavior During Therapy [facility] for tasks assessed/performed           Past Medical History:  Diagnosis Date  . Arthritis   . Complication of anesthesia   . Gall stones   . GERD (gastroesophageal reflux disease)   . History of cardiac catheterization 2005   Minor elevation in cardiac enzymes however no significant obstructive CAD  . Hyperlipidemia   . Hypertension   . Insomnia   . PONV (postoperative nausea and vomiting)   . Prediabetes     Past Surgical History:  Procedure Laterality Date  . A-FLUTTER ABLATION N/A 08/01/2017   Procedure: A-FLUTTER ABLATION;  Surgeon: Thompson Grayer, MD;  Location: [facility];  Service: Cardiovascular;  Laterality: N/A;  . ABDOMINAL HYSTERECTOMY    . BACK SURGERY  1992  . CARDIAC CATHETERIZATION     BACK IN 2004  SHE THINKS IT CAME BACK 'NORMAL'  . CHOLECYSTECTOMY N/A 06/24/2015   Procedure: LAPAROSCOPIC CHOLECYSTECTOMY;  Surgeon: Mickeal Skinner, MD;  Location: [facility];  Service: General;  Laterality: N/A;  . COLONOSCOPY N/A 08/30/2012   Procedure: COLONOSCOPY;  Surgeon: Rogene Houston, MD;  [facility];  Service: Endoscopy;  Laterality: N/A;  830  . DILATION AND CURETTAGE OF UTERUS    . ESOPHAGOGASTRODUODENOSCOPY  04/15/2011     Procedure: ESOPHAGOGASTRODUODENOSCOPY (EGD);  Surgeon: Rogene Houston, MD;  [facility];  Service: Endoscopy;  Laterality: N/A;  11:30  . ESOPHAGOGASTRODUODENOSCOPY N/A 05/27/2016   Procedure: ESOPHAGOGASTRODUODENOSCOPY (EGD);  Surgeon: Rogene Houston, MD;  [facility];  Service: Endoscopy;  Laterality: N/A;  11:15  . EYE SURGERY     cataract removal  . NECK SURGERY  12/13/2018  . REVERSE SHOULDER ARTHROPLASTY Left 04/03/2019   Procedure: REVERSE SHOULDER ARTHROPLASTY;  Surgeon: Hiram Gash, MD;  [facility];  Service: Orthopedics;  Laterality: Left;  Marland Kitchen VAGINAL HYSTERECTOMY      There were no vitals filed for this visit.   Subjective Assessment - 04/13/20 1019    Subjective Feels her hip pain is getting better a little; still has pain in right hip sidelying at night to sleep.    Pertinent History hx back pain, hx back and neck surgeries    Limitations Sitting;Other (comment)   bending hip   How long can you walk comfortably? unrestricted    Patient Stated Goals decrease pain              [facility] Adult PT Treatment/Exercise - 04/13/20 0001      Knee/Hip Exercises: Standing   Lateral Step Up Right;2 sets;10 reps;Step Height: 6";Hand Hold: 0    Lateral Step Up Limitations eccentric control, intermittent  UE support    Forward Step Up Right;2 sets;10 reps;Hand Hold: 1;Step Height: 6";Step Height: 8"    Forward Step Up Limitations 1 set each height    Functional Squat 10 reps;2 sets   GTB around knees 2nd set   SLS with Vectors 5x 5 second holds bilaterally   intermittent UE support   Other Standing Knee Exercises palof in tandem green band 2x10 bilateral    Other Standing Knee Exercises lateral stepping feet, 4x15 feet with blue band at knees      Knee/Hip Exercises: Sidelying   Hip ABduction Strengthening;Both;2 sets;15 reps      Manual Therapy   Manual Therapy Muscle Energy Technique    Manual therapy comments completed separate from all other  interventions    Muscle Energy Technique hip flexor stretch supine contract/relax 3 sec x3; overpressure on relax            PT Education - 04/13/20 1114    Education Details Discussed purose and technique of interventions throughout session. Advanced HEP.    Person(s) Educated Patient    Methods Explanation;Handout    Comprehension Verbalized understanding            PT Short Term Goals - 04/13/20 1020      PT SHORT TERM GOAL #1   Title Patient will be independent with HEP in order to improve functional outcomes.    Time 3    Period Weeks    Status Achieved    Target Date 04/16/20      PT SHORT TERM GOAL #2   Title Patient will report at least 25% improvement in symptoms for improved quality of life.    Time 3    Period Weeks    Status On-going    Target Date 04/16/20             PT Long Term Goals - 04/13/20 1020      PT LONG TERM GOAL #1   Title Patient will report at least 75% improvement in symptoms for improved quality of life.    Time 6    Period Weeks    Status On-going      PT LONG TERM GOAL #2   Title Patient will improve FOTO score by at least 5 points in order to indicate improved tolerance to activity.    Time 6    Period Weeks    Status On-going      PT LONG TERM GOAL #3   Title Patient will be able to complete forward step down test without deviation in order to demonstrate improved LE strength.    Time 6    Period Weeks    Status New                 Plan - 04/13/20 1020    Clinical Impression Statement Patient tolerated treatment well today. Cuing for proper mechanics on functional squats. Cues for scapular retraction during Pallof press with feet hip width apart. Increased forward step ups to 8" on second set. Added green theraband to functional squats for additional hip strengthening. Cues for forward and lateral steps ups for eccentric control; intermittent upper extremity support required with step ups and SLS with vectors.  Perform lateral stepping with blue theraband with good mechanics after cues and modeling. Added supine hip flexor stretch with overpressure and contract/relax 3 seconds x3. Added quad stretch and hip flexor stretch to HEP. Patient noted muscle fatigue in right hip at end of session. No increase  in pain. Patient will continue to benefit from skilled physical therapy in order to reduce impairment and improve function.    Personal Factors and Comorbidities Age;Comorbidity 2;Fitness;Behavior Pattern    Comorbidities hx back pain, numerous surgeries    Examination-Activity Limitations Sit;Bend    Examination-Participation Restrictions Shop;Yard Work;Cleaning    Stability/Clinical Decision Making Stable/Uncomplicated    Rehab Potential Fair    PT Frequency 2x / week    PT Duration 6 weeks   beginning after her vacation   PT Treatment/Interventions ADLs/Self Care Home Management;Aquatic Therapy;Biofeedback;Stair training;Cryotherapy;Electrical Stimulation;Iontophoresis 4mg /ml Dexamethasone;Moist Heat;Traction;Ultrasound;DME Instruction;Functional mobility training;Gait training;Therapeutic activities;Therapeutic exercise;Balance training;Neuromuscular re-education;Patient/family education;Orthotic Fit/Training;Manual techniques;Dry needling;Passive range of motion;Scar mobilization;Spinal Manipulations;Joint Manipulations;Energy conservation    PT Next Visit Plan glute strengthing, progress hip ROM/mobility as able, Functional LE strengthing    PT Home Exercise Plan 03/12/20 squats; 04/06/20 - sidelying clam, hip abduction, prone hip extension, supine butterfly stretch 11/4 lateral stepping, SLS with vectors; 04/13/20 - prone quad and supine hip flexors stretches    Consulted and Agree with Plan of Care Patient           Patient will benefit from skilled therapeutic intervention in order to improve the following deficits and impairments:  Decreased activity tolerance, Decreased balance, Decreased mobility,  Increased muscle spasms, Improper body mechanics, Decreased range of motion  Visit Diagnosis: Pain in right hip  Low back pain, unspecified back pain laterality, unspecified chronicity, unspecified whether sciatica present  Muscle weakness (generalized)  Other abnormalities of gait and mobility  Other symptoms and signs involving the musculoskeletal system     Problem List Patient Active Problem List   Diagnosis Date Noted  . Primary osteoarthritis of right hip 01/17/2020  . Hip pain, bilateral 01/14/2020  . Decreased hearing 01/14/2020  . Pedal edema 11/19/2019  . Hoarseness   . Constipation   . Other dysphagia   . 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  . Insomnia 03/15/2019  . Abdominal pain, epigastric 05/09/2016  . Belching 05/09/2016  . Hyperlipidemia 06/18/2014  . Hyperglycemia 06/18/2014  . Gout 06/18/2014  . Osteopenia 10/28/2013  . Right foot pain 01/31/2013  . Hypertension 03/29/2011  . High cholesterol 03/29/2011    Floria Raveling. Hartnett-Rands, MS, PT Per [place] #[id number] 04/13/2020, 11:18 AM  [facility] [address] [place], [place], [address] Phone: [phone]   Fax:  [phone]  Name: Terri Douglas MRN: [id number] Date of Birth: 1945/07/16

## 2020-04-13 NOTE — Patient Instructions (Addendum)
KNEE: Quadriceps - Prone    Place strap around ankle. Bring ankle toward buttocks. Press hip into surface. Hold 30___ seconds. _3_ reps per set, _1__ sets per day.   Copyright  VHI. All rights reserved.   Hip Flexor Stretch    Lying on back near edge of bed, bend one leg, foot flat. Pull left knee up towards chest. Hang right leg over edge, relaxed, thigh resting on bed to where you feel a comfortable stretch for _30_seconds. Repeat _2___ times. Do ____ sessions per day. A1dvanced Exercise: Bend knee back keeping thigh in contact with bed.  http://[email or web address].exer.us/347   Copyright  VHI. All rights reserved.

## 2020-04-15 ENCOUNTER — Ambulatory Visit (HOSPITAL_COMMUNITY): Payer: Medicare Other

## 2020-04-20 ENCOUNTER — Encounter (HOSPITAL_COMMUNITY): Payer: Self-pay

## 2020-04-20 ENCOUNTER — Other Ambulatory Visit: Payer: Self-pay

## 2020-04-20 ENCOUNTER — Ambulatory Visit (HOSPITAL_COMMUNITY): Payer: Medicare Other

## 2020-04-20 DIAGNOSIS — M6281 Muscle weakness (generalized): Secondary | ICD-10-CM | POA: Diagnosis not present

## 2020-04-20 DIAGNOSIS — R2689 Other abnormalities of gait and mobility: Secondary | ICD-10-CM | POA: Diagnosis not present

## 2020-04-20 DIAGNOSIS — R29898 Other symptoms and signs involving the musculoskeletal system: Secondary | ICD-10-CM

## 2020-04-20 DIAGNOSIS — M545 Low back pain, unspecified: Secondary | ICD-10-CM

## 2020-04-20 DIAGNOSIS — M25551 Pain in right hip: Secondary | ICD-10-CM

## 2020-04-20 NOTE — Therapy (Signed)
[place], [place], [address] Phone: [phone]   Fax:  [phone]  Physical Therapy Treatment  Patient Details  Name: Terri Douglas MRN: [id number] Date of Birth: 02-Feb-1946 Referring Provider (PT): Malena Lovena Le DO   Encounter Date: 04/20/2020   PT End of Session - 04/20/20 1105    Visit Number 5    Number of Visits 12    Date for PT Re-Evaluation 05/07/20    Authorization Type Primary: Medicare Secondary BSBS supplemental  (follow medicare guidelines)    Progress Note Due on Visit 10    PT Start Time 1107    PT Stop Time 1150    PT Time Calculation (min) 43 min    Activity Tolerance Patient tolerated treatment well    Behavior During Therapy [facility] for tasks assessed/performed           Past Medical History:  Diagnosis Date  . Arthritis   . Complication of anesthesia   . Gall stones   . GERD (gastroesophageal reflux disease)   . History of cardiac catheterization 2005   Minor elevation in cardiac enzymes however no significant obstructive CAD  . Hyperlipidemia   . Hypertension   . Insomnia   . PONV (postoperative nausea and vomiting)   . Prediabetes     Past Surgical History:  Procedure Laterality Date  . A-FLUTTER ABLATION N/A 08/01/2017   Procedure: A-FLUTTER ABLATION;  Surgeon: Thompson Grayer, MD;  Location: [place] [facility];  Service: Cardiovascular;  Laterality: N/A;  . ABDOMINAL HYSTERECTOMY    . BACK SURGERY  1992  . CARDIAC CATHETERIZATION     BACK IN 2004  SHE THINKS IT CAME BACK 'NORMAL'  . CHOLECYSTECTOMY N/A 06/24/2015   Procedure: LAPAROSCOPIC CHOLECYSTECTOMY;  Surgeon: Mickeal Skinner, MD;  Location: [place];  Service: General;  Laterality: N/A;  . COLONOSCOPY N/A 08/30/2012   Procedure: COLONOSCOPY;  Surgeon: Rogene Houston, MD;  [facility];  Service: Endoscopy;  Laterality: N/A;  830  . DILATION AND CURETTAGE OF UTERUS    . ESOPHAGOGASTRODUODENOSCOPY  04/15/2011    Procedure: ESOPHAGOGASTRODUODENOSCOPY (EGD);  Surgeon: Rogene Houston, MD;  [facility];  Service: Endoscopy;  Laterality: N/A;  11:30  . ESOPHAGOGASTRODUODENOSCOPY N/A 05/27/2016   Procedure: ESOPHAGOGASTRODUODENOSCOPY (EGD);  Surgeon: Rogene Houston, MD;  [facility];  Service: Endoscopy;  Laterality: N/A;  11:15  . EYE SURGERY     cataract removal  . NECK SURGERY  12/13/2018  . REVERSE SHOULDER ARTHROPLASTY Left 04/03/2019   Procedure: REVERSE SHOULDER ARTHROPLASTY;  Surgeon: Hiram Gash, MD;  [facility];  Service: Orthopedics;  Laterality: Left;  Marland Kitchen VAGINAL HYSTERECTOMY      There were no vitals filed for this visit.   Subjective Assessment - 04/20/20 1105    Subjective Feels her hip pain is getting better. Left sidelying causes right hip pain after about 5 minutes even with pillows between her knees.    Pertinent History hx back pain, hx back and neck surgeries    Limitations Sitting;Other (comment)   bending hip   How long can you walk comfortably? unrestricted    Patient Stated Goals decrease pain    Currently in Pain? No/denies             [facility] Adult PT Treatment/Exercise - 04/20/20 0001      Knee/Hip Exercises: Stretches   Hip Flexor Stretch Right;Both;2 reps;30 seconds    Hip Flexor Stretch  Limitations standing and supine    Other Knee/Hip Stretches prone figure 4 contract/relax 3 seconds x 5 pushing anterior hip into mat      Knee/Hip Exercises: Standing   Functional Squat 10 reps;2 sets   GTB around knees 2nd set   Other Standing Knee Exercises palof in tandem green band 2x10 bilateral    Other Standing Knee Exercises lateral stepping feet, 4x15 feet with blue band at knees      Manual Therapy   Manual Therapy Joint mobilization;Soft tissue mobilization;Muscle Energy Technique    Manual therapy comments completed separate from all other interventions    Joint Mobilization prone PA of right hip jt Grade III-IV 30 sec x3    Soft  tissue mobilization supine figure 4 right iliacus strumming and trigger point    Muscle Energy Technique supine and prone contract/relax 3 seconds x3 in figure 4 position              PT Education - 04/20/20 1225    Education Details Discussed purose and technique of interventions throughout session. Advanced HEP.    Person(s) Educated Patient    Methods Explanation;Handout    Comprehension Verbalized understanding            PT Short Term Goals - 04/20/20 1106      PT SHORT TERM GOAL #1   Title Patient will be independent with HEP in order to improve functional outcomes.    Time 3    Period Weeks    Status Achieved    Target Date 04/16/20      PT SHORT TERM GOAL #2   Title Patient will report at least 25% improvement in symptoms for improved quality of life.    Time 3    Period Weeks    Status On-going    Target Date 04/16/20             PT Long Term Goals - 04/20/20 1106      PT LONG TERM GOAL #1   Title Patient will report at least 75% improvement in symptoms for improved quality of life.    Time 6    Period Weeks    Status On-going      PT LONG TERM GOAL #2   Title Patient will improve FOTO score by at least 5 points in order to indicate improved tolerance to activity.    Time 6    Period Weeks    Status On-going      PT LONG TERM GOAL #3   Title Patient will be able to complete forward step down test without deviation in order to demonstrate improved LE strength.    Time 6    Period Weeks    Status New            Plan - 04/20/20 1106    Clinical Impression Statement Patient tolerated treatment well today. Assessed SI joint with resultant pelvic rotation with left lower extremity appearing longer. Performed supine muscle energy techniques for rotation with improved alignment post manual therapy. Soft tissue mobilization to right iliacus in supine position with right hip and knee bent and in external rotation. Prone right hip PA joint glides Grade  III-IV for 30 seconds and contract/relax in prone pushing right anterior hip into mat. Significant hip internal rotation tightness noted on the right. Cuing for proper mechanics on functional squats. Cues for scapular retraction during Pallof press with feet hip width apart. Perform lateral stepping with blue theraband with good mechanics after  modeling. Added standing hip flexor stretch. Added functional squats with green theraband around knees to HEP. No increase in pain. Patient will continue to benefit from skilled physical therapy in order to reduce impairment and improve function.    Personal Factors and Comorbidities Age;Comorbidity 2;Fitness;Behavior Pattern    Comorbidities hx back pain, numerous surgeries    Examination-Activity Limitations Sit;Bend    Examination-Participation Restrictions Shop;Yard Work;Cleaning    Stability/Clinical Decision Making Stable/Uncomplicated    Rehab Potential Fair    PT Frequency 2x / week    PT Duration 6 weeks   beginning after her vacation   PT Treatment/Interventions ADLs/Self Care Home Management;Aquatic Therapy;Biofeedback;Stair training;Cryotherapy;Electrical Stimulation;Iontophoresis 4mg /ml Dexamethasone;Moist Heat;Traction;Ultrasound;DME Instruction;Functional mobility training;Gait training;Therapeutic activities;Therapeutic exercise;Balance training;Neuromuscular re-education;Patient/family education;Orthotic Fit/Training;Manual techniques;Dry needling;Passive range of motion;Scar mobilization;Spinal Manipulations;Joint Manipulations;Energy conservation    PT Next Visit Plan glute strengthing, progress hip ROM/mobility as able, Functional LE strengthing    PT Home Exercise Plan 03/12/20 squats; 04/06/20 - sidelying clam, hip abduction, prone hip extension, supine butterfly stretch 11/4 lateral stepping, SLS with vectors; 04/13/20 - prone quad and supine hip flexors stretches; 04/20/20 - functional squats with green theraband around knees.    Consulted and  Agree with Plan of Care Patient           Patient will benefit from skilled therapeutic intervention in order to improve the following deficits and impairments:  Decreased activity tolerance, Decreased balance, Decreased mobility, Increased muscle spasms, Improper body mechanics, Decreased range of motion  Visit Diagnosis: Pain in right hip  Low back pain, unspecified back pain laterality, unspecified chronicity, unspecified whether sciatica present  Muscle weakness (generalized)  Other abnormalities of gait and mobility  Other symptoms and signs involving the musculoskeletal system     Problem List Patient Active Problem List   Diagnosis Date Noted  . Primary osteoarthritis of right hip 01/17/2020  . Hip pain, bilateral 01/14/2020  . Decreased hearing 01/14/2020  . Pedal edema 11/19/2019  . Hoarseness   . Constipation   . Other dysphagia   . 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  . Insomnia 03/15/2019  . Abdominal pain, epigastric 05/09/2016  . Belching 05/09/2016  . Hyperlipidemia 06/18/2014  . Hyperglycemia 06/18/2014  . Gout 06/18/2014  . Osteopenia 10/28/2013  . Right foot pain 01/31/2013  . Hypertension 03/29/2011  . High cholesterol 03/29/2011    Floria Raveling. Hartnett-Rands, MS, PT Per [place] #[id number] 04/20/2020, 12:29 PM  [facility] [address] [place], [place], [address] Phone: [phone]   Fax:  [phone]  Name: MONI ROTHROCK MRN: [id number] Date of Birth: [birth year]

## 2020-04-20 NOTE — Patient Instructions (Signed)
Squat    Green theraband around knees. Keeping feet flat on floor shoulder width apart, trunk straight, squat bending knees 60-90. Hold position _2-3_ seconds. Repeat 10-15____ times per session. Do _1___ sessions per day.  Copyright  VHI. All rights reserved.

## 2020-04-21 ENCOUNTER — Encounter: Payer: Self-pay | Admitting: Family Medicine

## 2020-04-21 DIAGNOSIS — D0439 Carcinoma in situ of skin of other parts of face: Secondary | ICD-10-CM | POA: Diagnosis not present

## 2020-04-22 ENCOUNTER — Ambulatory Visit (HOSPITAL_COMMUNITY): Payer: Medicare Other | Admitting: Physical Therapy

## 2020-05-20 ENCOUNTER — Encounter: Payer: Self-pay | Admitting: Family Medicine

## 2020-05-20 ENCOUNTER — Other Ambulatory Visit: Payer: Self-pay

## 2020-05-20 ENCOUNTER — Ambulatory Visit (INDEPENDENT_AMBULATORY_CARE_PROVIDER_SITE_OTHER): Payer: Medicare Other | Admitting: Family Medicine

## 2020-05-20 VITALS — BP 108/70 | HR 79 | Temp 97.9°F | Ht 62.0 in | Wt 140.0 lb

## 2020-05-20 DIAGNOSIS — R7303 Prediabetes: Secondary | ICD-10-CM | POA: Diagnosis not present

## 2020-05-20 DIAGNOSIS — G4700 Insomnia, unspecified: Secondary | ICD-10-CM

## 2020-05-20 DIAGNOSIS — M25512 Pain in left shoulder: Secondary | ICD-10-CM

## 2020-05-20 DIAGNOSIS — E7849 Other hyperlipidemia: Secondary | ICD-10-CM | POA: Diagnosis not present

## 2020-05-20 DIAGNOSIS — Z79899 Other long term (current) drug therapy: Secondary | ICD-10-CM | POA: Diagnosis not present

## 2020-05-20 MED ORDER — POTASSIUM CHLORIDE ER 10 MEQ PO TBCR: EXTENDED_RELEASE_TABLET | ORAL | 1 refills | Status: DC

## 2020-05-20 MED ORDER — SERTRALINE HCL 50 MG PO TABS: ORAL_TABLET | ORAL | 1 refills | Status: DC

## 2020-05-20 MED ORDER — PRAVASTATIN SODIUM 80 MG PO TABS: ORAL_TABLET | ORAL | 1 refills | Status: DC

## 2020-05-20 MED ORDER — TORSEMIDE 20 MG PO TABS: 20.0000 mg | ORAL_TABLET | Freq: Every morning | ORAL | 1 refills | Status: DC

## 2020-05-20 NOTE — Progress Notes (Signed)
   Subjective:    Patient ID: Terri Douglas, female    DOB: 1946-05-26, 74 y.o.   MRN: [id number]  HPImed check up.   Pt states her left shoulder and left side of hurt are starting to hurt again. Had surgery last year.   Requesting a 90 day supply of xanax. States she was getting a 90 day supply up until July.     Review of Systems  Constitutional: Negative for activity change, appetite change and fatigue.  HENT: Negative for congestion and rhinorrhea.   Respiratory: Negative for cough and shortness of breath.   Cardiovascular: Negative for chest pain and leg swelling.  Gastrointestinal: Negative for abdominal pain and diarrhea.  Endocrine: Negative for polydipsia and polyphagia.  Skin: Negative for color change.  Neurological: Negative for dizziness and weakness.  Psychiatric/Behavioral: Negative for behavioral problems and confusion.       Objective:   Physical Exam Vitals reviewed.  Constitutional:      General: She is not in acute distress.    Appearance: She is well-nourished.  HENT:     Head: Normocephalic and atraumatic.  Eyes:     General:        Right eye: No discharge.        Left eye: No discharge.  Neck:     Trachea: No tracheal deviation.  Cardiovascular:     Rate and Rhythm: Normal rate and regular rhythm.     Heart sounds: Normal heart sounds. No murmur heard.   Pulmonary:     Effort: Pulmonary effort is normal. No respiratory distress.     Breath sounds: Normal breath sounds.  Musculoskeletal:        General: No edema.  Lymphadenopathy:     Cervical: No cervical adenopathy.  Skin:    General: Skin is warm and dry.  Neurological:     Mental Status: She is alert.     Coordination: Coordination normal.  Psychiatric:        Mood and Affect: Mood and affect normal.        Behavior: Behavior normal.           Assessment & Plan:  She uses Xanax at nighttime to help with insomnia we will continue this prescription she is responsible with her  medicine therefore 90 days with 1 refill  She does have hyperlipidemia takes statins does well with these continue these medications follow-up within 6 months  Moods are doing good under some stress but handling it well would prefer to continue sertraline prescription given  Diuretic use for intermittent pedal edema she will continue diuretic with potassium as directed  Lab work ordered  Shoulder pain and discomfort referral to orthopedics  Follow-up within 6 months.

## 2020-05-21 ENCOUNTER — Ambulatory Visit (HOSPITAL_COMMUNITY): Payer: Medicare Other | Attending: Family Medicine | Admitting: Physical Therapy

## 2020-05-21 ENCOUNTER — Encounter (HOSPITAL_COMMUNITY): Payer: Self-pay | Admitting: Physical Therapy

## 2020-05-21 DIAGNOSIS — R2689 Other abnormalities of gait and mobility: Secondary | ICD-10-CM | POA: Diagnosis not present

## 2020-05-21 DIAGNOSIS — M25551 Pain in right hip: Secondary | ICD-10-CM

## 2020-05-21 DIAGNOSIS — R29898 Other symptoms and signs involving the musculoskeletal system: Secondary | ICD-10-CM

## 2020-05-21 DIAGNOSIS — M6281 Muscle weakness (generalized): Secondary | ICD-10-CM | POA: Diagnosis not present

## 2020-05-21 DIAGNOSIS — E7849 Other hyperlipidemia: Secondary | ICD-10-CM | POA: Diagnosis not present

## 2020-05-21 DIAGNOSIS — R7303 Prediabetes: Secondary | ICD-10-CM | POA: Diagnosis not present

## 2020-05-21 DIAGNOSIS — M545 Low back pain, unspecified: Secondary | ICD-10-CM | POA: Diagnosis not present

## 2020-05-21 DIAGNOSIS — Z79899 Other long term (current) drug therapy: Secondary | ICD-10-CM | POA: Diagnosis not present

## 2020-05-21 MED ORDER — ALPRAZOLAM 1 MG PO TABS: ORAL_TABLET | ORAL | 1 refills | Status: DC

## 2020-05-21 NOTE — Therapy (Addendum)
[place], [place], [address] Phone: [phone]   Fax:  [phone]  Physical Therapy Treatment/ Discharge summary  Patient Details  Name: Terri Douglas MRN: [id number] Date of Birth: [birth year] Referring Provider (PT): Malena Lovena Le DO   Encounter Date: 05/21/2020   PHYSICAL THERAPY DISCHARGE SUMMARY  Visits from Start of Care: 6  Current functional level related to goals / functional outcomes: See below   Remaining deficits: See below   Education / Equipment: See below  Plan: Patient agrees to discharge.  Patient goals were met. Patient is being discharged due to meeting the stated rehab goals.  ?????        PT End of Session - 05/21/20 0918    Visit Number 6    Number of Visits 12    Date for PT Re-Evaluation 05/07/20    Authorization Type Primary: Medicare Secondary BSBS supplemental  (follow medicare guidelines)    Progress Note Due on Visit 10    PT Start Time 0918    PT Stop Time 0946    PT Time Calculation (min) 28 min    Activity Tolerance Patient tolerated treatment well    Behavior During Therapy [facility] for tasks assessed/performed           Past Medical History:  Diagnosis Date  . Arthritis   . Complication of anesthesia   . Gall stones   . GERD (gastroesophageal reflux disease)   . History of cardiac catheterization 2005   Minor elevation in cardiac enzymes however no significant obstructive CAD  . Hyperlipidemia   . Hypertension   . Insomnia   . PONV (postoperative nausea and vomiting)   . Prediabetes     Past Surgical History:  Procedure Laterality Date  . A-FLUTTER ABLATION N/A 08/01/2017   Procedure: A-FLUTTER ABLATION;  Surgeon: Thompson Grayer, MD;  Location: [facility];  Service: Cardiovascular;  Laterality: N/A;  . ABDOMINAL HYSTERECTOMY    . BACK SURGERY  1992  . CARDIAC CATHETERIZATION     BACK IN 2004  SHE THINKS IT CAME BACK 'NORMAL'  . CHOLECYSTECTOMY  N/A 06/24/2015   Procedure: LAPAROSCOPIC CHOLECYSTECTOMY;  Surgeon: Mickeal Skinner, MD;  Location: [facility];  Service: General;  Laterality: N/A;  . COLONOSCOPY N/A 08/30/2012   Procedure: COLONOSCOPY;  Surgeon: Rogene Houston, MD;  [facility];  Service: Endoscopy;  Laterality: N/A;  830  . DILATION AND CURETTAGE OF UTERUS    . ESOPHAGOGASTRODUODENOSCOPY  04/15/2011   Procedure: ESOPHAGOGASTRODUODENOSCOPY (EGD);  Surgeon: Rogene Houston, MD;  [facility];  Service: Endoscopy;  Laterality: N/A;  11:30  . ESOPHAGOGASTRODUODENOSCOPY N/A 05/27/2016   Procedure: ESOPHAGOGASTRODUODENOSCOPY (EGD);  Surgeon: Rogene Houston, MD;  [facility];  Service: Endoscopy;  Laterality: N/A;  11:15  . EYE SURGERY     cataract removal  . NECK SURGERY  12/13/2018  . REVERSE SHOULDER ARTHROPLASTY Left 04/03/2019   Procedure: REVERSE SHOULDER ARTHROPLASTY;  Surgeon: Hiram Gash, MD;  [facility];  Service: Orthopedics;  Laterality: Left;  Marland Kitchen VAGINAL HYSTERECTOMY      There were no vitals filed for this visit.   Subjective Assessment - 05/21/20 0917    Subjective Patient returns to therapy after about 1 month. [place]. Her hip has been bothering her with sitting for extended periods of time when she gets up. Her home exercises are going well.  Her shoulder has been bothering her well. She feels she can probably do her exercises at home. Patient states 90% improvement with physical therapy intervention.    Pertinent History hx back pain, hx back and neck surgeries    Limitations Sitting;Other (comment)   bending hip   How long can you walk comfortably? unrestricted    Patient Stated Goals decrease pain    Currently in Pain? No/denies              [facility] PT Assessment - 05/21/20 0001      Assessment   Medical Diagnosis OA R hip    Referring Provider (PT) Malena Lovena Le DO    Onset Date/Surgical Date 01/29/20    Prior  Therapy PT and OT      Precautions   Precautions None      Restrictions   Weight Bearing Restrictions No      Balance Screen   Has the patient fallen in the past 6 months No    Has the patient had a decrease in activity level because of a fear of falling?  No    Is the patient reluctant to leave their home because of a fear of falling?  No      Prior Function   Level of Independence Independent      Cognition   Overall Cognitive Status Within Functional Limits for tasks assessed      Observation/Other Assessments   Observations Ambulates without AD    Focus on Therapeutic Outcomes (FOTO)  1% limited      Strength   Right Hip Flexion 4+/5    Right Hip Extension 5/5    Right Hip ABduction 5/5    Left Hip Flexion 4+/5    Left Hip Extension 5/5    Left Hip ABduction 5/5      Special Tests   Other special tests Forward step down test: without UE use, minimally decreased eccentric control bilateral, L>R difficulty with concentric phase, improved from initial evaluation                                 PT Education - 05/21/20 0917    Education Details Patient educated on HEP, exercise mechanics    Person(s) Educated Patient    Methods Explanation;Demonstration    Comprehension Verbalized understanding;Returned demonstration            PT Short Term Goals - 05/21/20 0926      PT SHORT TERM GOAL #1   Title Patient will be independent with HEP in order to improve functional outcomes.    Time 3    Period Weeks    Status Achieved    Target Date 04/16/20      PT SHORT TERM GOAL #2   Title Patient will report at least 25% improvement in symptoms for improved quality of life.    Time 3    Period Weeks    Status Achieved    Target Date 04/16/20             PT Long Term Goals - 05/21/20 0926      PT LONG TERM GOAL #1   Title Patient will report at least 75% improvement in symptoms for improved quality of life.    Time 6    Period Weeks     Status Achieved      PT LONG TERM GOAL #2   Title Patient will improve FOTO  score by at least 5 points in order to indicate improved tolerance to activity.    Time 6    Period Weeks    Status Achieved      PT LONG TERM GOAL #3   Title Patient will be able to complete forward step down test without deviation in order to demonstrate improved LE strength.    Time 6    Period Weeks    Status Partially Met                 Plan - 05/21/20 0918    Clinical Impression Statement Patient has met 2/2 short term goals and 3/3 long term goals with ability to complete HEP and improved strength, symptoms, activity tolerance, and functional mobility. One long term goal was only partially met secondary to impaired balance/strength with forward step down test. Patient demonstrating great improvement in hip strength today. She remains limited by intermittent symptoms in anterior hip following initial transition to standing after extended periods of sitting. Patient educated on continuing HEP, returning to physical therapy if needed, exercise mechanics. Patient discharged from physical therapy at this this time.    Personal Factors and Comorbidities Age;Comorbidity 2;Fitness;Behavior Pattern    Comorbidities hx back pain, numerous surgeries    Examination-Activity Limitations Sit;Bend    Examination-Participation Restrictions Shop;Yard Work;Cleaning    Stability/Clinical Decision Making Stable/Uncomplicated    Rehab Potential Fair    PT Frequency --    PT Duration --    PT Treatment/Interventions ADLs/Self Care Home Management;Aquatic Therapy;Biofeedback;Stair training;Cryotherapy;Electrical Stimulation;Iontophoresis 99m/ml Dexamethasone;Moist Heat;Traction;Ultrasound;DME Instruction;Functional mobility training;Gait training;Therapeutic activities;Therapeutic exercise;Balance training;Neuromuscular re-education;Patient/family education;Orthotic Fit/Training;Manual techniques;Dry needling;Passive range  of motion;Scar mobilization;Spinal Manipulations;Joint Manipulations;Energy conservation    PT Next Visit Plan n/a    PT Home Exercise Plan 03/12/20 squats; 04/06/20 - sidelying clam, hip abduction, prone hip extension, supine butterfly stretch 11/4 lateral stepping, SLS with vectors; 04/13/20 - prone quad and supine hip flexors stretches; 04/20/20 - functional squats with green theraband around knees. 12/16 lateral step down    Consulted and Agree with Plan of Care Patient           Patient will benefit from skilled therapeutic intervention in order to improve the following deficits and impairments:  Decreased activity tolerance,Decreased balance,Decreased mobility,Increased muscle spasms,Improper body mechanics,Decreased range of motion  Visit Diagnosis: Pain in right hip  Low back pain, unspecified back pain laterality, unspecified chronicity, unspecified whether sciatica present  Muscle weakness (generalized)  Other abnormalities of gait and mobility  Other symptoms and signs involving the musculoskeletal system     Problem List Patient Active Problem List   Diagnosis Date Noted  . Primary osteoarthritis of right hip 01/17/2020  . Hip pain, bilateral 01/14/2020  . Decreased hearing 01/14/2020  . Pedal edema 11/19/2019  . Hoarseness   . Constipation   . Other dysphagia   . 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  . Insomnia 03/15/2019  . Abdominal pain, epigastric 05/09/2016  . Belching 05/09/2016  . Hyperlipidemia 06/18/2014  . Hyperglycemia 06/18/2014  . Gout 06/18/2014  . Osteopenia 10/28/2013  . Right foot pain 01/31/2013  . Hypertension 03/29/2011  . High cholesterol 03/29/2011    9:53 AM, 05/21/20 AMearl LatinPT, DPT [place] [address]Phone: [phone]  Fax:  [phone] Name: LVERNEAL WIERSMRN: [id number] of Birth: 309/02/1946

## 2020-05-21 NOTE — Patient Instructions (Signed)
Access Code: [email or web address] URL: [email or web address] Date: 05/21/2020 Prepared by: Mitzi Hansen Lillyian Heidt  Exercises Lateral Step Down - 1 x daily - 7 x weekly - 2 sets - 10 reps

## 2020-05-22 LAB — LIPID PANEL
Chol/HDL Ratio: 3.3 ratio (ref 0.0–4.4)
Cholesterol, Total: 166 mg/dL (ref 100–199)
HDL: 51 mg/dL (ref >39)
LDL Chol Calc (NIH): 90 mg/dL (ref 0–99)
Triglycerides: 141 mg/dL (ref 0–149)
VLDL Cholesterol Cal: 25 mg/dL (ref 5–40)

## 2020-05-22 LAB — HEPATIC FUNCTION PANEL
ALT: 18 IU/L (ref 0–32)
AST: 23 IU/L (ref 0–40)
Albumin: 4 g/dL (ref 3.7–4.7)
Alkaline Phosphatase: 80 IU/L (ref 44–121)
Bilirubin Total: 0.4 mg/dL (ref 0.0–1.2)
Bilirubin, Direct: 0.13 mg/dL (ref 0.00–0.40)
Total Protein: 6 g/dL (ref 6.0–8.5)

## 2020-05-22 LAB — BASIC METABOLIC PANEL
BUN/Creatinine Ratio: 10 — ABNORMAL LOW (ref 12–28)
BUN: 10 mg/dL (ref 8–27)
CO2: 26 mmol/L (ref 20–29)
Calcium: 8.9 mg/dL (ref 8.7–10.3)
Chloride: 103 mmol/L (ref 96–106)
Creatinine, Ser: 0.99 mg/dL (ref 0.57–1.00)
GFR calc Af Amer: 65 mL/min/{1.73_m2} (ref >59)
GFR calc non Af Amer: 57 mL/min/{1.73_m2} — ABNORMAL LOW (ref >59)
Glucose: 99 mg/dL (ref 65–99)
Potassium: 4.3 mmol/L (ref 3.5–5.2)
Sodium: 143 mmol/L (ref 134–144)

## 2020-05-22 LAB — HEMOGLOBIN A1C
Est. average glucose Bld gHb Est-mCnc: 123 mg/dL
Hgb A1c MFr Bld: 5.9 % — ABNORMAL HIGH (ref 4.8–5.6)

## 2020-05-31 ENCOUNTER — Other Ambulatory Visit: Payer: Self-pay

## 2020-05-31 ENCOUNTER — Ambulatory Visit
Admission: EM | Admit: 2020-05-31 | Discharge: 2020-05-31 | Disposition: A | Discharge status: Home / Self Care | Payer: Medicare Other | Attending: Family Medicine | Admitting: Family Medicine

## 2020-05-31 DIAGNOSIS — M109 Gout, unspecified: Secondary | ICD-10-CM

## 2020-05-31 DIAGNOSIS — M79674 Pain in right toe(s): Secondary | ICD-10-CM | POA: Diagnosis not present

## 2020-05-31 MED ORDER — COLCHICINE 0.6 MG PO TABS: 0.6000 mg | ORAL_TABLET | Freq: Every day | ORAL | 0 refills | Status: DC

## 2020-05-31 MED ORDER — DEXAMETHASONE SODIUM PHOSPHATE 10 MG/ML IJ SOLN
10.0000 mg | Freq: Once | INTRAMUSCULAR | Status: AC
  Administered 2020-05-31: 10 mg via INTRAMUSCULAR

## 2020-05-31 NOTE — ED Provider Notes (Signed)
[facility]   [id number] 05/31/20 Arrival Time: 0817  FG:HWEXH PAIN  SUBJECTIVE: History from: patient. Terri Douglas is a 74 y.o. female complains of right foot pain x 2 days. Describes the area as red, swollen and painful. Reports hx gout and states that this feels very similar. Denies a precipitating event or specific injury. Localizes the pain to the PIP joint of R great toe.  Describes the pain as constant and achy in character.  Has tried OTC medications without relief.  Symptoms are made worse with activity.  Denies fever, chills, ecchymosis,  weakness, numbness and tingling, saddle paresthesias, loss of bowel or bladder function.      ROS: As per HPI.  All other pertinent ROS negative.     Past Medical History:  Diagnosis Date  . Arthritis   . Complication of anesthesia   . Gall stones   . GERD (gastroesophageal reflux disease)   . History of cardiac catheterization 2005   Minor elevation in cardiac enzymes however no significant obstructive CAD  . Hyperlipidemia   . Hypertension   . Insomnia   . PONV (postoperative nausea and vomiting)   . Prediabetes    Past Surgical History:  Procedure Laterality Date  . A-FLUTTER ABLATION N/A 08/01/2017   Procedure: A-FLUTTER ABLATION;  Surgeon: Thompson Grayer, MD;  Location: [facility];  Service: Cardiovascular;  Laterality: N/A;  . ABDOMINAL HYSTERECTOMY    . BACK SURGERY  1992  . CARDIAC CATHETERIZATION     BACK IN 2004  SHE THINKS IT CAME BACK 'NORMAL'  . CHOLECYSTECTOMY N/A 06/24/2015   Procedure: LAPAROSCOPIC CHOLECYSTECTOMY;  Surgeon: Mickeal Skinner, MD;  Location: [facility];  Service: General;  Laterality: N/A;  . COLONOSCOPY N/A 08/30/2012   Procedure: COLONOSCOPY;  Surgeon: Rogene Houston, MD;  [facility];  Service: Endoscopy;  Laterality: N/A;  830  . DILATION AND CURETTAGE OF UTERUS    . ESOPHAGOGASTRODUODENOSCOPY  04/15/2011   Procedure: ESOPHAGOGASTRODUODENOSCOPY (EGD);  Surgeon: Rogene Houston, MD;  [facility];  Service: Endoscopy;  Laterality: N/A;  11:30  . ESOPHAGOGASTRODUODENOSCOPY N/A 05/27/2016   Procedure: ESOPHAGOGASTRODUODENOSCOPY (EGD);  Surgeon: Rogene Houston, MD;  [facility];  Service: Endoscopy;  Laterality: N/A;  11:15  . EYE SURGERY     cataract removal  . NECK SURGERY  12/13/2018  . REVERSE SHOULDER ARTHROPLASTY Left 04/03/2019   Procedure: REVERSE SHOULDER ARTHROPLASTY;  Surgeon: Hiram Gash, MD;  [facility];  Service: Orthopedics;  Laterality: Left;  Marland Kitchen VAGINAL HYSTERECTOMY     Allergies  Allergen Reactions  . Ibuprofen Hives  . Phenobarbital Other (See Comments)    Causes blurred vision   No current facility-administered medications on file prior to encounter.   Current Outpatient Medications on File Prior to Encounter  Medication Sig Dispense Refill  . ALPRAZolam (XANAX) 1 MG tablet TAKE ONE TABLET BY MOUTH AT BEDTIME AS NEEDED 90 tablet 1  . Biotin 5000 MCG TABS Take 5,000 mcg by mouth daily.    Marland Kitchen docusate sodium (COLACE) 100 MG capsule Take 200 mg by mouth at bedtime.    Marland Kitchen estradiol (ESTRACE) 2 MG tablet Take 2 mg by mouth daily.     . finasteride (PROSCAR) 5 MG tablet Take 2.5 mg by mouth daily.   4  . minoxidil (ROGAINE) 2 % external solution Apply 1 application topically at bedtime.     Marland Kitchen OVER THE COUNTER MEDICATION Hardin Negus colon health one daily    .  Polyethyl Glycol-Propyl Glycol (LUBRICANT EYE DROPS) 0.4-0.3 % SOLN Place 1-2 drops into both eyes daily as needed (dry/irritated eyes.).    Marland Kitchen potassium chloride (KLOR-CON) 10 MEQ tablet Take 2 tablet in the AM and 2 tablets in the PM 360 tablet 1  . pravastatin (PRAVACHOL) 80 MG tablet TAKE ONE (1) TABLET BY MOUTH EVERY DAY 90 tablet 1  . Probiotic Product (PHILLIPS COLON HEALTH PO) Take 1 capsule by mouth daily.    . Sennosides (LAXATIVE) 25 MG TABS Take 25 mg by mouth daily as needed (constipation.).    Marland Kitchen sertraline (ZOLOFT) 50 MG tablet Take one tablet  daily 90 tablet 1  . torsemide (DEMADEX) 20 MG tablet Take 1 tablet (20 mg total) by mouth every morning. 90 tablet 1   Social History   Socioeconomic History  . Marital status: Married    Spouse name: Not on file  . Number of children: Not on file  . Years of education: Not on file  . Highest education level: Not on file  Occupational History  . Not on file  Tobacco Use  . Smoking status: Never Smoker  . Smokeless tobacco: Never Used  Vaping Use  . Vaping Use: Never used  Substance and Sexual Activity  . Alcohol use: No  . Drug use: No  . Sexual activity: Never  Other Topics Concern  . Not on file  Social History Narrative  . Not on file   Social Determinants of Health   Financial Resource Strain: Not on file  Food Insecurity: Not on file  Transportation Needs: Not on file  Physical Activity: Not on file  Stress: Not on file  Social Connections: Not on file  Intimate Partner Violence: Not on file   Family History  Problem Relation Age of Onset  . Bone cancer Mother   . Cancer Mother   . Prostate cancer Father   . Cancer Father   . Heart disease Father   . Prostate cancer Brother   . Heart disease Brother   . Anesthesia problems Neg Hx   . Hypotension Neg Hx   . Malignant hyperthermia Neg Hx   . Pseudochol deficiency Neg Hx     OBJECTIVE:  Vitals:   05/31/20 0823  BP: 137/80  Pulse: 80  Temp: 98 F (36.7 C)  SpO2: 97%    General appearance: ALERT; in no acute distress.  Head: NCAT Lungs: Normal respiratory effort CV: pulses 2+ bilaterally. Cap refill < 2 seconds Musculoskeletal:  Inspection: Skin warm, dry, clear and intact. R great toe PIP joint erythematous and swollen Palpation: r great toe tender to palpation ROM: limited ROM active and passive to R great toe Skin: warm and dry Neurologic: Ambulates without difficulty; Sensation intact about the upper/ lower extremities Psychological: alert and cooperative; normal mood and  affect  DIAGNOSTIC STUDIES:  No results found.   ASSESSMENT & PLAN:  1. Acute gout involving toe of right foot, unspecified cause   2. Great toe pain, right      Meds ordered this encounter  Medications  . colchicine 0.6 MG tablet    Sig: Take 1 tablet (0.6 mg total) by mouth daily.    Dispense:  30 tablet    Refill:  0    Order Specific Question:   Supervising Provider    Answer:   Chase Picket [id number]  . dexamethasone (DECADRON) injection 10 mg   Decadron 10mg  IM in office today Prescribed colchicine Continue conservative management of rest, ice, and  gentle stretches Take ibuprofen as needed for pain relief (may cause abdominal discomfort, ulcers, and GI bleeds avoid taking with other NSAIDs) Follow up with PCP if symptoms persist Return or go to the ER if you have any new or worsening symptoms (fever, chills, chest pain, abdominal pain, changes in bowel or bladder habits, pain radiating into lower legs)   Reviewed expectations re: course of current medical issues. Questions answered. Outlined signs and symptoms indicating need for more acute intervention. Patient verbalized understanding. After Visit Summary given.       Faustino Congress, NP 06/01/20 1215

## 2020-05-31 NOTE — Discharge Instructions (Signed)
You have received a steroid injection in the office today  I have prescribed colchicine for you to take. Take one tablet when you get your medication and one tablet about 4 hours later. Then take one tablet daily until symptoms resolve.  Follow up with this office or with primary care if symptoms are persisting.  Follow up in the ER for high fever, trouble swallowing, trouble breathing, other concerning symptoms.

## 2020-05-31 NOTE — ED Triage Notes (Signed)
Pt presents with right foot pain for past 2 days, great toe is red and hot, has h/o gout

## 2020-06-08 ENCOUNTER — Encounter (HOSPITAL_COMMUNITY): Payer: Medicare Other | Admitting: Physical Therapy

## 2020-06-10 ENCOUNTER — Encounter (HOSPITAL_COMMUNITY): Payer: Medicare Other | Admitting: Physical Therapy

## 2020-06-15 ENCOUNTER — Encounter (HOSPITAL_COMMUNITY): Payer: Medicare Other | Admitting: Physical Therapy

## 2020-06-17 ENCOUNTER — Encounter (HOSPITAL_COMMUNITY): Payer: Medicare Other | Admitting: Physical Therapy

## 2020-06-19 ENCOUNTER — Other Ambulatory Visit (INDEPENDENT_AMBULATORY_CARE_PROVIDER_SITE_OTHER): Payer: Self-pay | Admitting: Internal Medicine

## 2020-06-22 ENCOUNTER — Encounter (HOSPITAL_COMMUNITY): Payer: Medicare Other | Admitting: Physical Therapy

## 2020-06-23 ENCOUNTER — Ambulatory Visit (INDEPENDENT_AMBULATORY_CARE_PROVIDER_SITE_OTHER): Payer: Medicare Other | Admitting: Internal Medicine

## 2020-06-24 ENCOUNTER — Encounter (HOSPITAL_COMMUNITY): Payer: Medicare Other | Admitting: Physical Therapy

## 2020-06-24 NOTE — Telephone Encounter (Signed)
Last seen by Dr. Laural Golden 06/18/2019 for hoarseness and constipation.

## 2020-06-24 NOTE — Telephone Encounter (Signed)
Will refill medication for 3 months, needs follow up appointment with any provider in order to receive any refills or ask PCP.  Thanks,  Maylon Peppers, MD Gastroenterology and Hepatology [facility].;

## 2020-06-25 DIAGNOSIS — M25512 Pain in left shoulder: Secondary | ICD-10-CM | POA: Diagnosis not present

## 2020-06-26 ENCOUNTER — Other Ambulatory Visit: Payer: Self-pay | Admitting: Orthopaedic Surgery

## 2020-06-26 ENCOUNTER — Ambulatory Visit
Admission: RE | Admit: 2020-06-26 | Discharge: 2020-06-26 | Disposition: A | Discharge status: Home / Self Care | Payer: Medicare Other | Source: Ambulatory Visit | Attending: Orthopaedic Surgery | Admitting: Orthopaedic Surgery

## 2020-06-26 ENCOUNTER — Other Ambulatory Visit: Payer: Self-pay

## 2020-06-26 DIAGNOSIS — M25512 Pain in left shoulder: Secondary | ICD-10-CM

## 2020-06-26 DIAGNOSIS — Z96642 Presence of left artificial hip joint: Secondary | ICD-10-CM | POA: Diagnosis not present

## 2020-06-26 DIAGNOSIS — Z471 Aftercare following joint replacement surgery: Secondary | ICD-10-CM | POA: Diagnosis not present

## 2020-06-26 DIAGNOSIS — Z96612 Presence of left artificial shoulder joint: Secondary | ICD-10-CM | POA: Diagnosis not present

## 2020-06-29 ENCOUNTER — Encounter (HOSPITAL_COMMUNITY): Payer: Medicare Other | Admitting: Physical Therapy

## 2020-06-30 ENCOUNTER — Other Ambulatory Visit: Payer: Medicare Other

## 2020-07-01 ENCOUNTER — Encounter (HOSPITAL_COMMUNITY): Payer: Medicare Other | Admitting: Physical Therapy

## 2020-07-06 ENCOUNTER — Encounter (HOSPITAL_COMMUNITY): Payer: Medicare Other | Admitting: Physical Therapy

## 2020-07-08 ENCOUNTER — Encounter (HOSPITAL_COMMUNITY): Payer: Medicare Other | Admitting: Physical Therapy

## 2020-07-10 ENCOUNTER — Other Ambulatory Visit: Payer: Medicare Other

## 2020-07-13 ENCOUNTER — Encounter (HOSPITAL_COMMUNITY): Payer: Medicare Other | Admitting: Physical Therapy

## 2020-07-15 ENCOUNTER — Encounter (HOSPITAL_COMMUNITY): Payer: Medicare Other | Admitting: Physical Therapy

## 2020-08-14 ENCOUNTER — Encounter

## 2020-08-18 DIAGNOSIS — L814 Other melanin hyperpigmentation: Secondary | ICD-10-CM | POA: Diagnosis not present

## 2020-08-18 DIAGNOSIS — L570 Actinic keratosis: Secondary | ICD-10-CM | POA: Diagnosis not present

## 2020-08-18 DIAGNOSIS — D2271 Melanocytic nevi of right lower limb, including hip: Secondary | ICD-10-CM | POA: Diagnosis not present

## 2020-08-18 DIAGNOSIS — C44311 Basal cell carcinoma of skin of nose: Secondary | ICD-10-CM | POA: Diagnosis not present

## 2020-08-18 DIAGNOSIS — L578 Other skin changes due to chronic exposure to nonionizing radiation: Secondary | ICD-10-CM | POA: Diagnosis not present

## 2020-08-18 DIAGNOSIS — D224 Melanocytic nevi of scalp and neck: Secondary | ICD-10-CM | POA: Diagnosis not present

## 2020-08-18 DIAGNOSIS — D2261 Melanocytic nevi of right upper limb, including shoulder: Secondary | ICD-10-CM | POA: Diagnosis not present

## 2020-08-18 DIAGNOSIS — D485 Neoplasm of uncertain behavior of skin: Secondary | ICD-10-CM | POA: Diagnosis not present

## 2020-08-18 DIAGNOSIS — Z85828 Personal history of other malignant neoplasm of skin: Secondary | ICD-10-CM | POA: Diagnosis not present

## 2020-08-20 ENCOUNTER — Inpatient Hospital Stay: Admit: 2020-08-20 | Payer: MEDICARE | Attending: Physician Assistant | Primary: Physician Assistant

## 2020-08-20 DIAGNOSIS — R413 Other amnesia: Secondary | ICD-10-CM

## 2020-08-20 LAB — CREATININE, POC
Creatinine, POC: 1 MG/DL (ref 0.6–1.3)
GFRAA, POC: >60 mL/min/{1.73_m2} (ref >60)
GFRNA, POC: 54 mL/min/{1.73_m2} — ABNORMAL LOW (ref >60)

## 2020-08-20 LAB — AMB POC CREATININE
Creatinine, POC: 1 MG/DL (ref 0.6–1.3)
GFR African American: >60 mL/min/{1.73_m2} (ref >60)
GFR Non-African American: 54 mL/min/{1.73_m2} — ABNORMAL LOW (ref >60)

## 2020-08-20 MED ORDER — GADOTERATE MEGLUMINE 0.5 MMOL/ML IV SYRINGE
0.5 mmol/mL | Freq: Once | INTRAVENOUS | Status: AC
Start: 2020-08-20 — End: 2020-08-20
  Administered 2020-08-20: 14:00:00 via INTRAVENOUS

## 2020-08-20 MED FILL — DOTAREM 0.5 MMOL/ML INTRAVENOUS SYRINGE: 0.5 mmol/mL | INTRAVENOUS | Qty: 20

## 2020-08-26 ENCOUNTER — Telehealth: Payer: Self-pay | Admitting: Family Medicine

## 2020-08-26 NOTE — Telephone Encounter (Signed)
Pt contacted regarding Dermatology Report on Basal Cell Carcinoma. Pt states she does she dermatology Jari Pigg, MD). She is to see Dr.Gould every 6 months. Seen her on Tuesday and had another place removed. May have to go to [place].

## 2020-09-04 DIAGNOSIS — C44311 Basal cell carcinoma of skin of nose: Secondary | ICD-10-CM | POA: Diagnosis not present

## 2020-09-08 ENCOUNTER — Encounter

## 2020-09-22 ENCOUNTER — Inpatient Hospital Stay
Admit: 2020-09-22 | Payer: MEDICARE | Attending: Student in an Organized Health Care Education/Training Program | Primary: Physician Assistant

## 2020-09-22 ENCOUNTER — Encounter

## 2020-09-22 DIAGNOSIS — E039 Hypothyroidism, unspecified: Secondary | ICD-10-CM

## 2020-09-28 ENCOUNTER — Encounter (INDEPENDENT_AMBULATORY_CARE_PROVIDER_SITE_OTHER): Payer: Self-pay | Admitting: Gastroenterology

## 2020-09-28 ENCOUNTER — Ambulatory Visit (INDEPENDENT_AMBULATORY_CARE_PROVIDER_SITE_OTHER): Payer: Medicare Other | Admitting: Internal Medicine

## 2020-09-28 ENCOUNTER — Ambulatory Visit (INDEPENDENT_AMBULATORY_CARE_PROVIDER_SITE_OTHER): Payer: Medicare Other | Admitting: Gastroenterology

## 2020-09-28 ENCOUNTER — Other Ambulatory Visit: Payer: Self-pay

## 2020-09-28 VITALS — BP 135/76 | HR 72 | Temp 98.1°F | Ht 62.0 in | Wt 137.0 lb

## 2020-09-28 DIAGNOSIS — K219 Gastro-esophageal reflux disease without esophagitis: Secondary | ICD-10-CM | POA: Diagnosis not present

## 2020-09-28 DIAGNOSIS — R490 Dysphonia: Secondary | ICD-10-CM

## 2020-09-28 MED ORDER — FLUTICASONE PROPIONATE 50 MCG/ACT NA SUSP
1.0000 | Freq: Two times a day (BID) | NASAL | 2 refills | Status: DC
Start: 2020-09-28 — End: 2021-07-20

## 2020-09-28 NOTE — Patient Instructions (Signed)
Stop esomeprazole Start flonase every 12 hours Call back to schedule your colonoscopy

## 2020-09-28 NOTE — Progress Notes (Signed)
Maylon Peppers, M.D. Gastroenterology & Hepatology [facility] [address] [place], [place] [address]  Primary Care Physician: Kathyrn Drown, MD [place] [id number]  I will communicate my assessment and recommendations to the referring MD via EMR.  Problems: 1. LPR  History of Present Illness: Terri Douglas is a 75 y.o. female with past medical history of arthritis, hyperlipidemia, hypertension, prediabetes, who presents for follow up of LPR and hoarseness.  The patient was last seen on 06/18/2019. At that time, the patient was advised to start omeprazole 40 mg twice a day.  She was ordered to undergo a barium esophagram.She performed this test on 06/21/2019 which showed a tiny sliding hiatal hernia without any other findings.  The patient states that she has not noticed any difference with the use of esomeprazole.  She reports that she recently obtained discomfort in her throat but her main concern is the persistence of hoarseness.  Occasionally she has some trouble with swallowing pills or when she eats solids, especially as she tends to eat most of her  food without liquids until the end. States she has been to speech therapy, was given recommendations to improve hoarseness but it works for a short period of time.  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  The patient denies having any nausea, vomiting, fever, chills, hematochezia, melena, hematemesis, abdominal distention, abdominal pain, diarrhea, jaundice, pruritus or weight loss.  Last EGD: 2016 -   - Normal esophagus.                           - Z-line irregular, 36 cm from the incisors.                           - A few gastric polyps. These polyps were biopsied                            back in November 2012 and were fundic gland polyps.                           - Normal duodenal bulb and second  portion of the                            duodenum.                           - No specimens collected. Last Colonoscopy: 2014 - Examination performed to cecum. Three small polyps ablated via cold biopsy and submitted together(two at cecum and one at ascending colon). Had fragments of tubular adenomas in samples. Small external hemorrhoids and two anal papillae.  Past Medical History: Past Medical History:  Diagnosis Date  . Arthritis   . Complication of anesthesia   . Gall stones   . GERD (gastroesophageal reflux disease)   . History of cardiac catheterization 2005   Minor elevation in cardiac enzymes however no significant obstructive CAD  . Hyperlipidemia   . Hypertension   . Insomnia   . PONV (postoperative nausea and vomiting)   . Prediabetes     Past Surgical History: Past Surgical History:  Procedure Laterality Date  . A-FLUTTER  ABLATION N/A 08/01/2017   Procedure: A-FLUTTER ABLATION;  Surgeon: Thompson Grayer, MD;  Location: [place] [facility];  Service: Cardiovascular;  Laterality: N/A;  . ABDOMINAL HYSTERECTOMY    . BACK SURGERY  1992  . CARDIAC CATHETERIZATION     BACK IN 2004  SHE THINKS IT CAME BACK 'NORMAL'  . CHOLECYSTECTOMY N/A 06/24/2015   Procedure: LAPAROSCOPIC CHOLECYSTECTOMY;  Surgeon: Mickeal Skinner, MD;  Location: [place];  Service: General;  Laterality: N/A;  . COLONOSCOPY N/A 08/30/2012   Procedure: COLONOSCOPY;  Surgeon: Rogene Houston, MD;  [facility]: [facility];  Service: Endoscopy;  Laterality: N/A;  830  . DILATION AND CURETTAGE OF UTERUS    . ESOPHAGOGASTRODUODENOSCOPY  04/15/2011   Procedure: ESOPHAGOGASTRODUODENOSCOPY (EGD);  Surgeon: Rogene Houston, MD;  Location: [facility];  Service: Endoscopy;  Laterality: N/A;  11:30  . ESOPHAGOGASTRODUODENOSCOPY N/A 05/27/2016   Procedure: ESOPHAGOGASTRODUODENOSCOPY (EGD);  Surgeon: Rogene Houston, MD;  Location: [facility];  Service: Endoscopy;  Laterality: N/A;  11:15  . EYE SURGERY      cataract removal  . NECK SURGERY  12/13/2018  . REVERSE SHOULDER ARTHROPLASTY Left 04/03/2019   Procedure: REVERSE SHOULDER ARTHROPLASTY;  Surgeon: Hiram Gash, MD;  Location: [facility];  Service: Orthopedics;  Laterality: Left;  Marland Kitchen VAGINAL HYSTERECTOMY      Family History: Family History  Problem Relation Age of Onset  . Bone cancer Mother   . Cancer Mother   . Prostate cancer Father   . Cancer Father   . Heart disease Father   . Prostate cancer Brother   . Heart disease Brother   . Anesthesia problems Neg Hx   . Hypotension Neg Hx   . Malignant hyperthermia Neg Hx   . Pseudochol deficiency Neg Hx     Social History: Social History   Tobacco Use  Smoking Status Never Smoker  Smokeless Tobacco Never Used   Social History   Substance and Sexual Activity  Alcohol Use No   Social History   Substance and Sexual Activity  Drug Use No    Allergies: Allergies  Allergen Reactions  . Ibuprofen Hives  . Phenobarbital Other (See Comments)    Causes blurred vision    Medications: Current Outpatient Medications  Medication Sig Dispense Refill  . ALPRAZolam (XANAX) 1 MG tablet TAKE ONE TABLET BY MOUTH AT BEDTIME AS NEEDED 90 tablet 1  . Biotin 5000 MCG TABS Take 5,000 mcg by mouth daily.    Marland Kitchen docusate sodium (COLACE) 100 MG capsule Take 200 mg by mouth at bedtime.    Marland Kitchen esomeprazole (NEXIUM) 40 MG capsule TAKE ONE CAPSULE ONCE DAILY 90 capsule 0  . estradiol (ESTRACE) 2 MG tablet Take 2 mg by mouth daily.     . finasteride (PROSCAR) 5 MG tablet Take 2.5 mg by mouth daily.   4  . minoxidil (ROGAINE) 2 % external solution Apply 1 application topically at bedtime.     Marland Kitchen OVER THE COUNTER MEDICATION Hardin Negus colon health one daily    . Polyethyl Glycol-Propyl Glycol (LUBRICANT EYE DROPS) 0.4-0.3 % SOLN Place 1-2 drops into both eyes daily as needed (dry/irritated eyes.).    Marland Kitchen potassium chloride (KLOR-CON) 10 MEQ tablet Take 2 tablet in the AM and 2 tablets in the PM 360 tablet  1  . pravastatin (PRAVACHOL) 80 MG tablet TAKE ONE (1) TABLET BY MOUTH EVERY DAY 90 tablet 1  . Probiotic Product (PHILLIPS COLON HEALTH PO) Take 1 capsule by mouth daily.    Marland Kitchen  Sennosides (LAXATIVE) 25 MG TABS Take 25 mg by mouth daily as needed (constipation.).    Marland Kitchen sertraline (ZOLOFT) 50 MG tablet Take one tablet daily 90 tablet 1  . torsemide (DEMADEX) 20 MG tablet Take 1 tablet (20 mg total) by mouth every morning. 90 tablet 1  . colchicine 0.6 MG tablet Take 1 tablet (0.6 mg total) by mouth daily. 30 tablet 0   No current facility-administered medications for this visit.    Review of Systems: GENERAL: negative for malaise, night sweats HEENT: No changes in hearing or vision, no nose bleeds or other nasal problems. NECK: Negative for lumps, goiter, pain and significant neck swelling RESPIRATORY: Negative for cough, wheezing CARDIOVASCULAR: Negative for chest pain, leg swelling, palpitations, orthopnea GI: SEE HPI MUSCULOSKELETAL: Negative for joint pain or swelling, back pain, and muscle pain. SKIN: Negative for lesions, rash PSYCH: Negative for sleep disturbance, mood disorder and recent psychosocial stressors. HEMATOLOGY Negative for prolonged bleeding, bruising easily, and swollen nodes. ENDOCRINE: Negative for cold or heat intolerance, polyuria, polydipsia and goiter. NEURO: negative for tremor, gait imbalance, syncope and seizures. The remainder of the review of systems is noncontributory.   Physical Exam: BP 135/76 (BP Location: Left Arm, Patient Position: Sitting, Cuff Size: Small)   Pulse 72   Temp 98.1 F (36.7 C) (Oral)   Ht 5\' 2"  (1.575 m)   Wt 137 lb (62.1 kg)   BMI 25.06 kg/m  GENERAL: The patient is AO x3, in no acute distress. HEENT: Head is normocephalic and atraumatic. EOMI are intact. Mouth is well hydrated and without lesions. Has adequate healing wound in R side of nose. NECK: Supple. No masses LUNGS: Clear to auscultation. No presence of  rhonchi/wheezing/rales. Adequate chest expansion HEART: RRR, normal s1 and s2. ABDOMEN: Soft, nontender, no guarding, no peritoneal signs, and nondistended. BS +. No masses. EXTREMITIES: Without any cyanosis, clubbing, rash, lesions or edema. NEUROLOGIC: AOx3, no focal motor deficit. SKIN: no jaundice, no rashes  Imaging/Labs: as above  I personally reviewed and interpreted the available labs, imaging and endoscopic files.  Impression and Plan: Terri Douglas is a 75 y.o. female with past medical history of arthritis, hyperlipidemia, hypertension, prediabetes, who presents for follow up of LPR and hoarseness.  The patient has presented chronic symptoms without presence of red flag signs.  She has undergone previous endoscopic investigations without any major abnormalities.  She has not improved with intake of PPI at high dose, to consider this can be stopped safely at this point as it has failed to provide any benefit to the patient.  Explained to patient that the symptoms could be related to possible postnasal drip, for which she will be prescribed an empirical course with Flonase.  The patient understood and agreed.  She will continue implementing the recommendations by the speech therapist.  She is due for colorectal cancer screening but states "has many ongoing things in her life right now" I would like to call us back when she has decided when she wants to have her colonoscopy.  - Stop esomeprazole - Start flonase every 12 hours  All questions were answered.      Harvel Quale, MD Gastroenterology and Hepatology [facility]

## 2020-10-06 ENCOUNTER — Ambulatory Visit (INDEPENDENT_AMBULATORY_CARE_PROVIDER_SITE_OTHER): Payer: Medicare Other | Admitting: Internal Medicine

## 2020-10-09 DIAGNOSIS — I788 Other diseases of capillaries: Secondary | ICD-10-CM | POA: Diagnosis not present

## 2020-10-09 DIAGNOSIS — I781 Nevus, non-neoplastic: Secondary | ICD-10-CM | POA: Diagnosis not present

## 2020-10-09 DIAGNOSIS — I789 Disease of capillaries, unspecified: Secondary | ICD-10-CM | POA: Diagnosis not present

## 2020-10-22 DIAGNOSIS — Z1231 Encounter for screening mammogram for malignant neoplasm of breast: Secondary | ICD-10-CM | POA: Diagnosis not present

## 2020-10-23 DIAGNOSIS — Z01419 Encounter for gynecological examination (general) (routine) without abnormal findings: Secondary | ICD-10-CM | POA: Diagnosis not present

## 2020-10-23 DIAGNOSIS — Z6824 Body mass index (BMI) 24.0-24.9, adult: Secondary | ICD-10-CM | POA: Diagnosis not present

## 2020-10-26 ENCOUNTER — Other Ambulatory Visit (INDEPENDENT_AMBULATORY_CARE_PROVIDER_SITE_OTHER): Payer: Self-pay | Admitting: *Deleted

## 2020-10-26 DIAGNOSIS — R1319 Other dysphagia: Secondary | ICD-10-CM

## 2020-10-26 DIAGNOSIS — K219 Gastro-esophageal reflux disease without esophagitis: Secondary | ICD-10-CM

## 2020-10-26 MED ORDER — ESOMEPRAZOLE MAGNESIUM 40 MG PO CPDR: 40.0000 mg | DELAYED_RELEASE_CAPSULE | Freq: Every day | ORAL | 3 refills | Status: DC

## 2020-11-11 ENCOUNTER — Ambulatory Visit: Payer: Medicare Other | Admitting: Family Medicine

## 2020-11-17 DIAGNOSIS — N390 Urinary tract infection, site not specified: Secondary | ICD-10-CM | POA: Diagnosis not present

## 2020-11-17 DIAGNOSIS — M109 Gout, unspecified: Secondary | ICD-10-CM | POA: Diagnosis not present

## 2020-11-17 DIAGNOSIS — R7303 Prediabetes: Secondary | ICD-10-CM | POA: Diagnosis not present

## 2020-11-17 DIAGNOSIS — T148XXA Other injury of unspecified body region, initial encounter: Secondary | ICD-10-CM | POA: Diagnosis not present

## 2020-11-17 DIAGNOSIS — R81 Glycosuria: Secondary | ICD-10-CM | POA: Diagnosis not present

## 2020-11-19 ENCOUNTER — Inpatient Hospital Stay (HOSPITAL_COMMUNITY)
Admission: EM | Admit: 2020-11-19 | Discharge: 2020-11-22 | DRG: 641 | Disposition: A | Discharge status: Home Health | Payer: Medicare Other | Attending: Family Medicine | Admitting: Family Medicine

## 2020-11-19 ENCOUNTER — Ambulatory Visit (INDEPENDENT_AMBULATORY_CARE_PROVIDER_SITE_OTHER): Payer: Medicare Other | Admitting: Family Medicine

## 2020-11-19 ENCOUNTER — Emergency Department (HOSPITAL_COMMUNITY): Payer: Medicare Other

## 2020-11-19 ENCOUNTER — Other Ambulatory Visit: Payer: Self-pay

## 2020-11-19 ENCOUNTER — Encounter (HOSPITAL_COMMUNITY): Payer: Self-pay | Admitting: Emergency Medicine

## 2020-11-19 VITALS — Temp 100.0°F

## 2020-11-19 DIAGNOSIS — Z90710 Acquired absence of both cervix and uterus: Secondary | ICD-10-CM

## 2020-11-19 DIAGNOSIS — E860 Dehydration: Secondary | ICD-10-CM | POA: Diagnosis not present

## 2020-11-19 DIAGNOSIS — E876 Hypokalemia: Secondary | ICD-10-CM | POA: Diagnosis not present

## 2020-11-19 DIAGNOSIS — R197 Diarrhea, unspecified: Secondary | ICD-10-CM

## 2020-11-19 DIAGNOSIS — R109 Unspecified abdominal pain: Secondary | ICD-10-CM | POA: Diagnosis not present

## 2020-11-19 DIAGNOSIS — Z886 Allergy status to analgesic agent status: Secondary | ICD-10-CM

## 2020-11-19 DIAGNOSIS — M10072 Idiopathic gout, left ankle and foot: Secondary | ICD-10-CM | POA: Diagnosis not present

## 2020-11-19 DIAGNOSIS — Z79899 Other long term (current) drug therapy: Secondary | ICD-10-CM

## 2020-11-19 DIAGNOSIS — M1990 Unspecified osteoarthritis, unspecified site: Secondary | ICD-10-CM | POA: Diagnosis not present

## 2020-11-19 DIAGNOSIS — I10 Essential (primary) hypertension: Secondary | ICD-10-CM | POA: Diagnosis not present

## 2020-11-19 DIAGNOSIS — M25511 Pain in right shoulder: Secondary | ICD-10-CM | POA: Diagnosis present

## 2020-11-19 DIAGNOSIS — Z8042 Family history of malignant neoplasm of prostate: Secondary | ICD-10-CM

## 2020-11-19 DIAGNOSIS — Z9049 Acquired absence of other specified parts of digestive tract: Secondary | ICD-10-CM

## 2020-11-19 DIAGNOSIS — N3000 Acute cystitis without hematuria: Secondary | ICD-10-CM | POA: Diagnosis not present

## 2020-11-19 DIAGNOSIS — Z20822 Contact with and (suspected) exposure to covid-19: Secondary | ICD-10-CM | POA: Diagnosis present

## 2020-11-19 DIAGNOSIS — R296 Repeated falls: Secondary | ICD-10-CM | POA: Diagnosis present

## 2020-11-19 DIAGNOSIS — A046 Enteritis due to Yersinia enterocolitica: Secondary | ICD-10-CM | POA: Diagnosis present

## 2020-11-19 DIAGNOSIS — R262 Difficulty in walking, not elsewhere classified: Secondary | ICD-10-CM | POA: Diagnosis present

## 2020-11-19 DIAGNOSIS — M549 Dorsalgia, unspecified: Secondary | ICD-10-CM | POA: Diagnosis not present

## 2020-11-19 DIAGNOSIS — R531 Weakness: Secondary | ICD-10-CM | POA: Diagnosis not present

## 2020-11-19 DIAGNOSIS — R739 Hyperglycemia, unspecified: Secondary | ICD-10-CM | POA: Diagnosis present

## 2020-11-19 DIAGNOSIS — E871 Hypo-osmolality and hyponatremia: Secondary | ICD-10-CM | POA: Diagnosis not present

## 2020-11-19 DIAGNOSIS — R1319 Other dysphagia: Secondary | ICD-10-CM

## 2020-11-19 DIAGNOSIS — M109 Gout, unspecified: Secondary | ICD-10-CM | POA: Diagnosis present

## 2020-11-19 DIAGNOSIS — E785 Hyperlipidemia, unspecified: Secondary | ICD-10-CM | POA: Diagnosis present

## 2020-11-19 DIAGNOSIS — G4700 Insomnia, unspecified: Secondary | ICD-10-CM | POA: Diagnosis present

## 2020-11-19 DIAGNOSIS — M1611 Unilateral primary osteoarthritis, right hip: Secondary | ICD-10-CM | POA: Diagnosis present

## 2020-11-19 DIAGNOSIS — M79606 Pain in leg, unspecified: Secondary | ICD-10-CM | POA: Diagnosis not present

## 2020-11-19 DIAGNOSIS — K5730 Diverticulosis of large intestine without perforation or abscess without bleeding: Secondary | ICD-10-CM | POA: Diagnosis not present

## 2020-11-19 DIAGNOSIS — R824 Acetonuria: Secondary | ICD-10-CM | POA: Diagnosis present

## 2020-11-19 DIAGNOSIS — D72829 Elevated white blood cell count, unspecified: Secondary | ICD-10-CM | POA: Diagnosis not present

## 2020-11-19 DIAGNOSIS — Z8249 Family history of ischemic heart disease and other diseases of the circulatory system: Secondary | ICD-10-CM

## 2020-11-19 DIAGNOSIS — Z9181 History of falling: Secondary | ICD-10-CM

## 2020-11-19 DIAGNOSIS — K219 Gastro-esophageal reflux disease without esophagitis: Secondary | ICD-10-CM

## 2020-11-19 DIAGNOSIS — M25551 Pain in right hip: Secondary | ICD-10-CM | POA: Diagnosis not present

## 2020-11-19 DIAGNOSIS — R52 Pain, unspecified: Secondary | ICD-10-CM | POA: Diagnosis not present

## 2020-11-19 DIAGNOSIS — R7303 Prediabetes: Secondary | ICD-10-CM | POA: Diagnosis present

## 2020-11-19 DIAGNOSIS — I700 Atherosclerosis of aorta: Secondary | ICD-10-CM | POA: Diagnosis not present

## 2020-11-19 DIAGNOSIS — Z888 Allergy status to other drugs, medicaments and biological substances status: Secondary | ICD-10-CM

## 2020-11-19 LAB — COMPREHENSIVE METABOLIC PANEL
ALT: 22 U/L (ref 0–44)
AST: 24 U/L (ref 15–41)
Albumin: 3.5 g/dL (ref 3.5–5.0)
Alkaline Phosphatase: 110 U/L (ref 38–126)
Anion gap: 11 (ref 5–15)
BUN: 14 mg/dL (ref 8–23)
CO2: 27 mmol/L (ref 22–32)
Calcium: 8.3 mg/dL — ABNORMAL LOW (ref 8.9–10.3)
Chloride: 93 mmol/L — ABNORMAL LOW (ref 98–111)
Creatinine, Ser: 0.91 mg/dL (ref 0.44–1.00)
GFR, Estimated: >60 mL/min (ref >60)
Glucose, Bld: 114 mg/dL — ABNORMAL HIGH (ref 70–99)
Potassium: 3 mmol/L — ABNORMAL LOW (ref 3.5–5.1)
Sodium: 131 mmol/L — ABNORMAL LOW (ref 135–145)
Total Bilirubin: 0.8 mg/dL (ref 0.3–1.2)
Total Protein: 7.5 g/dL (ref 6.5–8.1)

## 2020-11-19 LAB — URINALYSIS, ROUTINE W REFLEX MICROSCOPIC
Bilirubin Urine: NEGATIVE
Glucose, UA: NEGATIVE mg/dL
Ketones, ur: 40 mg/dL — AB
Nitrite: NEGATIVE
Protein, ur: NEGATIVE mg/dL
Specific Gravity, Urine: 1.01 (ref 1.005–1.030)
pH: 6 (ref 5.0–8.0)

## 2020-11-19 LAB — LACTIC ACID, PLASMA
Lactic Acid, Venous: 1.3 mmol/L (ref 0.5–1.9)
Lactic Acid, Venous: 1.2 mmol/L (ref 0.5–1.9)

## 2020-11-19 LAB — CBC WITH DIFFERENTIAL/PLATELET
Abs Immature Granulocytes: 0.06 10*3/uL (ref 0.00–0.07)
Basophils Absolute: 0 10*3/uL (ref 0.0–0.1)
Basophils Relative: 0 %
Eosinophils Absolute: 0 10*3/uL (ref 0.0–0.5)
Eosinophils Relative: 0 %
HCT: 38.3 % (ref 36.0–46.0)
Hemoglobin: 12.1 g/dL (ref 12.0–15.0)
Immature Granulocytes: 0 %
Lymphocytes Relative: 7 %
Lymphs Abs: 1 10*3/uL (ref 0.7–4.0)
MCH: 28.9 pg (ref 26.0–34.0)
MCHC: 31.6 g/dL (ref 30.0–36.0)
MCV: 91.4 fL (ref 80.0–100.0)
Monocytes Absolute: 1.1 10*3/uL — ABNORMAL HIGH (ref 0.1–1.0)
Monocytes Relative: 9 %
Neutro Abs: 11.2 10*3/uL — ABNORMAL HIGH (ref 1.7–7.7)
Neutrophils Relative %: 84 %
Platelets: 400 10*3/uL (ref 150–400)
RBC: 4.19 MIL/uL (ref 3.87–5.11)
RDW: 13.4 % (ref 11.5–15.5)
WBC: 13.4 10*3/uL — ABNORMAL HIGH (ref 4.0–10.5)
nRBC: 0 % (ref 0.0–0.2)

## 2020-11-19 LAB — URINALYSIS, MICROSCOPIC (REFLEX)

## 2020-11-19 LAB — LIPASE, BLOOD: Lipase: 28 U/L (ref 11–51)

## 2020-11-19 MED ORDER — MORPHINE SULFATE (PF) 4 MG/ML IV SOLN
4.0000 mg | Freq: Once | INTRAVENOUS | Status: AC
Start: 2020-11-19 — End: 2020-11-19
  Administered 2020-11-19: 4 mg via INTRAVENOUS
  Filled 2020-11-19: qty 1

## 2020-11-19 MED ORDER — SODIUM CHLORIDE 0.9 % IV BOLUS
1000.0000 mL | Freq: Once | INTRAVENOUS | Status: AC
  Administered 2020-11-19: 1000 mL via INTRAVENOUS

## 2020-11-19 NOTE — ED Triage Notes (Signed)
Pt recently seen at pcp. Pt has hx of gout (flare up in right big toe), UTI (prescribed antibiotics), diarrhea,  and right shoulder pain.

## 2020-11-19 NOTE — ED Notes (Signed)
EDP at bedside  

## 2020-11-19 NOTE — ED Provider Notes (Signed)
[facility] EMERGENCY DEPARTMENT Provider Note   CSN: [id number] Arrival date & time: 11/19/20  1608     History No chief complaint on file.   Terri Douglas is a 75 y.o. female.  She is sent in from her primary care doctor's office for a multiple complaints.  She said she was at the beach this [place] and started with urinary symptoms about a week ago.  She was seen at an urgent care where they gave her antibiotics (does not know the name) and some medicine for a gout flareup on her left big toe.  She has had gout before.  She is having pain in her right groin and her right scapula that she rates as severe that started a few days ago, much more since 5 hour car ride home.  She is also felt weak and has not eaten or drank anything.  She denies nausea though.  She is also had 20+ episodes of diarrhea that started last evening.  Denies fever cough chest pain shortness of breath.  Went to primary care doctor today and needed to be carried into the office.  They recommended she go to the emergency department.  The history is provided by the patient.  Flank Pain This is a new problem. The current episode started yesterday. The problem occurs constantly. The problem has not changed since onset.Pertinent negatives include no chest pain, no abdominal pain, no headaches and no shortness of breath. Nothing aggravates the symptoms. Nothing relieves the symptoms. She has tried rest for the symptoms. The treatment provided no relief.      Past Medical History:  Diagnosis Date   Arthritis    Complication of anesthesia    Gall stones    GERD (gastroesophageal reflux disease)    History of cardiac catheterization 2005   Minor elevation in cardiac enzymes however no significant obstructive CAD   Hyperlipidemia    Hypertension    Insomnia    PONV (postoperative nausea and vomiting)    Prediabetes     Patient Active Problem List   Diagnosis Date Noted   Laryngopharyngeal reflux (LPR) 09/28/2020    Primary osteoarthritis of right hip 01/17/2020   Hip pain, bilateral 01/14/2020   Decreased hearing 01/14/2020   Pedal edema 11/19/2019   Hoarseness    Constipation    Other dysphagia    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   Insomnia 03/15/2019   Abdominal pain, epigastric 05/09/2016   Belching 05/09/2016   Hyperlipidemia 06/18/2014   Hyperglycemia 06/18/2014   Gout 06/18/2014   Osteopenia 10/28/2013   Right foot pain 01/31/2013   Hypertension 03/29/2011   High cholesterol 03/29/2011    Past Surgical History:  Procedure Laterality Date   A-FLUTTER ABLATION N/A 08/01/2017   Procedure: A-FLUTTER ABLATION;  Surgeon: Thompson Grayer, MD;  Location: [place] [facility];  Service: Cardiovascular;  Laterality: N/A;   ABDOMINAL HYSTERECTOMY     BACK SURGERY  1992   CARDIAC CATHETERIZATION     BACK IN 2004  SHE THINKS IT CAME BACK 'NORMAL'   CHOLECYSTECTOMY N/A 06/24/2015   Procedure: LAPAROSCOPIC CHOLECYSTECTOMY;  Surgeon: Mickeal Skinner, MD;  Location: [place];  Service: General;  Laterality: N/A;   COLONOSCOPY N/A 08/30/2012   Procedure: COLONOSCOPY;  Surgeon: Rogene Houston, MD;  [facility];  Service: Endoscopy;  Laterality: N/A;  [place]     ESOPHAGOGASTRODUODENOSCOPY  04/15/2011   Procedure: ESOPHAGOGASTRODUODENOSCOPY (EGD);  Surgeon: Bernadene Person  Gloriann Loan, MD;  Location: [facility];  Service: Endoscopy;  Laterality: N/A;  11:30   ESOPHAGOGASTRODUODENOSCOPY N/A 05/27/2016   Procedure: ESOPHAGOGASTRODUODENOSCOPY (EGD);  Surgeon: Rogene Houston, MD;  Location: [facility];  Service: Endoscopy;  Laterality: N/A;  11:15   EYE SURGERY     cataract removal   NECK SURGERY  12/13/2018   REVERSE SHOULDER ARTHROPLASTY Left 04/03/2019   Procedure: REVERSE SHOULDER ARTHROPLASTY;  Surgeon: Hiram Gash, MD;  Location: [facility];  Service: Orthopedics;  Laterality: Left;   VAGINAL HYSTERECTOMY       OB History   No obstetric  history on file.     Family History  Problem Relation Age of Onset   Bone cancer Mother    Cancer Mother    Prostate cancer Father    Cancer Father    Heart disease Father    Prostate cancer Brother    Heart disease Brother    Anesthesia problems Neg Hx    Hypotension Neg Hx    Malignant hyperthermia Neg Hx    Pseudochol deficiency Neg Hx     Social History   Tobacco Use   Smoking status: Never   Smokeless tobacco: Never  Vaping Use   Vaping Use: Never used  Substance Use Topics   Alcohol use: No   Drug use: No    Home Medications Prior to Admission medications   Medication Sig Start Date End Date Taking? Authorizing Provider  ALPRAZolam Duanne Moron) 1 MG tablet TAKE ONE TABLET BY MOUTH AT BEDTIME AS NEEDED 05/21/20   Kathyrn Drown, MD  Biotin 5000 MCG TABS Take 5,000 mcg by mouth daily.    [provider]  colchicine 0.6 MG tablet Take 1 tablet (0.6 mg total) by mouth daily. 05/31/20 06/30/20  Faustino Congress, NP  docusate sodium (COLACE) 100 MG capsule Take 200 mg by mouth at bedtime.    [provider]  esomeprazole (NEXIUM) 40 MG capsule Take 1 capsule (40 mg total) by mouth daily at 12 noon. 10/26/20   Rehman, Mechele Dawley, MD  estradiol (ESTRACE) 2 MG tablet Take 2 mg by mouth daily.  08/01/18   [provider]  finasteride (PROSCAR) 5 MG tablet Take 2.5 mg by mouth daily.  08/27/15   [provider]  fluticasone (FLONASE) 50 MCG/ACT nasal spray Place 1 spray into both nostrils 2 (two) times daily. 09/28/20   Harvel Quale, MD  minoxidil (ROGAINE) 2 % external solution Apply 1 application topically at bedtime.     [provider]  OVER THE COUNTER MEDICATION Hardin Negus colon health one daily    [provider]  Polyethyl Glycol-Propyl Glycol (LUBRICANT EYE DROPS) 0.4-0.3 % SOLN Place 1-2 drops into both eyes daily as needed (dry/irritated eyes.).    [provider]  potassium chloride (KLOR-CON) 10 MEQ  tablet Take 2 tablet in the AM and 2 tablets in the PM 05/20/20   Luking, Elayne Snare, MD  pravastatin (PRAVACHOL) 80 MG tablet TAKE ONE (1) TABLET BY MOUTH EVERY DAY 05/20/20   Kathyrn Drown, MD  Probiotic Product (PHILLIPS COLON HEALTH PO) Take 1 capsule by mouth daily.    [provider]  Sennosides (LAXATIVE) 25 MG TABS Take 25 mg by mouth daily as needed (constipation.).    [provider]  sertraline (ZOLOFT) 50 MG tablet Take one tablet daily 05/20/20   Kathyrn Drown, MD  torsemide (DEMADEX) 20 MG tablet Take 1 tablet (20 mg total) by mouth every  morning. 05/20/20   Kathyrn Drown, MD    Allergies    Ibuprofen and Phenobarbital  Review of Systems   Review of Systems  Constitutional:  Positive for fatigue. Negative for fever.  HENT:  Negative for sore throat.   Eyes:  Negative for visual disturbance.  Respiratory:  Negative for shortness of breath.   Cardiovascular:  Negative for chest pain.  Gastrointestinal:  Positive for diarrhea. Negative for abdominal pain and vomiting.  Genitourinary:  Positive for dysuria and flank pain.  Musculoskeletal:  Positive for back pain. Negative for neck pain.  Skin:  Negative for rash.  Neurological:  Negative for headaches.   Physical Exam Updated Vital Signs BP (!) 159/67 (BP Location: Left Arm)   Pulse 84   Temp 100.2 F (37.9 C) (Oral)   Resp 18   Ht 5\' 2"  (1.575 m)   Wt 62.1 kg   SpO2 99%   BMI 25.04 kg/m   Physical Exam Vitals and nursing note reviewed.  Constitutional:      General: She is not in acute distress.    Appearance: Normal appearance. She is well-developed.  HENT:     Head: Normocephalic and atraumatic.  Eyes:     Conjunctiva/sclera: Conjunctivae normal.  Cardiovascular:     Rate and Rhythm: Normal rate and regular rhythm.     Heart sounds: No murmur heard. Pulmonary:     Effort: Pulmonary effort is normal. No respiratory distress.     Breath sounds: Normal breath sounds.  Abdominal:      Palpations: Abdomen is soft.     Tenderness: There is no abdominal tenderness. There is no guarding or rebound.  Musculoskeletal:        General: Tenderness present. No deformity.     Cervical back: Neck supple.     Comments: She is tender in her right groin although I do not palpate any abnormality.  She also says she is tender on her right scapula.  No reproducible tenderness.  Skin:    General: Skin is warm and dry.  Neurological:     General: No focal deficit present.     Mental Status: She is alert.    ED Results / Procedures / Treatments   Labs (all labs ordered are listed, but only abnormal results are displayed) Labs Reviewed  COMPREHENSIVE METABOLIC PANEL - Abnormal; Notable for the following components:      Result Value   Sodium 131 (*)    Potassium 3.0 (*)    Chloride 93 (*)    Glucose, Bld 114 (*)    Calcium 8.3 (*)    All other components within normal limits  CBC WITH DIFFERENTIAL/PLATELET - Abnormal; Notable for the following components:   WBC 13.4 (*)    Neutro Abs 11.2 (*)    Monocytes Absolute 1.1 (*)    All other components within normal limits  URINALYSIS, ROUTINE W REFLEX MICROSCOPIC - Abnormal; Notable for the following components:   Hgb urine dipstick SMALL (*)    Ketones, ur 40 (*)    Leukocytes,Ua MODERATE (*)    All other components within normal limits  URINALYSIS, MICROSCOPIC (REFLEX) - Abnormal; Notable for the following components:   Bacteria, UA RARE (*)    All other components within normal limits  COMPREHENSIVE METABOLIC PANEL - Abnormal; Notable for the following components:   Potassium 3.4 (*)    Glucose, Bld 104 (*)    Calcium 8.0 (*)    Total Protein 6.4 (*)  Albumin 2.8 (*)    All other components within normal limits  CBC - Abnormal; Notable for the following components:   WBC 11.8 (*)    RBC 3.73 (*)    Hemoglobin 10.9 (*)    HCT 34.5 (*)    All other components within normal limits  MAGNESIUM - Abnormal; Notable for the  following components:   Magnesium 1.5 (*)    All other components within normal limits  PHOSPHORUS - Abnormal; Notable for the following components:   Phosphorus 2.1 (*)    All other components within normal limits  CULTURE, BLOOD (ROUTINE X 2)  CULTURE, BLOOD (ROUTINE X 2)  RESP PANEL BY RT-PCR (FLU A&B, COVID) ARPGX2  URINE CULTURE  C DIFFICILE QUICK SCREEN W PCR REFLEX    GASTROINTESTINAL PANEL BY PCR, STOOL (REPLACES STOOL CULTURE)  LACTIC ACID, PLASMA  LIPASE, BLOOD  LACTIC ACID, PLASMA  PROTIME-INR  APTT  BASIC METABOLIC PANEL    EKG None  Radiology DG Chest 1 View  Result Date: 11/19/2020 CLINICAL DATA:  Right scapula pain EXAM: CHEST  1 VIEW COMPARISON:  03/13/2020 FINDINGS: Surgical hardware in the cervical spine. No focal opacity or pleural effusion. Left shoulder replacement. Normal cardiomediastinal silhouette. No pneumothorax. IMPRESSION: No active disease. Electronically Signed   By: Donavan Foil M.D.   On: 11/19/2020 23:33   CT Renal Stone Study  Result Date: 11/19/2020 CLINICAL DATA:  Back pain EXAM: CT ABDOMEN AND PELVIS WITHOUT CONTRAST TECHNIQUE: Multidetector CT imaging of the abdomen and pelvis was performed following the standard protocol without IV contrast. COMPARISON:  CT 02/15/2011 FINDINGS: Lower chest: Lung bases demonstrate no acute consolidation or effusion. Normal cardiac size. Hepatobiliary: No focal liver abnormality is seen. Status post cholecystectomy. No biliary dilatation. Pancreas: Unremarkable. No pancreatic ductal dilatation or surrounding inflammatory changes. Spleen: Normal in size without focal abnormality. Adrenals/Urinary Tract: Adrenal glands are normal. Prominent renal pelvises without hydronephrosis. Negative for urinary tract calculus. Bladder is normal Stomach/Bowel: Stomach is within normal limits. Appendix appears normal. No evidence of bowel wall thickening, distention, or inflammatory changes. Scattered colon diverticula.  Vascular/Lymphatic: Mild aortic atherosclerosis without aneurysm. No suspicious nodes. Reproductive: Status post hysterectomy. No adnexal masses. Other: Negative for free air or free fluid Musculoskeletal: No acute or suspicious osseous abnormality. IMPRESSION: 1. No CT evidence for acute intraabdominal or intrapelvic abnormality 2. Scattered colon diverticula without acute inflammatory change Electronically Signed   By: Donavan Foil M.D.   On: 11/19/2020 19:58   DG HIP UNILAT WITH PELVIS 2-3 VIEWS RIGHT  Result Date: 11/19/2020 CLINICAL DATA:  Right hip pain EXAM: DG HIP (WITH OR WITHOUT PELVIS) 2-3V RIGHT COMPARISON:  01/14/2020 FINDINGS: Pubic symphysis and rami are intact. No fracture or malalignment. Mild arthritis of the right hip. IMPRESSION: No acute osseous abnormality.  Arthritis of the right hip Electronically Signed   By: Donavan Foil M.D.   On: 11/19/2020 23:33    Procedures Procedures   Medications Ordered in ED Medications  morphine 4 MG/ML injection 4 mg (has no administration in time range)  sodium chloride 0.9 % bolus 1,000 mL (has no administration in time range)    ED Course  I have reviewed the triage vital signs and the nursing notes.  Pertinent labs & imaging results that were available during my care of the patient were reviewed by me and considered in my medical decision making (see chart for details).  Clinical Course as of 11/20/20 1114  Thu Nov 19, 2020  2241 Vacation continues  to feel too weak to be able to be returned home.  Her urine is equivocal for infection with 11-20 whites.  Urine culture ordered.  Does have a white count, low sodium low potassium.  Ordered stool studies with patient has not produced anything. [MB]  2303 Discussed with [facility] hospitalist Dr. Josephine Cables who will evaluate for admission. [MB]    Clinical Course User Index [MB] Hayden Rasmussen, MD   MDM Rules/Calculators/A&P                         This patient complains of generalized  fatigue and weakness, right hip and groin pain, left great toe pain, diarrhea; this involves an extensive number of treatment Options and is a complaint that carries with it a high risk of complications and Morbidity. The differential includes infectious diarrhea, C. difficile, dehydration, metabolic derangement, gout, arthritis, occult fracture  I ordered, reviewed and interpreted labs, which included CBC with mildly elevated white count nonspecific, stable hemoglobin, chemistries with low sodium low potassium low magnesium, COVID testing negative, urinalysis equivocal for infection urine culture sent I ordered medication IV pain medication IV fluids I ordered imaging studies which included CT renal along with chest x-ray and right hip x-ray and I independently    visualized and interpreted imaging which showed no acute findings  Previous records obtained and reviewed in epic, no recent admissions I consulted [facility] hospitalist Dr. Josephine Cables and discussed lab and imaging findings  Critical Interventions: None  After the interventions stated above, I reevaluated the patient and found patient still to be symptomatic in respect to generalized weakness and inability to ambulate.  She would be better served by admission to the hospital for IV hydration and PT OT consult.  Patient in agreement with plan.   Final Clinical Impression(s) / ED Diagnoses Final diagnoses:  Right hip pain  Generalized weakness  Hypokalemia    Rx / DC Orders ED Discharge Orders     None        Hayden Rasmussen, MD 11/20/20 1117

## 2020-11-19 NOTE — Progress Notes (Signed)
   Subjective:    Patient ID: Terri Douglas, female    DOB: 1945-08-16, 75 y.o.   MRN: [id number]  HPI  Patient arrives for a follow up from urgent care for dysuria, Gout and severe shoulder pain. Patient also having diarrhea and fever and feels like she is dehydrated. Patient given Cefuroxime 500, Methocarbamol 750mg  and colchine 0.6  Patient last Saturday started having dysuria and urinary frequency.  Then she went to the urgent care 2 days ago placed on antibiotics plus also placed on colchicine for gout.  Patient has been having diarrhea since then.  Also has right flank pain and discomfort along with fever chills sweats nausea.  Patient extremely weak unable to drink much unable to stand family members had to pick her up and carry her to the car to bring her to the office. Review of Systems     Objective:   Physical Exam Lungs are clear respiratory rate normal heart regular flank pain on the right side abdomen soft skin cool Blood pressure 120/72 Temperature 100 HPI exam in the [place]      Assessment & Plan:  1. Flank pain Suspected pyelonephritis.  Patient to stand up.  Cannot do orthostatics.  Too weak to have her husband transport her to the ER.  Will utilize EMS.  2. Acute cystitis without hematuria See discussion above.  Will need lab work possibly culture possibly CAT scan or ultrasound to rule out pyelonephritis  3. Diarrhea, unspecified type Diarrhea possibly due to the antibiotics also possibly due to colchicine  4. Acute idiopathic gout of left foot Will need treatment other than colchicine possibly prednisone patient allergic to NSAIDs

## 2020-11-19 NOTE — ED Notes (Signed)
Patient transported to CT 

## 2020-11-19 NOTE — H&P (Signed)
History and Physical  ZELPHA MESSING ZHG:[id number] DOB: 08-Jun-1945 DOA: 11/19/2020  Referring physician: Hayden Rasmussen, MD PCP: Kathyrn Drown, MD  Patient coming from: Home  Chief Complaint: Left Big toe pain, diarrhea and generalized weakness    HPI: Terri Douglas is a 75 y.o. female with medical history significant for arthritis GERD, hyperlipidemia, hypertension, gout who presents to the emergency department from her PCPs office due to multiple complaints.  Patient complained of urinary symptoms about a week ago and she presented to an urgent care where she was provided with antibiotic (does not remember name), she was also prescribed with medication for gout flareup on her left big toe, unfortunately, there has been no improvement in gout symptoms since onset of medication.  Patient states that she not have a gout flareup in 5 years, so she stopped taking the gout medication.  She complained of several episodes daily of diarrhea which started since yesterday, this was associated with weakness to the extent that she needed to be carried into the office when she went to PCPs office.  Patient also complained of right hip and right shoulder pain.  She was asked to go to the ED for further evaluation of her symptoms.  She denies fever, chills, vomiting, chest pain or shortness of breath.  ED Course:  In the emergency department, she was hemodynamically stable.  Work-up in the ED showed leukocytosis, hyponatremia, hypokalemia, urinalysis was positive for ketonuria but was unimpressive for UTI, lactic acid x2 was negative.  Lipase 28.  Influenza A, B, SARS coronavirus was negative. Chest x-ray showed no active disease Right hip x-ray showed no acute osseous abnormality, but showed arthritis of the right hip CT abdomen and pelvis without contrast showed no acute intra-abdominal or intrapelvic abnormality Morphine was given due to pain, IV hydration was provided.  Hospitalist was asked to admit  patient for further evaluation and management.  Review of Systems: Constitutional: Positive for fatigue.  Negative for chills and fever.  HENT: Negative for ear pain and sore throat.   Eyes: Negative for pain and visual disturbance.  Respiratory: Negative for cough, chest tightness and shortness of breath.   Cardiovascular: Negative for chest pain and palpitations.  Gastrointestinal positive for diarrhea.  Abdominal pain and vomiting.  Endocrine: Negative for polyphagia and polyuria.  Genitourinary: Negative for decreased urine volume, dysuria, enuresis Musculoskeletal: Positive for right hip and right shoulder pain  Skin: Negative for color change and rash.  Allergic/Immunologic: Negative for immunocompromised state.  Neurological: Positive for weakness.  Negative for tremors, syncope, speech difficulty Hematological: Does not bruise/bleed easily.  All other systems reviewed and are negative  Past Medical History:  Diagnosis Date   Arthritis    Complication of anesthesia    Gall stones    GERD (gastroesophageal reflux disease)    History of cardiac catheterization 2005   Minor elevation in cardiac enzymes however no significant obstructive CAD   Hyperlipidemia    Hypertension    Insomnia    PONV (postoperative nausea and vomiting)    Prediabetes    Past Surgical History:  Procedure Laterality Date   A-FLUTTER ABLATION N/A 08/01/2017   Procedure: A-FLUTTER ABLATION;  Surgeon: Thompson Grayer, MD;  Location: [facility];  Service: Cardiovascular;  Laterality: N/A;   ABDOMINAL HYSTERECTOMY     BACK SURGERY  1992   CARDIAC CATHETERIZATION     BACK IN 2004  SHE THINKS IT CAME BACK 'NORMAL'   CHOLECYSTECTOMY N/A 06/24/2015  Procedure: LAPAROSCOPIC CHOLECYSTECTOMY;  Surgeon: Mickeal Skinner, MD;  Location: [facility];  Service: General;  Laterality: N/A;   COLONOSCOPY N/A 08/30/2012   Procedure: COLONOSCOPY;  Surgeon: Rogene Houston, MD;  Location: [facility];  Service:  Endoscopy;  Laterality: N/A;  [place]     ESOPHAGOGASTRODUODENOSCOPY  04/15/2011   Procedure: ESOPHAGOGASTRODUODENOSCOPY (EGD);  Surgeon: Rogene Houston, MD;  Location: [facility];  Service: Endoscopy;  Laterality: N/A;  11:30   ESOPHAGOGASTRODUODENOSCOPY N/A 05/27/2016   Procedure: ESOPHAGOGASTRODUODENOSCOPY (EGD);  Surgeon: Rogene Houston, MD;  Location: [facility];  Service: Endoscopy;  Laterality: N/A;  11:15   EYE SURGERY     cataract removal   NECK SURGERY  12/13/2018   REVERSE SHOULDER ARTHROPLASTY Left 04/03/2019   Procedure: REVERSE SHOULDER ARTHROPLASTY;  Surgeon: Hiram Gash, MD;  Location: [facility];  Service: Orthopedics;  Laterality: Left;   VAGINAL HYSTERECTOMY      Social History:  reports that she has never smoked. She has never used smokeless tobacco. She reports that she does not drink alcohol and does not use drugs.   Allergies  Allergen Reactions   Ibuprofen Hives   Phenobarbital Other (See Comments)    Causes blurred vision    Family History  Problem Relation Age of Onset   Bone cancer Mother    Cancer Mother    Prostate cancer Father    Cancer Father    Heart disease Father    Prostate cancer Brother    Heart disease Brother    Anesthesia problems Neg Hx    Hypotension Neg Hx    Malignant hyperthermia Neg Hx    Pseudochol deficiency Neg Hx      Prior to Admission medications   Medication Sig Start Date End Date Taking? Authorizing Provider  ALPRAZolam Duanne Moron) 1 MG tablet TAKE ONE TABLET BY MOUTH AT BEDTIME AS NEEDED 05/21/20   Kathyrn Drown, MD  Biotin 5000 MCG TABS Take 5,000 mcg by mouth daily.    [provider]  colchicine 0.6 MG tablet Take 1 tablet (0.6 mg total) by mouth daily. 05/31/20 06/30/20  Faustino Congress, NP  docusate sodium (COLACE) 100 MG capsule Take 200 mg by mouth at bedtime.    [provider]  esomeprazole (NEXIUM) 40 MG capsule Take 1 capsule (40 mg total) by mouth  daily at 12 noon. 10/26/20   Rehman, Mechele Dawley, MD  estradiol (ESTRACE) 2 MG tablet Take 2 mg by mouth daily.  08/01/18   [provider]  finasteride (PROSCAR) 5 MG tablet Take 2.5 mg by mouth daily.  08/27/15   [provider]  fluticasone (FLONASE) 50 MCG/ACT nasal spray Place 1 spray into both nostrils 2 (two) times daily. 09/28/20   Harvel Quale, MD  minoxidil (ROGAINE) 2 % external solution Apply 1 application topically at bedtime.     [provider]  OVER THE COUNTER MEDICATION Hardin Negus colon health one daily    [provider]  Polyethyl Glycol-Propyl Glycol (LUBRICANT EYE DROPS) 0.4-0.3 % SOLN Place 1-2 drops into both eyes daily as needed (dry/irritated eyes.).    [provider]  potassium chloride (KLOR-CON) 10 MEQ tablet Take 2 tablet in the AM and 2 tablets in the PM 05/20/20   Luking, Elayne Snare, MD  pravastatin (PRAVACHOL) 80 MG tablet TAKE ONE (1) TABLET BY MOUTH EVERY DAY 05/20/20   Kathyrn Drown, MD  Probiotic Product ([place])  Take 1 capsule by mouth daily.    [provider]  Sennosides (LAXATIVE) 25 MG TABS Take 25 mg by mouth daily as needed (constipation.).    [provider]  sertraline (ZOLOFT) 50 MG tablet Take one tablet daily 05/20/20   Kathyrn Drown, MD  torsemide (DEMADEX) 20 MG tablet Take 1 tablet (20 mg total) by mouth every morning. 05/20/20   Kathyrn Drown, MD    Physical Exam: BP (!) 123/47   Pulse 81   Temp 98.3 F (36.8 C) (Oral)   Resp 18   Ht 5\' 2"  (1.575 m)   Wt 62.1 kg   SpO2 97%   BMI 25.04 kg/m   General: 75 y.o. year-old female well developed well nourished in no acute distress.  Alert and oriented x3. HEENT: Dry mucous membrane.  NCAT, EOMI Neck: Supple, trachea medial Cardiovascular: Regular rate and rhythm with no rubs or gallops.  No thyromegaly or JVD noted.  No lower extremity edema. 2/4 pulses in all 4 extremities. Respiratory: Clear to  auscultation with no wheezes or rales. Good inspiratory effort. Abdomen: Soft, nontender nondistended with normal bowel sounds x4 quadrants. Muskuloskeletal: Tender to palpation of left big toe.  No cyanosis, clubbing or edema noted bilaterally Neuro: CN II-XII intact, strength 5/5 x 4, sensation, reflexes intact Skin: No ulcerative lesions noted or rashes Psychiatry: Mood is appropriate for condition and setting          Labs on Admission:  Basic Metabolic Panel: Recent Labs  Lab 11/19/20 1608  NA 131*  K 3.0*  CL 93*  CO2 27  GLUCOSE 114*  BUN 14  CREATININE 0.91  CALCIUM 8.3*   Liver Function Tests: Recent Labs  Lab 11/19/20 1608  AST 24  ALT 22  ALKPHOS 110  BILITOT 0.8  PROT 7.5  ALBUMIN 3.5   Recent Labs  Lab 11/19/20 1608  LIPASE 28   No results for input(s): AMMONIA in the last 168 hours. CBC: Recent Labs  Lab 11/19/20 1608  WBC 13.4*  NEUTROABS 11.2*  HGB 12.1  HCT 38.3  MCV 91.4  PLT 400   Cardiac Enzymes: No results for input(s): CKTOTAL, CKMB, CKMBINDEX, TROPONINI in the last 168 hours.  BNP (last 3 results) No results for input(s): BNP in the last 8760 hours.  ProBNP (last 3 results) No results for input(s): PROBNP in the last 8760 hours.  CBG: No results for input(s): GLUCAP in the last 168 hours.  Radiological Exams on Admission: DG Chest 1 View  Result Date: 11/19/2020 CLINICAL DATA:  Right scapula pain EXAM: CHEST  1 VIEW COMPARISON:  03/13/2020 FINDINGS: Surgical hardware in the cervical spine. No focal opacity or pleural effusion. Left shoulder replacement. Normal cardiomediastinal silhouette. No pneumothorax. IMPRESSION: No active disease. Electronically Signed   By: Donavan Foil M.D.   On: 11/19/2020 23:33   CT Renal Stone Study  Result Date: 11/19/2020 CLINICAL DATA:  Back pain EXAM: CT ABDOMEN AND PELVIS WITHOUT CONTRAST TECHNIQUE: Multidetector CT imaging of the abdomen and pelvis was performed following the standard  protocol without IV contrast. COMPARISON:  CT 02/15/2011 FINDINGS: Lower chest: Lung bases demonstrate no acute consolidation or effusion. Normal cardiac size. Hepatobiliary: No focal liver abnormality is seen. Status post cholecystectomy. No biliary dilatation. Pancreas: Unremarkable. No pancreatic ductal dilatation or surrounding inflammatory changes. Spleen: Normal in size without focal abnormality. Adrenals/Urinary Tract: Adrenal glands are normal. Prominent renal pelvises without hydronephrosis. Negative for urinary tract calculus. Bladder is normal Stomach/Bowel: Stomach is  within normal limits. Appendix appears normal. No evidence of bowel wall thickening, distention, or inflammatory changes. Scattered colon diverticula. Vascular/Lymphatic: Mild aortic atherosclerosis without aneurysm. No suspicious nodes. Reproductive: Status post hysterectomy. No adnexal masses. Other: Negative for free air or free fluid Musculoskeletal: No acute or suspicious osseous abnormality. IMPRESSION: 1. No CT evidence for acute intraabdominal or intrapelvic abnormality 2. Scattered colon diverticula without acute inflammatory change Electronically Signed   By: Donavan Foil M.D.   On: 11/19/2020 19:58   DG HIP UNILAT WITH PELVIS 2-3 VIEWS RIGHT  Result Date: 11/19/2020 CLINICAL DATA:  Right hip pain EXAM: DG HIP (WITH OR WITHOUT PELVIS) 2-3V RIGHT COMPARISON:  01/14/2020 FINDINGS: Pubic symphysis and rami are intact. No fracture or malalignment. Mild arthritis of the right hip. IMPRESSION: No acute osseous abnormality.  Arthritis of the right hip Electronically Signed   By: Donavan Foil M.D.   On: 11/19/2020 23:33    EKG: I independently viewed the EKG done and my findings are as followed: EKG was not done in the ED  Assessment/Plan Present on Admission:  Dehydration  Gout  Principal Problem:   Generalized weakness Active Problems:   Gout   Dehydration   Arthritis   Diarrhea   Hyponatremia   Hypokalemia    Leukocytosis  Generalized weakness possibly secondary to multifactorial This may be due to electrolyte abnormality secondary to acute diarrhea with subsequent dehydration Continue IV hydration and correct electrolytes Continue fall precaution and neurochecks Continue PT/OT eval and treat  Acute diarrhea Continue IV hydration C. difficile and GI stool panel pending  Dehydration Continue IV hydration  Acute gouty flare Patient states that she used to take colchicine, this will be continued  Hyponatremia possibly due to diarrhea and dehydration Na 131, gentle IV hydration and continue to monitor sodium levels with serial BMP  Hypokalemia possibly due to diarrhea K+ 3.0, this will be replenished  Right hip and right shoulder pain possibly secondary to history of arthritis Continue Tylenol as needed  Leukocytosis possibly reactive WBC 13.4; no obvious sign of acute infectious process at this time Continue to monitor WBC with morning labs   DVT prophylaxis: Lovenox  Code Status: Full code  Family Communication: None at bedside  Disposition Plan:  Patient is from:                        home Anticipated DC to:                   SNF or family members home Anticipated DC date:               2-3 days Anticipated DC barriers:          Patient requires inpatient management due to acute diarrhea and electrolyte abnormalities which requires replenishment    Consults called: None  Admission status: Observation    Bernadette Hoit MD [facility]  11/20/2020, 3:05 AM

## 2020-11-20 ENCOUNTER — Inpatient Hospital Stay: Payer: Medicare Other | Admitting: Family Medicine

## 2020-11-20 DIAGNOSIS — R296 Repeated falls: Secondary | ICD-10-CM | POA: Diagnosis present

## 2020-11-20 DIAGNOSIS — D72829 Elevated white blood cell count, unspecified: Secondary | ICD-10-CM

## 2020-11-20 DIAGNOSIS — G4700 Insomnia, unspecified: Secondary | ICD-10-CM | POA: Diagnosis present

## 2020-11-20 DIAGNOSIS — M1611 Unilateral primary osteoarthritis, right hip: Secondary | ICD-10-CM | POA: Diagnosis present

## 2020-11-20 DIAGNOSIS — Z888 Allergy status to other drugs, medicaments and biological substances status: Secondary | ICD-10-CM | POA: Diagnosis not present

## 2020-11-20 DIAGNOSIS — R824 Acetonuria: Secondary | ICD-10-CM | POA: Diagnosis present

## 2020-11-20 DIAGNOSIS — A046 Enteritis due to Yersinia enterocolitica: Secondary | ICD-10-CM | POA: Diagnosis present

## 2020-11-20 DIAGNOSIS — Z79899 Other long term (current) drug therapy: Secondary | ICD-10-CM | POA: Diagnosis not present

## 2020-11-20 DIAGNOSIS — M1990 Unspecified osteoarthritis, unspecified site: Secondary | ICD-10-CM | POA: Diagnosis present

## 2020-11-20 DIAGNOSIS — R7303 Prediabetes: Secondary | ICD-10-CM | POA: Diagnosis present

## 2020-11-20 DIAGNOSIS — R531 Weakness: Secondary | ICD-10-CM

## 2020-11-20 DIAGNOSIS — Z886 Allergy status to analgesic agent status: Secondary | ICD-10-CM | POA: Diagnosis not present

## 2020-11-20 DIAGNOSIS — R197 Diarrhea, unspecified: Secondary | ICD-10-CM | POA: Diagnosis present

## 2020-11-20 DIAGNOSIS — Z9049 Acquired absence of other specified parts of digestive tract: Secondary | ICD-10-CM | POA: Diagnosis not present

## 2020-11-20 DIAGNOSIS — Z8042 Family history of malignant neoplasm of prostate: Secondary | ICD-10-CM | POA: Diagnosis not present

## 2020-11-20 DIAGNOSIS — M109 Gout, unspecified: Secondary | ICD-10-CM | POA: Diagnosis present

## 2020-11-20 DIAGNOSIS — K219 Gastro-esophageal reflux disease without esophagitis: Secondary | ICD-10-CM | POA: Diagnosis present

## 2020-11-20 DIAGNOSIS — Z8249 Family history of ischemic heart disease and other diseases of the circulatory system: Secondary | ICD-10-CM | POA: Diagnosis not present

## 2020-11-20 DIAGNOSIS — E871 Hypo-osmolality and hyponatremia: Secondary | ICD-10-CM

## 2020-11-20 DIAGNOSIS — E876 Hypokalemia: Secondary | ICD-10-CM | POA: Diagnosis present

## 2020-11-20 DIAGNOSIS — Z20822 Contact with and (suspected) exposure to covid-19: Secondary | ICD-10-CM | POA: Diagnosis present

## 2020-11-20 DIAGNOSIS — R262 Difficulty in walking, not elsewhere classified: Secondary | ICD-10-CM | POA: Diagnosis not present

## 2020-11-20 DIAGNOSIS — M25511 Pain in right shoulder: Secondary | ICD-10-CM | POA: Diagnosis present

## 2020-11-20 DIAGNOSIS — Z90710 Acquired absence of both cervix and uterus: Secondary | ICD-10-CM | POA: Diagnosis not present

## 2020-11-20 DIAGNOSIS — I10 Essential (primary) hypertension: Secondary | ICD-10-CM | POA: Diagnosis present

## 2020-11-20 DIAGNOSIS — E785 Hyperlipidemia, unspecified: Secondary | ICD-10-CM | POA: Diagnosis present

## 2020-11-20 DIAGNOSIS — E860 Dehydration: Secondary | ICD-10-CM | POA: Diagnosis present

## 2020-11-20 LAB — COMPREHENSIVE METABOLIC PANEL
ALT: 17 U/L (ref 0–44)
AST: 20 U/L (ref 15–41)
Albumin: 2.8 g/dL — ABNORMAL LOW (ref 3.5–5.0)
Alkaline Phosphatase: 93 U/L (ref 38–126)
Anion gap: 10 (ref 5–15)
BUN: 12 mg/dL (ref 8–23)
CO2: 27 mmol/L (ref 22–32)
Calcium: 8 mg/dL — ABNORMAL LOW (ref 8.9–10.3)
Chloride: 100 mmol/L (ref 98–111)
Creatinine, Ser: 0.8 mg/dL (ref 0.44–1.00)
GFR, Estimated: >60 mL/min (ref >60)
Glucose, Bld: 104 mg/dL — ABNORMAL HIGH (ref 70–99)
Potassium: 3.4 mmol/L — ABNORMAL LOW (ref 3.5–5.1)
Sodium: 137 mmol/L (ref 135–145)
Total Bilirubin: 0.8 mg/dL (ref 0.3–1.2)
Total Protein: 6.4 g/dL — ABNORMAL LOW (ref 6.5–8.1)

## 2020-11-20 LAB — RESP PANEL BY RT-PCR (FLU A&B, COVID) ARPGX2
Influenza A by PCR: NEGATIVE
Influenza B by PCR: NEGATIVE
SARS Coronavirus 2 by RT PCR: NEGATIVE

## 2020-11-20 LAB — BASIC METABOLIC PANEL
Anion gap: 8 (ref 5–15)
BUN: 14 mg/dL (ref 8–23)
CO2: 26 mmol/L (ref 22–32)
Calcium: 8.4 mg/dL — ABNORMAL LOW (ref 8.9–10.3)
Chloride: 100 mmol/L (ref 98–111)
Creatinine, Ser: 0.76 mg/dL (ref 0.44–1.00)
GFR, Estimated: >60 mL/min (ref >60)
Glucose, Bld: 174 mg/dL — ABNORMAL HIGH (ref 70–99)
Potassium: 2.9 mmol/L — ABNORMAL LOW (ref 3.5–5.1)
Sodium: 134 mmol/L — ABNORMAL LOW (ref 135–145)

## 2020-11-20 LAB — CBC
HCT: 34.5 % — ABNORMAL LOW (ref 36.0–46.0)
Hemoglobin: 10.9 g/dL — ABNORMAL LOW (ref 12.0–15.0)
MCH: 29.2 pg (ref 26.0–34.0)
MCHC: 31.6 g/dL (ref 30.0–36.0)
MCV: 92.5 fL (ref 80.0–100.0)
Platelets: 366 10*3/uL (ref 150–400)
RBC: 3.73 MIL/uL — ABNORMAL LOW (ref 3.87–5.11)
RDW: 13.6 % (ref 11.5–15.5)
WBC: 11.8 10*3/uL — ABNORMAL HIGH (ref 4.0–10.5)
nRBC: 0 % (ref 0.0–0.2)

## 2020-11-20 LAB — MAGNESIUM: Magnesium: 1.5 mg/dL — ABNORMAL LOW (ref 1.7–2.4)

## 2020-11-20 LAB — APTT: aPTT: 30 seconds (ref 24–36)

## 2020-11-20 LAB — PHOSPHORUS: Phosphorus: 2.1 mg/dL — ABNORMAL LOW (ref 2.5–4.6)

## 2020-11-20 LAB — PROTIME-INR
INR: 1.1 (ref 0.8–1.2)
Prothrombin Time: 14.6 seconds (ref 11.4–15.2)

## 2020-11-20 MED ORDER — ALPRAZOLAM 0.5 MG PO TABS
0.5000 mg | ORAL_TABLET | Freq: Two times a day (BID) | ORAL | Status: DC | PRN
  Administered 2020-11-20 – 2020-11-21 (×2): 0.5 mg via ORAL
  Filled 2020-11-20 (×2): qty 1

## 2020-11-20 MED ORDER — SERTRALINE HCL 50 MG PO TABS
50.0000 mg | ORAL_TABLET | Freq: Every day | ORAL | Status: DC
  Administered 2020-11-20 – 2020-11-22 (×3): 50 mg via ORAL
  Filled 2020-11-20 (×3): qty 1

## 2020-11-20 MED ORDER — POTASSIUM CHLORIDE 10 MEQ/100ML IV SOLN: 10.0000 meq | INTRAVENOUS | Status: AC

## 2020-11-20 MED ORDER — POTASSIUM CHLORIDE 10 MEQ/100ML IV SOLN: 10.0000 meq | INTRAVENOUS | Status: DC

## 2020-11-20 MED ORDER — PRAVASTATIN SODIUM 40 MG PO TABS
80.0000 mg | ORAL_TABLET | Freq: Every day | ORAL | Status: DC
  Administered 2020-11-20 – 2020-11-21 (×2): 80 mg via ORAL
  Filled 2020-11-20 (×2): qty 2

## 2020-11-20 MED ORDER — PANTOPRAZOLE SODIUM 40 MG PO TBEC
40.0000 mg | DELAYED_RELEASE_TABLET | Freq: Every day | ORAL | Status: DC
  Administered 2020-11-20 – 2020-11-22 (×3): 40 mg via ORAL
  Filled 2020-11-20 (×3): qty 1

## 2020-11-20 MED ORDER — POTASSIUM CHLORIDE CRYS ER 20 MEQ PO TBCR
40.0000 meq | EXTENDED_RELEASE_TABLET | Freq: Every day | ORAL | Status: AC
  Administered 2020-11-21 – 2020-11-22 (×2): 40 meq via ORAL
  Filled 2020-11-20 (×2): qty 2

## 2020-11-20 MED ORDER — COLCHICINE 0.6 MG PO TABS
0.6000 mg | ORAL_TABLET | Freq: Two times a day (BID) | ORAL | Status: DC
  Administered 2020-11-20 – 2020-11-21 (×3): 0.6 mg via ORAL
  Filled 2020-11-20 (×3): qty 1

## 2020-11-20 MED ORDER — ACETAMINOPHEN 325 MG PO TABS
650.0000 mg | ORAL_TABLET | Freq: Four times a day (QID) | ORAL | Status: DC | PRN
  Administered 2020-11-20 (×2): 650 mg via ORAL
  Filled 2020-11-20 (×3): qty 2

## 2020-11-20 MED ORDER — ENOXAPARIN SODIUM 40 MG/0.4ML IJ SOSY
40.0000 mg | PREFILLED_SYRINGE | INTRAMUSCULAR | Status: DC
  Administered 2020-11-20 – 2020-11-22 (×3): 40 mg via SUBCUTANEOUS
  Filled 2020-11-20 (×3): qty 0.4

## 2020-11-20 MED ORDER — POTASSIUM CHLORIDE CRYS ER 20 MEQ PO TBCR
40.0000 meq | EXTENDED_RELEASE_TABLET | Freq: Once | ORAL | Status: AC
  Administered 2020-11-20: 40 meq via ORAL
  Filled 2020-11-20: qty 2

## 2020-11-20 MED ORDER — PREDNISONE 20 MG PO TABS
50.0000 mg | ORAL_TABLET | Freq: Every day | ORAL | Status: AC
  Administered 2020-11-20 – 2020-11-22 (×3): 50 mg via ORAL
  Filled 2020-11-20 (×3): qty 3

## 2020-11-20 MED ORDER — MAGNESIUM SULFATE 4 GM/100ML IV SOLN
4.0000 g | Freq: Once | INTRAVENOUS | Status: AC
  Administered 2020-11-20: 4 g via INTRAVENOUS
  Filled 2020-11-20: qty 100

## 2020-11-20 NOTE — ED Notes (Signed)
Terri Ley, MD messaged re: order for IV potassium, per report pt has not had diarrhea through the night and last Potassium resulted at 3.4, awaiting response

## 2020-11-20 NOTE — ED Notes (Signed)
Pt placed on cardiac monitor while mag infusing on alaris pump

## 2020-11-20 NOTE — Evaluation (Signed)
Physical Therapy Evaluation Patient Details Name: Terri Douglas MRN: [id number] DOB: 1945-07-12 Today's Date: 11/20/2020   History of Present Illness  Terri Douglas is a 75 y.o. female with medical history significant for arthritis GERD, hyperlipidemia, hypertension, gout who presents to the emergency department from her PCPs office due to multiple complaints.  Patient complained of urinary symptoms about a week ago and she presented to an urgent care where she was provided with antibiotic (does not remember name), she was also prescribed with medication for gout flareup on her left big toe, unfortunately, there has been no improvement in gout symptoms since onset of medication.  Patient states that she not have a gout flareup in 5 years, so she stopped taking the gout medication.  She complained of several episodes daily of diarrhea which started since yesterday, this was associated with weakness to the extent that she needed to be carried into the office when she went to PCPs office.  Patient also complained of right hip and right shoulder pain.  She was asked to go to the ED for further evaluation of her symptoms.  She denies fever, chills, vomiting, chest pain or shortness of breath.   Clinical Impression  Patient demonstrates slow labored movement for sitting up at bedside with c/o increasd pain in right hip and left toe, very unsteady on feet and at high risk for falls due to inability to maintain standing balance without an AD and had to use a RW for safety.  Patient demonstrates slow labored cadence and limited mostly due to right hip pain with radiation down RLE requiring standing rest break before ambulating back to room.  Patient put back to bed after therapy.  Patient will benefit from continued physical therapy in hospital and recommended venue below to increase strength, balance, endurance for safe ADLs and gait.      Follow Up Recommendations SNF    Equipment Recommendations  None  recommended by PT    Recommendations for Other Services       Precautions / Restrictions Precautions Precautions: Fall Restrictions Weight Bearing Restrictions: No      Mobility  Bed Mobility Overal bed mobility: Needs Assistance Bed Mobility: Supine to Sit;Sit to Supine     Supine to sit: Min assist;Mod assist Sit to supine: Min assist;Mod assist   General bed mobility comments: slow labored movement; increased pain.    Transfers Overall transfer level: Needs assistance Equipment used: Rolling walker (2 wheeled) Transfers: Sit to/from [facility] Sit to Stand: Min assist;Mod assist Stand pivot transfers: Mod assist       General transfer comment: slow labored movement with increased pain; rest break needed due to shooting pain in R LE.  Ambulation/Gait Ambulation/Gait assistance: Mod assist Gait Distance (Feet): 25 Feet Assistive device: Rolling walker (2 wheeled) Gait Pattern/deviations: Decreased step length - right;Decreased step length - left;Decreased stance time - right;Decreased stance time - left;Antalgic Gait velocity: decreased   General Gait Details: slow labored cadence with c/o increased right hip and left toe pain with radiation down RLE requiring standing rest break with near loss of balance before returning to room  Stairs            Wheelchair Mobility    Modified Rankin (Stroke Patients Only)       Balance Overall balance assessment: Needs assistance Sitting-balance support: No upper extremity supported;Feet supported Sitting balance-Leahy Scale: Good Sitting balance - Comments: seated at EOB   Standing balance support: During functional activity;No upper extremity  supported Standing balance-Leahy Scale: Poor Standing balance comment: fair using RW                             Pertinent Vitals/Pain Pain Assessment: 0-10 Pain Score: 7  Pain Location: left big toe, right hip Pain Descriptors /  Indicators: Grimacing;Guarding;Sore Pain Intervention(s): Limited activity within patient's tolerance;Monitored during session;Repositioned    Home Living Family/patient expects to be discharged to:: Private residence Living Arrangements: Spouse/significant other Available Help at Discharge: Available 24 hours/day Type of Home: House Home Access: Stairs to enter Entrance Stairs-Rails: Left Entrance Stairs-Number of Steps: 4 Home Layout: One level Home Equipment: Walker - 2 wheels;Cane - single point      Prior Function Level of Independence: Independent         Comments: Commnity ambulator, drives and does shopping     Hand Dominance   Dominant Hand: Right    Extremity/Trunk Assessment   Upper Extremity Assessment Upper Extremity Assessment: Defer to OT evaluation RUE Deficits / Details: WFL A/ROM and P/ROM but 3/5 shoulder flexion bilaterally. Pt reports R scapular pain. 4+/5 MMT elbow through grip. RUE Coordination: WNL LUE Deficits / Details: Pt reports previous L reverse shoulder repair. WFL A/ROM and P/ROM but 3/5 shoulder flexion bilaterally. 4+/5 MMT elbow through grip. LUE Coordination: WNL    Lower Extremity Assessment Lower Extremity Assessment: Generalized weakness;RLE deficits/detail RLE Deficits / Details: grossly 3+/5 RLE: Unable to fully assess due to pain RLE Sensation: WNL RLE Coordination: WNL    Cervical / Trunk Assessment Cervical / Trunk Assessment: Normal  Communication   Communication: HOH  Cognition Arousal/Alertness: Awake/alert Behavior During Therapy: WFL for tasks assessed/performed Overall Cognitive Status: Within Functional Limits for tasks assessed                                        General Comments      Exercises     Assessment/Plan    PT Assessment Patient needs continued PT services  PT Problem List Decreased strength;Decreased activity tolerance;Decreased balance;Decreased mobility;Pain        PT Treatment Interventions DME instruction;Gait training;Stair training;Functional mobility training;Therapeutic activities;Therapeutic exercise;Balance training;Patient/family education    PT Goals (Current goals can be found in the Care Plan section)  Acute Rehab PT Goals Patient Stated Goal: return home PT Goal Formulation: With patient Time For Goal Achievement: 12/04/20 Potential to Achieve Goals: Good    Frequency Min 3X/week   Barriers to discharge        Co-evaluation PT/OT/SLP Co-Evaluation/Treatment: Yes Reason for Co-Treatment: To address functional/ADL transfers PT goals addressed during session: Mobility/safety with mobility;Balance;Proper use of DME OT goals addressed during session: Strengthening/ROM;ADL's and self-care       AM-PAC PT "6 Clicks" Mobility  Outcome Measure Help needed turning from your back to your side while in a flat bed without using bedrails?: A Lot Help needed moving from lying on your back to sitting on the side of a flat bed without using bedrails?: A Lot Help needed moving to and from a bed to a chair (including a wheelchair)?: A Lot Help needed standing up from a chair using your arms (e.g., wheelchair or bedside chair)?: A Lot Help needed to walk in hospital room?: A Lot Help needed climbing 3-5 steps with a railing? : A Lot 6 Click Score: 12    End of Session  Activity Tolerance: Patient tolerated treatment well;Patient limited by fatigue;Patient limited by pain Patient left: in bed Nurse Communication: Mobility status PT Visit Diagnosis: Unsteadiness on feet (R26.81);Muscle weakness (generalized) (M62.81);Other abnormalities of gait and mobility (R26.89)    Time: 2010-0712 PT Time Calculation (min) (ACUTE ONLY): 23 min   Charges:   PT Evaluation $PT Eval Moderate Complexity: 1 Mod PT Treatments $Therapeutic Activity: 23-37 mins        10:46 AM, 11/20/20 Lonell Grandchild, MPT Physical Therapist with [facility] [phone] office [phone] mobile phone

## 2020-11-20 NOTE — ED Notes (Signed)
Pt at bedside

## 2020-11-20 NOTE — Plan of Care (Signed)
  Problem: Acute Rehab PT Goals(only PT should resolve) Goal: Pt Will Go Supine/Side To Sit Outcome: Progressing Flowsheets (Taken 11/20/2020 1048) Pt will go Supine/Side to Sit:  with min guard assist  with minimal assist Goal: Patient Will Transfer Sit To/From Stand Outcome: Progressing Flowsheets (Taken 11/20/2020 1048) Patient will transfer sit to/from stand:  with min guard assist  with minimal assist Goal: Pt Will Transfer Bed To Chair/Chair To Bed Outcome: Progressing Flowsheets (Taken 11/20/2020 1048) Pt will Transfer Bed to Chair/Chair to Bed:  min guard assist  with min assist Goal: Pt Will Ambulate Outcome: Progressing Flowsheets (Taken 11/20/2020 1048) Pt will Ambulate:  50 feet  with minimal assist  with rolling walker   10:48 AM, 11/20/20 Lonell Grandchild, MPT Physical Therapist with [facility] [phone] office [phone] mobile phone

## 2020-11-20 NOTE — ED Notes (Signed)
Emokpae, MD at bedside 

## 2020-11-20 NOTE — Evaluation (Signed)
Occupational Therapy Evaluation Patient Details Name: Terri Douglas MRN: [id number] DOB: [birth year] Today's Date: 11/20/2020    History of Present Illness Terri Douglas is a 75 y.o. female with medical history significant for arthritis GERD, hyperlipidemia, hypertension, gout who presents to the emergency department from her PCPs office due to multiple complaints.  Patient complained of urinary symptoms about a week ago and she presented to an urgent care where she was provided with antibiotic (does not remember name), she was also prescribed with medication for gout flareup on her left big toe, unfortunately, there has been no improvement in gout symptoms since onset of medication.  Patient states that she not have a gout flareup in 5 years, so she stopped taking the gout medication.  She complained of several episodes daily of diarrhea which started since yesterday, this was associated with weakness to the extent that she needed to be carried into the office when she went to PCPs office.  Patient also complained of right hip and right shoulder pain.  She was asked to go to the ED for further evaluation of her symptoms.  She denies fever, chills, vomiting, chest pain or shortness of breath.   Clinical Impression   Pt agreeable to OT/PT co-evaluation. Pt reporting 7/10 great toe pain in L LE. Pt required min to mod A for bed mobility. Mod A needed for functional ambulation with RW due to extended time and labored movement. Pt reports that her husband would not be able to help her transfer. Pt demonstrates poor shoulder strength with a history of previous shoulder injuries. Pt will benefit from continued OT in the hospital and recommended venue below to increase strength, balance, and endurance for safe ADL's.     Follow Up Recommendations  SNF;Supervision - Intermittent    Equipment Recommendations  None recommended by OT           Precautions / Restrictions Precautions Precautions:  Fall Restrictions Weight Bearing Restrictions: No      Mobility Bed Mobility Overal bed mobility: Needs Assistance Bed Mobility: Supine to Sit;Sit to Supine     Supine to sit: Min assist;Mod assist Sit to supine: Min assist;Mod assist   General bed mobility comments: slow labored movement; increased pain.    Transfers Overall transfer level: Needs assistance Equipment used: Rolling walker (2 wheeled) Transfers: Sit to/from [facility] Sit to Stand: Min assist;Mod assist Stand pivot transfers: Mod assist       General transfer comment: slow labored movement with increased pain; rest break needed due to shooting pain in R LE.    Balance Overall balance assessment: Needs assistance Sitting-balance support: No upper extremity supported;Feet supported Sitting balance-Leahy Scale: Good Sitting balance - Comments: seated at EOB   Standing balance support: Bilateral upper extremity supported;During functional activity Standing balance-Leahy Scale: Poor (poor to fair) Standing balance comment: using RW                           ADL either performed or assessed with clinical judgement   ADL Overall ADL's : Needs assistance/impaired                         Toilet Transfer: Moderate assistance;RW;Stand-pivot Toilet Transfer Details (indicate cue type and reason): Partially simulated via Sit to stand and ambulation away from EOB and back using RW with moderate assist. Extended time and labored movement.  Vision Baseline Vision/History: No visual deficits Patient Visual Report: No change from baseline       Perception     Praxis      Pertinent Vitals/Pain Pain Assessment: 0-10 Pain Score: 7  Pain Location: L big toe (Pt also noted to have pain in R hip; reports of arthritis; shooting pain in R LE during ambulation.) Pain Descriptors / Indicators: Grimacing;Guarding Pain Intervention(s): Limited activity within  patient's tolerance;Monitored during session;Repositioned     Hand Dominance Right   Extremity/Trunk Assessment Upper Extremity Assessment Upper Extremity Assessment: Generalized weakness;RUE deficits/detail;LUE deficits/detail RUE Deficits / Details: WFL A/ROM and P/ROM but 3/5 shoulder flexion bilaterally. Pt reports R scapular pain. 4+/5 MMT elbow through grip. RUE Coordination: WNL LUE Deficits / Details: Pt reports previous L reverse shoulder repair. WFL A/ROM and P/ROM but 3/5 shoulder flexion bilaterally. 4+/5 MMT elbow through grip. LUE Coordination: WNL   Lower Extremity Assessment Lower Extremity Assessment: Defer to PT evaluation   Cervical / Trunk Assessment Cervical / Trunk Assessment: Normal   Communication Communication Communication: HOH (Pt reported being hard of hearing, very minimal impact to session.)   Cognition Arousal/Alertness: Awake/alert Behavior During Therapy: WFL for tasks assessed/performed Overall Cognitive Status: Within Functional Limits for tasks assessed                                                      Home Living Family/patient expects to be discharged to:: Private residence Living Arrangements: Spouse/significant other Available Help at Discharge: Available 24 hours/day Type of Home: House Home Access: Stairs to enter [employer] of Steps: 4 Entrance Stairs-Rails: Left Home Layout: One level     Bathroom Shower/Tub: Walk-in shower;Tub/shower unit   Bathroom Toilet: Standard Bathroom Accessibility: Yes How Accessible: Accessible via walker Home Equipment: [place] - 2 wheels;Cane - single point          Prior Functioning/Environment Level of Independence: Independent                 OT Problem List: Decreased activity tolerance;Impaired balance (sitting and/or standing);Decreased strength      OT Treatment/Interventions: Self-care/ADL training;Therapeutic exercise;Therapeutic  activities;Balance training;Patient/family education    OT Goals(Current goals can be found in the care plan section) Acute Rehab OT Goals Patient Stated Goal: return home OT Goal Formulation: With patient Time For Goal Achievement: 12/04/20 Potential to Achieve Goals: Good  OT Frequency: Min 2X/week   Barriers to D/C:            Co-evaluation PT/OT/SLP Co-Evaluation/Treatment: Yes Reason for Co-Treatment: To address functional/ADL transfers   OT goals addressed during session: Strengthening/ROM;ADL's and self-care      AM-PAC OT "6 Clicks" Daily Activity     Outcome Measure Help from another person eating meals?: None Help from another person taking care of personal grooming?: A Little Help from another person toileting, which includes using toliet, bedpan, or urinal?: A Little Help from another person bathing (including washing, rinsing, drying)?: A Little Help from another person to put on and taking off regular upper body clothing?: None Help from another person to put on and taking off regular lower body clothing?: A Little 6 Click Score: 20   End of Session Equipment Utilized During Treatment: Rolling walker  Activity Tolerance: Patient tolerated treatment well Patient left: in bed;with call bell/phone within reach  OT Visit  Diagnosis: Unsteadiness on feet (R26.81);Pain Pain - Right/Left: Left Pain - part of body:  (big toe)                Time: 9407-6808 OT Time Calculation (min): 20 min Charges:  OT General Charges $OT Visit: 1 Visit OT Evaluation $OT Eval Low Complexity: 1 Low  Golden Emile OT, MOT   Larey Seat 11/20/2020, 10:15 AM

## 2020-11-20 NOTE — Progress Notes (Signed)
Patient Demographics:    Terri Douglas, is a 75 y.o. female, DOB - 01-02-1946, BTD:[id number]  Admit date - 11/19/2020   Admitting Physician Glee Lashomb Denton Brick, MD  Outpatient Primary MD for the patient is Luking, Elayne Snare, MD  LOS - 0   No chief complaint on file.       Subjective:    Terri Douglas today has no fevers,  No chest pain, no further diarrhea no further vomiting, atresia persist,  -  Assessment  & Plan :    Principal Problem:   Ambulatory dysfunction Active Problems:   Hypomagnesemia   Hyponatremia   Hypokalemia   Generalized weakness   Hyperglycemia   Gout   Dehydration   Arthritis   Diarrhea   Leukocytosis  Brief Summary:- 75 y.o. female with medical history significant for arthritis GERD, hyperlipidemia, hypertension, gout admitted with generalized weakness and ambulatory dysfunction in the setting of gout flareup and diarrhea  A/p 1)Recurrent falls/ambulatory dysfunction--- PT eval appreciated recommends SNF rehab -Recurrent falls---PTA pt lived at home and did very poorly, patient has significant limitations with mobility related ADLs- this patient needs to continue to be monitored in the hospital until a SNF bed is obtained as she is not safe to go home with her current physcical limitations  2) hyponatremia/hypomagnesemia/hypokalemia/hypophosphatemia--- due to dehydration/volume depletion in the setting of diarrhea and torsemide use -Hydrate IV and orally, stop torsemide -Replace electrolytes IV and orally  3)Diarrhea--- stool for C. difficile and GI pathogen pending, -Patient was recently treated with antibiotics  4) polyarthralgia--combination of osteoarthritis and gouty arthritis -Prednisone as ordered -May discontinue colchicine if diarrhea persist  Disposition/Need for in-Hospital Stay- patient unable to be discharged at this time due to --- severe electrolyte  abnormalities requiring IV fluids and IV replacement*  Status is: Inpatient  Remains inpatient appropriate because: See disposition above  Disposition: The patient is from: Home              Anticipated d/c is to: SNF              Anticipated d/c date is: 1 day              Patient currently is not medically stable to d/c. Barriers: Not Clinically Stable-   Code Status :  -  Code Status: Full Code   Family Communication:    NA (patient is alert, awake and coherent)   Consults  :  na  DVT Prophylaxis  :   - SCDs   enoxaparin (LOVENOX) injection 40 mg Start: 11/20/20 1000 SCDs Start: 11/20/20 0020    Lab Results  Component Value Date   PLT 366 11/20/2020    Inpatient Medications  Scheduled Meds:  colchicine  0.6 mg Oral BID   enoxaparin (LOVENOX) injection  40 mg Subcutaneous Q24H   pantoprazole  40 mg Oral Daily   [START ON 11/21/2020] potassium chloride  40 mEq Oral Daily   pravastatin  80 mg Oral q1800   predniSONE  50 mg Oral Q breakfast   sertraline  50 mg Oral Daily   Continuous Infusions: PRN Meds:.acetaminophen, ALPRAZolam    Anti-infectives (From admission, onward)    None         Objective:   Vitals:  11/20/20 1220 11/20/20 1300 11/20/20 1400 11/20/20 1518  BP: 119/61 (!) 117/56 128/62 (!) 159/71  Pulse: 79 95 92 81  Resp: 17 (!) 27 (!) 23   Temp:    98 F (36.7 C)  TempSrc:    Oral  SpO2: 96% 95% 97% 100%  Weight:      Height:        Wt Readings from Last 3 Encounters:  11/19/20 62.1 kg  09/28/20 62.1 kg  05/20/20 63.5 kg     Intake/Output Summary (Last 24 hours) at 11/20/2020 1850 Last data filed at 11/20/2020 1618 Gross per 24 hour  Intake 100 ml  Output 1 ml  Net 99 ml     Physical Exam  Gen:- Awake Alert,  in no apparent distress  HEENT:- [place].AT, No sclera icterus Neck-Supple Neck,No JVD,.  Lungs-  CTAB , fair symmetrical air movement CV- S1, S2 normal, regular  Abd-  +ve B.Sounds, Abd Soft, No tenderness,     Extremity/Skin:- No  edema, pedal pulses present  Psych-affect is appropriate, oriented x3 Neuro-generalized weakness, no new focal deficits, no tremors MSK--- polyarthralgia   Data Review:   Micro Results Recent Results (from the past 240 hour(s))  Culture, blood (routine x 2)     Status: None (Preliminary result)   Collection Time: 11/19/20  5:35 PM   Specimen: BLOOD  Result Value Ref Range Status   Specimen Description BLOOD RIGHT ANTECUBITAL  Final   Special Requests   Final    BOTTLES DRAWN AEROBIC AND ANAEROBIC Blood Culture adequate volume   Culture   Final    NO GROWTH < 12 HOURS Performed at [facility], [address] [place], [place] [address]    Report Status PENDING  Incomplete  Culture, blood (routine x 2)     Status: None (Preliminary result)   Collection Time: 11/19/20  5:40 PM   Specimen: BLOOD LEFT HAND  Result Value Ref Range Status   Specimen Description BLOOD LEFT HAND  Final   Special Requests   Final    BOTTLES DRAWN AEROBIC AND ANAEROBIC Blood Culture adequate volume   Culture   Final    NO GROWTH < 12 HOURS Performed at [facility], [address], [place], [place] [address]    Report Status PENDING  Incomplete  Resp Panel by RT-PCR (Flu A&B, Covid) Nasopharyngeal Swab     Status: None   Collection Time: 11/19/20 10:43 PM   Specimen: Nasopharyngeal Swab; Nasopharyngeal(NP) swabs in vial transport medium  Result Value Ref Range Status   SARS Coronavirus 2 by RT PCR NEGATIVE NEGATIVE Final    Comment: (NOTE) SARS-CoV-2 target nucleic acids are NOT DETECTED.  The SARS-CoV-2 RNA is generally detectable in upper respiratory specimens during the acute phase of infection. The lowest concentration of SARS-CoV-2 viral copies this assay can detect is 138 copies/mL. A negative result does not preclude SARS-Cov-2 infection and should not be used as the sole basis for treatment or other patient management decisions. A negative result may occur with   improper specimen collection/handling, submission of specimen other than nasopharyngeal swab, presence of viral mutation(s) within the areas targeted by this assay, and inadequate number of viral copies(<138 copies/mL). A negative result must be combined with clinical observations, patient history, and epidemiological information. The expected result is Negative.  Fact Sheet for Patients:  EntrepreneurPulse.com.au  Fact Sheet for Healthcare Providers:  IncredibleEmployment.be  This test is no t yet approved or cleared by the [place] and  has been authorized for detection and/or diagnosis of SARS-CoV-2 by FDA under an Emergency Use Authorization (EUA). This EUA will remain  in effect (meaning this test can be used) for the duration of the COVID-19 declaration under Section 564(b)(1) of the Act, 21 U.S.C.section 360bbb-3(b)(1), unless the authorization is terminated  or revoked sooner.       Influenza A by PCR NEGATIVE NEGATIVE Final   Influenza B by PCR NEGATIVE NEGATIVE Final    Comment: (NOTE) The Xpert Xpress SARS-CoV-2/FLU/RSV plus assay is intended as an aid in the diagnosis of influenza from Nasopharyngeal swab specimens and should not be used as a sole basis for treatment. Nasal washings and aspirates are unacceptable for Xpert Xpress SARS-CoV-2/FLU/RSV testing.  Fact Sheet for Patients: EntrepreneurPulse.com.au  Fact Sheet for Healthcare Providers: IncredibleEmployment.be  This test is not yet approved or cleared by the [place] FDA and has been authorized for detection and/or diagnosis of SARS-CoV-2 by FDA under an Emergency Use Authorization (EUA). This EUA will remain in effect (meaning this test can be used) for the duration of the COVID-19 declaration under Section 564(b)(1) of the Act, 21 U.S.C. section 360bbb-3(b)(1), unless the authorization is terminated  or revoked.  Performed at [facility], [address], [place], [place] [address]     Radiology Reports DG Chest 1 View  Result Date: 11/19/2020 CLINICAL DATA:  Right scapula pain EXAM: CHEST  1 VIEW COMPARISON:  03/13/2020 FINDINGS: Surgical hardware in the cervical spine. No focal opacity or pleural effusion. Left shoulder replacement. Normal cardiomediastinal silhouette. No pneumothorax. IMPRESSION: No active disease. Electronically Signed   By: Donavan Foil M.D.   On: 11/19/2020 23:33   CT Renal Stone Study  Result Date: 11/19/2020 CLINICAL DATA:  Back pain EXAM: CT ABDOMEN AND PELVIS WITHOUT CONTRAST TECHNIQUE: Multidetector CT imaging of the abdomen and pelvis was performed following the standard protocol without IV contrast. COMPARISON:  CT 02/15/2011 FINDINGS: Lower chest: Lung bases demonstrate no acute consolidation or effusion. Normal cardiac size. Hepatobiliary: No focal liver abnormality is seen. Status post cholecystectomy. No biliary dilatation. Pancreas: Unremarkable. No pancreatic ductal dilatation or surrounding inflammatory changes. Spleen: Normal in size without focal abnormality. Adrenals/Urinary Tract: Adrenal glands are normal. Prominent renal pelvises without hydronephrosis. Negative for urinary tract calculus. Bladder is normal Stomach/Bowel: Stomach is within normal limits. Appendix appears normal. No evidence of bowel wall thickening, distention, or inflammatory changes. Scattered colon diverticula. Vascular/Lymphatic: Mild aortic atherosclerosis without aneurysm. No suspicious nodes. Reproductive: Status post hysterectomy. No adnexal masses. Other: Negative for free air or free fluid Musculoskeletal: No acute or suspicious osseous abnormality. IMPRESSION: 1. No CT evidence for acute intraabdominal or intrapelvic abnormality 2. Scattered colon diverticula without acute inflammatory change Electronically Signed   By: Donavan Foil M.D.   On: 11/19/2020 19:58   DG HIP UNILAT  WITH PELVIS 2-3 VIEWS RIGHT  Result Date: 11/19/2020 CLINICAL DATA:  Right hip pain EXAM: DG HIP (WITH OR WITHOUT PELVIS) 2-3V RIGHT COMPARISON:  01/14/2020 FINDINGS: Pubic symphysis and rami are intact. No fracture or malalignment. Mild arthritis of the right hip. IMPRESSION: No acute osseous abnormality.  Arthritis of the right hip Electronically Signed   By: Donavan Foil M.D.   On: 11/19/2020 23:33     CBC Recent Labs  Lab 11/19/20 1608 11/20/20 0443  WBC 13.4* 11.8*  HGB 12.1 10.9*  HCT 38.3 34.5*  PLT 400 366  MCV 91.4 92.5  MCH 28.9 29.2  MCHC 31.6 31.6  RDW 13.4 13.6  LYMPHSABS  1.0  --   MONOABS 1.1*  --   EOSABS 0.0  --   BASOSABS 0.0  --     Chemistries  Recent Labs  Lab 11/19/20 1608 11/20/20 0443 11/20/20 1157  NA 131* 137 134*  K 3.0* 3.4* 2.9*  CL 93* 100 100  CO2 27 27 26   GLUCOSE 114* 104* 174*  BUN 14 12 14   CREATININE 0.91 0.80 0.76  CALCIUM 8.3* 8.0* 8.4*  MG  --  1.5*  --   AST 24 20  --   ALT 22 17  --   ALKPHOS 110 93  --   BILITOT 0.8 0.8  --    ------------------------------------------------------------------------------------------------------------------ No results for input(s): CHOL, HDL, LDLCALC, TRIG, CHOLHDL, LDLDIRECT in the last 72 hours.  Lab Results  Component Value Date   HGBA1C 5.9 (H) 05/21/2020   ------------------------------------------------------------------------------------------------------------------ No results for input(s): TSH, T4TOTAL, T3FREE, THYROIDAB in the last 72 hours.  Invalid input(s): FREET3 ------------------------------------------------------------------------------------------------------------------ No results for input(s): VITAMINB12, FOLATE, FERRITIN, TIBC, IRON, RETICCTPCT in the last 72 hours.  Coagulation profile Recent Labs  Lab 11/20/20 0443  INR 1.1    No results for input(s): DDIMER in the last 72 hours.  Cardiac Enzymes No results for input(s): CKMB, TROPONINI, MYOGLOBIN in  the last 168 hours.  Invalid input(s): CK ------------------------------------------------------------------------------------------------------------------ No results found for: BNP   Roxan Hockey M.D on 11/20/2020 at 6:50 PM  Go to [email or web address] - for contact info  [facility] - Office  [phone]

## 2020-11-20 NOTE — Plan of Care (Signed)
  Problem: Acute Rehab OT Goals (only OT should resolve) Goal: Pt. Will Perform Grooming Flowsheets (Taken 11/20/2020 1019) Pt Will Perform Grooming:  with modified independence  standing  with adaptive equipment Goal: Pt. Will Perform Lower Body Dressing Flowsheets (Taken 11/20/2020 1019) Pt Will Perform Lower Body Dressing:  sitting/lateral leans  sit to/from stand  with adaptive equipment  with supervision Goal: Pt. Will Transfer To Toilet Flowsheets (Taken 11/20/2020 1019) Pt Will Transfer to Toilet:  with modified independence  ambulating  stand pivot transfer Goal: Pt/Caregiver Will Perform Home Exercise Program Flowsheets (Taken 11/20/2020 1019) Pt/caregiver will Perform Home Exercise Program:  Increased strength  Independently  Both right and left upper extremity  Jenniger Figiel OT, MOT

## 2020-11-20 NOTE — ED Notes (Signed)
Emokpae, MD verbal to admin oral Potassium and he will order IV Potassium later today

## 2020-11-20 NOTE — [place] FL2 (Signed)
[place] MEDICAID FL2 LEVEL OF CARE SCREENING TOOL     IDENTIFICATION  Patient Name: Terri Douglas Birthdate: February 02, 1946 Sex: female Admission Date (Current Location): 11/19/2020  [facility] and [place] Number:  [employer] and Address:  [facility] [address], [place]      Provider Number: [id number]  Attending Physician Name and Address:  Roxan Hockey, MD  Relative Name and Phone Number:  [id number],[phone] (Spouse)   [phone]    Current Level of Care: SNF Recommended Level of Care: [place] Prior Approval Number:    Date Approved/Denied: 11/20/20 PASRR Number: [id number]  Discharge Plan: SNF    Current Diagnoses: Patient Active Problem List   Diagnosis Date Noted   Arthritis 11/20/2020   Diarrhea 11/20/2020   Hyponatremia 11/20/2020   Hypokalemia 11/20/2020   Generalized weakness 11/20/2020   Leukocytosis 11/20/2020   Dehydration 11/19/2020   Laryngopharyngeal reflux (LPR) 09/28/2020   Primary osteoarthritis of right hip 01/17/2020   Hip pain, bilateral 01/14/2020   Decreased hearing 01/14/2020   Pedal edema 11/19/2019   Hoarseness    Constipation    Other dysphagia    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   Insomnia 03/15/2019   Abdominal pain, epigastric 05/09/2016   Belching 05/09/2016   Hyperlipidemia 06/18/2014   Hyperglycemia 06/18/2014   Gout 06/18/2014   Osteopenia 10/28/2013   Right foot pain 01/31/2013   Hypertension 03/29/2011   High cholesterol 03/29/2011    Orientation RESPIRATION BLADDER Height & Weight     Self, Time, Situation, Place  Normal Continent Weight: 136 lb 14.5 oz (62.1 kg) Height:  5\' 2"  (157.5 cm)  BEHAVIORAL SYMPTOMS/MOOD NEUROLOGICAL BOWEL NUTRITION STATUS      Continent Diet  AMBULATORY STATUS COMMUNICATION OF NEEDS Skin   Extensive Assist Verbally Normal                       Personal Care Assistance Level of Assistance  Bathing,  Feeding, Dressing Bathing Assistance: Maximum assistance Feeding assistance: Independent Dressing Assistance: Maximum assistance     Functional Limitations Info  Sight, Hearing, Speech Sight Info: Adequate Hearing Info: Adequate Speech Info: Adequate    SPECIAL CARE FACTORS FREQUENCY  PT (By licensed PT)     PT Frequency: 5x              Contractures Contractures Info: Not present    Additional Factors Info  Code Status, Allergies Code Status Info: Full Allergies Info: Ibuprofen, Phenobarbital           Current Medications (11/20/2020):  This is the current hospital active medication list Current Facility-Administered Medications  Medication Dose Route Frequency Provider Last Rate Last Admin   acetaminophen (TYLENOL) tablet 650 mg  650 mg Oral Q6H PRN Adefeso, Oladapo, DO   650 mg at 11/20/20 2952   colchicine tablet 0.6 mg  0.6 mg Oral BID Adefeso, Oladapo, DO   0.6 mg at 11/20/20 0844   enoxaparin (LOVENOX) injection 40 mg  40 mg Subcutaneous Q24H Adefeso, Oladapo, DO   40 mg at 11/20/20 8413   Current Outpatient Medications  Medication Sig Dispense Refill   ALPRAZolam (XANAX) 1 MG tablet TAKE ONE TABLET BY MOUTH AT BEDTIME AS NEEDED (Patient taking differently: Take 1 mg by mouth at bedtime as needed for anxiety.) 90 tablet 1   Biotin 5000 MCG TABS Take 5,000 mcg by mouth daily.     estradiol (ESTRACE) 2 MG tablet Take 2 mg  by mouth daily.      finasteride (PROSCAR) 5 MG tablet Take 2.5 mg by mouth daily.   4   Polyethyl Glycol-Propyl Glycol (LUBRICANT EYE DROPS) 0.4-0.3 % SOLN Place 1-2 drops into both eyes daily as needed (dry/irritated eyes.).     potassium chloride (KLOR-CON) 10 MEQ tablet Take 2 tablet in the AM and 2 tablets in the PM 360 tablet 1   pravastatin (PRAVACHOL) 80 MG tablet TAKE ONE (1) TABLET BY MOUTH EVERY DAY (Patient taking differently: Take 80 mg by mouth daily.) 90 tablet 1   Probiotic Product (PHILLIPS COLON HEALTH PO) Take 1 capsule by  mouth daily as needed (irritable bowel).     sertraline (ZOLOFT) 50 MG tablet Take one tablet daily (Patient taking differently: Take 50 mg by mouth daily.) 90 tablet 1   torsemide (DEMADEX) 20 MG tablet Take 1 tablet (20 mg total) by mouth every morning. 90 tablet 1   colchicine 0.6 MG tablet Take 1 tablet (0.6 mg total) by mouth daily. (Patient not taking: Reported on 11/20/2020) 30 tablet 0   esomeprazole (NEXIUM) 40 MG capsule Take 1 capsule (40 mg total) by mouth daily at 12 noon. (Patient not taking: No sig reported) 90 capsule 3   fluticasone (FLONASE) 50 MCG/ACT nasal spray Place 1 spray into both nostrils 2 (two) times daily. 9.9 mL 2     Discharge Medications: Please see discharge summary for a list of discharge medications.  Relevant Imaging Results:  Relevant Lab Results:   Additional Information Pt SSN: [id number]  Natasha Bence, LCSW

## 2020-11-21 ENCOUNTER — Inpatient Hospital Stay: Admit: 2020-11-21 | Payer: MEDICARE | Primary: Physician Assistant

## 2020-11-21 DIAGNOSIS — E039 Hypothyroidism, unspecified: Secondary | ICD-10-CM

## 2020-11-21 DIAGNOSIS — R262 Difficulty in walking, not elsewhere classified: Secondary | ICD-10-CM | POA: Diagnosis not present

## 2020-11-21 LAB — METABOLIC PANEL, COMPREHENSIVE
A-G Ratio: 1.1 (ref 0.8–1.7)
ALT (SGPT): 22 U/L (ref 13–56)
AST (SGOT): 22 U/L (ref 10–38)
Albumin: 3.8 g/dL (ref 3.4–5.0)
Alk. phosphatase: 114 U/L (ref 45–117)
Anion gap: 7 mmol/L (ref 3.0–18)
BUN/Creatinine ratio: 20 (ref 12–20)
BUN: 22 MG/DL — ABNORMAL HIGH (ref 7.0–18)
Bilirubin, total: 0.6 MG/DL (ref 0.2–1.0)
CO2: 31 mmol/L (ref 21–32)
Calcium: 9.8 MG/DL (ref 8.5–10.1)
Chloride: 104 mmol/L (ref 100–111)
Creatinine: 1.12 MG/DL (ref 0.6–1.3)
GFR est AA: 58 mL/min/{1.73_m2} — ABNORMAL LOW (ref >60)
GFR est non-AA: 48 mL/min/{1.73_m2} — ABNORMAL LOW (ref >60)
Globulin: 3.4 g/dL (ref 2.0–4.0)
Glucose: 107 mg/dL — ABNORMAL HIGH (ref 74–99)
Potassium: 3.6 mmol/L (ref 3.5–5.5)
Protein, total: 7.2 g/dL (ref 6.4–8.2)
Sodium: 142 mmol/L (ref 136–145)

## 2020-11-21 LAB — CBC WITH AUTOMATED DIFF
ABS. BASOPHILS: 0 10*3/uL (ref 0.0–0.1)
ABS. EOSINOPHILS: 0.3 10*3/uL (ref 0.0–0.4)
ABS. IMM. GRANS.: 0 10*3/uL (ref 0.00–0.04)
ABS. LYMPHOCYTES: 1.7 10*3/uL (ref 0.9–3.6)
ABS. MONOCYTES: 0.5 10*3/uL (ref 0.05–1.2)
ABS. NEUTROPHILS: 3.5 10*3/uL (ref 1.8–8.0)
ABSOLUTE NRBC: 0 10*3/uL (ref 0.00–0.01)
BASOPHILS: 1 % (ref 0–2)
EOSINOPHILS: 5 % (ref 0–5)
HCT: 43 % (ref 35.0–45.0)
HGB: 14.1 g/dL (ref 12.0–16.0)
IMMATURE GRANULOCYTES: 1 % — ABNORMAL HIGH (ref 0.0–0.5)
LYMPHOCYTES: 28 % (ref 21–52)
MCH: 29.3 PG (ref 24.0–34.0)
MCHC: 32.8 g/dL (ref 31.0–37.0)
MCV: 89.2 FL (ref 78.0–100.0)
MONOCYTES: 8 % (ref 3–10)
MPV: 11.1 FL (ref 9.2–11.8)
NEUTROPHILS: 58 % (ref 40–73)
NRBC: 0 PER 100 WBC
PLATELET: 200 10*3/uL (ref 135–420)
RBC: 4.82 M/uL (ref 4.20–5.30)
RDW: 12.9 % (ref 11.6–14.5)
WBC: 6.1 10*3/uL (ref 4.6–13.2)

## 2020-11-21 LAB — LIPID PANEL
CHOL/HDL Ratio: 3.3 (ref 0–5.0)
Chol/HDL Ratio: 3.3 (ref 0–5.0)
Cholesterol, Total: 190 MG/DL (ref <200)
Cholesterol, total: 190 MG/DL (ref <200)
HDL Cholesterol: 57 MG/DL (ref 40–60)
HDL: 57 MG/DL (ref 40–60)
LDL Calculated: 112.6 MG/DL — ABNORMAL HIGH (ref 0–100)
LDL, calculated: 112.6 MG/DL — ABNORMAL HIGH (ref 0–100)
Triglyceride: 102 MG/DL (ref <150)
Triglycerides: 102 MG/DL (ref <150)
VLDL Cholesterol Calculated: 20.4 MG/DL
VLDL, calculated: 20.4 MG/DL

## 2020-11-21 LAB — TSH AND FREE T4
T4, Free: 1.4 NG/DL (ref 0.7–1.5)
TSH: 0.61 u[IU]/mL (ref 0.36–3.74)

## 2020-11-21 LAB — VITAMIN D, 25 HYDROXY: Vitamin D 25-Hydroxy: 38.7 ng/mL (ref 30–100)

## 2020-11-21 LAB — COMPREHENSIVE METABOLIC PANEL
ALT: 22 U/L (ref 13–56)
AST: 22 U/L (ref 10–38)
Albumin/Globulin Ratio: 1.1 (ref 0.8–1.7)
Albumin: 3.8 g/dL (ref 3.4–5.0)
Alkaline Phosphatase: 114 U/L (ref 45–117)
Anion Gap: 7 mmol/L (ref 3.0–18)
BUN: 22 MG/DL — ABNORMAL HIGH (ref 7.0–18)
Bun/Cre Ratio: 20 (ref 12–20)
CO2: 31 mmol/L (ref 21–32)
Calcium: 9.8 MG/DL (ref 8.5–10.1)
Chloride: 104 mmol/L (ref 100–111)
Creatinine: 1.12 MG/DL (ref 0.6–1.3)
EGFR IF NonAfrican American: 48 mL/min/{1.73_m2} — ABNORMAL LOW (ref >60)
GFR African American: 58 mL/min/{1.73_m2} — ABNORMAL LOW (ref >60)
Globulin: 3.4 g/dL (ref 2.0–4.0)
Glucose: 107 mg/dL — ABNORMAL HIGH (ref 74–99)
Potassium: 3.6 mmol/L (ref 3.5–5.5)
Sodium: 142 mmol/L (ref 136–145)
Total Bilirubin: 0.6 MG/DL (ref 0.2–1.0)
Total Protein: 7.2 g/dL (ref 6.4–8.2)

## 2020-11-21 LAB — CBC WITH AUTO DIFFERENTIAL
Basophils %: 1 % (ref 0–2)
Basophils Absolute: 0 10*3/uL (ref 0.0–0.1)
Eosinophils %: 5 % (ref 0–5)
Eosinophils Absolute: 0.3 10*3/uL (ref 0.0–0.4)
Granulocyte Absolute Count: 0 10*3/uL (ref 0.00–0.04)
Hematocrit: 43 % (ref 35.0–45.0)
Hemoglobin: 14.1 g/dL (ref 12.0–16.0)
Immature Granulocytes: 1 % — ABNORMAL HIGH (ref 0.0–0.5)
Lymphocytes %: 28 % (ref 21–52)
Lymphocytes Absolute: 1.7 10*3/uL (ref 0.9–3.6)
MCH: 29.3 PG (ref 24.0–34.0)
MCHC: 32.8 g/dL (ref 31.0–37.0)
MCV: 89.2 FL (ref 78.0–100.0)
MPV: 11.1 FL (ref 9.2–11.8)
Monocytes %: 8 % (ref 3–10)
Monocytes Absolute: 0.5 10*3/uL (ref 0.05–1.2)
NRBC Absolute: 0 10*3/uL (ref 0.00–0.01)
Neutrophils %: 58 % (ref 40–73)
Neutrophils Absolute: 3.5 10*3/uL (ref 1.8–8.0)
Nucleated RBCs: 0 PER 100 WBC
Platelets: 200 10*3/uL (ref 135–420)
RBC: 4.82 M/uL (ref 4.20–5.30)
RDW: 12.9 % (ref 11.6–14.5)
WBC: 6.1 10*3/uL (ref 4.6–13.2)

## 2020-11-21 LAB — TSH + FREE T4 PANEL
T4 Free: 1.4 NG/DL (ref 0.7–1.5)
TSH: 0.61 u[IU]/mL (ref 0.36–3.74)

## 2020-11-21 LAB — VITAMIN D 25 HYDROXY: Vit D, 25-Hydroxy: 38.7 ng/mL (ref 30–100)

## 2020-11-21 LAB — C DIFFICILE QUICK SCREEN W PCR REFLEX
C Diff antigen: NEGATIVE
C Diff interpretation: NOT DETECTED
C Diff toxin: NEGATIVE

## 2020-11-21 LAB — RENAL FUNCTION PANEL
Albumin: 2.6 g/dL — ABNORMAL LOW (ref 3.5–5.0)
Anion gap: 8 (ref 5–15)
BUN: 15 mg/dL (ref 8–23)
CO2: 25 mmol/L (ref 22–32)
Calcium: 8.5 mg/dL — ABNORMAL LOW (ref 8.9–10.3)
Chloride: 104 mmol/L (ref 98–111)
Creatinine, Ser: 0.71 mg/dL (ref 0.44–1.00)
GFR, Estimated: >60 mL/min (ref >60)
Glucose, Bld: 159 mg/dL — ABNORMAL HIGH (ref 70–99)
Phosphorus: 2.6 mg/dL (ref 2.5–4.6)
Potassium: 4.1 mmol/L (ref 3.5–5.1)
Sodium: 137 mmol/L (ref 135–145)

## 2020-11-21 LAB — URINE CULTURE: Culture: <10000 — AB

## 2020-11-21 MED ORDER — ENSURE ENLIVE PO LIQD: 237.0000 mL | ORAL | Status: DC

## 2020-11-21 MED ORDER — LOPERAMIDE HCL 2 MG PO CAPS
2.0000 mg | ORAL_CAPSULE | Freq: Once | ORAL | Status: AC
  Administered 2020-11-21: 2 mg via ORAL
  Filled 2020-11-21: qty 1

## 2020-11-21 MED ORDER — METHYLPREDNISOLONE SODIUM SUCC 125 MG IJ SOLR
125.0000 mg | Freq: Once | INTRAMUSCULAR | Status: AC
  Administered 2020-11-21: 125 mg via INTRAVENOUS
  Filled 2020-11-21: qty 2

## 2020-11-21 MED ORDER — PROSOURCE PLUS PO LIQD
30.0000 mL | Freq: Every day | ORAL | Status: DC
  Administered 2020-11-22: 30 mL via ORAL
  Filled 2020-11-21: qty 30

## 2020-11-21 MED ORDER — ADULT MULTIVITAMIN W/MINERALS CH
1.0000 | ORAL_TABLET | Freq: Every day | ORAL | Status: DC
  Administered 2020-11-21 – 2020-11-22 (×2): 1 via ORAL
  Filled 2020-11-21 (×2): qty 1

## 2020-11-21 NOTE — TOC Initial Note (Signed)
Transition of Care [facility]) - Initial/Assessment Note    Patient Details  Name: Terri Douglas MRN: [id number] Date of Birth: 1946/04/16  [facility]) CM/SW Contact:    Natasha Bence, LCSW Phone Number: 11/21/2020, 9:59 AM  Clinical Narrative:                 Patient is a18 year old female admitted for Ambulatory dysfunction. CSW conducted initial assessment for patient. Patient reported that at baseline she is able to perform ADL's independently. Patient reported that she experiences weakness because after her previous surgeries, but has since been able to regain strength. Patient agreeable to SNF referral and expressed her preference for [facility], but is agreeable to [facility] SNF referrals also. Patient is fully vaccinated for Covid 19 with booster. CSW placed referral. Keri with [facility] reported that patient did not meet requirements for their facility due to diagnosis. Patient agreeable to [facility] but expressed that she preferred not to go to SNF outside of [facility]. Patient not agreeable to [place]. CSW notified by MD that if patient is able to ambulate Mon. She will be cleared for discharge home with [facility]. CSW not able to discharge to SNF over weekend due to [facility] reporting that they are only able to transport ED patient due to staffing. TOC to follow.   Expected Discharge Plan: Skilled Nursing Facility Barriers to Discharge: Continued Medical Work up   Patient Goals and CMS Choice Patient states their goals for this hospitalization and ongoing recovery are:: Rehab with SNF CMS Medicare.gov Compare Post Acute Care list provided to:: Patient Choice offered to / list presented to : Patient  Expected Discharge Plan and Services Expected Discharge Plan: [place] Choice: [place] arrangements for the past 2 months: Single Family Home                                      Prior Living Arrangements/Services Living  arrangements for the past 2 months: Single Family Home Lives with:: Spouse Patient language and need for interpreter reviewed:: Yes Do you feel safe going back to the place where you live?: Yes      Need for Family Participation in Patient Care: Yes (Comment) Care giver support system in place?: Yes (comment)   Criminal Activity/Legal Involvement Pertinent to Current Situation/Hospitalization: No - Comment as needed  Activities of Daily Living Home Assistive Devices/Equipment: None ADL Screening (condition at time of admission) Patient's cognitive ability adequate to safely complete daily activities?: Yes Is the patient deaf or have difficulty hearing?: No Does the patient have difficulty seeing, even when wearing glasses/contacts?: No Does the patient have difficulty concentrating, remembering, or making decisions?: No Patient able to express need for assistance with ADLs?: Yes Does the patient have difficulty dressing or bathing?: No Independently performs ADLs?: Yes (appropriate for developmental age) Does the patient have difficulty walking or climbing stairs?: Yes Weakness of Legs: Both Weakness of Arms/Hands: Both  Permission Sought/Granted   Permission granted to share information with : Yes, Verbal Permission Granted  Share Information with NAME: Spouse  Permission granted to share info w AGENCY: Local SNF  Permission granted to share info w Relationship: Lucena,Jay  Permission granted to share info w Contact Information: [phone]  Emotional Assessment     Affect (typically observed): Accepting, Adaptable Orientation: : Oriented to Self, Oriented to  Place, Oriented to  Time, Oriented to Situation Alcohol / Substance Use: Not Applicable Psych Involvement: No (comment)  Admission diagnosis:  Dehydration [E86.0] Hypokalemia [E87.6] Right hip pain [M25.551] Generalized weakness [R53.1] Ambulatory dysfunction [R26.2] Patient Active Problem List   Diagnosis Date  Noted   Arthritis 11/20/2020   Diarrhea 11/20/2020   Hyponatremia 11/20/2020   Hypokalemia 11/20/2020   Generalized weakness 11/20/2020   Leukocytosis 11/20/2020   Ambulatory dysfunction 11/20/2020   Hypomagnesemia 11/20/2020   Dehydration 11/19/2020   Laryngopharyngeal reflux (LPR) 09/28/2020   Primary osteoarthritis of right hip 01/17/2020   Hip pain, bilateral 01/14/2020   Decreased hearing 01/14/2020   Pedal edema 11/19/2019   Hoarseness    Constipation    Other dysphagia    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   Insomnia 03/15/2019   Abdominal pain, epigastric 05/09/2016   Belching 05/09/2016   Hyperlipidemia 06/18/2014   Hyperglycemia 06/18/2014   Gout 06/18/2014   Osteopenia 10/28/2013   Right foot pain 01/31/2013   Hypertension 03/29/2011   High cholesterol 03/29/2011   PCP:  Kathyrn Drown, MD Pharmacy:   [facility], [place] [address] Phone: [phone] Fax: [facility] #[id number] [facility], [place] - [address] [place] [address] Phone: [phone] Fax: [phone]  [id number] [facility], [place] [address] [place] [address] Phone: [phone] Fax: [phone]  [facility] [phone] - [place], [address] [place] [address] AT [place] [address] [place] [address] Phone: [phone] Fax: [phone]     Social Determinants of Health (SDOH) Interventions    Readmission Risk Interventions No flowsheet data found.

## 2020-11-21 NOTE — Progress Notes (Signed)
Initial Nutrition Assessment RD working remotely.  DOCUMENTATION CODES:   Not applicable  INTERVENTION:  - will order Ensure Enlive once/day, each supplement provides 350 kcal and 20 grams of protein. - will order 30 ml Prosource Plus once/day, each supplement provides 100 kcal and 15 grams protein.  - will order 1 tablet multivitamin with minerals/day.    NUTRITION DIAGNOSIS:   Increased nutrient needs related to acute illness as evidenced by estimated needs.  GOAL:   Patient will meet greater than or equal to 90% of their needs  MONITOR:   PO intake, Supplement acceptance, Labs, Weight trends  REASON FOR ASSESSMENT:   Malnutrition Screening Tool  ASSESSMENT:   75 y.o. female with medical history of arthritis, GERD, HLD, HTN, and gout. She was admitted with generalized weakness and ambulatory dysfunction in the setting of gout flareup and diarrhea.  No intakes documented since admission.  Patient reported that she has lost 20 lb d/t acute illness. Review of weight records indicates that weight has been stable since 04/03/19.   Non-pitting edema to RLE documented in the edema section of flow sheet.   Per notes: - recurrent falls/ambulatory dysfunction - electrolyte abnormalities on admission - diarrhea--cdif negative - from home with likely plan for SNF at d/c    Labs reviewed; Ca: 8.5 mg/dl. Medications reviewed; 40 mg oral protonix/day, 40 mEq Klor-Con/day, 50 mg deltasone/day.     NUTRITION - FOCUSED PHYSICAL EXAM:  Unable to complete at this time.  Diet Order:   Diet Order             Diet Heart Room service appropriate? Yes; Fluid consistency: Thin  Diet effective now                   EDUCATION NEEDS:   No education needs have been identified at this time  Skin:  Skin Assessment: Reviewed RN Assessment  Last BM:  6/18 (type 6 x2)  Height:   Ht Readings from Last 1 Encounters:  11/19/20 5\' 2"  (1.575 m)    Weight:   Wt Readings  from Last 1 Encounters:  11/19/20 62.1 kg     Estimated Nutritional Needs:  Kcal:  1550-1750 kcal Protein:  70-80 grams Fluid:  >/= 2 L/day       Jarome Matin, MS, RD, LDN, CNSC Inpatient Clinical Dietitian RD pager # available in [facility]  After hours/weekend pager # available in [facility]

## 2020-11-21 NOTE — Progress Notes (Signed)
Patient Demographics:    Terri Douglas, is a 75 y.o. female, DOB - 1946/05/12, SPQ:[id number]  Admit date - 11/19/2020   Admitting Physician Saleem Coccia Denton Brick, MD  Outpatient Primary MD for the patient is Luking, Elayne Snare, MD  LOS - 1   No chief complaint on file.       Subjective:    Terri Douglas today has no fevers,  No chest pain,  ---2 loose stools in the last 12 hours, nausea and poor appetite but no further emesis -arthralgia persist  Assessment  & Plan :    Principal Problem:   Ambulatory dysfunction Active Problems:   Hypomagnesemia   Hyponatremia   Hypokalemia   Generalized weakness   Hyperglycemia   Gout   Dehydration   Arthritis   Diarrhea   Leukocytosis  Brief Summary:- 75 y.o. female with medical history significant for arthritis GERD, hyperlipidemia, hypertension and gout admitted with generalized weakness and ambulatory dysfunction in the setting of gout flareup and diarrhea  A/p 1)Recurrent Falls/Ambulatory Dysfunction--- PT eval appreciated recommends SNF rehab -Recurrent falls---PTA pt lived at home and did very poorly, patient has significant limitations with mobility related ADLs- this patient needs to continue to be monitored in the hospital until a SNF bed is obtained as she is not safe to go home with her current physcical limitations  2)HypoNatremia/hypoMagnesemia/hypoKalemia/hypoPhosphatemia--- due to dehydration/volume depletion in the setting of diarrhea and torsemide use -Electrolyte abnormalities and dehydration improved with IV fluids and replacement of electrolytes -Continue to hold torsemide especially given ongoing diarrhea  3)Diarrhea--- stool for C. difficile is negative and GI pathogen pending, -Patient was recently treated with antibiotics -May use Imodium as needed -Stop colchicine as this could be contributing to her  diarrhea  4)Polyarthralgia--combination of osteoarthritis and gouty arthritis -Prednisone as ordered discontinue colchicine due to diarrhea  Disposition/Need for in-Hospital Stay- patient unable to be discharged at this time due to --- severe electrolyte abnormalities requiring IV fluids and IV replacement, ambulatory dysfunction with high fall risk and need for SNF level rehab  Status is: Inpatient  Remains inpatient appropriate because: See disposition above  Disposition: The patient is from: Home              Anticipated d/c is to: SNF              Anticipated d/c date is: 1 day              Patient currently is not medically stable to d/c. Barriers: Not Clinically Stable-   Code Status :  -  Code Status: Full Code   Family Communication:    NA (patient is alert, awake and coherent)   Consults  :  na  DVT Prophylaxis  :   - SCDs   enoxaparin (LOVENOX) injection 40 mg Start: 11/20/20 1000 SCDs Start: 11/20/20 0020   Lab Results  Component Value Date   PLT 366 11/20/2020    Inpatient Medications  Scheduled Meds:  enoxaparin (LOVENOX) injection  40 mg Subcutaneous Q24H   loperamide  2 mg Oral Once   methylPREDNISolone (SOLU-MEDROL) injection  125 mg Intravenous Once   pantoprazole  40 mg Oral Daily   potassium chloride  40 mEq Oral Daily   pravastatin  80 mg  Oral q1800   predniSONE  50 mg Oral Q breakfast   sertraline  50 mg Oral Daily   Continuous Infusions: PRN Meds:.acetaminophen, ALPRAZolam   Anti-infectives (From admission, onward)    None         Objective:   Vitals:   11/20/20 1518 11/20/20 2150 11/21/20 0457 11/21/20 0700  BP: (!) 159/71 133/65 127/64 114/73  Pulse: 81 88 72 69  Resp:  18 15 16   Temp: 98 F (36.7 C) 99.3 F (37.4 C) 97.7 F (36.5 C) 97.7 F (36.5 C)  TempSrc: Oral Oral Oral Oral  SpO2: 100% 96% 99% 98%  Weight:      Height:        Wt Readings from Last 3 Encounters:  11/19/20 62.1 kg  09/28/20 62.1 kg  05/20/20 63.5  kg    Intake/Output Summary (Last 24 hours) at 11/21/2020 1047 Last data filed at 11/20/2020 2100 Gross per 24 hour  Intake 100 ml  Output 2 ml  Net 98 ml    Physical Exam  Gen:- Awake Alert,  in no apparent distress  HEENT:- [place].AT, No sclera icterus Neck-Supple Neck,No JVD,.  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   Data Review:   Micro Results Recent Results (from the past 240 hour(s))  Culture, blood (routine x 2)     Status: None (Preliminary result)   Collection Time: 11/19/20  5:35 PM   Specimen: BLOOD  Result Value Ref Range Status   Specimen Description BLOOD RIGHT ANTECUBITAL  Final   Special Requests   Final    BOTTLES DRAWN AEROBIC AND ANAEROBIC Blood Culture adequate volume   Culture   Final    NO GROWTH 2 DAYS Performed at [facility], [address], [place], [place] [address]    Report Status PENDING  Incomplete  Culture, blood (routine x 2)     Status: None (Preliminary result)   Collection Time: 11/19/20  5:40 PM   Specimen: BLOOD LEFT HAND  Result Value Ref Range Status   Specimen Description BLOOD LEFT HAND  Final   Special Requests   Final    BOTTLES DRAWN AEROBIC AND ANAEROBIC Blood Culture adequate volume   Culture   Final    NO GROWTH 2 DAYS Performed at [facility], [address], [place], [place] [address]    Report Status PENDING  Incomplete  Resp Panel by RT-PCR (Flu A&B, Covid) Nasopharyngeal Swab     Status: None   Collection Time: 11/19/20 10:43 PM   Specimen: Nasopharyngeal Swab; Nasopharyngeal(NP) swabs in vial transport medium  Result Value Ref Range Status   SARS Coronavirus 2 by RT PCR NEGATIVE NEGATIVE Final    Comment:  (NOTE) SARS-CoV-2 target nucleic acids are NOT DETECTED.  The SARS-CoV-2 RNA is generally detectable in upper respiratory specimens during the acute phase of infection. The lowest concentration of SARS-CoV-2 viral copies this assay can detect is 138 copies/mL. A negative result does not preclude SARS-Cov-2 infection and should not be used as the sole basis for treatment or other patient management decisions. A negative result may occur with  improper specimen collection/handling, submission of specimen other than nasopharyngeal swab, presence of viral mutation(s) within the areas targeted by this  assay, and inadequate number of viral copies(<138 copies/mL). A negative result must be combined with clinical observations, patient history, and epidemiological information. The expected result is Negative.  Fact Sheet for Patients:  EntrepreneurPulse.com.au  Fact Sheet for Healthcare Providers:  IncredibleEmployment.be  This test is no t yet approved or cleared by the [place] FDA and  has been authorized for detection and/or diagnosis of SARS-CoV-2 by FDA under an Emergency Use Authorization (EUA). This EUA will remain  in effect (meaning this test can be used) for the duration of the COVID-19 declaration under Section 564(b)(1) of the Act, 21 U.S.C.section 360bbb-3(b)(1), unless the authorization is terminated  or revoked sooner.       Influenza A by PCR NEGATIVE NEGATIVE Final   Influenza B by PCR NEGATIVE NEGATIVE Final    Comment: (NOTE) The Xpert Xpress SARS-CoV-2/FLU/RSV plus assay is intended as an aid in the diagnosis of influenza from Nasopharyngeal swab specimens and should not be used as a sole basis for treatment. Nasal washings and aspirates are unacceptable for Xpert Xpress SARS-CoV-2/FLU/RSV testing.  Fact Sheet for Patients: EntrepreneurPulse.com.au  Fact Sheet for Healthcare  Providers: IncredibleEmployment.be  This test is not yet approved or cleared by the [place] FDA and has been authorized for detection and/or diagnosis of SARS-CoV-2 by FDA under an Emergency Use Authorization (EUA). This EUA will remain in effect (meaning this test can be used) for the duration of the COVID-19 declaration under Section 564(b)(1) of the Act, 21 U.S.C. section 360bbb-3(b)(1), unless the authorization is terminated or revoked.  Performed at [facility], [address] [place], [place] [address]   C Difficile Quick Screen w PCR reflex     Status: None   Collection Time: 11/21/20  1:34 AM   Specimen: STOOL  Result Value Ref Range Status   C Diff antigen NEGATIVE NEGATIVE Final   C Diff toxin NEGATIVE NEGATIVE Final   C Diff interpretation No C. difficile detected.  Final    Comment: VALID Performed at [facility], [address], [place], [place] [address]     Radiology Reports DG Chest 1 View  Result Date: 11/19/2020 CLINICAL DATA:  Right scapula pain EXAM: CHEST  1 VIEW COMPARISON:  03/13/2020 FINDINGS: Surgical hardware in the cervical spine. No focal opacity or pleural effusion. Left shoulder replacement. Normal cardiomediastinal silhouette. No pneumothorax. IMPRESSION: No active disease. Electronically Signed   By: Donavan Foil M.D.   On: 11/19/2020 23:33   CT Renal Stone Study  Result Date: 11/19/2020 CLINICAL DATA:  Back pain EXAM: CT ABDOMEN AND PELVIS WITHOUT CONTRAST TECHNIQUE: Multidetector CT imaging of the abdomen and pelvis was performed following the standard protocol without IV contrast. COMPARISON:  CT 02/15/2011 FINDINGS: Lower chest: Lung bases demonstrate no acute consolidation or effusion. Normal cardiac size. Hepatobiliary: No focal liver abnormality is seen. Status post cholecystectomy. No biliary dilatation. Pancreas: Unremarkable. No pancreatic ductal dilatation or surrounding inflammatory changes. Spleen: Normal in size  without focal abnormality. Adrenals/Urinary Tract: Adrenal glands are normal. Prominent renal pelvises without hydronephrosis. Negative for urinary tract calculus. Bladder is normal Stomach/Bowel: Stomach is within normal limits. Appendix appears normal. No evidence of bowel wall thickening, distention, or inflammatory changes. Scattered colon diverticula. Vascular/Lymphatic: Mild aortic atherosclerosis without aneurysm. No suspicious nodes. Reproductive: Status post hysterectomy. No adnexal masses. Other: Negative for free air or free fluid Musculoskeletal: No acute or suspicious osseous abnormality. IMPRESSION: 1. No CT evidence for acute intraabdominal or intrapelvic abnormality 2. Scattered colon diverticula without acute inflammatory change  Electronically Signed   By: Donavan Foil M.D.   On: 11/19/2020 19:58   DG HIP UNILAT WITH PELVIS 2-3 VIEWS RIGHT  Result Date: 11/19/2020 CLINICAL DATA:  Right hip pain EXAM: DG HIP (WITH OR WITHOUT PELVIS) 2-3V RIGHT COMPARISON:  01/14/2020 FINDINGS: Pubic symphysis and rami are intact. No fracture or malalignment. Mild arthritis of the right hip. IMPRESSION: No acute osseous abnormality.  Arthritis of the right hip Electronically Signed   By: Donavan Foil M.D.   On: 11/19/2020 23:33     CBC Recent Labs  Lab 11/19/20 1608 11/20/20 0443  WBC 13.4* 11.8*  HGB 12.1 10.9*  HCT 38.3 34.5*  PLT 400 366  MCV 91.4 92.5  MCH 28.9 29.2  MCHC 31.6 31.6  RDW 13.4 13.6  LYMPHSABS 1.0  --   MONOABS 1.1*  --   EOSABS 0.0  --   BASOSABS 0.0  --     Chemistries  Recent Labs  Lab 11/19/20 1608 11/20/20 0443 11/20/20 1157 11/21/20 0538  NA 131* 137 134* 137  K 3.0* 3.4* 2.9* 4.1  CL 93* 100 100 104  CO2 27 27 26 25   GLUCOSE 114* 104* 174* 159*  BUN 14 12 14 15   CREATININE 0.91 0.80 0.76 0.71  CALCIUM 8.3* 8.0* 8.4* 8.5*  MG  --  1.5*  --   --   AST 24 20  --   --   ALT 22 17  --   --   ALKPHOS 110 93  --   --   BILITOT 0.8 0.8  --   --     ------------------------------------------------------------------------------------------------------------------ No results for input(s): CHOL, HDL, LDLCALC, TRIG, CHOLHDL, LDLDIRECT in the last 72 hours.  Lab Results  Component Value Date   HGBA1C 5.9 (H) 05/21/2020   ------------------------------------------------------------------------------------------------------------------ No results for input(s): TSH, T4TOTAL, T3FREE, THYROIDAB in the last 72 hours.  Invalid input(s): FREET3 ------------------------------------------------------------------------------------------------------------------ No results for input(s): VITAMINB12, FOLATE, FERRITIN, TIBC, IRON, RETICCTPCT in the last 72 hours.  Coagulation profile Recent Labs  Lab 11/20/20 0443  INR 1.1   No results for input(s): DDIMER in the last 72 hours.  Cardiac Enzymes No results for input(s): CKMB, TROPONINI, MYOGLOBIN in the last 168 hours.  Invalid input(s): CK ------------------------------------------------------------------------------------------------------------------ No results found for: BNP   Roxan Hockey M.D on 11/21/2020 at 10:47 AM  Go to [email or web address] - for contact info  [facility] - Office  [phone]

## 2020-11-22 DIAGNOSIS — A046 Enteritis due to Yersinia enterocolitica: Secondary | ICD-10-CM | POA: Diagnosis present

## 2020-11-22 DIAGNOSIS — R262 Difficulty in walking, not elsewhere classified: Secondary | ICD-10-CM | POA: Diagnosis not present

## 2020-11-22 LAB — GASTROINTESTINAL PANEL BY PCR, STOOL (REPLACES STOOL CULTURE)

## 2020-11-22 LAB — BASIC METABOLIC PANEL
Anion gap: 7 (ref 5–15)
BUN: 29 mg/dL — ABNORMAL HIGH (ref 8–23)
CO2: 26 mmol/L (ref 22–32)
Calcium: 8.4 mg/dL — ABNORMAL LOW (ref 8.9–10.3)
Chloride: 103 mmol/L (ref 98–111)
Creatinine, Ser: 0.81 mg/dL (ref 0.44–1.00)
GFR, Estimated: >60 mL/min (ref >60)
Glucose, Bld: 178 mg/dL — ABNORMAL HIGH (ref 70–99)
Potassium: 4.7 mmol/L (ref 3.5–5.1)
Sodium: 136 mmol/L (ref 135–145)

## 2020-11-22 MED ORDER — ESOMEPRAZOLE MAGNESIUM 40 MG PO CPDR: 40.0000 mg | DELAYED_RELEASE_CAPSULE | Freq: Every day | ORAL | 3 refills | Status: DC

## 2020-11-22 MED ORDER — METHYLPREDNISOLONE SODIUM SUCC 125 MG IJ SOLR
125.0000 mg | Freq: Once | INTRAMUSCULAR | Status: AC
  Administered 2020-11-22: 125 mg via INTRAVENOUS
  Filled 2020-11-22: qty 2

## 2020-11-22 MED ORDER — LOPERAMIDE HCL 2 MG PO TABS: 2.0000 mg | ORAL_TABLET | Freq: Four times a day (QID) | ORAL | 0 refills | Status: DC | PRN

## 2020-11-22 MED ORDER — TORSEMIDE 20 MG PO TABS: 20.0000 mg | ORAL_TABLET | Freq: Every morning | ORAL | 1 refills | Status: DC

## 2020-11-22 MED ORDER — METHYLPREDNISOLONE SODIUM SUCC 125 MG IJ SOLR: 125.0000 mg | Freq: Once | INTRAMUSCULAR | Status: DC

## 2020-11-22 MED ORDER — PRAVASTATIN SODIUM 80 MG PO TABS: ORAL_TABLET | ORAL | 3 refills | Status: DC

## 2020-11-22 MED ORDER — PREDNISONE 20 MG PO TABS: 40.0000 mg | ORAL_TABLET | Freq: Every day | ORAL | 0 refills | Status: DC

## 2020-11-22 NOTE — Progress Notes (Signed)
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 Joesph Fillers

## 2020-11-22 NOTE — Discharge Instructions (Addendum)
1)Avoid ibuprofen/Advil/Aleve/Motrin/Goody Powders/Naproxen/BC powders/Meloxicam/Diclofenac/Indomethacin and other Nonsteroidal anti-inflammatory medications as these will make you more likely to bleed and can cause stomach ulcers, can also cause Kidney problems.   2) take medications as prescribed  3)follow up with Kathyrn Drown, MD in about 1 week for recheck  4)Physical Therapy will come to your House to Help you get stronger-

## 2020-11-22 NOTE — Progress Notes (Signed)
Physical Therapy Treatment Patient Details Name: Terri Douglas MRN: [id number] DOB: 09-08-1945 Today's Date: 11/22/2020    History of Present Illness Terri Douglas is a 75 y.o. female with medical history significant for arthritis GERD, hyperlipidemia, hypertension, gout who presents to the emergency department from her PCPs office due to multiple complaints.  Patient complained of urinary symptoms about a week ago and she presented to an urgent care where she was provided with antibiotic (does not remember name), she was also prescribed with medication for gout flareup on her left big toe, unfortunately, there has been no improvement in gout symptoms since onset of medication.  Patient states that she not have a gout flareup in 5 years, so she stopped taking the gout medication.  She complained of several episodes daily of diarrhea which started since yesterday, this was associated with weakness to the extent that she needed to be carried into the office when she went to PCPs office.  Patient also complained of right hip and right shoulder pain.  She was asked to go to the ED for further evaluation of her symptoms.  She denies fever, chills, vomiting, chest pain or shortness of breath.    PT Comments    Patient demonstrates improved activity tolerance today and able to perform bed mobility with modified independent requiring increased time due to joint pain/stiffness.  Able to demo transfers with modified independent to set-up assist for AD placement or chair placement to enable BUE support/assistance during pivoting.  Ambulation x 50 ft with RW and gait deviations to decrease WB or loading response to 1st toe due to gouty flare-up but no unsteadiness/LOB experienced.  Pt able to ambulate with RW for offloading and negotiate two turns without much difficulty or LOB.  Pt would benefit from continued therapy whilst hospitalized to increase activity/mobility tolerance and HHPT indicated to progress  ambulation and activity tolerance in context of home environment.    Follow Up Recommendations  Home health PT     Equipment Recommendations  Rolling walker with 5" wheels    Recommendations for Other Services       Precautions / Restrictions      Mobility  Bed Mobility Overal bed mobility: Independent Bed Mobility: Supine to Sit;Sit to Supine     Supine to sit: Modified independent (Device/Increase time) Sit to supine: Modified independent (Device/Increase time)        Transfers Overall transfer level: Modified independent (set-up assist for AD or environment set-up to allow use of BUE for support during pivoting) Equipment used: Rolling walker (2 wheeled) Transfers: Sit to/from [facility] Sit to Stand: Modified independent (Device/Increase time) Stand pivot transfers: Modified independent (Device/Increase time)       General transfer comment: increased time due to left 1st toe pain  Ambulation/Gait Ambulation/Gait assistance: Supervision Gait Distance (Feet): 50 Feet Assistive device: Rolling walker (2 wheeled) Gait Pattern/deviations: Decreased step length - right;Decreased step length - left;Decreased stance time - right;Decreased stance time - left;Antalgic Gait velocity: decreased   General Gait Details: maintains some ankle inversion to limit great toe extension, but no unsteadiness   Stairs             Wheelchair Mobility    Modified Rankin (Stroke Patients Only)       Balance Overall balance assessment: Independent Sitting-balance support: No upper extremity supported;Feet supported Sitting balance-Leahy Scale: Normal Sitting balance - Comments: seated at EOB   Standing balance support: During functional activity;No upper extremity supported Standing balance-Leahy Scale:  Fair (improved tolerance with RW to aid in offloading BLE) Standing balance comment: fair using RW                            Cognition                                               Exercises General Exercises - Lower Extremity Ankle Circles/Pumps: AROM;Both;20 reps Heel Slides: AROM;Both;20 reps Toe Raises: AROM;Both;20 reps Heel Raises: Both;AROM;20 reps    General Comments        Pertinent Vitals/Pain Pain Assessment: 0-10 Pain Score: 4  Pain Location: left big toe, right hip Pain Descriptors / Indicators: Sore Pain Intervention(s): Limited activity within patient's tolerance    Home Living                      Prior Function            PT Goals (current goals can now be found in the care plan section) Acute Rehab PT Goals Patient Stated Goal: return home PT Goal Formulation: With patient Time For Goal Achievement: 12/04/20 Potential to Achieve Goals: Good Progress towards PT goals: Progressing toward goals    Frequency    Min 3X/week      PT Plan Equipment recommendations need to be updated;Discharge plan needs to be updated    Co-evaluation              AM-PAC PT "6 Clicks" Mobility   Outcome Measure  Help needed turning from your back to your side while in a flat bed without using bedrails?: None Help needed moving from lying on your back to sitting on the side of a flat bed without using bedrails?: None Help needed moving to and from a bed to a chair (including a wheelchair)?: A Little (set-up assist for AD placement) Help needed standing up from a chair using your arms (e.g., wheelchair or bedside chair)?: A Lot Help needed to walk in hospital room?: A Little Help needed climbing 3-5 steps with a railing? : A Lot 6 Click Score: 18    End of Session   Activity Tolerance: Patient tolerated treatment well;Patient limited by pain Patient left: in bed Nurse Communication: Mobility status PT Visit Diagnosis: Unsteadiness on feet (R26.81);Muscle weakness (generalized) (M62.81);Other abnormalities of gait and mobility (R26.89)     Time: 1045-1110 PT  Time Calculation (min) (ACUTE ONLY): 25 min  Charges:  $Gait Training: 8-22 mins $Therapeutic Activity: 8-22 mins                     11:19 AM, 11/22/20 M. Sherlyn Lees, PT, DPT Physical Therapist- [place] Office Number: [phone]

## 2020-11-22 NOTE — Plan of Care (Signed)

## 2020-11-22 NOTE — Discharge Summary (Addendum)
Terri Douglas, is a 75 y.o. female  DOB 08/28/1945  MRN [id number].  Admission date:  11/19/2020  Admitting Physician  Lorenso Quirino Denton Brick, MD  Discharge Date:  11/22/2020   Primary MD  Kathyrn Drown, MD  Recommendations for primary care physician for things to follow:   1)Avoid ibuprofen/Advil/Aleve/Motrin/Goody Powders/Naproxen/BC powders/Meloxicam/Diclofenac/Indomethacin and other Nonsteroidal anti-inflammatory medications as these will make you more likely to bleed and can cause stomach ulcers, can also cause Kidney problems.   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   Gout   Diarrhea   Hyponatremia   Hypokalemia   Generalized weakness   Hyperglycemia   Dehydration   Arthritis   Leukocytosis      Past Medical History:  Diagnosis Date   Arthritis    Complication of anesthesia    Gall stones    GERD (gastroesophageal reflux disease)    History of cardiac catheterization 2005   Minor elevation in cardiac enzymes however no significant obstructive CAD   Hyperlipidemia    Hypertension    Insomnia    PONV (postoperative nausea and vomiting)    Prediabetes     Past Surgical History:  Procedure Laterality Date   A-FLUTTER ABLATION N/A 08/01/2017   Procedure: A-FLUTTER ABLATION;  Surgeon: Thompson Grayer, MD;  Location: [facility];   Service: Cardiovascular;  Laterality: N/A;   ABDOMINAL HYSTERECTOMY     BACK SURGERY  1992   CARDIAC CATHETERIZATION     BACK IN 2004  SHE THINKS IT CAME BACK 'NORMAL'   CHOLECYSTECTOMY N/A 06/24/2015   Procedure: LAPAROSCOPIC CHOLECYSTECTOMY;  Surgeon: Mickeal Skinner, MD;  Location: [facility];  Service: General;  Laterality: N/A;   COLONOSCOPY N/A 08/30/2012   Procedure: COLONOSCOPY;  Surgeon: Rogene Houston, MD;  [facility];  Service: Endoscopy;  Laterality: N/A;  [place]     ESOPHAGOGASTRODUODENOSCOPY  04/15/2011   Procedure: ESOPHAGOGASTRODUODENOSCOPY (EGD);  Surgeon: Rogene Houston, MD;  [facility];  Service: Endoscopy;  Laterality: N/A;  11:30   ESOPHAGOGASTRODUODENOSCOPY N/A 05/27/2016   Procedure: ESOPHAGOGASTRODUODENOSCOPY (EGD);  Surgeon: Rogene Houston, MD;  [facility];  Service: Endoscopy;  Laterality: N/A;  11:15   EYE SURGERY     cataract removal   NECK SURGERY  12/13/2018   REVERSE SHOULDER ARTHROPLASTY Left 04/03/2019   Procedure: REVERSE SHOULDER ARTHROPLASTY;  Surgeon: Hiram Gash, MD;  Location: [facility];  Service: Orthopedics;  Laterality: Left;   VAGINAL HYSTERECTOMY  HPI  from the history and physical done on the day of admission:    Chief Complaint: Left Big toe pain, diarrhea and generalized weakness     HPI: Terri Douglas is a 75 y.o. female with medical history significant for arthritis GERD, hyperlipidemia, hypertension, gout who presents to the emergency department from her PCPs office due to multiple complaints.  Patient complained of urinary symptoms about a week ago and she presented to an urgent care where she was provided with antibiotic (does not remember name), she was also prescribed with medication for gout flareup on her left big toe, unfortunately, there has been no improvement in gout symptoms since onset of medication.  Patient states that she not have a gout flareup in 5  years, so she stopped taking the gout medication.  She complained of several episodes daily of diarrhea which started since yesterday, this was associated with weakness to the extent that she needed to be carried into the office when she went to PCPs office.  Patient also complained of right hip and right shoulder pain.  She was asked to go to the ED for further evaluation of her symptoms.  She denies fever, chills, vomiting, chest pain or shortness of breath.   ED Course:  In the emergency department, she was hemodynamically stable.  Work-up in the ED showed leukocytosis, hyponatremia, hypokalemia, urinalysis was positive for ketonuria but was unimpressive for UTI, lactic acid x2 was negative.  Lipase 28.  Influenza A, B, SARS coronavirus was negative. Chest x-ray showed no active disease Right hip x-ray showed no acute osseous abnormality, but showed arthritis of the right hip CT abdomen and pelvis without contrast showed no acute intra-abdominal or intrapelvic abnormality Morphine was given due to pain, IV hydration was provided.  Hospitalist was asked to admit patient for further evaluation and management.     Hospital Course:   Brief Summary:- 75 y.o. female with medical history significant for arthritis GERD, hyperlipidemia, hypertension and gout admitted with generalized weakness and ambulatory dysfunction in the setting of gout flareup and diarrhea   A/p 1)Recurrent Falls/Ambulatory Dysfunction--- PT Re-eval on 11/22/2020 appreciated , patient has improved significantly and PT now recommends discharge home with home health services  --- much improved after treatment of gout and arthralgia with colchicine and steroid   2)HypoNatremia/hypoMagnesemia/hypoKalemia/hypoPhosphatemia--- due to dehydration/volume depletion in the setting of diarrhea and torsemide use -Electrolyte abnormalities and dehydration normalized with IV fluids and replacement of electrolytes -   3)Diarrhea--- stool for  C. difficile is negative and GI pathogen NGTD -Patient was recently treated with antibiotics -May use Imodium as needed -Stopped colchicine as this could be contributing to her diarrhea   4)Polyarthralgia--combination of osteoarthritis and gouty arthritis -Joint pains improved with steroids, okay to discharge home on prednisone as ordered discontinued colchicine due to diarrhea  5)Yersinia Enterocolitica Enterocolitis--- diarrhea has resolved with supportive measures, no further treatment warranted at this time Although treatment appears not to impact mild intestinal disease, fecal shedding decreases following antimicrobial treatment . This by itself does not justify treatment, as person-to-person transmission is rare.  --Clinical trials have Not demonstrated a clinical benefit from treatment, though, in treated groups, the pathogen was rapidly cleared from feces.  -In severe cases if treatment is warranted Bactrim, quinolone or doxycycline options There are no controlled trials that indicate that antimicrobial treatment of acute, uncomplicated yersiniosis is beneficial.    Disposition=== Home with home health services   Disposition: The patient is from: Home  Anticipated d/c is to: Home with home health services               Code Status :  -  Code Status: Full Code    Family Communication:    NA (patient is alert, awake and coherent)   Consults  :  na  Discharge Condition: stable  Follow UP   Follow-up Information     [facility] Follow up.   Why: PT and RN Contact information: [address] [place] [id number]                 Consults obtained - na  Diet and Activity recommendation:  As advised  Discharge Instructions   Discharge Instructions     Call MD for:  difficulty breathing, headache or visual disturbances   Complete by: As directed    Call MD for:  persistant dizziness or light-headedness    Complete by: As directed    Call MD for:  persistant nausea and vomiting   Complete by: As directed    Call MD for:  severe uncontrolled pain   Complete by: As directed    Call MD for:  temperature >100.4   Complete by: As directed    Diet - low sodium heart healthy   Complete by: As directed    Discharge instructions   Complete by: As directed    1)Avoid ibuprofen/Advil/Aleve/Motrin/Goody Powders/Naproxen/BC powders/Meloxicam/Diclofenac/Indomethacin and other Nonsteroidal anti-inflammatory medications as these will make you more likely to bleed and can cause stomach ulcers, can also cause Kidney problems.   2) take medications as prescribed  3)follow up with Kathyrn Drown, MD in about 1 week for recheck  4)Physical Therapy will come to your House to Help you get stronger-   Increase activity slowly   Complete by: As directed          Discharge Medications     Allergies as of 11/22/2020       Reactions   Ibuprofen Hives   Phenobarbital Other (See Comments)   Causes blurred vision        Medication List     STOP taking these medications    colchicine 0.6 MG tablet       TAKE these medications    ALPRAZolam 1 MG tablet Commonly known as: XANAX TAKE ONE TABLET BY MOUTH AT BEDTIME AS NEEDED What changed:  how much to take how to take this when to take this reasons to take this additional instructions   Biotin 5000 MCG Tabs Take 5,000 mcg by mouth daily.   esomeprazole 40 MG capsule Commonly known as: NexIUM Take 1 capsule (40 mg total) by mouth daily with breakfast. What changed: when to take this   estradiol 2 MG tablet Commonly known as: ESTRACE Take 2 mg by mouth daily.   finasteride 5 MG tablet Commonly known as: PROSCAR Take 2.5 mg by mouth daily.   fluticasone 50 MCG/ACT nasal spray Commonly known as: FLONASE Place 1 spray into both nostrils 2 (two) times daily.   loperamide 2 MG tablet Commonly known as: Imodium A-D Take 1 tablet  (2 mg total) by mouth 4 (four) times daily as needed for diarrhea or loose stools.   Lubricant Eye Drops 0.4-0.3 % Soln Generic drug: Polyethyl Glycol-Propyl Glycol Place 1-2 drops into both eyes daily as needed (dry/irritated eyes.).   PHILLIPS COLON HEALTH PO Take 1 capsule by mouth daily as needed (irritable bowel).   potassium chloride 10 MEQ  tablet Commonly known as: KLOR-CON Take 2 tablet in the AM and 2 tablets in the PM   pravastatin 80 MG tablet Commonly known as: PRAVACHOL TAKE ONE (1) TABLET BY MOUTH EVERY DAY What changed:  how much to take how to take this when to take this additional instructions   predniSONE 20 MG tablet Commonly known as: DELTASONE Take 2 tablets (40 mg total) by mouth daily with breakfast for 5 days. Start taking on: November 23, 2020   sertraline 50 MG tablet Commonly known as: ZOLOFT Take one tablet daily What changed:  how much to take how to take this when to take this additional instructions   torsemide 20 MG tablet Commonly known as: DEMADEX Take 1 tablet (20 mg total) by mouth every morning. Start taking on: November 24, 2020 What changed: These instructions start on November 24, 2020. If you are unsure what to do until then, ask your doctor or other care provider.        Major procedures and Radiology Reports - PLEASE review detailed and final reports for all details, in brief -   DG Chest 1 View  Result Date: 11/19/2020 CLINICAL DATA:  Right scapula pain EXAM: CHEST  1 VIEW COMPARISON:  03/13/2020 FINDINGS: Surgical hardware in the cervical spine. No focal opacity or pleural effusion. Left shoulder replacement. Normal cardiomediastinal silhouette. No pneumothorax. IMPRESSION: No active disease. Electronically Signed   By: Donavan Foil M.D.   On: 11/19/2020 23:33   CT Renal Stone Study  Result Date: 11/19/2020 CLINICAL DATA:  Back pain EXAM: CT ABDOMEN AND PELVIS WITHOUT CONTRAST TECHNIQUE: Multidetector CT imaging of the abdomen  and pelvis was performed following the standard protocol without IV contrast. COMPARISON:  CT 02/15/2011 FINDINGS: Lower chest: Lung bases demonstrate no acute consolidation or effusion. Normal cardiac size. Hepatobiliary: No focal liver abnormality is seen. Status post cholecystectomy. No biliary dilatation. Pancreas: Unremarkable. No pancreatic ductal dilatation or surrounding inflammatory changes. Spleen: Normal in size without focal abnormality. Adrenals/Urinary Tract: Adrenal glands are normal. Prominent renal pelvises without hydronephrosis. Negative for urinary tract calculus. Bladder is normal Stomach/Bowel: Stomach is within normal limits. Appendix appears normal. No evidence of bowel wall thickening, distention, or inflammatory changes. Scattered colon diverticula. Vascular/Lymphatic: Mild aortic atherosclerosis without aneurysm. No suspicious nodes. Reproductive: Status post hysterectomy. No adnexal masses. Other: Negative for free air or free fluid Musculoskeletal: No acute or suspicious osseous abnormality. IMPRESSION: 1. No CT evidence for acute intraabdominal or intrapelvic abnormality 2. Scattered colon diverticula without acute inflammatory change Electronically Signed   By: Donavan Foil M.D.   On: 11/19/2020 19:58   DG HIP UNILAT WITH PELVIS 2-3 VIEWS RIGHT  Result Date: 11/19/2020 CLINICAL DATA:  Right hip pain EXAM: DG HIP (WITH OR WITHOUT PELVIS) 2-3V RIGHT COMPARISON:  01/14/2020 FINDINGS: Pubic symphysis and rami are intact. No fracture or malalignment. Mild arthritis of the right hip. IMPRESSION: No acute osseous abnormality.  Arthritis of the right hip Electronically Signed   By: Donavan Foil M.D.   On: 11/19/2020 23:33    Micro Results   Recent Results (from the past 240 hour(s))  Culture, blood (routine x 2)     Status: None (Preliminary result)   Collection Time: 11/19/20  5:35 PM   Specimen: BLOOD  Result Value Ref Range Status   Specimen Description BLOOD RIGHT  ANTECUBITAL  Final   Special Requests   Final    BOTTLES DRAWN AEROBIC AND ANAEROBIC Blood Culture adequate volume   Culture  Final    NO GROWTH 3 DAYS Performed at [facility], [address], [place], [place] [address]    Report Status PENDING  Incomplete  Culture, blood (routine x 2)     Status: None (Preliminary result)   Collection Time: 11/19/20  5:40 PM   Specimen: BLOOD LEFT HAND  Result Value Ref Range Status   Specimen Description BLOOD LEFT HAND  Final   Special Requests   Final    BOTTLES DRAWN AEROBIC AND ANAEROBIC Blood Culture adequate volume   Culture   Final    NO GROWTH 3 DAYS Performed at [facility], [address] [place], [place] [address]    Report Status PENDING  Incomplete  Urine culture     Status: Abnormal   Collection Time: 11/19/20 10:02 PM   Specimen: Urine, Clean Catch  Result Value Ref Range Status   Specimen Description   Final    URINE, CLEAN CATCH Performed at [facility], [address], [place], [place] [address]    Special Requests   Final    NONE Performed at [facility], [address], [place], [place] [address]    Culture (A)  Final    <10,000 COLONIES/mL INSIGNIFICANT GROWTH Performed at [facility], [place] [address], [place], [place] [address]    Report Status 11/21/2020 FINAL  Final  Resp Panel by RT-PCR (Flu A&B, Covid) Nasopharyngeal Swab     Status: None   Collection Time: 11/19/20 10:43 PM   Specimen: Nasopharyngeal Swab; Nasopharyngeal(NP) swabs in vial transport medium  Result Value Ref Range Status   SARS Coronavirus 2 by RT PCR NEGATIVE NEGATIVE Final    Comment: (NOTE) SARS-CoV-2 target nucleic acids are NOT DETECTED.  The SARS-CoV-2 RNA is generally detectable in upper respiratory specimens during the acute phase of infection. The lowest concentration of SARS-CoV-2 viral copies this assay can detect is 138 copies/mL. A negative result does not preclude SARS-Cov-2 infection and should not be used as the sole  basis for treatment or other patient management decisions. A negative result may occur with  improper specimen collection/handling, submission of specimen other than nasopharyngeal swab, presence of viral mutation(s) within the areas targeted by this assay, and inadequate number of viral copies(<138 copies/mL). A negative result must be combined with clinical observations, patient history, and epidemiological information. The expected result is Negative.  Fact Sheet for Patients:  EntrepreneurPulse.com.au  Fact Sheet for Healthcare Providers:  IncredibleEmployment.be  This test is no t yet approved or cleared by the [place] FDA and  has been authorized for detection and/or diagnosis of SARS-CoV-2 by FDA under an Emergency Use Authorization (EUA). This EUA will remain  in effect (meaning this test can be used) for the duration of the COVID-19 declaration under Section 564(b)(1) of the Act, 21 U.S.C.section 360bbb-3(b)(1), unless the authorization is terminated  or revoked sooner.       Influenza A by PCR NEGATIVE NEGATIVE Final   Influenza B by PCR NEGATIVE NEGATIVE Final    Comment: (NOTE) The Xpert Xpress SARS-CoV-2/FLU/RSV plus assay is intended as an aid in the diagnosis of influenza from Nasopharyngeal swab specimens and should not be used as a sole basis for treatment. Nasal washings and aspirates are unacceptable for Xpert Xpress SARS-CoV-2/FLU/RSV testing.  Fact Sheet for Patients: EntrepreneurPulse.com.au  Fact Sheet for Healthcare Providers: IncredibleEmployment.be  This test is not yet approved or cleared by the [place] FDA and has been authorized for detection and/or diagnosis of SARS-CoV-2 by FDA  under an Emergency Use Authorization (EUA). This EUA will remain in effect (meaning this test can be used) for the duration of the COVID-19 declaration under Section 564(b)(1) of the Act,  21 U.S.C. section 360bbb-3(b)(1), unless the authorization is terminated or revoked.  Performed at [facility], [address] [place], [place] [address]   C Difficile Quick Screen w PCR reflex     Status: None   Collection Time: 11/21/20  1:34 AM   Specimen: STOOL  Result Value Ref Range Status   C Diff antigen NEGATIVE NEGATIVE Final   C Diff toxin NEGATIVE NEGATIVE Final   C Diff interpretation No C. difficile detected.  Final    Comment: VALID Performed at [facility], [address] [place], [place] [address]   Gastrointestinal Panel by PCR , Stool     Status: Abnormal   Collection Time: 11/21/20  1:34 AM   Specimen: STOOL  Result Value Ref Range Status   Campylobacter species NOT DETECTED NOT DETECTED Final   Plesimonas shigelloides NOT DETECTED NOT DETECTED Final   Salmonella species NOT DETECTED NOT DETECTED Final   Yersinia enterocolitica DETECTED (A) NOT DETECTED Final    Comment: RESULT CALLED TO, READ BACK BY AND VERIFIED WITH: LAUREN COLEMAN,LPN @1441  ON 06.19.22.SH    Vibrio species NOT DETECTED NOT DETECTED Final   Vibrio cholerae NOT DETECTED NOT DETECTED Final   Enteroaggregative E coli (EAEC) NOT DETECTED NOT DETECTED Final   Enteropathogenic E coli (EPEC) NOT DETECTED NOT DETECTED Final   Enterotoxigenic E coli (ETEC) NOT DETECTED NOT DETECTED Final   Shiga like toxin producing E coli (STEC) NOT DETECTED NOT DETECTED Final   Shigella/Enteroinvasive E coli (EIEC) NOT DETECTED NOT DETECTED Final   Cryptosporidium NOT DETECTED NOT DETECTED Final   Cyclospora cayetanensis NOT DETECTED NOT DETECTED Final   Entamoeba histolytica NOT DETECTED NOT DETECTED Final   Giardia lamblia NOT DETECTED NOT DETECTED Final   Adenovirus F40/41 NOT DETECTED NOT DETECTED Final   Astrovirus NOT DETECTED NOT DETECTED Final   Norovirus GI/GII NOT DETECTED NOT DETECTED Final   Rotavirus A NOT DETECTED NOT DETECTED Final   Sapovirus (I, II, IV, and V) NOT DETECTED NOT DETECTED  Final    Comment: Performed at [facility], [address], [place], [place] [address]       Today   Subjective    Terri Douglas today has no new complaints -Ambulatory physical therapist, did well better, -Joint pain has improved significantly, diarrhea significantly improved No fever  Or chills  -No nausea or vomiting          Patient has been seen and examined prior to discharge   Objective   Blood pressure 131/72, pulse 72, temperature 97.6 F (36.4 C), temperature source Axillary, resp. rate 16, height 5\' 2"  (1.575 m), weight 62.1 kg, SpO2 98 %.  No intake or output data in the 24 hours ending 11/22/20 1500  Exam Gen:- Awake Alert,  in no apparent distress HEENT:- [place].AT, No sclera icterus Neck-Supple Neck,No JVD,. Lungs-  CTAB , fair symmetrical air movement CV- S1, S2 normal, regular Abd-  +ve B.Sounds, Abd Soft, No tenderness,    Extremity/Skin:- No  edema, pedal pulses present Psych-affect is appropriate, oriented x3 Neuro-much improved generalized weakness, no new focal deficits, +ve tremors MSK--- polyarthralgia, left big toe with much improved podagra type findings   Data Review   CBC w Diff:  Lab Results  Component Value Date   WBC 11.8 (H) 11/20/2020  HGB 10.9 (L) 11/20/2020   HGB 13.7 03/26/2018   HCT 34.5 (L) 11/20/2020   HCT 42.6 03/26/2018   PLT 366 11/20/2020   PLT 389 03/26/2018   LYMPHOPCT 7 11/19/2020   MONOPCT 9 11/19/2020   EOSPCT 0 11/19/2020   BASOPCT 0 11/19/2020    CMP:  Lab Results  Component Value Date   NA 136 11/22/2020   NA 143 05/21/2020   K 4.7 11/22/2020   CL 103 11/22/2020   CO2 26 11/22/2020   BUN 29 (H) 11/22/2020   BUN 10 05/21/2020   CREATININE 0.81 11/22/2020   CREATININE 0.85 06/18/2014   PROT 6.4 (L) 11/20/2020   PROT 6.0 05/21/2020   ALBUMIN 2.6 (L) 11/21/2020   ALBUMIN 4.0 05/21/2020   BILITOT 0.8 11/20/2020   BILITOT 0.4 05/21/2020   ALKPHOS 93 11/20/2020   AST 20 11/20/2020   ALT 17  11/20/2020  .   Total Discharge time is about 33 minutes  Roxan Hockey M.D on 11/22/2020 at 3:00 PM  Go to [email or web address] -  for contact info  [facility] - Office  [phone]

## 2020-11-22 NOTE — TOC Transition Note (Signed)
Transition of Care [facility]) - CM/SW Discharge Note   Patient Details  Name: Terri Douglas MRN: [id number] Date of Birth: 10-20-1945  [facility] of [facility]) CM/SW Contact:  Natasha Bence, LCSW Phone Number: 11/22/2020, 11:20 AM   Clinical Narrative:    CSW notified of PT's determination of HH. Patient agreeable to [facility] services. CSW referred patient to [place] with [facility]. Corene Cornea agreeable to provide [facility] services. TOC signing off.    Final next level of care: [place] Barriers to Discharge: Barriers Resolved   Patient Goals and CMS Choice Patient states their goals for this hospitalization and ongoing recovery are:: Return home with [facility] CMS Medicare.gov Compare Post Acute Care list provided to:: Patient Choice offered to / list presented to : Patient  Discharge Placement                    Patient and family notified of of transfer: 11/22/20  Discharge Plan and Services     Post Acute Care Choice: [place]                    HH Arranged: RN, PT [facility] Agency: [place] ([place]) Date [facility] Agency Contacted: 11/22/20 Time [place]: 1119 Representative spoke with at [place]: [place] ([place]) Interventions     Readmission Risk Interventions No flowsheet data found.

## 2020-11-23 DIAGNOSIS — R7303 Prediabetes: Secondary | ICD-10-CM | POA: Diagnosis not present

## 2020-11-23 DIAGNOSIS — M130 Polyarthritis, unspecified: Secondary | ICD-10-CM | POA: Diagnosis not present

## 2020-11-23 DIAGNOSIS — K219 Gastro-esophageal reflux disease without esophagitis: Secondary | ICD-10-CM | POA: Diagnosis not present

## 2020-11-23 DIAGNOSIS — R197 Diarrhea, unspecified: Secondary | ICD-10-CM | POA: Diagnosis not present

## 2020-11-23 DIAGNOSIS — R262 Difficulty in walking, not elsewhere classified: Secondary | ICD-10-CM | POA: Diagnosis not present

## 2020-11-23 DIAGNOSIS — G4700 Insomnia, unspecified: Secondary | ICD-10-CM | POA: Diagnosis not present

## 2020-11-23 DIAGNOSIS — M10072 Idiopathic gout, left ankle and foot: Secondary | ICD-10-CM | POA: Diagnosis not present

## 2020-11-23 DIAGNOSIS — M25511 Pain in right shoulder: Secondary | ICD-10-CM | POA: Diagnosis not present

## 2020-11-23 DIAGNOSIS — Z9181 History of falling: Secondary | ICD-10-CM | POA: Diagnosis not present

## 2020-11-23 DIAGNOSIS — M25551 Pain in right hip: Secondary | ICD-10-CM | POA: Diagnosis not present

## 2020-11-23 DIAGNOSIS — I4892 Unspecified atrial flutter: Secondary | ICD-10-CM | POA: Diagnosis not present

## 2020-11-23 DIAGNOSIS — E785 Hyperlipidemia, unspecified: Secondary | ICD-10-CM | POA: Diagnosis not present

## 2020-11-23 DIAGNOSIS — I10 Essential (primary) hypertension: Secondary | ICD-10-CM | POA: Diagnosis not present

## 2020-11-24 LAB — CULTURE, BLOOD (ROUTINE X 2)
Culture: NO GROWTH
Culture: NO GROWTH
Special Requests: ADEQUATE
Special Requests: ADEQUATE

## 2020-11-25 ENCOUNTER — Other Ambulatory Visit: Payer: Self-pay | Admitting: Family Medicine

## 2020-11-26 ENCOUNTER — Inpatient Hospital Stay: Payer: Medicare Other | Admitting: Family Medicine

## 2020-11-26 ENCOUNTER — Ambulatory Visit (INDEPENDENT_AMBULATORY_CARE_PROVIDER_SITE_OTHER): Payer: Medicare Other | Admitting: Family Medicine

## 2020-11-26 ENCOUNTER — Telehealth: Payer: Self-pay

## 2020-11-26 ENCOUNTER — Other Ambulatory Visit: Payer: Self-pay

## 2020-11-26 VITALS — BP 127/72 | HR 81 | Ht 62.0 in | Wt 137.0 lb

## 2020-11-26 DIAGNOSIS — M1610 Unilateral primary osteoarthritis, unspecified hip: Secondary | ICD-10-CM | POA: Diagnosis not present

## 2020-11-26 DIAGNOSIS — M1009 Idiopathic gout, multiple sites: Secondary | ICD-10-CM | POA: Diagnosis not present

## 2020-11-26 DIAGNOSIS — R197 Diarrhea, unspecified: Secondary | ICD-10-CM | POA: Diagnosis not present

## 2020-11-26 MED ORDER — ALPRAZOLAM 1 MG PO TABS: ORAL_TABLET | ORAL | 1 refills | Status: DC

## 2020-11-26 MED ORDER — PREDNISONE 20 MG PO TABS: ORAL_TABLET | ORAL | 0 refills | Status: DC

## 2020-11-26 MED ORDER — SERTRALINE HCL 50 MG PO TABS: ORAL_TABLET | ORAL | 1 refills | Status: DC

## 2020-11-26 NOTE — Telephone Encounter (Signed)
Patient is made aware of appt today

## 2020-11-26 NOTE — Telephone Encounter (Signed)
4:30 PM would be a work in thank you

## 2020-11-26 NOTE — Progress Notes (Signed)
   Subjective:    Patient ID: Terri Douglas, female    DOB: [birth year], 75 y.o.   MRN: [id number]  HPI  Patient arrives for a follow up on recent hospitalization for gout and UTI. Patient states she is not having any more urinary problems but her joint pain is much worse and now having swelling in her right hand with severe pain. Patient with significant pain and discomfort in her right hip as well as right hand plus also left MTP right MTP and in her left shoulder Relates pain discomfort stiffness.  Review of Systems     Objective:   Physical Exam  Lungs clear heart regular swollen right finger noted tenderness in the PIP joint MTP both feet tender worse on the left foot right hip pain with internal or external rotation      Assessment & Plan:  1. Hip arthritis May well need referral to orthopedics but first get gout under better control may end up needing surgery - Sedimentation rate - C-reactive protein - Basic metabolic panel - CBC with Differential/Platelet - Uric acid  2. Acute idiopathic gout of multiple sites Check uric acid level also check C-reactive protein.  Prednisone taper over the next 9 days.  Recheck 2 weeks.  Recheck sooner problems. - Sedimentation rate - C-reactive protein - Basic metabolic panel - CBC with Differential/Platelet - Uric acid  3. Diarrhea, unspecified type Intermittent diarrhea is improving follow-up if any ongoing troubles - Sedimentation rate - C-reactive protein - Basic metabolic panel - CBC with Differential/Platelet - Uric acid

## 2020-11-26 NOTE — Telephone Encounter (Signed)
Patient calls this morning w/ c/o pain in her in her hand, one her fingers appears to be swelling more. Also pain in R hip/butt, elbow, and knee. Taking prednisone, tylenol, and heating pad. Recently discharged from hospital. Wants to see if she can be worked into schedule

## 2020-11-26 NOTE — Patient Instructions (Signed)
Low-Purine Eating Plan A low-purine eating plan involves making food choices to limit your intake of purine. Purine is a kind of uric acid. Too much uric acid in your blood can cause certain conditions, such as gout and kidney stones. Eating a low-purinediet can help control these conditions. What are tips for following this plan? Reading food labels Avoid foods with saturated or Trans fat. Check the ingredient list of grains-based foods, such as bread and cereal, to make sure that they contain whole grains. Check the ingredient list of sauces or soups to make sure they do not contain meat or fish. When choosing soft drinks, check the ingredient list to make sure they do not contain high-fructose corn syrup. Shopping  Buy plenty of fresh fruits and vegetables. Avoid buying canned or fresh fish. Buy dairy products labeled as low-fat or nonfat. Avoid buying premade or processed foods. These foods are often high in fat, salt (sodium), and added sugar.  Cooking Use olive oil instead of butter when cooking. Oils like olive oil, canola oil, and sunflower oil contain healthy fats. Meal planning Learn which foods do or do not affect you. If you find out that a food tends to cause your gout symptoms to flare up, avoid eating that food. You can enjoy foods that do not cause problems. If you have any questions about a food item, talk with your dietitian or health care provider. Limit foods high in fat, especially saturated fat. Fat makes it harder for your body to get rid of uric acid. Choose foods that are lower in fat and are lean sources of protein. General guidelines Limit alcohol intake to no more than 1 drink a day for nonpregnant women and 2 drinks a day for men. One drink equals 12 oz of beer, 5 oz of wine, or 1 oz of hard liquor. Alcohol can affect the way your body gets rid of uric acid. Drink plenty of water to keep your urine clear or pale yellow. Fluids can help remove uric acid from your  body. If directed by your health care provider, take a vitamin C supplement. Work with your health care provider and dietitian to develop a plan to achieve or maintain a healthy weight. Losing weight can help reduce uric acid in your blood. What foods are recommended? The items listed may not be a complete list. Talk with your dietitian aboutwhat dietary choices are best for you. Foods low in purines Foods low in purines do not need to be limited. These include: All fruits. All low-purine vegetables, pickles, and olives. Breads, pasta, rice, cornbread, and popcorn. Cake and other baked goods. All dairy foods. Eggs, nuts, and nut butters. Spices and condiments, such as salt, herbs, and vinegar. Plant oils, butter, and margarine. Water, sugar-free soft drinks, tea, coffee, and cocoa. Vegetable-based soups, broths, sauces, and gravies. Foods moderate in purines Foods moderate in purines should be limited to the amounts listed.  cup of asparagus, cauliflower, spinach, mushrooms, or green peas, each day. 2/3 cup uncooked oatmeal, each day.  cup dry wheat bran or wheat germ, each day. 2-3 ounces of meat or poultry, each day. 4-6 ounces of shellfish, such as crab, lobster, oysters, or shrimp, each day. 1 cup cooked beans, peas, or lentils, each day. Soup, broths, or bouillon made from meat or fish. Limit these foods as much as possible. What foods are not recommended? The items listed may not be a complete list. Talk with your dietitian aboutwhat dietary choices are best for you.   Limit your intake of foods high in purines, including: Beer and other alcohol. Meat-based gravy or sauce. Canned or fresh fish, such as: Anchovies, sardines, herring, and tuna. Mussels and scallops. Codfish, trout, and haddock. Bacon. Organ meats, such as: Liver or kidney. Tripe. Sweetbreads (thymus gland or pancreas). Wild game or goose. Yeast or yeast extract supplements. Drinks sweetened with  high-fructose corn syrup. Summary Eating a low-purine diet can help control conditions caused by too much uric acid in the body, such as gout or kidney stones. Choose low-purine foods, limit alcohol, and limit foods high in fat. You will learn over time which foods do or do not affect you. If you find out that a food tends to cause your gout symptoms to flare up, avoid eating that food. This information is not intended to replace advice given to you by your health care provider. Make sure you discuss any questions you have with your healthcare provider. Document Revised: 09/05/2019 Document Reviewed: 09/05/2019 Elsevier Patient Education  2022 [employer] Inc.  

## 2020-11-27 LAB — BASIC METABOLIC PANEL
BUN/Creatinine Ratio: 24 (ref 12–28)
BUN: 23 mg/dL (ref 8–27)
CO2: 25 mmol/L (ref 20–29)
Calcium: 9.3 mg/dL (ref 8.7–10.3)
Chloride: 95 mmol/L — ABNORMAL LOW (ref 96–106)
Creatinine, Ser: 0.96 mg/dL (ref 0.57–1.00)
Glucose: 158 mg/dL — ABNORMAL HIGH (ref 65–99)
Potassium: 5.6 mmol/L — ABNORMAL HIGH (ref 3.5–5.2)
Sodium: 136 mmol/L (ref 134–144)
eGFR: 62 mL/min/{1.73_m2} (ref >59)

## 2020-11-27 LAB — CBC WITH DIFFERENTIAL/PLATELET
Basophils Absolute: 0 10*3/uL (ref 0.0–0.2)
Basos: 0 %
EOS (ABSOLUTE): 0 10*3/uL (ref 0.0–0.4)
Eos: 0 %
Hematocrit: 41.6 % (ref 34.0–46.6)
Hemoglobin: 13.2 g/dL (ref 11.1–15.9)
Immature Grans (Abs): 0.1 10*3/uL (ref 0.0–0.1)
Immature Granulocytes: 1 %
Lymphocytes Absolute: 0.4 10*3/uL — ABNORMAL LOW (ref 0.7–3.1)
Lymphs: 3 %
MCH: 27.7 pg (ref 26.6–33.0)
MCHC: 31.7 g/dL (ref 31.5–35.7)
MCV: 87 fL (ref 79–97)
Monocytes Absolute: 0.4 10*3/uL (ref 0.1–0.9)
Monocytes: 3 %
Neutrophils Absolute: 12 10*3/uL — ABNORMAL HIGH (ref 1.4–7.0)
Neutrophils: 93 %
Platelets: 695 10*3/uL — ABNORMAL HIGH (ref 150–450)
RBC: 4.77 x10E6/uL (ref 3.77–5.28)
RDW: 12.5 % (ref 11.7–15.4)
WBC: 12.8 10*3/uL — ABNORMAL HIGH (ref 3.4–10.8)

## 2020-11-27 LAB — SEDIMENTATION RATE: Sed Rate: 75 mm/hr — ABNORMAL HIGH (ref 0–40)

## 2020-11-27 LAB — URIC ACID: Uric Acid: 5.7 mg/dL (ref 3.1–7.9)

## 2020-11-27 LAB — C-REACTIVE PROTEIN: CRP: 54 mg/L — ABNORMAL HIGH (ref 0–10)

## 2020-11-30 ENCOUNTER — Inpatient Hospital Stay: Payer: Medicare Other | Admitting: Family Medicine

## 2020-11-30 ENCOUNTER — Other Ambulatory Visit: Payer: Self-pay | Admitting: Family Medicine

## 2020-11-30 DIAGNOSIS — M1009 Idiopathic gout, multiple sites: Secondary | ICD-10-CM

## 2020-11-30 DIAGNOSIS — I10 Essential (primary) hypertension: Secondary | ICD-10-CM | POA: Diagnosis not present

## 2020-11-30 DIAGNOSIS — R262 Difficulty in walking, not elsewhere classified: Secondary | ICD-10-CM | POA: Diagnosis not present

## 2020-11-30 DIAGNOSIS — M10072 Idiopathic gout, left ankle and foot: Secondary | ICD-10-CM | POA: Diagnosis not present

## 2020-11-30 DIAGNOSIS — M25511 Pain in right shoulder: Secondary | ICD-10-CM | POA: Diagnosis not present

## 2020-11-30 DIAGNOSIS — M25551 Pain in right hip: Secondary | ICD-10-CM | POA: Diagnosis not present

## 2020-11-30 DIAGNOSIS — M130 Polyarthritis, unspecified: Secondary | ICD-10-CM | POA: Diagnosis not present

## 2020-12-04 ENCOUNTER — Other Ambulatory Visit: Payer: Self-pay | Admitting: Family Medicine

## 2020-12-04 DIAGNOSIS — M25511 Pain in right shoulder: Secondary | ICD-10-CM | POA: Diagnosis not present

## 2020-12-04 DIAGNOSIS — M130 Polyarthritis, unspecified: Secondary | ICD-10-CM | POA: Diagnosis not present

## 2020-12-04 DIAGNOSIS — R262 Difficulty in walking, not elsewhere classified: Secondary | ICD-10-CM | POA: Diagnosis not present

## 2020-12-04 DIAGNOSIS — M1009 Idiopathic gout, multiple sites: Secondary | ICD-10-CM

## 2020-12-04 DIAGNOSIS — M25551 Pain in right hip: Secondary | ICD-10-CM | POA: Diagnosis not present

## 2020-12-04 DIAGNOSIS — I10 Essential (primary) hypertension: Secondary | ICD-10-CM | POA: Diagnosis not present

## 2020-12-04 DIAGNOSIS — M10072 Idiopathic gout, left ankle and foot: Secondary | ICD-10-CM | POA: Diagnosis not present

## 2020-12-08 DIAGNOSIS — R262 Difficulty in walking, not elsewhere classified: Secondary | ICD-10-CM | POA: Diagnosis not present

## 2020-12-08 DIAGNOSIS — M25511 Pain in right shoulder: Secondary | ICD-10-CM | POA: Diagnosis not present

## 2020-12-08 DIAGNOSIS — M25551 Pain in right hip: Secondary | ICD-10-CM | POA: Diagnosis not present

## 2020-12-08 DIAGNOSIS — M10072 Idiopathic gout, left ankle and foot: Secondary | ICD-10-CM | POA: Diagnosis not present

## 2020-12-08 DIAGNOSIS — I10 Essential (primary) hypertension: Secondary | ICD-10-CM | POA: Diagnosis not present

## 2020-12-08 DIAGNOSIS — M130 Polyarthritis, unspecified: Secondary | ICD-10-CM | POA: Diagnosis not present

## 2020-12-09 ENCOUNTER — Encounter: Payer: Self-pay | Admitting: Family Medicine

## 2020-12-09 ENCOUNTER — Other Ambulatory Visit: Payer: Self-pay

## 2020-12-09 ENCOUNTER — Ambulatory Visit (INDEPENDENT_AMBULATORY_CARE_PROVIDER_SITE_OTHER): Payer: Medicare Other | Admitting: Family Medicine

## 2020-12-09 VITALS — BP 128/60 | Temp 97.5°F | Wt 133.8 lb

## 2020-12-09 DIAGNOSIS — M1009 Idiopathic gout, multiple sites: Secondary | ICD-10-CM | POA: Diagnosis not present

## 2020-12-09 DIAGNOSIS — M1990 Unspecified osteoarthritis, unspecified site: Secondary | ICD-10-CM

## 2020-12-09 DIAGNOSIS — M1611 Unilateral primary osteoarthritis, right hip: Secondary | ICD-10-CM

## 2020-12-09 MED ORDER — METHYLPREDNISOLONE ACETATE 40 MG/ML IJ SUSP
40.0000 mg | Freq: Once | INTRAMUSCULAR | Status: AC
  Administered 2020-12-09: 40 mg via INTRAMUSCULAR

## 2020-12-09 NOTE — Progress Notes (Signed)
   Subjective:    Patient ID: Terri Douglas, female    DOB: [birth year], 75 y.o.   MRN: [id number]  HPI Pt here for follow up on hip pain. Pt had horrible day on Monday; hardly able to walk due to pain. Pt is stiff when waking up, after moving around for a few hours she is OK. Pt states gout has flared up. Pt is utilizing 2 tylenol, heating pad and hot baths.  Home health has been checking blood pressure and it has been running about 110/60.    Review of Systems     Objective:   Physical Exam General-in no acute distress Eyes-no discharge Lungs-respiratory rate normal, CTA CV-no murmurs,RRR Extremities skin warm dry no edema Neuro grossly normal Behavior normal, alert Increased pain in the right hip with internal and external rotation also gout in the left foot       Assessment & Plan:   Referral to rheumatology will try to reach out rheumatologist to see if there is other things can be done  Depo-Medrol 40 mg IM  Prednisone taper over the course of the next 8 days  Severe hip arthritis eventually may need replacement  Gout issues allopurinol 100 mg 1 every day we will recheck kidney function and uric acid level in several weeks  Follow-up in 1 month

## 2020-12-10 ENCOUNTER — Telehealth: Payer: Self-pay | Admitting: Family Medicine

## 2020-12-10 ENCOUNTER — Other Ambulatory Visit: Payer: Self-pay | Admitting: *Deleted

## 2020-12-10 DIAGNOSIS — M25511 Pain in right shoulder: Secondary | ICD-10-CM | POA: Diagnosis not present

## 2020-12-10 DIAGNOSIS — M130 Polyarthritis, unspecified: Secondary | ICD-10-CM | POA: Diagnosis not present

## 2020-12-10 DIAGNOSIS — M25551 Pain in right hip: Secondary | ICD-10-CM | POA: Diagnosis not present

## 2020-12-10 DIAGNOSIS — R262 Difficulty in walking, not elsewhere classified: Secondary | ICD-10-CM | POA: Diagnosis not present

## 2020-12-10 DIAGNOSIS — M10072 Idiopathic gout, left ankle and foot: Secondary | ICD-10-CM | POA: Diagnosis not present

## 2020-12-10 DIAGNOSIS — I10 Essential (primary) hypertension: Secondary | ICD-10-CM | POA: Diagnosis not present

## 2020-12-10 MED ORDER — PREDNISONE 20 MG PO TABS: ORAL_TABLET | ORAL | 0 refills | Status: DC

## 2020-12-10 MED ORDER — ALLOPURINOL 100 MG PO TABS: 100.0000 mg | ORAL_TABLET | Freq: Every day | ORAL | 5 refills | Status: DC

## 2020-12-10 NOTE — Progress Notes (Signed)
Message sent to referral coordinator asking which office referral was sent to.

## 2020-12-10 NOTE — Telephone Encounter (Signed)
Please initiate allopurinol 100 mg, #30, 1 daily, 5 refills Also prednisone 20 mg 2 daily for the next 7 days  Also she was referred to rheumatology please find out which rheumatology group this went to so I can call there to see if they could potentially see her sooner or at least get her appointment scheduled

## 2020-12-10 NOTE — Progress Notes (Signed)
Terri Douglas said referral was sent to Dr. Christene Slates office and number is [phone]. I called the office to schedule her an appt and was told referral just sent in on 7/1 and it is still under review by dr and appt cannot be made til dr reviews. Do you want to speak with dr Lenor Coffin?

## 2020-12-10 NOTE — Telephone Encounter (Signed)
Meds sent to pharm and pt was notified. I also sent you a cc chart message earlier today about the referral and I let pt know while I had her on the phone. I called dr devashwar's office where she was referred and was told I was unable to make appt for her until the dr reviewed her notes. They did receive notes and referral on 7/1.

## 2020-12-10 NOTE — Telephone Encounter (Signed)
Patient was seen yesterday and was told prescription for allopurinol 100 mg and prednisone would be sent to [facility]

## 2020-12-13 NOTE — Progress Notes (Signed)
Please inform the patient that her information was sent to the rheumatologist and they are reviewing this then they should set up a follow-up appointment with her.  Rheumatology appointment is important to handle her inflammatory arthritis.  But it would also be fine for her to do a follow-up visit with her orthopedist because the amount of arthritis in her right hip may need possible injections or other measures that a orthopedist would do

## 2020-12-13 NOTE — Telephone Encounter (Signed)
Please make sure patient is aware of rheumatology reviewing the notes first before doing consult scheduling

## 2020-12-14 NOTE — Telephone Encounter (Signed)
I did tell pt the day I called her that specialist had to review notes before appt could be made and that the office had received the referral.

## 2020-12-15 DIAGNOSIS — M10072 Idiopathic gout, left ankle and foot: Secondary | ICD-10-CM | POA: Diagnosis not present

## 2020-12-15 DIAGNOSIS — R262 Difficulty in walking, not elsewhere classified: Secondary | ICD-10-CM | POA: Diagnosis not present

## 2020-12-15 DIAGNOSIS — M130 Polyarthritis, unspecified: Secondary | ICD-10-CM | POA: Diagnosis not present

## 2020-12-15 DIAGNOSIS — M25511 Pain in right shoulder: Secondary | ICD-10-CM | POA: Diagnosis not present

## 2020-12-15 DIAGNOSIS — I10 Essential (primary) hypertension: Secondary | ICD-10-CM | POA: Diagnosis not present

## 2020-12-15 DIAGNOSIS — M25551 Pain in right hip: Secondary | ICD-10-CM | POA: Diagnosis not present

## 2020-12-16 NOTE — Progress Notes (Signed)
12/16/20- pt contacted and verbalized understanding. Pt states she is feeling better; took last round of prednisone this morning. Pt very appreciate of all provider does for her.

## 2020-12-17 DIAGNOSIS — M25511 Pain in right shoulder: Secondary | ICD-10-CM | POA: Diagnosis not present

## 2020-12-17 DIAGNOSIS — I10 Essential (primary) hypertension: Secondary | ICD-10-CM | POA: Diagnosis not present

## 2020-12-17 DIAGNOSIS — M10072 Idiopathic gout, left ankle and foot: Secondary | ICD-10-CM | POA: Diagnosis not present

## 2020-12-17 DIAGNOSIS — R262 Difficulty in walking, not elsewhere classified: Secondary | ICD-10-CM | POA: Diagnosis not present

## 2020-12-17 DIAGNOSIS — M130 Polyarthritis, unspecified: Secondary | ICD-10-CM | POA: Diagnosis not present

## 2020-12-17 DIAGNOSIS — M25551 Pain in right hip: Secondary | ICD-10-CM | POA: Diagnosis not present

## 2020-12-27 NOTE — Progress Notes (Signed)
Office Visit Note  Patient: Terri Douglas             Date of Birth: May 19, 1946           MRN: [id number]             PCP: Kathyrn Drown, MD Referring: Kathyrn Drown, MD Visit Date: 12/28/2020  Subjective:  New Patient (Initial Visit) (Patient complains of right shoulder, elbow, and hand pain; right knee pain; left foot pain. Patient takes Allopurinol 100 mg daily. Patient complains of frequent gout flares. )   History of Present Illness: Terri Douglas is a 75 y.o. female here for joint pain of multiple sites ongoing since a hospitalization last month.  Currently prescribed allopurinol 100 mg daily. She has a more chronic history of osteoarthritis in multiple areas with chronic hip pain.  She has a history of gout with previous attacks in the left and right great toe on treatment with allopurinol 100 mg daily.  She denies any history of previous gout attack outside of her feet. The symptoms have been and there typical state for some time until she became ill with flank pain and diarrhea symptoms seen at the hospital with work-up indicating uncomplicated yersinia enterocolitis enteritis.  She was treated with colchicine for suspected gout flare but stopped due to the diarrhea and treated with prednisone with improvement of symptoms.  She was most recently prescribed oral steroids about 3 weeks ago and primary care follow-up this improves her symptoms though she notices return after stopping it. Currently she has inflammation of multiple sites particularly in the right thumb and index finger and the right knee and in the left foot.  The right knee is her worst problem she reports swelling that started in the front and has now also involve the back of the knee limiting her ability to flex this and cannot assume a normal seated position.  Labs reviewed 11/2020 ESR 75 CRP 54 CBC WBC 12.8 Plts 695 CMP K 5.6 Uric acid 5.7  Imaging reviewed 11/19/20 Xray right hip FINDINGS: Pubic symphysis  and rami are intact. No fracture or malalignment. Mild arthritis of the right hip. IMPRESSION: No acute osseous abnormality.  Arthritis of the right hip  11/19/20 Xray chest FINDINGS: Surgical hardware in the cervical spine. No focal opacity or pleural effusion. Left shoulder replacement. Normal cardiomediastinal silhouette. No pneumothorax. IMPRESSION: No active disease.  11/19/20 CT renal stone IMPRESSION: 1. No CT evidence for acute intraabdominal or intrapelvic abnormality 2. Scattered colon diverticula without acute inflammatory change  06/26/20 CT left shoulder IMPRESSION: 1. Left shoulder reverse hemiarthroplasty without hardware failure or complication. 2. Nondisplaced healing fractures of the coracoid process.  12/04/19 DXA Results reviewed are consistent with osteopenia lowest t-score -1.4 at left femoral neck  Activities of Daily Living:  Patient reports morning stiffness for 3 hours.   Patient Denies nocturnal pain.  Difficulty dressing/grooming: Reports Difficulty climbing stairs: Reports Difficulty getting out of chair: Reports Difficulty using hands for taps, buttons, cutlery, and/or writing: Reports  Review of Systems  Constitutional:  Negative for fatigue.  HENT:  Positive for mouth dryness. Negative for mouth sores and nose dryness.   Eyes:  Positive for visual disturbance. Negative for pain, itching and dryness.  Respiratory:  Negative for cough, hemoptysis, shortness of breath and difficulty breathing.   Cardiovascular:  Positive for swelling in legs/feet. Negative for chest pain and palpitations.  Gastrointestinal:  Negative for abdominal pain, blood in stool, constipation and diarrhea.  Endocrine: Negative for increased urination.  Genitourinary:  Negative for painful urination.  Musculoskeletal:  Positive for joint pain, joint pain, joint swelling and morning stiffness. Negative for myalgias, muscle weakness, muscle tenderness and myalgias.  Skin:  Positive  for color change. Negative for rash and redness.  Allergic/Immunologic: Negative for susceptible to infections.  Neurological:  Negative for dizziness, numbness, headaches, memory loss and weakness.  Hematological:  Positive for bruising/bleeding tendency. Negative for swollen glands.  Psychiatric/Behavioral:  Positive for sleep disturbance. Negative for confusion.    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   Arthritis 11/20/2020   Diarrhea 11/20/2020   Hyponatremia 11/20/2020   Hypokalemia 11/20/2020   Generalized weakness 11/20/2020   Leukocytosis 11/20/2020   Ambulatory dysfunction 11/20/2020   Hypomagnesemia 11/20/2020   Dehydration 11/19/2020   Laryngopharyngeal reflux (LPR) 09/28/2020   Primary osteoarthritis of right hip 01/17/2020   Hip pain, bilateral 01/14/2020   Decreased hearing 01/14/2020   Pedal edema 11/19/2019   Hoarseness    Constipation    Other dysphagia    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   Insomnia 03/15/2019   Abdominal pain, epigastric 05/09/2016   Belching 05/09/2016   Hyperlipidemia 06/18/2014   Hyperglycemia 06/18/2014   Gout 06/18/2014   Osteopenia 10/28/2013   Right foot pain 01/31/2013   Hypertension 03/29/2011   High cholesterol 03/29/2011    Past Medical History:  Diagnosis Date   Arthritis    Complication of anesthesia    Gall stones    GERD (gastroesophageal reflux disease)    History of cardiac catheterization 2005   Minor elevation in cardiac enzymes however no significant obstructive CAD   Hyperlipidemia    Hypertension    Insomnia    PONV (postoperative nausea and vomiting)    Prediabetes     Family History  Problem Relation Age of Onset   Bone cancer Mother    Cancer Mother    Prostate cancer Father    Cancer Father    Heart disease Father    Prostate cancer Brother    Heart  disease Brother    Early death Son    Healthy Son    Healthy Daughter    Anesthesia problems Neg Hx    Hypotension Neg Hx    Malignant hyperthermia Neg Hx    Pseudochol deficiency Neg Hx    Past Surgical History:  Procedure Laterality Date   A-FLUTTER ABLATION N/A 08/01/2017   Procedure: A-FLUTTER ABLATION;  Surgeon: Thompson Grayer, MD;  Location: [facility];  Service: Cardiovascular;  Laterality: N/A;   ABDOMINAL HYSTERECTOMY     BACK SURGERY  1992   CARDIAC CATHETERIZATION     BACK IN 2004  SHE THINKS IT CAME BACK 'NORMAL'   CHOLECYSTECTOMY N/A 06/24/2015   Procedure: LAPAROSCOPIC CHOLECYSTECTOMY;  Surgeon: Mickeal Skinner, MD;  Location: [facility];  Service: General;  Laterality: N/A;   COLONOSCOPY N/A 08/30/2012   Procedure: COLONOSCOPY;  Surgeon: Rogene Houston, MD;  Location: [facility];  Service: Endoscopy;  Laterality: N/A;  [place]     ESOPHAGOGASTRODUODENOSCOPY  04/15/2011   Procedure: ESOPHAGOGASTRODUODENOSCOPY (EGD);  Surgeon: Rogene Houston, MD;  Location: [facility];  Service: Endoscopy;  Laterality: N/A;  11:30   ESOPHAGOGASTRODUODENOSCOPY N/A 05/27/2016   Procedure: ESOPHAGOGASTRODUODENOSCOPY (EGD);  Surgeon: Rogene Houston, MD;  Location: [facility];  Service: Endoscopy;  Laterality: N/A;  11:15   EYE SURGERY     cataract removal   NECK SURGERY  12/13/2018   REVERSE SHOULDER ARTHROPLASTY Left 04/03/2019   Procedure: REVERSE SHOULDER ARTHROPLASTY;  Surgeon: Hiram Gash, MD;  Location: [facility];  Service: Orthopedics;  Laterality: Left;   VAGINAL HYSTERECTOMY     Social History   Social History Narrative   Not on file   Immunization History  Administered Date(s) Administered   Fluad Quad(high Dose 65+) 03/16/2020   Influenza,inj,Quad PF,6+ Mos 03/20/2015, 04/01/2016   Influenza-Unspecified Sep 06, 1945, 03/06/2012, 03/06/2014, 03/06/2018, 04/06/2018, 03/16/2019   Moderna Sars-Covid-2 Vaccination 07/18/2019,  08/16/2019, 03/20/2020   Pneumococcal Conjugate-13 06/18/2014   Pneumococcal Polysaccharide-23 03/15/2019   Td 11/10/2006   Zoster, Live 03/05/2014     Objective: Vital Signs: BP 132/82 (BP Location: Right Arm, Patient Position: Sitting, Cuff Size: Normal)   Pulse 70   Ht 5' 1.5" (1.562 m)   Wt 131 lb (59.4 kg)   BMI 24.35 kg/m    Physical Exam HENT:     Right Ear: External ear normal.     Left Ear: External ear normal.     Mouth/Throat:     Mouth: Mucous membranes are moist.     Pharynx: Oropharynx is clear.  Eyes:     Conjunctiva/sclera: Conjunctivae normal.  Cardiovascular:     Rate and Rhythm: Normal rate and regular rhythm.  Pulmonary:     Effort: Pulmonary effort is normal.     Breath sounds: Normal breath sounds.  Skin:    General: Skin is warm and dry.     Findings: No rash.  Neurological:     General: No focal deficit present.     Mental Status: She is alert.  Psychiatric:        Mood and Affect: Mood normal.     Musculoskeletal Exam:  Neck decreased leftward lateral rotation Shoulders full ROM no tenderness or swelling Elbows full ROM no tenderness or swelling Wrists full ROM no tenderness or swelling Fingers swelling and tenderness present over right first and second MCP joints with swelling and tenderness extending over flexor tendons not limited to the joint Knees right knee effusion and palpable swelling and posterior knee probable cyst, tenderness to right knee joint line palpation flexion range of motion is significantly reduced Mild left ankle tenderness anteriorly with symmetric range of motion no right ankle pain Right first MTP with bunion no swelling or erythema, left first MTP shows erythema and postinflammatory desquamation   CDAI Exam: CDAI Score: 17  Patient Global: 60 mm; Provider Global: 50 mm Swollen: 3 ; Tender: 4  Joint Exam 12/28/2020      Right  Left  MCP 1  Swollen Tender     MCP 2  Swollen Tender     Knee  Swollen Tender      Subtalar      Tender     Investigation: No additional findings.  Imaging: No results found.  Recent Labs: Lab Results  Component Value Date   WBC 12.8 (H) 11/26/2020   HGB 13.2 11/26/2020   PLT 695 (H) 11/26/2020   NA 136 11/26/2020   K 5.6 (H) 11/26/2020   CL 95 (L) 11/26/2020   CO2 25 11/26/2020   GLUCOSE 158 (H) 11/26/2020   BUN 23 11/26/2020   CREATININE 0.96 11/26/2020   BILITOT 0.8 11/20/2020   ALKPHOS 93 11/20/2020   AST 20 11/20/2020   ALT 17 11/20/2020   PROT 6.4 (L) 11/20/2020  ALBUMIN 2.6 (L) 11/21/2020   CALCIUM 9.3 11/26/2020   GFRAA 65 05/21/2020    Speciality Comments: No specialty comments available.  Procedures:  Large Joint Inj: R knee on 12/28/2020 11:20 AM Indications: pain, joint swelling and diagnostic evaluation Details: (21 g) 1.5 in needle, lateral approach Medications: 4 mL lidocaine 1 %; 40 mg triamcinolone acetonide 40 MG/ML Aspirate: 17 mL clear and yellow; sent for lab analysis Outcome: tolerated well, no immediate complications Procedure, treatment alternatives, risks and benefits explained, specific risks discussed. Consent was given by the patient. Immediately prior to procedure a time out was called to verify the correct patient, procedure, equipment, support staff and site/side marked as required. Patient was prepped and draped in the usual sterile fashion.    Allergies: Ibuprofen and Phenobarbital   Assessment / Plan:     Visit Diagnoses: Inflammatory arthritis - Plan: Cyclic citrul peptide antibody, IgG, Rheumatoid factor, Sedimentation rate, Uric acid, Synovial Fluid Analysis, Complete, Anaerobic and Aerobic Culture  Inflammatory arthritis of multiple sites symptoms do not appear typical for gout besides the left toe that shows typical postinflammatory desquamation.  Inflammation of other areas I am suspicious for reactive arthritis with history of Yersinia enterocolitis.  We will check serology for RF and CCP antibodies.   Right knee aspiration performed with synovial fluid collected for analysis and local steroid injection.  Fluid appeared clear and yellow.  She does have history of ibuprofen allergy with hives.  Idiopathic gout, unspecified chronicity, unspecified site  Looks like gout attack in the left great toe probably provoked by her dehydration and stress and recent hospitalization.  Looks good today.  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  Primary osteoarthritis of right hip  Hip pain could be increased axial joint involvement fairly common and reactive disease.  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  Orders: Orders Placed This Encounter  Procedures   Large Joint Inj   Anaerobic and Aerobic Culture   Cyclic citrul peptide antibody, IgG   Rheumatoid factor   Sedimentation rate   Uric acid   Synovial Fluid Analysis, Complete    No orders of the defined types were placed in this encounter.    Follow-Up Instructions: Return in about 2 weeks (around 01/11/2021) for New pt f/u ?ReA vs Gout or RA 2wks.   Collier Salina, MD  Note - This record has been created using [employer].  Chart creation errors have been sought, but may not always  have been located. Such creation errors do not reflect on  the standard of medical care.

## 2020-12-28 ENCOUNTER — Other Ambulatory Visit: Payer: Self-pay

## 2020-12-28 ENCOUNTER — Encounter: Payer: Self-pay | Admitting: Internal Medicine

## 2020-12-28 ENCOUNTER — Ambulatory Visit (INDEPENDENT_AMBULATORY_CARE_PROVIDER_SITE_OTHER): Payer: Medicare Other | Admitting: Internal Medicine

## 2020-12-28 VITALS — BP 132/82 | HR 70 | Ht 61.5 in | Wt 131.0 lb

## 2020-12-28 DIAGNOSIS — M1711 Unilateral primary osteoarthritis, right knee: Secondary | ICD-10-CM | POA: Diagnosis not present

## 2020-12-28 DIAGNOSIS — M1990 Unspecified osteoarthritis, unspecified site: Secondary | ICD-10-CM

## 2020-12-28 DIAGNOSIS — M1000 Idiopathic gout, unspecified site: Secondary | ICD-10-CM | POA: Diagnosis not present

## 2020-12-28 DIAGNOSIS — A046 Enteritis due to Yersinia enterocolitica: Secondary | ICD-10-CM | POA: Diagnosis not present

## 2020-12-28 DIAGNOSIS — M1611 Unilateral primary osteoarthritis, right hip: Secondary | ICD-10-CM | POA: Diagnosis not present

## 2020-12-28 DIAGNOSIS — M0230 Reiter's disease, unspecified site: Secondary | ICD-10-CM | POA: Insufficient documentation

## 2020-12-28 MED ORDER — LIDOCAINE HCL 1 % IJ SOLN
4.0000 mL | INTRAMUSCULAR | Status: AC | PRN
  Administered 2020-12-28: 4 mL

## 2020-12-28 MED ORDER — TRIAMCINOLONE ACETONIDE 40 MG/ML IJ SUSP
40.0000 mg | INTRAMUSCULAR | Status: AC | PRN
  Administered 2020-12-28: 40 mg via INTRA_ARTICULAR

## 2020-12-28 NOTE — Patient Instructions (Signed)
Your current joint inflammation does not look typical for gout. The left great toe does look like gout but not the rest. I am suspicious if this may be a reactive inflammation from previous infection or this may be a new start of a chronic condition like rheumatoid arthritis.  We are checking the knee fluid for evidence of gout or infection. I am checking blood tests for evidence of rheumatoid arthritis  I injected a steroid medicine into your knee that should hopefully help symptoms in the short term, let us know if this makes it worse instead of better after a few days.  Joint Steroid Injection A joint steroid injection is a procedure to relieve swelling and pain in a joint. Steroids are medicines that reduce inflammation. In this procedure, your health care provider uses a syringe and a needle to inject a steroid medicine into a painful and inflamed joint. A pain-relieving medicine (anesthetic) may be injected along with the steroid. In some cases, your health care provider may use an imaging technique such as ultrasound or fluoroscopy toguide the injection. Joints that are often treated with steroid injections include the knee, shoulder, hip, and spine. These injections may also be used in the elbow, ankle, and joints of the hands or feet. You may have joint steroid injections as part of your treatment for inflammation caused by: Gout. Rheumatoid arthritis. Advanced wear-and-tear arthritis (osteoarthritis). Tendinitis. Bursitis. Joint steroid injections may be repeated, but having them too often can damage a joint or the skin over the joint. You should not have joint steroidinjections less than 6 weeks apart or more than four times a year. Tell a health care provider about: Any allergies you have. All medicines you are taking, including vitamins, herbs, eye drops, creams, and over-the-counter medicines. Any problems you or family members have had with anesthetic medicines. Any blood disorders  you have. Any surgeries you have had. Any medical conditions you have. Whether you are pregnant or may be pregnant. What are the risks? Generally, this is a safe treatment. However, problems may occur, including: Infection. Bleeding. Allergic reactions to medicines. Damage to the joint or tissues around the joint. Thinning of skin or loss of skin color over the joint. Temporary flushing of the face or chest. Temporary increase in pain. Temporary increase in blood sugar. Failure to relieve inflammation or pain. What happens before the treatment? Medicines Ask your health care provider about: Changing or stopping your regular medicines. This is especially important if you are taking diabetes medicines or blood thinners. Taking medicines such as aspirin and ibuprofen. These medicines can thin your blood. Do not take these medicines unless your health care provider tells you to take them. Taking over-the-counter medicines, vitamins, herbs, and supplements. General instructions You may have imaging tests of your joint. Ask your health care provider if you can drive yourself home after the procedure. What happens during the treatment?  Your health care provider will position you for the injection and locate the injection site over your joint. The skin over the joint will be cleaned with a germ-killing soap. Your health care provider may: Spray a numbing solution (topical anesthetic) over the injection site. Inject a local anesthetic under the skin above your joint. The needle will be placed through your skin into your joint. Your health care provider may use imaging to guide the needle to the right spot for the injection. If imaging is used, a special contrast dye may be injected to confirm that the needle is in  the correct location. The steroid medicine will be injected into your joint. Anesthetic may be injected along with the steroid. This may be a medicine that relieves pain for a  short time (short-acting anesthetic) or for a longer time (long-acting anesthetic). The needle will be removed, and an adhesive bandage (dressing) will be placed over the injection site. The procedure may vary among health care providers and hospitals. What can I expect after the treatment? You will be able to go home after the treatment. It is normal to feel slight flushing for a few days after the injection. After the treatment, it is common to have an increase in joint pain after the anesthetic has worn off. This may happen about an hour after a short-acting anesthetic or about 8 hours after a longer-acting anesthetic. You should begin to feel relief from joint pain and swelling after 24 to 48 hours. Contact your health care provider if you do not begin to feel relief after 2 days. Follow these instructions at home: Injection site care Leave the adhesive dressing over your injection site in place until your health care provider says you can remove it. Check your injection site every day for signs of infection. Check for: More redness, swelling, or pain. Fluid or blood. Warmth. Pus or a bad smell. Activity Return to your normal activities as told by your health care provider. Ask your health care provider what activities are safe for you. You may be asked to limit activities that put stress on the joint for a few days. Do joint exercises as told by your health care provider. Do not take baths, swim, or use a hot tub until your health care provider approves. Ask your health care provider if you may take showers. You may only be allowed to take sponge baths. Managing pain, stiffness, and swelling  If directed, put ice on the joint. To do this: Put ice in a plastic bag. Place a towel between your skin and the bag. Leave the ice on for 20 minutes, 2-3 times a day. Remove the ice if your skin turns bright red. This is very important. If you cannot feel pain, heat, or cold, you have a greater  risk of damage to the area. Raise (elevate) your joint above the level of your heart when you are sitting or lying down.  General instructions Take over-the-counter and prescription medicines only as told by your health care provider. Do not use any products that contain nicotine or tobacco, such as cigarettes, e-cigarettes, and chewing tobacco. These can delay joint healing. If you need help quitting, ask your health care provider. If you have diabetes, be aware that your blood sugar may be slightly elevated for several days after the injection. Keep all follow-up visits. This is important. Contact a health care provider if you have: Chills or a fever. Any signs of infection at your injection site. Increased pain or swelling or no relief after 2 days. Summary A joint steroid injection is a treatment to relieve pain and swelling in a joint. Steroids are medicines that reduce inflammation. Your health care provider may add an anesthetic along with the steroid. You may have joint steroid injections as part of your arthritis treatment. Joint steroid injections may be repeated, but having them too often can damage a joint or the skin over the joint. Contact your health care provider if you have a fever, chills, or signs of infection, or if you get no relief from joint pain or swelling. This information  is not intended to replace advice given to you by your health care provider. Make sure you discuss any questions you have with your healthcare provider. Document Revised: 11/01/2019 Document Reviewed: 11/01/2019 Elsevier Patient Education  2022 [employer].

## 2020-12-29 LAB — URIC ACID: Uric Acid, Serum: 5.6 mg/dL (ref 2.5–7.0)

## 2020-12-29 LAB — CYCLIC CITRUL PEPTIDE ANTIBODY, IGG: Cyclic Citrullin Peptide Ab: <16 UNITS

## 2020-12-29 LAB — RHEUMATOID FACTOR: Rheumatoid fact SerPl-aCnc: <14 IU/mL (ref <14)

## 2020-12-29 LAB — SEDIMENTATION RATE: Sed Rate: 51 mm/h — ABNORMAL HIGH (ref 0–30)

## 2020-12-31 ENCOUNTER — Ambulatory Visit (INDEPENDENT_AMBULATORY_CARE_PROVIDER_SITE_OTHER): Payer: Medicare Other | Admitting: Internal Medicine

## 2020-12-31 ENCOUNTER — Encounter: Payer: Self-pay | Admitting: Internal Medicine

## 2020-12-31 ENCOUNTER — Other Ambulatory Visit: Payer: Self-pay

## 2020-12-31 VITALS — BP 126/76 | HR 73 | Ht 61.5 in | Wt 134.6 lb

## 2020-12-31 DIAGNOSIS — Z79899 Other long term (current) drug therapy: Secondary | ICD-10-CM | POA: Diagnosis not present

## 2020-12-31 DIAGNOSIS — M0239 Reiter's disease, multiple sites: Secondary | ICD-10-CM | POA: Diagnosis not present

## 2020-12-31 MED ORDER — SULFASALAZINE 500 MG PO TABS: 500.0000 mg | ORAL_TABLET | Freq: Three times a day (TID) | ORAL | 0 refills | Status: DC

## 2020-12-31 MED ORDER — CELECOXIB 100 MG PO CAPS: 100.0000 mg | ORAL_CAPSULE | Freq: Two times a day (BID) | ORAL | 0 refills | Status: DC

## 2020-12-31 NOTE — Patient Instructions (Signed)
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 This problem can sometimes go away on its own after a few months but can sometimes last a long time.  I recommend trying to take celebrex '100mg'$  twice daily. This medicine works similarly to ibuprofen but with a more selective mechanism so usually does not share this allergy. Watch out if you do notice any development of hives or rashes after starting this stop the medicine and can take benadryl for the rash.  I recommend starting sulfasalazine '500mg'$  3 times daily. Start taking this medicine just once per day then if it goes okay you can increase to the 3 times daily after a week.  Celecoxib Capsules What is this medication? CELECOXIB (sell a KOX ib) treats mild to moderate pain, inflammation, orarthritis. It belongs to a group of medications called NSAIDs. This medicine may be used for other purposes; ask your health care provider orpharmacist if you have questions. COMMON BRAND NAME(S): Celebrex What should I tell my care team before I take this medication? They need to know if you have any of these conditions: Bleeding disorders Coronary artery bypass graft (CABG) within the past 2 weeks Heart attack Heart disease Heart failure High blood pressure High levels of potassium in the blood If you often drink alcohol Kidney disease Liver disease Low red blood cell counts Lung or breathing disease (asthma) Receiving steroids like dexamethasone or prednisone Smoke cigarettes Stomach bleeding Stomach or intestine problems Take medications that treat or prevent blood clots An unusual or allergic reaction to celecoxib, other medications, foods, dyes, or preservatives Pregnant or trying to get pregnant Breast-feeding How should I use this medication? Take this medication by mouth with water. Take it as directed on the prescription label at the same time every day. Do not cut, crush or chew  this medication. Swallow the capsules whole. You may open the capsule and put the contents in 1 teaspoon of applesauce. Swallow the medication and applesauce right away. Do not chew the medication or applesauce. Keep taking it unlessyour care team tells you to stop. A special MedGuide will be given to you by the pharmacist with eachprescription and refill. Be sure to read this information carefully each time. Talk to your care team regarding the use of this medication in children. While this medication may be prescribed for children as young as 55 years old forselected conditions, precautions do apply. Patients over 58 years of age may have a stronger reaction and need a smallerdose. Overdosage: If you think you have taken too much of this medicine contact apoison control center or emergency room at once. NOTE: This medicine is only for you. Do not share this medicine with others. What if I miss a dose? If you miss a dose, take it as soon as you can. If it is almost time for yournext dose, take only that dose. Do not take double or extra doses. What may interact with this medication? Do not take this medication with any of the following: Cidofovir Ketorolac Thioridazine This medication may also interact with the following: Alcohol Aspirin and aspirin-like medications Atomoxetine Certain medications for blood pressure, heart disease, irregular heart beat Certain medications for depression, anxiety, or psychotic disorders Certain medications that treat or prevent blood clots like warfarin, enoxaparin, dalteparin, apixaban, dabigatran, and rivaroxaban Cyclosporine Digoxin Diuretics Fluconazole Lithium Methotrexate Other NSAIDs, medications for pain and inflammation, like ibuprofen or naproxen Pemetrexed Rifampin Steroid medications like prednisone or cortisone This list may not describe  all possible interactions. Give your health care provider a list of all the medicines, herbs,  non-prescription drugs, or dietary supplements you use. Also tell them if you smoke, drink alcohol, or use illegaldrugs. Some items may interact with your medicine. What should I watch for while using this medication? Visit your care team for regular checks on your progress. Tell your care teamif your symptoms do not start to get better or if they get worse. Do not take other medications that contain aspirin, ibuprofen, or naproxen with this medication. Side effects such as stomach upset, nausea, or ulcers may be more likely to occur. Many non-prescription medications contain aspirin,ibuprofen, or naproxen. Always read labels carefully. This medication can cause serious ulcers and bleeding in the stomach. It can happen with no warning. Smoking, drinking alcohol, older age, and poor health can also increase risks. Call your care team right away if you have stomachpain or blood in your vomit or stool. This medication does not prevent a heart attack or stroke. This medication may increase the chance of a heart attack or stroke. The chance may increase the longer you use this medication or if you have heart disease. If you take aspirin to prevent a heart attack or stroke, talk to your care team about usingthis medication. Alcohol may interfere with the effect of this medication. Avoid alcoholicdrinks. This medication may cause serious skin reactions. They can happen weeks to months after starting the medication. Contact your care team right away if you notice fevers or flu-like symptoms with a rash. The rash may be red or purple and then turn into blisters or peeling of the skin. Or, you might notice a red rash with swelling of the face, lips or lymph nodes in your neck or under yourarms. Talk to your care team if you are pregnant before taking this medication. Taking this medication between weeks 20 and 30 of pregnancy may harm your unborn baby. Your care team will monitor you closely if you need to take  it.After 30 weeks of pregnancy, do not take this medication. You may get drowsy or dizzy. Do not drive, use machinery, or do anything that needs mental alertness until you know how this medication affects you. Do not stand up or sit up quickly, especially if you are an older patient. Thisreduces the risk of dizzy or fainting spells. Be careful brushing or flossing your teeth or using a toothpick because you may get an infection or bleed more easily. If you have any dental work done, Primary school teacher you are receiving this medication. This medication may make it more difficult to get pregnant. Talk to your careteam if you are concerned about your fertility. What side effects may I notice from receiving this medication? Side effects that you should report to your care team as soon as possible: Allergic reactions-skin rash, itching, hives, swelling of the face, lips, tongue, or throat Bleeding-bloody or black, tar-like stools, vomiting blood or brown material that looks like coffee grounds, red or dark brown urine, small red or purple spots on skin, unusual bruising or bleeding Heart attack-pain or tightness in the chest, shoulders, arms, or jaw, nausea, shortness of breath, cold or clammy skin, feeling faint or lightheaded Heart failure-shortness of breath, swelling of ankles, feet, or hands, sudden weight gain, unusual weakness or fatigue Increase in blood pressure Kidney injury-decrease in the amount of urine, swelling of the ankles, hands, or feet Liver injury-right upper belly pain, loss of appetite, nausea, light-colored stool, dark yellow or brown  urine, yellowing skin or eyes, unusual weakness or fatigue Rash, fever, and swollen lymph nodes Redness, blistering, peeling, or loosening of the skin, including inside the mouth Stroke-sudden numbness or weakness of the face, arm, or leg, trouble speaking, confusion, trouble walking, loss of balance or coordination, dizziness, severe headache, change in  vision Side effects that usually do not require medical attention (report to your careteam if they continue or are bothersome): Headache Loss of appetite Nausea Upset stomach This list may not describe all possible side effects. Call your doctor for medical advice about side effects. You may report side effects to FDA at1-800-FDA-1088. Where should I keep my medication? Keep out of the reach of children and pets. Store at room temperature between 15 and 30 degrees C (59 and 86 degrees F). Get rid of any unused medication after the expiration date. To get rid of medications that are no longer needed or have expired: Take the medication to a medication take-back program. Check with your pharmacy or law enforcement to find a location. If you cannot return the medication, check the label or package insert to see if the medication should be thrown out in the garbage or flushed down the toilet. If you are not sure, ask your care team. If it is safe to put it in the trash, empty the medication out of the container. Mix the medication with cat litter, dirt, coffee grounds, or other unwanted substance. Seal the mixture in a bag or container. Put it in the trash.  Sulfasalazine tablets What is this medication? SULFASALAZINE (sul fa SAL a zeen) is used to treat ulcerative colitis. This medicine may be used for other purposes; ask your health care provider orpharmacist if you have questions. COMMON BRAND NAME(S): Azulfidine, Sulfazine What should I tell my care team before I take this medication? They need to know if you have any of these conditions: asthma blood disorders or anemia glucose-6-phosphate dehydrogenase (G6PD) deficiency intestinal obstruction kidney disease liver disease porphyria urinary tract obstruction an unusual reaction to sulfasalazine, sulfa drugs, salicylates, or other medicines, foods, dyes, or preservatives pregnant or trying to get pregnant breast-feeding How should I  use this medication? Take this medicine by mouth with a full glass of water. Follow the directions on the prescription label. If the medicine upsets your stomach, take it with food or milk. Take your medicine at regular intervals. Do not take your medicine more often than directed. Do not stop taking except on your doctor'sadvice. Talk to your pediatrician regarding the use of this medicine in children. While this drug may be prescribed for children as young as 6 years for selectedconditions, precautions do apply. Patients over 30 years old may have a stronger reaction and need a smaller dose. Overdosage: If you think you have taken too much of this medicine contact apoison control center or emergency room at once. NOTE: This medicine is only for you. Do not share this medicine with others. What if I miss a dose? If you miss a dose, take it as soon as you can. If it is almost time for yournext dose, take only that dose. Do not take double or extra doses. What may interact with this medication? digoxin folic acid This list may not describe all possible interactions. Give your health care provider a list of all the medicines, herbs, non-prescription drugs, or dietary supplements you use. Also tell them if you smoke, drink alcohol, or use illegaldrugs. Some items may interact with your medicine. What should I watch  for while using this medication? Visit your doctor or health care professional for regular checks on yourprogress. You will need frequent blood and urine checks. This medicine can make you more sensitive to the sun. Keep out of the sun. If you cannot avoid being in the sun, wear protective clothing and use sunscreen.Do not use sun lamps or tanning beds/booths. Drink plenty of water while taking this medicine. What side effects may I notice from receiving this medication? Side effects that you should report to your doctor or health care professionalas soon as possible: allergic reactions  like skin rash, itching or hives, swelling of the face, lips, or tongue fever, chills, or any other sign of infection painful, difficult, or reduced urination redness, blistering, peeling or loosening of the skin, including inside the mouth severe stomach pain unusual bleeding or bruising unusually weak or tired yellowing of the skin or eyes Side effects that usually do not require medical attention (report to yourdoctor or health care professional if they continue or are bothersome): headache loss of appetite nausea, vomiting orange color to the urine reduced sperm count This list may not describe all possible side effects. Call your doctor for medical advice about side effects. You may report side effects to FDA at1-800-FDA-1088. Where should I keep my medication? Keep out of the reach of children. Store at room temperature between 15 and 30 degrees C (59 and 86 degrees F). Keep container tightly closed. Throw away any unused medicine after theexpiration date. NOTE: This sheet is a summary. It may not cover all possible information. If you have questions about this medicine, talk to your doctor, pharmacist, orhealth care provider.  2022 Elsevier/[employer] Standard (2008-01-23 11:38:15)

## 2020-12-31 NOTE — Progress Notes (Signed)
Office Visit Note  Patient: Terri Douglas             Date of Birth: August 13, 1945           MRN: [id number]             PCP: Kathyrn Drown, MD Referring: Kathyrn Drown, MD Visit Date: 12/31/2020   Subjective:  Other (Right knee, left foot and right hand pain and swelling. Patient also reports back pain and right elbow pain. Patient states the pain and swelling have both worsened since office visit on Monday. Patient does report slight improvement in the right knee after aspiration and cortisone injection. Patient would like to know if she should resume taking a bone health vitamin. )   History of Present Illness: Terri Douglas is a 75 y.o. female here for follow up for pain and swelling of multiple joints after recent visit 3 days ago.  Right knee aspiration injection at that time she has noticed partial improvement of symptoms.  Fluid analysis demonstrated inflammation with no evidence of infection or crystals seen.  However she has increased symptoms in the right elbow and right hand and left foot especially.  These remain persistently swollen with a lot of pain and and decreased range of movement.  Previous HPI: 12/28/20 Terri Douglas is a 75 y.o. female here for joint pain of multiple sites ongoing since a hospitalization last month.  Currently prescribed allopurinol 100 mg daily. She has a more chronic history of osteoarthritis in multiple areas with chronic hip pain.  She has a history of gout with previous attacks in the left and right great toe on treatment with allopurinol 100 mg daily.  She denies any history of previous gout attack outside of her feet. The symptoms have been and there typical state for some time until she became ill with flank pain and diarrhea symptoms seen at the hospital with work-up indicating uncomplicated yersinia enterocolitis enteritis.  She was treated with colchicine for suspected gout flare but stopped due to the diarrhea and treated with prednisone with  improvement of symptoms.  She was most recently prescribed oral steroids about 3 weeks ago and primary care follow-up this improves her symptoms though she notices return after stopping it. Currently she has inflammation of multiple sites particularly in the right thumb and index finger and the right knee and in the left foot.  The right knee is her worst problem she reports swelling that started in the front and has now also involve the back of the knee limiting her ability to flex this and cannot assume a normal seated position.  Review of Systems  Constitutional:  Negative for fatigue.  HENT:  Positive for mouth dryness. Negative for mouth sores and nose dryness.   Eyes:  Negative for pain, itching and dryness.  Respiratory:  Negative for shortness of breath and difficulty breathing.   Cardiovascular:  Negative for chest pain and palpitations.  Gastrointestinal:  Negative for blood in stool, constipation and diarrhea.  Endocrine: Negative for increased urination.  Genitourinary:  Negative for difficulty urinating.  Musculoskeletal:  Positive for joint pain, joint pain, joint swelling, myalgias, morning stiffness, muscle tenderness and myalgias.  Skin:  Negative for color change, rash and redness.  Allergic/Immunologic: Negative for susceptible to infections.  Neurological:  Negative for dizziness, numbness, headaches, memory loss and weakness.  Hematological:  Negative for bruising/bleeding tendency.  Psychiatric/Behavioral:  Negative for confusion.    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/2020   Arthritis 11/20/2020   Diarrhea 11/20/2020   Hyponatremia 11/20/2020   Hypokalemia 11/20/2020   Generalized weakness 11/20/2020   Leukocytosis 11/20/2020   Ambulatory dysfunction 11/20/2020   Hypomagnesemia 11/20/2020   Dehydration  11/19/2020   Laryngopharyngeal reflux (LPR) 09/28/2020   Primary osteoarthritis of right hip 01/17/2020   Hip pain, bilateral 01/14/2020   Decreased hearing 01/14/2020   Pedal edema 11/19/2019   Hoarseness    Constipation    Other dysphagia    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   Insomnia 03/15/2019   Abdominal pain, epigastric 05/09/2016   Belching 05/09/2016   Hyperlipidemia 06/18/2014   Hyperglycemia 06/18/2014   Gout 06/18/2014   Osteopenia 10/28/2013   Right foot pain 01/31/2013   Hypertension 03/29/2011   High cholesterol 03/29/2011    Past Medical History:  Diagnosis Date   Arthritis    Complication of anesthesia    Gall stones    GERD (gastroesophageal reflux disease)    History of cardiac catheterization 2005   Minor elevation in cardiac enzymes however no significant obstructive CAD   Hyperlipidemia    Hypertension    Insomnia    PONV (postoperative nausea and vomiting)    Prediabetes     Family History  Problem Relation Age of Onset   Bone cancer Mother    Cancer Mother    Prostate cancer Father    Cancer Father    Heart disease Father    Prostate cancer Brother    Heart disease Brother    Early death Son    Healthy Son    Healthy Daughter    Anesthesia problems Neg Hx    Hypotension Neg Hx    Malignant hyperthermia Neg Hx    Pseudochol deficiency Neg Hx    Past Surgical History:  Procedure Laterality Date   A-FLUTTER ABLATION N/A 08/01/2017   Procedure: A-FLUTTER ABLATION;  Surgeon: Thompson Grayer, MD;  Location: [facility];  Service: Cardiovascular;  Laterality: N/A;   ABDOMINAL HYSTERECTOMY     BACK SURGERY  1992   CARDIAC CATHETERIZATION     BACK IN 2004  SHE THINKS IT CAME BACK 'NORMAL'   CHOLECYSTECTOMY N/A 06/24/2015   Procedure: LAPAROSCOPIC CHOLECYSTECTOMY;  Surgeon: Mickeal Skinner, MD;  Location: [facility];  Service: General;  Laterality: N/A;   COLONOSCOPY N/A 08/30/2012   Procedure: COLONOSCOPY;   Surgeon: Rogene Houston, MD;  [facility];  Service: Endoscopy;  Laterality: N/A;  [place]     ESOPHAGOGASTRODUODENOSCOPY  04/15/2011   Procedure: ESOPHAGOGASTRODUODENOSCOPY (EGD);  Surgeon: Rogene Houston, MD;  [facility];  Service: Endoscopy;  Laterality: N/A;  11:30   ESOPHAGOGASTRODUODENOSCOPY N/A 05/27/2016   Procedure: ESOPHAGOGASTRODUODENOSCOPY (EGD);  Surgeon: Rogene Houston, MD;  [facility];  Service: Endoscopy;  Laterality: N/A;  11:15   EYE SURGERY     cataract removal   NECK SURGERY  12/13/2018   REVERSE SHOULDER ARTHROPLASTY Left 04/03/2019   Procedure: REVERSE SHOULDER ARTHROPLASTY;  Surgeon: Hiram Gash, MD;  Location: [facility];  Service: Orthopedics;  Laterality: Left;   VAGINAL HYSTERECTOMY     Social History   Social History Narrative   Not on file   Immunization History  Administered Date(s) Administered   Fluad Quad(high Dose 65+) 03/16/2020  Influenza,inj,Quad PF,6+ Mos 03/20/2015, 04/01/2016   Influenza-Unspecified 07-Feb-1946, 03/06/2012, 03/06/2014, 03/06/2018, 04/06/2018, 03/16/2019   Moderna Sars-Covid-2 Vaccination 07/18/2019, 08/16/2019, 03/20/2020   Pneumococcal Conjugate-13 06/18/2014   Pneumococcal Polysaccharide-23 03/15/2019   Td 11/10/2006   Zoster, Live 03/05/2014     Objective: Vital Signs: BP 126/76 (BP Location: Left Arm, Patient Position: Sitting, Cuff Size: Normal)   Pulse 73   Ht 5' 1.5" (1.562 m)   Wt 134 lb 9.6 oz (61.1 kg)   BMI 25.02 kg/m    Physical Exam Eyes:     Conjunctiva/sclera: Conjunctivae normal.  Skin:    General: Skin is warm and dry.  Neurological:     General: No focal deficit present.     Mental Status: She is alert.  Psychiatric:        Mood and Affect: Mood normal.     Musculoskeletal Exam:  Neck full ROM no tenderness Shoulders full ROM no tenderness or swelling Right elbow decreased extension range of motion tenderness worse around  the medial epicondyle, left elbow normal Wrists full ROM no tenderness or swelling Swelling erythema and tenderness over the right first through third MCP joints with decreased extension and flexion range of motion, no other fingers Knees right with mild swelling and tenderness, full range of motion b/l Left ankle located below around the subtalar joint extending anteriorly to the midfoot with overlying swelling and erythema and warmth  CDAI Exam: CDAI Score: 22  Patient Global: 70 mm; Provider Global: 50 mm Swollen: 6 ; Tender: 6  Joint Exam 12/31/2020      Right  Left  Elbow  Swollen Tender     MCP 1  Swollen Tender     MCP 2  Swollen Tender     MCP 3  Swollen Tender     Knee  Swollen Tender     Subtalar     Swollen Tender     Investigation: No additional findings.  Imaging: No results found.  Recent Labs: Lab Results  Component Value Date   WBC 12.8 (H) 11/26/2020   HGB 13.2 11/26/2020   PLT 695 (H) 11/26/2020   NA 141 12/31/2020   K 5.1 12/31/2020   CL 100 12/31/2020   CO2 31 12/31/2020   GLUCOSE 88 12/31/2020   BUN 19 12/31/2020   CREATININE 1.07 (H) 12/31/2020   BILITOT 0.4 12/31/2020   ALKPHOS 93 11/20/2020   AST 14 12/31/2020   ALT 15 12/31/2020   PROT 6.4 12/31/2020   ALBUMIN 2.6 (L) 11/21/2020   CALCIUM 9.2 12/31/2020   GFRAA 65 05/21/2020   QFTBGOLDPLUS NEGATIVE 12/31/2020    Speciality Comments: No specialty comments available.  Procedures:  No procedures performed Allergies: Ibuprofen and Phenobarbital   Assessment / Plan:     Visit Diagnoses: Reactive arthritis of multiple sites ([place]) - Plan: celecoxib (CELEBREX) 100 MG capsule, sulfaSALAzine (AZULFIDINE) 500 MG tablet  Oligoarticular inflammatory arthritis looks most consistent with a reactive arthritis after recent yersinia gastrointestinal infection.  Recommend treatment starting sulfasalazine 500 mg weekly titrating up to 3 times daily.  Recommend she can try taking the Celebrex 100 mg  twice daily for inflammation in the short-term.  If she is unable to tolerate the medicine or has lack of response could also consider short term steroid treatment. She describes a previous allergy with hives and rash to use ibuprofen discussed this is not always a drug and serious cross-reactivity but to be watch for this and discontinue this and take Benadryl if she notices any  hives and let us know.  High risk medication use - Plan: COMPLETE METABOLIC PANEL WITH GFR, Hepatitis C antibody, Hepatitis B surface antigen, Hepatitis B core antibody, IgM, QuantiFERON-TB Gold Plus  Baseline medication monitoring for sulfasalazine treatment with metabolic panel and hepatitis serology.  Also check baseline TB screening in case symptoms become prolonged or refractory requiring change in treatment.  Orders: Orders Placed This Encounter  Procedures   COMPLETE METABOLIC PANEL WITH GFR   Hepatitis C antibody   Hepatitis B surface antigen   Hepatitis B core antibody, IgM   QuantiFERON-TB Gold Plus    Meds ordered this encounter  Medications   celecoxib (CELEBREX) 100 MG capsule    Sig: Take 1 capsule (100 mg total) by mouth 2 (two) times daily.    Dispense:  60 capsule    Refill:  0   sulfaSALAzine (AZULFIDINE) 500 MG tablet    Sig: Take 1 tablet (500 mg total) by mouth 3 (three) times daily.    Dispense:  90 tablet    Refill:  0      Follow-Up Instructions: Return in about 4 weeks (around 01/28/2021).   Collier Salina, MD  Note - This record has been created using [employer].  Chart creation errors have been sought, but may not always  have been located. Such creation errors do not reflect on  the standard of medical care.

## 2021-01-02 LAB — SYNOVIAL FLUID ANALYSIS, COMPLETE
Basophils, %: 0 %
Eosinophils-Synovial: 0 % (ref 0–2)
Lymphocytes-Synovial Fld: 11 % (ref 0–74)
Monocyte/Macrophage: 39 % (ref 0–69)
Neutrophil, Synovial: 50 % — ABNORMAL HIGH (ref 0–24)
Synoviocytes, %: 0 % (ref 0–15)
WBC, Synovial: 2139 cells/uL — ABNORMAL HIGH (ref <150)

## 2021-01-02 LAB — ANAEROBIC AND AEROBIC CULTURE
AER RESULT:: NO GROWTH
MICRO NUMBER:: 12158109
MICRO NUMBER:: 12158110
SPECIMEN QUALITY:: ADEQUATE
SPECIMEN QUALITY:: ADEQUATE

## 2021-01-03 LAB — COMPLETE METABOLIC PANEL WITH GFR
AG Ratio: 1.5 (calc) (ref 1.0–2.5)
ALT: 15 U/L (ref 6–29)
AST: 14 U/L (ref 10–35)
Albumin: 3.8 g/dL (ref 3.6–5.1)
Alkaline phosphatase (APISO): 76 U/L (ref 37–153)
BUN/Creatinine Ratio: 18 (calc) (ref 6–22)
BUN: 19 mg/dL (ref 7–25)
CO2: 31 mmol/L (ref 20–32)
Calcium: 9.2 mg/dL (ref 8.6–10.4)
Chloride: 100 mmol/L (ref 98–110)
Creat: 1.07 mg/dL — ABNORMAL HIGH (ref 0.60–1.00)
Globulin: 2.6 g/dL (calc) (ref 1.9–3.7)
Glucose, Bld: 88 mg/dL (ref 65–99)
Potassium: 5.1 mmol/L (ref 3.5–5.3)
Sodium: 141 mmol/L (ref 135–146)
Total Bilirubin: 0.4 mg/dL (ref 0.2–1.2)
Total Protein: 6.4 g/dL (ref 6.1–8.1)
eGFR: 55 mL/min/{1.73_m2} — ABNORMAL LOW (ref >=60)

## 2021-01-03 LAB — HEPATITIS B SURFACE ANTIGEN: Hepatitis B Surface Ag: NONREACTIVE

## 2021-01-03 LAB — QUANTIFERON-TB GOLD PLUS
Mitogen-NIL: 0.89 IU/mL
NIL: 0.05 IU/mL
QuantiFERON-TB Gold Plus: NEGATIVE
TB1-NIL: <0 IU/mL
TB2-NIL: <0 IU/mL

## 2021-01-03 LAB — HEPATITIS C ANTIBODY
Hepatitis C Ab: NONREACTIVE
SIGNAL TO CUT-OFF: 0 (ref <1.00)

## 2021-01-03 LAB — HEPATITIS B CORE ANTIBODY, IGM: Hep B C IgM: NONREACTIVE

## 2021-01-05 ENCOUNTER — Encounter: Payer: Self-pay | Admitting: Family Medicine

## 2021-01-05 ENCOUNTER — Ambulatory Visit (INDEPENDENT_AMBULATORY_CARE_PROVIDER_SITE_OTHER): Payer: Medicare Other | Admitting: Family Medicine

## 2021-01-05 ENCOUNTER — Other Ambulatory Visit: Payer: Self-pay

## 2021-01-05 VITALS — BP 137/81 | HR 77 | Temp 97.4°F | Ht 61.5 in | Wt 136.0 lb

## 2021-01-05 DIAGNOSIS — E7849 Other hyperlipidemia: Secondary | ICD-10-CM | POA: Diagnosis not present

## 2021-01-05 DIAGNOSIS — M0230 Reiter's disease, unspecified site: Secondary | ICD-10-CM

## 2021-01-05 NOTE — Progress Notes (Signed)
   Subjective:    Patient ID: Terri Douglas, female    DOB: 11-Dec-1945, 75 y.o.   MRN: [id number]  Arthritis Reactive arthritis - finger and foot still swollen , Being tx by specialist Doing slightly better   Review of Systems  Musculoskeletal:  Positive for arthritis.      Objective:   Physical Exam General-in no acute distress Eyes-no discharge Lungs-respiratory rate normal, CTA CV-no murmurs,RRR Extremities skin warm dry no edema Neuro grossly normal Behavior normal, alert  Right hand and left foot still with swelling and pain     Assessment & Plan:  1. Reactive arthritis, unspecified site ([place]) By specialist  2. Other hyperlipidemia Taking med and watching diet  Check labs later this year Fu in 4 months

## 2021-01-11 ENCOUNTER — Ambulatory Visit: Payer: Medicare Other | Admitting: Internal Medicine

## 2021-01-27 NOTE — Progress Notes (Signed)
Office Visit Note  Patient: Terri Douglas             Date of Birth: [birth year]           MRN: [id number]             PCP: Kathyrn Drown, MD Referring: Kathyrn Drown, MD Visit Date: 01/28/2021   Subjective:  Follow-up (Patient complains of continued right elbow pain. Patient has noticed improvement with right knee swelling and pain after aspiration. Patient is taking Celebrex and Sulfasalazine and feels as if symptoms have improved but she doesn't feel great and is curious as to whether or not those are side effects of the medication.)   History of Present Illness: Terri Douglas is a 75 y.o. female here for follow up for reactive arthritis after starting sulfasalazine up to 500 mg TID and continuing celebrex. Right knee aspiration for large effusion showing inflammatory fluid without organisms or crystals. She increased the sulfasalazine to 3 times daily after 1 week and she says about 3 days later started to notice more fatigue, dry eyes and mouth, hoarseness of voice by the end of the day, and general malaise. No GI intolerance issues reported. She continues to have right elbow pain intermittently worst around the medial epicondyle. Bilateral foot swelling that is worst on the left worst towards the ankle but sometimes extends across the whole foot.   Previous HPI: 12/31/20 Terri Douglas is a 75 y.o. female here for follow up for pain and swelling of multiple joints after recent visit 3 days ago.  Right knee aspiration injection at that time she has noticed partial improvement of symptoms.  Fluid analysis demonstrated inflammation with no evidence of infection or crystals seen.  However she has increased symptoms in the right elbow and right hand and left foot especially.  These remain persistently swollen with a lot of pain and and decreased range of movement.  12/28/20 Terri Douglas is a 75 y.o. female here for joint pain of multiple sites ongoing since a hospitalization last month.   Currently prescribed allopurinol 100 mg daily. She has a more chronic history of osteoarthritis in multiple areas with chronic hip pain.  She has a history of gout with previous attacks in the left and right great toe on treatment with allopurinol 100 mg daily.  She denies any history of previous gout attack outside of her feet. The symptoms have been and there typical state for some time until she became ill with flank pain and diarrhea symptoms seen at the hospital with work-up indicating uncomplicated yersinia enterocolitis enteritis.  She was treated with colchicine for suspected gout flare but stopped due to the diarrhea and treated with prednisone with improvement of symptoms.  She was most recently prescribed oral steroids about 3 weeks ago and primary care follow-up this improves her symptoms though she notices return after stopping it. Currently she has inflammation of multiple sites particularly in the right thumb and index finger and the right knee and in the left foot.  The right knee is her worst problem she reports swelling that started in the front and has now also involve the back of the knee limiting her ability to flex this and cannot assume a normal seated position.   Review of Systems  Constitutional:  Positive for fatigue.  HENT:  Negative for mouth sores, mouth dryness and nose dryness.   Eyes:  Positive for visual disturbance and dryness. Negative for pain and itching.  Respiratory:  Negative for cough, hemoptysis, shortness of breath and difficulty breathing.   Cardiovascular:  Negative for chest pain, palpitations and swelling in legs/feet.  Gastrointestinal:  Negative for abdominal pain, blood in stool, constipation and diarrhea.  Endocrine: Negative for increased urination.  Genitourinary:  Negative for painful urination.  Musculoskeletal:  Positive for joint pain, joint pain, joint swelling and morning stiffness. Negative for myalgias, muscle weakness, muscle tenderness and  myalgias.  Skin:  Positive for rash. Negative for color change and redness.  Allergic/Immunologic: Negative for susceptible to infections.  Neurological:  Negative for dizziness, numbness, headaches, memory loss and weakness.  Hematological:  Negative for swollen glands.  Psychiatric/Behavioral:  Negative for confusion and sleep disturbance.    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/22/2020   Arthritis 11/20/2020   Diarrhea 11/20/2020   Hyponatremia 11/20/2020   Hypokalemia 11/20/2020   Generalized weakness 11/20/2020   Leukocytosis 11/20/2020   Ambulatory dysfunction 11/20/2020   Hypomagnesemia 11/20/2020   Dehydration 11/19/2020   Laryngopharyngeal reflux (LPR) 09/28/2020   Primary osteoarthritis of right hip 01/17/2020   Hip pain, bilateral 01/14/2020   Decreased hearing 01/14/2020   Pedal edema 11/19/2019   Hoarseness    Constipation    Other dysphagia    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   Insomnia 03/15/2019   Abdominal pain, epigastric 05/09/2016   Belching 05/09/2016   Hyperlipidemia 06/18/2014   Hyperglycemia 06/18/2014   Gout 06/18/2014   Osteopenia 10/28/2013   Right foot pain 01/31/2013   Hypertension 03/29/2011   High cholesterol 03/29/2011    Past Medical History:  Diagnosis Date   Arthritis    Complication of anesthesia    Gall stones    GERD (gastroesophageal reflux disease)    History of cardiac catheterization 2005   Minor elevation in cardiac enzymes however no significant obstructive CAD   Hyperlipidemia    Hypertension    Insomnia    PONV (postoperative nausea and vomiting)    Prediabetes     Family History  Problem Relation Age of Onset   Bone cancer Mother    Cancer Mother    Prostate cancer Father    Cancer Father    Heart disease Father    Prostate cancer  Brother    Heart disease Brother    Early death Son    Healthy Son    Healthy Daughter    Anesthesia problems Neg Hx    Hypotension Neg Hx    Malignant hyperthermia Neg Hx    Pseudochol deficiency Neg Hx    Past Surgical History:  Procedure Laterality Date   A-FLUTTER ABLATION N/A 08/01/2017   Procedure: A-FLUTTER ABLATION;  Surgeon: Thompson Grayer, MD;  Location: [facility];  Service: Cardiovascular;  Laterality: N/A;   ABDOMINAL HYSTERECTOMY     BACK SURGERY  1992   CARDIAC CATHETERIZATION     BACK IN 2004  SHE THINKS IT CAME BACK 'NORMAL'   CHOLECYSTECTOMY N/A 06/24/2015   Procedure: LAPAROSCOPIC CHOLECYSTECTOMY;  Surgeon: Mickeal Skinner, MD;  Location: [facility];  Service: General;  Laterality: N/A;   COLONOSCOPY N/A 08/30/2012   Procedure: COLONOSCOPY;  Surgeon: Rogene Houston, MD;  [facility];  Service: Endoscopy;  Laterality: N/A;  [place]     ESOPHAGOGASTRODUODENOSCOPY  04/15/2011   Procedure:  ESOPHAGOGASTRODUODENOSCOPY (EGD);  Surgeon: Rogene Houston, MD;  [facility];  Service: Endoscopy;  Laterality: N/A;  11:30   ESOPHAGOGASTRODUODENOSCOPY N/A 05/27/2016   Procedure: ESOPHAGOGASTRODUODENOSCOPY (EGD);  Surgeon: Rogene Houston, MD;  [facility];  Service: Endoscopy;  Laterality: N/A;  11:15   EYE SURGERY     cataract removal   NECK SURGERY  12/13/2018   REVERSE SHOULDER ARTHROPLASTY Left 04/03/2019   Procedure: REVERSE SHOULDER ARTHROPLASTY;  Surgeon: Hiram Gash, MD;  Location[facility];  Service: Orthopedics;  Laterality: Left;   VAGINAL HYSTERECTOMY     Social History   Social History Narrative   Not on file   Immunization History  Administered Date(s) Administered   Fluad Quad(high Dose 65+) 03/16/2020   Influenza,inj,Quad PF,6+ Mos 03/20/2015, 04/01/2016   Influenza-Unspecified May 26, 1946, 03/06/2012, 03/06/2014, 03/06/2018, 04/06/2018, 03/16/2019   Moderna Sars-Covid-2 Vaccination  07/18/2019, 08/16/2019, 03/20/2020   Pneumococcal Conjugate-13 06/18/2014   Pneumococcal Polysaccharide-23 03/15/2019   Td 11/10/2006   Zoster, Live 03/05/2014     Objective: Vital Signs: BP (!) 145/80 (BP Location: Left Arm, Patient Position: Sitting, Cuff Size: Normal)   Pulse 70   Ht 5' 1.5" (1.562 m)   Wt 137 lb 3.2 oz (62.2 kg)   BMI 25.50 kg/m    Physical Exam Skin:    General: Skin is warm and dry.     Comments: Left forearm extensor surface patches of purpura  Neurological:     Mental Status: She is alert.  Psychiatric:        Mood and Affect: Mood normal.     Musculoskeletal Exam:  Shoulders full ROM no tenderness or swelling Elbows full ROM no tenderness or swelling Wrists full ROM no tenderness or swelling Fingers full ROM no tenderness or swelling Bilateral knees full ROM no effusions, patellofemoral crepitus present Ankles full RO overlying swelling extends from above the ankle to the midfoot without warmth or erythema, no tenderness to pressure   Investigation: No additional findings.  Imaging: No results found.  Recent Labs: Lab Results  Component Value Date   WBC 12.8 (H) 11/26/2020   HGB 13.2 11/26/2020   PLT 695 (H) 11/26/2020   NA 141 12/31/2020   K 5.1 12/31/2020   CL 100 12/31/2020   CO2 31 12/31/2020   GLUCOSE 88 12/31/2020   BUN 19 12/31/2020   CREATININE 1.07 (H) 12/31/2020   BILITOT 0.4 12/31/2020   ALKPHOS 93 11/20/2020   AST 14 12/31/2020   ALT 15 12/31/2020   PROT 6.4 12/31/2020   ALBUMIN 2.6 (L) 11/21/2020   CALCIUM 9.2 12/31/2020   GFRAA 65 05/21/2020   QFTBGOLDPLUS NEGATIVE 12/31/2020    Speciality Comments: No specialty comments available.  Procedures:  No procedures performed Allergies: Ibuprofen and Phenobarbital   Assessment / Plan:     Visit Diagnoses: Reactive arthritis of multiple sites ([place]) - Plan: Sedimentation rate  Continued symptoms involving right elbow and bilateral feet although right knee is  improved after aspiration and injection. No impressive change in the other sites so far. She feels worsening of generalized symptoms questions if related to the sulfasalazine. These are atypical but recommend she can stop for now and should improve quickly if this is the cause. Will check sedimentation rate today. Continue celebrex 100 mg PO BID.  Idiopathic gout, unspecified chronicity, unspecified site  Residual hyperpigmentation at the left great toe but no new flares. Based on fluid analysis from prior visit does not appear to be the cause of current  effusions. Discussed purine diet and adding some foods back to diet but in moderation.  High risk medication use - Plan: CBC with Differential/Platelet, COMPLETE METABOLIC PANEL WITH GFR  Sulfasalazine toxicity monitoring will check CBC and CMP today.   Orders: Orders Placed This Encounter  Procedures   Sedimentation rate   CBC with Differential/Platelet   COMPLETE METABOLIC PANEL WITH GFR   No orders of the defined types were placed in this encounter.    Follow-Up Instructions: Return in about 4 weeks (around 02/25/2021) for ReA on SSZ f/u ~4wks.   Collier Salina, MD  Note - This record has been created using [employer].  Chart creation errors have been sought, but may not always  have been located. Such creation errors do not reflect on  the standard of medical care.

## 2021-01-28 ENCOUNTER — Encounter: Payer: Self-pay | Admitting: Internal Medicine

## 2021-01-28 ENCOUNTER — Ambulatory Visit (INDEPENDENT_AMBULATORY_CARE_PROVIDER_SITE_OTHER): Payer: Medicare Other | Admitting: Internal Medicine

## 2021-01-28 ENCOUNTER — Other Ambulatory Visit: Payer: Self-pay

## 2021-01-28 VITALS — BP 145/80 | HR 70 | Ht 61.5 in | Wt 137.2 lb

## 2021-01-28 DIAGNOSIS — Z79899 Other long term (current) drug therapy: Secondary | ICD-10-CM

## 2021-01-28 DIAGNOSIS — M1000 Idiopathic gout, unspecified site: Secondary | ICD-10-CM | POA: Diagnosis not present

## 2021-01-28 DIAGNOSIS — M0239 Reiter's disease, multiple sites: Secondary | ICD-10-CM

## 2021-01-29 LAB — COMPLETE METABOLIC PANEL WITH GFR
AG Ratio: 1.4 (calc) (ref 1.0–2.5)
ALT: 11 U/L (ref 6–29)
AST: 16 U/L (ref 10–35)
Albumin: 3.8 g/dL (ref 3.6–5.1)
Alkaline phosphatase (APISO): 84 U/L (ref 37–153)
BUN: 14 mg/dL (ref 7–25)
CO2: 31 mmol/L (ref 20–32)
Calcium: 9.3 mg/dL (ref 8.6–10.4)
Chloride: 100 mmol/L (ref 98–110)
Creat: 0.93 mg/dL (ref 0.60–1.00)
Globulin: 2.7 g/dL (calc) (ref 1.9–3.7)
Glucose, Bld: 81 mg/dL (ref 65–99)
Potassium: 4.4 mmol/L (ref 3.5–5.3)
Sodium: 142 mmol/L (ref 135–146)
Total Bilirubin: 0.3 mg/dL (ref 0.2–1.2)
Total Protein: 6.5 g/dL (ref 6.1–8.1)
eGFR: 64 mL/min/{1.73_m2} (ref >=60)

## 2021-01-29 LAB — CBC WITH DIFFERENTIAL/PLATELET
Absolute Monocytes: 621 cells/uL (ref 200–950)
Basophils Absolute: 70 cells/uL (ref 0–200)
Basophils Relative: 1.1 %
Eosinophils Absolute: 58 cells/uL (ref 15–500)
Eosinophils Relative: 0.9 %
HCT: 40.6 % (ref 35.0–45.0)
Hemoglobin: 12.3 g/dL (ref 11.7–15.5)
Lymphs Abs: 1075 cells/uL (ref 850–3900)
MCH: 27.1 pg (ref 27.0–33.0)
MCHC: 30.3 g/dL — ABNORMAL LOW (ref 32.0–36.0)
MCV: 89.4 fL (ref 80.0–100.0)
MPV: 10 fL (ref 7.5–12.5)
Monocytes Relative: 9.7 %
Neutro Abs: 4576 cells/uL (ref 1500–7800)
Neutrophils Relative %: 71.5 %
Platelets: 461 10*3/uL — ABNORMAL HIGH (ref 140–400)
RBC: 4.54 10*6/uL (ref 3.80–5.10)
RDW: 13 % (ref 11.0–15.0)
Total Lymphocyte: 16.8 %
WBC: 6.4 10*3/uL (ref 3.8–10.8)

## 2021-01-29 LAB — SEDIMENTATION RATE: Sed Rate: 55 mm/h — ABNORMAL HIGH (ref 0–30)

## 2021-02-04 ENCOUNTER — Telehealth: Payer: Self-pay

## 2021-02-04 DIAGNOSIS — M0239 Reiter's disease, multiple sites: Secondary | ICD-10-CM

## 2021-02-04 MED ORDER — CELECOXIB 100 MG PO CAPS: 100.0000 mg | ORAL_CAPSULE | Freq: Two times a day (BID) | ORAL | 0 refills | Status: DC

## 2021-02-04 NOTE — Telephone Encounter (Signed)
I called patient, patient verbalized understanding. 

## 2021-02-04 NOTE — Telephone Encounter (Signed)
LMOM for patient to call office. 

## 2021-02-04 NOTE — Telephone Encounter (Signed)
I recommend she continue using the celebrex for now to control inflammation symptoms. New Rx sent to pharmacy for the next month till our f/u.

## 2021-02-04 NOTE — Telephone Encounter (Signed)
Patient called stating since stopping the Sulfasalazine medication she feels much better and has more energy.  Patient states she finished the Celebrex and is checking if Dr. Benjamine Mola wants her to refill that prescription or if there is something else he recommends.  Patient states her left foot is still very swollen.  Patient states she is in a wedding on 03/20/21 and cannot get her shoe on.

## 2021-02-05 ENCOUNTER — Telehealth: Payer: Self-pay

## 2021-02-05 NOTE — Telephone Encounter (Signed)
Spoke with patient, advised, per Dr. Benjamine Mola, discussed stopping the sulfasalazine, if a refill was received by pharmacy she can disregard this. Dr. Benjamine Mola sent a new prescription for celebrex which she can take and he does not think was causing the problem. He planned to wait till our next follow up before discussing alternative longer term medicines that would replace sulfasalazine.

## 2021-02-05 NOTE — Telephone Encounter (Signed)
Patient requested a return call to see if there is another medication she can take in place of the Sulfasalazine.  Patient states she felt awful and had no energy when she was taking it.  Patient states she received a call from the pharmacy that the prescription was sent in, but wanted to check with Dr. Benjamine Mola before picking it up.

## 2021-02-05 NOTE — Telephone Encounter (Signed)
We discussed stopping the sulfasalazine, if a refill was received by pharmacy she can disregard this. I sent a new prescription for celebrex which she can take and I do not think was causing the problem. I planned to wait till our next follow up before discussing alternative longer term medicines that would replace sulfasalazine.

## 2021-02-11 DIAGNOSIS — Z08 Encounter for follow-up examination after completed treatment for malignant neoplasm: Secondary | ICD-10-CM | POA: Diagnosis not present

## 2021-02-11 DIAGNOSIS — Z85828 Personal history of other malignant neoplasm of skin: Secondary | ICD-10-CM | POA: Diagnosis not present

## 2021-02-25 ENCOUNTER — Ambulatory Visit: Payer: Medicare Other | Admitting: Internal Medicine

## 2021-02-28 NOTE — Progress Notes (Signed)
Office Visit Note  Patient: Terri Douglas             Date of Birth: 09/26/1945           MRN: [id number]             PCP: Kathyrn Drown, MD Referring: Kathyrn Drown, MD Visit Date: 03/01/2021   Subjective:  Other (Patient reports right hip pain. )   History of Present Illness: Terri Douglas is a 75 y.o. 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  Since stopping that she has continued ongoing joint pain at multiple sites but feels it is manageable on the Celebrex.  Bili increased problem is having some pain at the lateral right hip and this is particularly bothersome when lying on her side in bed and causing sleep difficulty.  Pain is mostly with direct pressure or when getting up not particularly worsened with weightbearing.   Previous HPI 01/28/21  Terri Douglas is a 75 y.o. female here for follow up for reactive arthritis after starting sulfasalazine up to 500 mg TID and continuing celebrex. Right knee aspiration for large effusion showing inflammatory fluid without organisms or crystals. She increased the sulfasalazine to 3 times daily after 1 week and she says about 3 days later started to notice more fatigue, dry eyes and mouth, hoarseness of voice by the end of the day, and general malaise. No GI intolerance issues reported. She continues to have right elbow pain intermittently worst around the medial epicondyle. Bilateral foot swelling that is worst on the left worst towards the ankle but sometimes extends across the whole foot.     12/28/20 Terri Douglas is a 75 y.o. female here for joint pain of multiple sites ongoing since a hospitalization last month.  Currently prescribed allopurinol 100 mg daily. She has a more chronic history of osteoarthritis in multiple areas with chronic hip pain.  She has a history of gout with previous attacks in the left and right great toe on  treatment with allopurinol 100 mg daily.  She denies any history of previous gout attack outside of her feet. The symptoms have been and there typical state for some time until she became ill with flank pain and diarrhea symptoms seen at the hospital with work-up indicating uncomplicated yersinia enterocolitis enteritis.  She was treated with colchicine for suspected gout flare but stopped due to the diarrhea and treated with prednisone with improvement of symptoms.  She was most recently prescribed oral steroids about 3 weeks ago and primary care follow-up this improves her symptoms though she notices return after stopping it. Currently she has inflammation of multiple sites particularly in the right thumb and index finger and the right knee and in the left foot.  The right knee is her worst problem she reports swelling that started in the front and has now also involve the back of the knee limiting her ability to flex this and cannot assume a normal seated position.   Review of Systems  Constitutional:  Negative for fatigue.  HENT:  Negative for mouth sores, mouth dryness and nose dryness.   Eyes:  Negative for pain, itching and dryness.  Respiratory:  Negative for shortness of breath and difficulty breathing.   Cardiovascular:  Negative for chest pain and palpitations.  Gastrointestinal:  Negative for blood in stool, constipation and diarrhea.  Endocrine: Negative for increased urination.  Genitourinary:  Negative for difficulty urinating.  Musculoskeletal:  Positive for joint pain, joint pain, joint swelling, myalgias, morning stiffness, muscle tenderness and myalgias.  Skin:  Negative for color change, rash and redness.  Allergic/Immunologic: Negative for susceptible to infections.  Neurological:  Positive for dizziness. Negative for numbness, headaches and memory loss.  Hematological:  Positive for bruising/bleeding tendency.  Psychiatric/Behavioral:  Negative for confusion.    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   Insomnia 03/15/2019   Abdominal pain, epigastric 05/09/2016   Belching 05/09/2016   Hyperlipidemia 06/18/2014   Hyperglycemia 06/18/2014   Gout 06/18/2014   Osteopenia 10/28/2013   Right foot pain 01/31/2013   Hypertension 03/29/2011   High cholesterol 03/29/2011    Past Medical History:  Diagnosis Date   Arthritis    Complication of anesthesia    Gall stones    GERD (gastroesophageal reflux disease)    History of cardiac catheterization 2005   Minor elevation in cardiac enzymes however no significant obstructive CAD   Hyperlipidemia    Hypertension    Insomnia    PONV (postoperative nausea and vomiting)    Prediabetes     Family History  Problem Relation Age of Onset   Bone cancer Mother    Cancer Mother    Prostate cancer Father    Cancer Father    Heart disease Father    Prostate cancer Brother    Heart disease Brother    Early death Son    Healthy Son    Healthy Daughter    Anesthesia problems Neg Hx    Hypotension Neg Hx    Malignant hyperthermia Neg Hx    Pseudochol deficiency Neg Hx    Past Surgical History:  Procedure Laterality Date   A-FLUTTER ABLATION N/A 08/01/2017   Procedure: A-FLUTTER ABLATION;  Surgeon: Thompson Grayer, MD;  Location: [facility];  Service: Cardiovascular;  Laterality: N/A;   ABDOMINAL HYSTERECTOMY     BACK SURGERY  1992   CARDIAC  CATHETERIZATION     BACK IN 2004  SHE THINKS IT CAME BACK 'NORMAL'   CHOLECYSTECTOMY N/A 06/24/2015   Procedure: LAPAROSCOPIC CHOLECYSTECTOMY;  Surgeon: Mickeal Skinner, MD;  Location: [facility];  Service: General;  Laterality: N/A;   COLONOSCOPY N/A 08/30/2012   Procedure: COLONOSCOPY;  Surgeon: Rogene Houston, MD;  Location: [facility];  Service: Endoscopy;  Laterality: N/A;  [place]     ESOPHAGOGASTRODUODENOSCOPY  04/15/2011   Procedure: ESOPHAGOGASTRODUODENOSCOPY (EGD);  Surgeon: Rogene Houston, MD;  Location: [facility];  Service: Endoscopy;  Laterality: N/A;  11:30   ESOPHAGOGASTRODUODENOSCOPY N/A 05/27/2016   Procedure: ESOPHAGOGASTRODUODENOSCOPY (EGD);  Surgeon: Rogene Houston, MD;  Location: [facility];  Service: Endoscopy;  Laterality: N/A;  11:15   EYE SURGERY     cataract removal   NECK SURGERY  12/13/2018   REVERSE SHOULDER ARTHROPLASTY Left 04/03/2019   Procedure: REVERSE SHOULDER  ARTHROPLASTY;  Surgeon: Hiram Gash, MD;  Location: [facility];  Service: Orthopedics;  Laterality: Left;   VAGINAL HYSTERECTOMY     Social History   Social History Narrative   Not on file   Immunization History  Administered Date(s) Administered   Fluad Quad(high Dose 65+) 03/16/2020   Influenza, High Dose Seasonal PF 04/07/2018, 03/15/2019   Influenza,inj,Quad PF,6+ Mos 03/20/2015, 04/01/2016   Influenza-Unspecified April 09, 1946, 03/06/2012, 03/06/2014, 03/06/2018, 04/06/2018, 03/16/2019   Moderna Sars-Covid-2 Vaccination 07/18/2019, 08/16/2019, 03/20/2020   Pneumococcal Conjugate-13 06/18/2014   Pneumococcal Polysaccharide-23 03/15/2019   Td 11/10/2006   Zoster Recombinat (Shingrix) 04/07/2018, 06/19/2018   Zoster, Live 03/05/2014     Objective: Vital Signs: BP 128/71 (BP Location: Left Arm, Patient Position: Sitting, Cuff Size: Normal)   Pulse 76   Ht 5' 1.5" (1.562 m)   Wt 137 lb 9.6 oz (62.4 kg)   BMI 25.58 kg/m    Physical Exam Eyes:      Conjunctiva/sclera: Conjunctivae normal.  Musculoskeletal:     Right lower leg: No edema.     Left lower leg: No edema.  Skin:    General: Skin is warm and dry.     Comments: Scattered bruising on forearm extensor surfaces worse on left  Neurological:     Mental Status: She is alert.  Psychiatric:        Mood and Affect: Mood normal.     Musculoskeletal Exam:  Fingers normal ROM no tenderness or palpable synovitis Decreased right hip FABER ROM with posterior pain, tenderness to pressure along superior portion of right SI joint, palpable soft nodule or myofascial bundle  Bilateral knee patellofemoral crepitus left knee slightly swollen compared to right Bilateral 1st MTP bunions with lateral deviation and erythema no palpable swelling or tenderness   Investigation: No additional findings.  Imaging: No results found.  Recent Labs: Lab Results  Component Value Date   WBC 6.4 01/28/2021   HGB 12.3 01/28/2021   PLT 461 (H) 01/28/2021   NA 142 01/28/2021   K 4.4 01/28/2021   CL 100 01/28/2021   CO2 31 01/28/2021   GLUCOSE 81 01/28/2021   BUN 14 01/28/2021   CREATININE 0.93 01/28/2021   BILITOT 0.3 01/28/2021   ALKPHOS 93 11/20/2020   AST 16 01/28/2021   ALT 11 01/28/2021   PROT 6.5 01/28/2021   ALBUMIN 2.6 (L) 11/21/2020   CALCIUM 9.3 01/28/2021   GFRAA 65 05/21/2020   QFTBGOLDPLUS NEGATIVE 12/31/2020    Speciality Comments: No specialty comments available.  Procedures:  No procedures performed Allergies: Ibuprofen and Phenobarbital   Assessment / Plan:     Visit Diagnoses: Reactive arthritis of multiple sites ([place]) - Plan: celecoxib (CELEBREX) 100 MG capsule  Symptoms are mostly controlled with the continued oral NSAID treatment no large joint effusions or out-of-control pain on exam at this time.  Typically symptoms should be self-limited from reactive arthritis if related to the recent bacterial enteritis.  Continue Celebrex 100 mg twice daily can reduce to  once daily if symptoms are well controlled.  Hip pain, bilateral - Plan: cyclobenzaprine (FLEXERIL) 10 MG tablet  Hip pain right worse than left seems more bursitis or muscular not true hip arthropathy.  We will try Flexeril 10 mg at night as needed for pain or spasticity for the symptom interference with sleep.  Orders: No orders of the defined types were placed in this encounter.  Meds ordered this encounter  Medications   celecoxib (CELEBREX) 100 MG capsule    Sig:  Take 1 capsule (100 mg total) by mouth 2 (two) times daily.    Dispense:  60 capsule    Refill:  2   cyclobenzaprine (FLEXERIL) 10 MG tablet    Sig: Take 1 tablet (10 mg total) by mouth at bedtime as needed for muscle spasms.    Dispense:  30 tablet    Refill:  2      Follow-Up Instructions: Return in about 3 months (around 05/31/2021) for ReA/OA/Gout Celebrex f/u 23mos.   Collier Salina, MD  Note - This record has been created using [employer].  Chart creation errors have been sought, but may not always  have been located. Such creation errors do not reflect on  the standard of medical care.

## 2021-03-01 ENCOUNTER — Ambulatory Visit (INDEPENDENT_AMBULATORY_CARE_PROVIDER_SITE_OTHER): Payer: Medicare Other | Admitting: Internal Medicine

## 2021-03-01 ENCOUNTER — Other Ambulatory Visit: Payer: Self-pay

## 2021-03-01 ENCOUNTER — Encounter: Payer: Self-pay | Admitting: Internal Medicine

## 2021-03-01 VITALS — BP 128/71 | HR 76 | Ht 61.5 in | Wt 137.6 lb

## 2021-03-01 DIAGNOSIS — M25552 Pain in left hip: Secondary | ICD-10-CM

## 2021-03-01 DIAGNOSIS — M0239 Reiter's disease, multiple sites: Secondary | ICD-10-CM

## 2021-03-01 DIAGNOSIS — M25551 Pain in right hip: Secondary | ICD-10-CM

## 2021-03-01 MED ORDER — CELECOXIB 100 MG PO CAPS: 100.0000 mg | ORAL_CAPSULE | Freq: Two times a day (BID) | ORAL | 2 refills | Status: DC

## 2021-03-01 MED ORDER — CYCLOBENZAPRINE HCL 10 MG PO TABS
10.0000 mg | ORAL_TABLET | Freq: Every evening | ORAL | 2 refills | Status: DC | PRN
Start: 2021-03-01 — End: 2023-04-12

## 2021-03-16 ENCOUNTER — Ambulatory Visit (INDEPENDENT_AMBULATORY_CARE_PROVIDER_SITE_OTHER): Payer: Medicare Other

## 2021-03-16 ENCOUNTER — Other Ambulatory Visit: Payer: Self-pay

## 2021-03-16 VITALS — BP 120/70 | HR 68 | Temp 98.5°F | Ht 62.0 in | Wt 137.6 lb

## 2021-03-16 DIAGNOSIS — Z0000 Encounter for general adult medical examination without abnormal findings: Secondary | ICD-10-CM | POA: Diagnosis not present

## 2021-03-16 NOTE — Progress Notes (Signed)
Subjective:   Terri Douglas is a 75 y.o. female who presents for an Initial Medicare Annual Wellness Visit.  Review of Systems     Cardiac Risk Factors include: advanced age (>66men, >104 women);sedentary lifestyle     Objective:    Today's Vitals   03/16/21 1030  BP: 120/70  Pulse: 68  Temp: 98.5 F (36.9 C)  Weight: 137 lb 9.6 oz (62.4 kg)  Height: 5\' 2"  (1.575 m)   Body mass index is 25.17 kg/m.  Advanced Directives 03/16/2021 11/20/2020 11/19/2020 03/12/2020 05/15/2019 04/03/2019 03/29/2019  Does Patient Have a Medical Advance Directive? Yes Yes No No No No No  Type of Astronomer Power of Attorney - - - - -  Does patient want to make changes to medical advance directive? - No - Guardian declined - - - - -  Copy of [place] in Chart? No - copy requested No - copy requested - - - - -  Would patient like information on creating a medical advance directive? - No - Patient declined - No - Patient declined No - Patient declined No - Patient declined -    Current Medications (verified) Outpatient Encounter Medications as of 03/16/2021  Medication Sig   allopurinol (ZYLOPRIM) 100 MG tablet Take 1 tablet (100 mg total) by mouth daily.   ALPRAZolam (XANAX) 1 MG tablet TAKE ONE TABLET BY MOUTH AT BEDTIME AS NEEDED   Biotin 5000 MCG TABS Take 5,000 mcg by mouth daily.   celecoxib (CELEBREX) 100 MG capsule Take 1 capsule (100 mg total) by mouth 2 (two) times daily.   cyclobenzaprine (FLEXERIL) 10 MG tablet Take 1 tablet (10 mg total) by mouth at bedtime as needed for muscle spasms.   esomeprazole (NEXIUM) 40 MG capsule Take 1 capsule (40 mg total) by mouth daily with breakfast.   estradiol (ESTRACE) 2 MG tablet Take 2 mg by mouth daily.    finasteride (PROSCAR) 5 MG tablet Take 2.5 mg by mouth daily.    fluticasone (FLONASE) 50 MCG/ACT nasal spray Place 1 spray into both nostrils 2 (two) times daily.   loperamide  (IMODIUM A-D) 2 MG tablet Take 1 tablet (2 mg total) by mouth 4 (four) times daily as needed for diarrhea or loose stools.   Polyethyl Glycol-Propyl Glycol (LUBRICANT EYE DROPS) 0.4-0.3 % SOLN Place 1-2 drops into both eyes daily as needed (dry/irritated eyes.).   potassium chloride (KLOR-CON) 10 MEQ tablet Take 2 tablet in the AM and 2 tablets in the PM   pravastatin (PRAVACHOL) 80 MG tablet TAKE ONE (1) TABLET BY MOUTH EVERY DAY   Probiotic Product (PHILLIPS COLON HEALTH PO) Take 1 capsule by mouth daily as needed (irritable bowel).   sertraline (ZOLOFT) 50 MG tablet Take one tablet daily   torsemide (DEMADEX) 20 MG tablet Take 1 tablet (20 mg total) by mouth every morning.   No facility-administered encounter medications on file as of 03/16/2021.    Allergies (verified) Ibuprofen and Phenobarbital   History: Past Medical History:  Diagnosis Date   Arthritis    Complication of anesthesia    Gall stones    GERD (gastroesophageal reflux disease)    History of cardiac catheterization 2005   Minor elevation in cardiac enzymes however no significant obstructive CAD   Hyperlipidemia    Hypertension    Insomnia    PONV (postoperative nausea and vomiting)    Prediabetes    Past Surgical History:  Procedure Laterality Date  A-FLUTTER ABLATION N/A 08/01/2017   Procedure: A-FLUTTER ABLATION;  Surgeon: Thompson Grayer, MD;  Location: [facility];  Service: Cardiovascular;  Laterality: N/A;   ABDOMINAL HYSTERECTOMY     BACK SURGERY  1992   CARDIAC CATHETERIZATION     BACK IN 2004  SHE THINKS IT CAME BACK 'NORMAL'   CHOLECYSTECTOMY N/A 06/24/2015   Procedure: LAPAROSCOPIC CHOLECYSTECTOMY;  Surgeon: Mickeal Skinner, MD;  Location: [facility];  Service: General;  Laterality: N/A;   COLONOSCOPY N/A 08/30/2012   Procedure: COLONOSCOPY;  Surgeon: Rogene Houston, MD;  Location: [facility];  Service: Endoscopy;  Laterality: N/A;  [facility]      ESOPHAGOGASTRODUODENOSCOPY  04/15/2011   Procedure: ESOPHAGOGASTRODUODENOSCOPY (EGD);  Surgeon: Rogene Houston, MD;  Location: [facility];  Service: Endoscopy;  Laterality: N/A;  11:30   ESOPHAGOGASTRODUODENOSCOPY N/A 05/27/2016   Procedure: ESOPHAGOGASTRODUODENOSCOPY (EGD);  Surgeon: Rogene Houston, MD;  [facility]: [facility];  Service: Endoscopy;  Laterality: N/A;  11:15   EYE SURGERY     cataract removal   NECK SURGERY  12/13/2018   REVERSE SHOULDER ARTHROPLASTY Left 04/03/2019   Procedure: REVERSE SHOULDER ARTHROPLASTY;  Surgeon: Hiram Gash, MD;  Location: [facility];  Service: Orthopedics;  Laterality: Left;   VAGINAL HYSTERECTOMY     Family History  Problem Relation Age of Onset   Bone cancer Mother    Cancer Mother    Prostate cancer Father    Cancer Father    Heart disease Father    Prostate cancer Brother    Heart disease Brother    Early death Son    Healthy Son    Healthy Daughter    Anesthesia problems Neg Hx    Hypotension Neg Hx    Malignant hyperthermia Neg Hx    Pseudochol deficiency Neg Hx    Social History   Socioeconomic History   Marital status: Married    Spouse name: Not on file   Number of children: Not on file   Years of education: Not on file   Highest education level: Not on file  Occupational History   Not on file  Tobacco Use   Smoking status: Never   Smokeless tobacco: Never  Vaping Use   Vaping Use: Never used  Substance and Sexual Activity   Alcohol use: No   Drug use: No   Sexual activity: Never  Other Topics Concern   Not on file  Social History Narrative   Not on file   Social Determinants of Health   Financial Resource Strain: Low Risk    Difficulty of Paying Living Expenses: Not hard at all  Food Insecurity: No Food Insecurity   Worried About Charity fundraiser in the Last Year: Never true   [place] in the Last Year: Never true  Transportation Needs: No Transportation Needs   Lack of Transportation  (Medical): No   Lack of Transportation (Non-Medical): No  Physical Activity: Insufficiently Active   Days of Exercise per Week: 3 days   Minutes of Exercise per Session: 30 min  Stress: No Stress Concern Present   Feeling of Stress : Not at all  Social Connections: Socially Integrated   Frequency of Communication with Friends and Family: More than three times a week   Frequency of Social Gatherings with Friends and Family: More than three times a week   Attends Religious Services: More than 4 times per year  Active Member of Clubs or Organizations: Yes   Attends Music therapist: More than 4 times per year   Marital Status: Married    Tobacco Counseling Counseling given: Not Answered   Clinical Intake:  Pre-visit preparation completed: Yes  Pain : No/denies pain     BMI - recorded: 25.17 Nutritional Status: BMI 25 -29 Overweight Nutritional Risks: None Diabetes: No  How often do you need to have someone help you when you read instructions, pamphlets, or other written materials from your doctor or pharmacy?: 1 - Never  Diabetic?no  Interpreter Needed?: No  Information entered by :: MJ Catlin Aycock, LPN   Activities of Daily Living In your present state of health, do you have any difficulty performing the following activities: 03/16/2021 11/20/2020  Hearing? Y N  Vision? N N  Difficulty concentrating or making decisions? N N  Walking or climbing stairs? N Y  Dressing or bathing? N N  Doing errands, shopping? N N  Preparing Food and eating ? N -  Using the Toilet? N -  In the past six months, have you accidently leaked urine? Y -  Comment Occasional -  Do you have problems with loss of bowel control? N -  Managing your Medications? N -  Managing your Finances? N -  Housekeeping or managing your Housekeeping? N -  Some recent data might be hidden    Patient Care Team: Kathyrn Drown, MD as PCP - General (Family Medicine)  Indicate any recent Medical  Services you may have received from other than [facility] providers in the past year (date may be approximate).     Assessment:   This is a routine wellness examination for Rhodes.  Hearing/Vision screen Hearing Screening - Comments:: Some hearing issues  Vision Screening - Comments:: Readers, Dr. Jorja Loa. Overdue.   Dietary issues and exercise activities discussed: Current Exercise Habits: Home exercise routine, Type of exercise: walking, Time (Minutes): 20, Frequency (Times/Week): 3, Weekly Exercise (Minutes/Week): 60, Intensity: Mild, Exercise limited by: orthopedic condition(s)   Goals Addressed             This Visit's Progress    Exercise 3x per week (30 min per time)       Would like to be able to fish more.        Depression Screen PHQ 2/9 Scores 03/16/2021 05/20/2020 05/29/2017 09/11/2015  PHQ - 2 Score 0 2 5 0  PHQ- 9 Score - 4 6 -    Fall Risk Fall Risk  03/16/2021 05/20/2020 11/19/2019 03/15/2019 04/26/2018  Falls in the past year? - 0 0 0 0  Comment - - - - Emmi Telephone Survey: data to providers prior to load  Number falls in past yr: 0 - - - -  Injury with Fall? 0 - - - -  Risk for fall due to : No Fall Risks - - - -  Follow up Falls prevention discussed Falls evaluation completed Falls evaluation completed Falls evaluation completed -    FALL RISK PREVENTION PERTAINING TO THE HOME:  Any stairs in or around the home? No  If so, are there any without handrails? No  Home free of loose throw rugs in walkways, pet beds, electrical cords, etc? No  Adequate lighting in your home to reduce risk of falls? No   ASSISTIVE DEVICES UTILIZED TO PREVENT FALLS:  Life alert? No  Use of a cane, walker or w/c? No  Grab bars in the bathroom? Yes  Shower chair or bench  in shower? No  Elevated toilet seat or a handicapped toilet?  no  TIMED UP AND GO:  Was the test performed? Yes .  Length of time to ambulate 10 feet: 10 sec.   Gait steady and fast without use of  assistive device  Cognitive Function:     6CIT Screen 03/16/2021  What Year? 0 points  What month? 0 points  What time? 0 points  Count back from 20 0 points  Months in reverse 0 points  Repeat phrase 0 points  Total Score 0    Immunizations Immunization History  Administered Date(s) Administered   Fluad Quad(high Dose 65+) 03/16/2020   Influenza, High Dose Seasonal PF 04/07/2018, 03/15/2019   Influenza,inj,Quad PF,6+ Mos 03/20/2015, 04/01/2016   Influenza-Unspecified 05/02/1946, 03/06/2012, 03/06/2014, 03/06/2018, 04/06/2018, 03/16/2019   Moderna Sars-Covid-2 Vaccination 07/18/2019, 08/16/2019, 03/20/2020   Pneumococcal Conjugate-13 06/18/2014   Pneumococcal Polysaccharide-23 03/15/2019   Td 11/10/2006   Zoster Recombinat (Shingrix) 04/07/2018, 06/19/2018   Zoster, Live 03/05/2014    TDAP status: Due, Education has been provided regarding the importance of this vaccine. Advised may receive this vaccine at local pharmacy or Health Dept. Aware to provide a copy of the vaccination record if obtained from local pharmacy or Health Dept. Verbalized acceptance and understanding.  Flu Vaccine status: Due, Education has been provided regarding the importance of this vaccine. Advised may receive this vaccine at local pharmacy or Health Dept. Aware to provide a copy of the vaccination record if obtained from local pharmacy or Health Dept. Verbalized acceptance and understanding.  Pneumococcal vaccine status: Completed during today's visit.  Covid-19 vaccine status: Information provided on how to obtain vaccines.   Qualifies for Shingles Vaccine? Yes   Zostavax completed Yes   Shingrix Completed?: Yes  Screening Tests Health Maintenance  Topic Date Due   TETANUS/TDAP  11/09/2016   COVID-19 Vaccine (4 - Booster for Moderna series) 06/12/2020   INFLUENZA VACCINE  01/04/2021   MAMMOGRAM  10/13/2021   COLONOSCOPY (Pts 45-5yrs Insurance coverage will need to be confirmed)   08/31/2022   DEXA SCAN  Completed   Hepatitis C Screening  Completed   Zoster Vaccines- Shingrix  Completed   HPV VACCINES  Aged Out    Health Maintenance  Health Maintenance Due  Topic Date Due   TETANUS/TDAP  11/09/2016   COVID-19 Vaccine (4 - Booster for Moderna series) 06/12/2020   INFLUENZA VACCINE  01/04/2021    Colorectal cancer screening: Type of screening: Colonoscopy. Completed 08/30/2012. Repeat every 10 years  Mammogram status: Completed 10/14/2019. Repeat every year  Bone Density status: Completed 12/04/2019. Results reflect: Bone density results: OSTEOPENIA. Repeat every 2 years.  Lung Cancer Screening: (Low Dose CT Chest recommended if Age 33-80 years, 30 pack-year currently smoking OR have quit w/in 15years.) does not qualify.   Additional Screening:  Hepatitis C Screening: does qualify; Completed 12/31/20  Vision Screening: Recommended annual ophthalmology exams for early detection of glaucoma and other disorders of the eye. Is the patient up to date with their annual eye exam?  No  Who is the provider or what is the name of the office in which the patient attends annual eye exams? Dr. Jorja Loa If pt is not established with a provider, would they like to be referred to a provider to establish care? No .   Dental Screening: Recommended annual dental exams for proper oral hygiene  Community Resource Referral / Chronic Care Management: CRR required this visit?  No   CCM required this visit?  No      Plan:     I have personally reviewed and noted the following in the patient's chart:   Medical and social history Use of alcohol, tobacco or illicit drugs  Current medications and supplements including opioid prescriptions. Patient is not currently taking opioid prescriptions. Functional ability and status Nutritional status Physical activity Advanced directives List of other physicians Hospitalizations, surgeries, and ER visits in previous 12  months Vitals Screenings to include cognitive, depression, and falls Referrals and appointments  In addition, I have reviewed and discussed with patient certain preventive protocols, quality metrics, and best practice recommendations. A written personalized care plan for preventive services as well as general preventive health recommendations were provided to patient.     Chriss Driver, LPN   [id number]   Nurse Notes: Pt states doing well. Will call to schedule eye exam, mammogram and dexa.

## 2021-03-16 NOTE — Patient Instructions (Signed)
Terri Douglas , Thank you for taking time to come for your Medicare Wellness Visit. I appreciate your ongoing commitment to your health goals. Please review the following plan we discussed and let me know if I can assist you in the future.   Screening recommendations/referrals: Colonoscopy: Done 08/30/2012 Repeat in 10 years  Mammogram: Done 10/14/2019 Repeat annually  Bone Density: Done 12/04/2019 Repeat every 2 years  Recommended yearly ophthalmology/optometry visit for glaucoma screening and checkup Recommended yearly dental visit for hygiene and checkup  Vaccinations: Influenza vaccine: Done 03/16/2020 Repeat annually  Pneumococcal vaccine: Done 06/18/2014 and 03/15/2019 Tdap vaccine: Due Repeat in 10 years  Shingles vaccine: Done 03/05/2014, 04/07/2018, 06/19/2018   Covid-19:[place]on 07/18/2019, 08/16/2019, 03/20/2020  Advanced directives: Please bring a copy of your health care power of attorney and living will to the office to be added to your chart at your convenience.   Conditions/risks identified: Aim for 30 minutes of exercise or walking each day, drink 6-8 glasses of water and eat lots of fruits and vegetables.   Next appointment: Follow up in one year for your annual wellness visit 2023.   Preventive Care 6 Years and Older, Female Preventive care refers to lifestyle choices and visits with your health care provider that can promote health and wellness. What does preventive care include? A yearly physical exam. This is also called an annual well check. Dental exams once or twice a year. Routine eye exams. Ask your health care provider how often you should have your eyes checked. Personal lifestyle choices, including: Daily care of your teeth and gums. Regular physical activity. Eating a healthy diet. Avoiding tobacco and drug use. Limiting alcohol use. Practicing safe sex. Taking low-dose aspirin every day. Taking vitamin and mineral supplements as recommended by your health  care provider. What happens during an annual well check? The services and screenings done by your health care provider during your annual well check will depend on your age, overall health, lifestyle risk factors, and family history of disease. Counseling  Your health care provider may ask you questions about your: Alcohol use. Tobacco use. Drug use. Emotional well-being. Home and relationship well-being. Sexual activity. Eating habits. History of falls. Memory and ability to understand (cognition). Work and work Statistician. Reproductive health. Screening  You may have the following tests or measurements: Height, weight, and BMI. Blood pressure. Lipid and cholesterol levels. These may be checked every 5 years, or more frequently if you are over 57 years old. Skin check. Lung cancer screening. You may have this screening every year starting at age 73 if you have a 30-pack-year history of smoking and currently smoke or have quit within the past 15 years. Fecal occult blood test (FOBT) of the stool. You may have this test every year starting at age 56. Flexible sigmoidoscopy or colonoscopy. You may have a sigmoidoscopy every 5 years or a colonoscopy every 10 years starting at age 75. Hepatitis C blood test. Hepatitis B blood test. Sexually transmitted disease (STD) testing. Diabetes screening. This is done by checking your blood sugar (glucose) after you have not eaten for a while (fasting). You may have this done every 1-3 years. Bone density scan. This is done to screen for osteoporosis. You may have this done starting at age 19. Mammogram. This may be done every 1-2 years. Talk to your health care provider about how often you should have regular mammograms. Talk with your health care provider about your test results, treatment options, and if necessary, the need for  more tests. Vaccines  Your health care provider may recommend certain vaccines, such as: Influenza vaccine. This is  recommended every year. Tetanus, diphtheria, and acellular pertussis (Tdap, Td) vaccine. You may need a Td booster every 10 years. Zoster vaccine. You may need this after age 82. Pneumococcal 13-valent conjugate (PCV13) vaccine. One dose is recommended after age 71. Pneumococcal polysaccharide (PPSV23) vaccine. One dose is recommended after age 54. Talk to your health care provider about which screenings and vaccines you need and how often you need them. This information is not intended to replace advice given to you by your health care provider. Make sure you discuss any questions you have with your health care provider. Document Released: 06/19/2015 Document Revised: 02/10/2016 Document Reviewed: 03/24/2015 Elsevier Interactive Patient Education  2017 [place] Prevention in the Home Falls can cause injuries. They can happen to people of all ages. There are many things you can do to make your home safe and to help prevent falls. What can I do on the outside of my home? Regularly fix the edges of walkways and driveways and fix any cracks. Remove anything that might make you trip as you walk through a door, such as a raised step or threshold. Trim any bushes or trees on the path to your home. Use bright outdoor lighting. Clear any walking paths of anything that might make someone trip, such as rocks or tools. Regularly check to see if handrails are loose or broken. Make sure that both sides of any steps have handrails. Any raised decks and porches should have guardrails on the edges. Have any leaves, snow, or ice cleared regularly. Use sand or salt on walking paths during winter. Clean up any spills in your garage right away. This includes oil or grease spills. What can I do in the bathroom? Use night lights. Install grab bars by the toilet and in the tub and shower. Do not use towel bars as grab bars. Use non-skid mats or decals in the tub or shower. If you need to sit down in  the shower, use a plastic, non-slip stool. Keep the floor dry. Clean up any water that spills on the floor as soon as it happens. Remove soap buildup in the tub or shower regularly. Attach bath mats securely with double-sided non-slip rug tape. Do not have throw rugs and other things on the floor that can make you trip. What can I do in the bedroom? Use night lights. Make sure that you have a light by your bed that is easy to reach. Do not use any sheets or blankets that are too big for your bed. They should not hang down onto the floor. Have a firm chair that has side arms. You can use this for support while you get dressed. Do not have throw rugs and other things on the floor that can make you trip. What can I do in the kitchen? Clean up any spills right away. Avoid walking on wet floors. Keep items that you use a lot in easy-to-reach places. If you need to reach something above you, use a strong step stool that has a grab bar. Keep electrical cords out of the way. Do not use floor polish or wax that makes floors slippery. If you must use wax, use non-skid floor wax. Do not have throw rugs and other things on the floor that can make you trip. What can I do with my stairs? Do not leave any items on the stairs. Make  sure that there are handrails on both sides of the stairs and use them. Fix handrails that are broken or loose. Make sure that handrails are as long as the stairways. Check any carpeting to make sure that it is firmly attached to the stairs. Fix any carpet that is loose or worn. Avoid having throw rugs at the top or bottom of the stairs. If you do have throw rugs, attach them to the floor with carpet tape. Make sure that you have a light switch at the top of the stairs and the bottom of the stairs. If you do not have them, ask someone to add them for you. What else can I do to help prevent falls? Wear shoes that: Do not have high heels. Have rubber bottoms. Are comfortable  and fit you well. Are closed at the toe. Do not wear sandals. If you use a stepladder: Make sure that it is fully opened. Do not climb a closed stepladder. Make sure that both sides of the stepladder are locked into place. Ask someone to hold it for you, if possible. Clearly mark and make sure that you can see: Any grab bars or handrails. First and last steps. Where the edge of each step is. Use tools that help you move around (mobility aids) if they are needed. These include: Canes. Walkers. Scooters. Crutches. Turn on the lights when you go into a dark area. Replace any light bulbs as soon as they burn out. Set up your furniture so you have a clear path. Avoid moving your furniture around. If any of your floors are uneven, fix them. If there are any pets around you, be aware of where they are. Review your medicines with your doctor. Some medicines can make you feel dizzy. This can increase your chance of falling. Ask your doctor what other things that you can do to help prevent falls. This information is not intended to replace advice given to you by your health care provider. Make sure you discuss any questions you have with your health care provider. Document Released: 03/19/2009 Document Revised: 10/29/2015 Document Reviewed: 06/27/2014 Elsevier Interactive Patient Education  2017 [employer].

## 2021-03-23 DIAGNOSIS — D2261 Melanocytic nevi of right upper limb, including shoulder: Secondary | ICD-10-CM | POA: Diagnosis not present

## 2021-03-23 DIAGNOSIS — Z85828 Personal history of other malignant neoplasm of skin: Secondary | ICD-10-CM | POA: Diagnosis not present

## 2021-03-23 DIAGNOSIS — I872 Venous insufficiency (chronic) (peripheral): Secondary | ICD-10-CM | POA: Diagnosis not present

## 2021-03-23 DIAGNOSIS — D224 Melanocytic nevi of scalp and neck: Secondary | ICD-10-CM | POA: Diagnosis not present

## 2021-03-23 DIAGNOSIS — L650 Telogen effluvium: Secondary | ICD-10-CM | POA: Diagnosis not present

## 2021-03-23 DIAGNOSIS — L578 Other skin changes due to chronic exposure to nonionizing radiation: Secondary | ICD-10-CM | POA: Diagnosis not present

## 2021-03-23 DIAGNOSIS — Z23 Encounter for immunization: Secondary | ICD-10-CM | POA: Diagnosis not present

## 2021-03-23 DIAGNOSIS — L570 Actinic keratosis: Secondary | ICD-10-CM | POA: Diagnosis not present

## 2021-03-23 DIAGNOSIS — L814 Other melanin hyperpigmentation: Secondary | ICD-10-CM | POA: Diagnosis not present

## 2021-03-23 DIAGNOSIS — D2271 Melanocytic nevi of right lower limb, including hip: Secondary | ICD-10-CM | POA: Diagnosis not present

## 2021-04-05 ENCOUNTER — Telehealth: Payer: Self-pay | Admitting: Family Medicine

## 2021-04-05 NOTE — Telephone Encounter (Signed)
Pt contacted office. Pt verbalized understanding

## 2021-04-05 NOTE — Telephone Encounter (Signed)
Patient wants to get the pneumonia vaccine at [facility] but needs to know which vaccine was administered.   [employer]  [phone]

## 2021-04-05 NOTE — Telephone Encounter (Signed)
According to the most recent data she is up-to-date pneumonia immunizations. You may give her the dates in her vaccines  The CDC says if you got the Prevnar 13 and pneumococcal 23 essentially 1 is good to go  The newer pneumonia vaccine is called pneumococcal 20 and is intended for individuals either have a incomplete pneumonia vaccine or have never had one  If she desires to get the newer pneumococcal 20 vaccine she can get that through [place] but she ought to check with them to make sure it is covered by her insurance

## 2021-04-06 DIAGNOSIS — Z23 Encounter for immunization: Secondary | ICD-10-CM | POA: Diagnosis not present

## 2021-05-07 ENCOUNTER — Ambulatory Visit: Payer: Medicare Other | Admitting: Family Medicine

## 2021-05-12 IMAGING — CT CT SHOULDER*L* W/O CM
1 series · 12 of 14 positions shown, 15 images · non-contrast
Comparison: None.

CLINICAL DATA: Left shoulder replacement in 7373. Having pain for 2
months.

EXAM:
CT OF THE UPPER LEFT EXTREMITY WITHOUT CONTRAST
TECHNIQUE: Multidetector CT imaging of the upper left extremity was performed
according to the standard protocol.

[Series 3: soft tissue · axial · 0.51mm/px · z∈[-225,-57]mm · 12 of 100 slices shown, 15 images]
[im 8/100  soft-tissue]
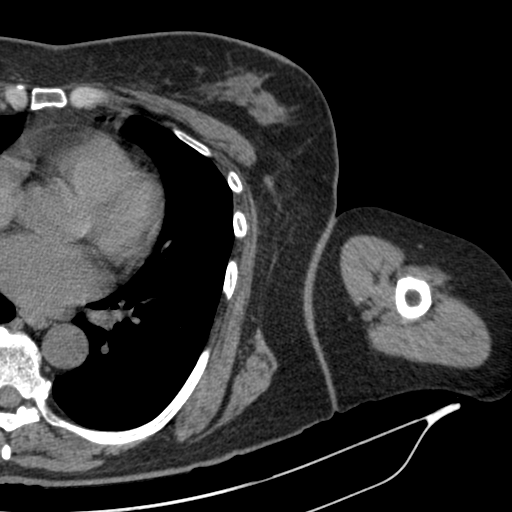
[im 8/100  bone]
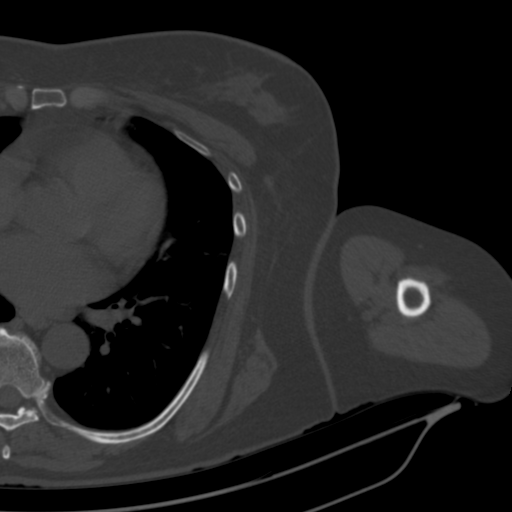
[im 16/100  bone]
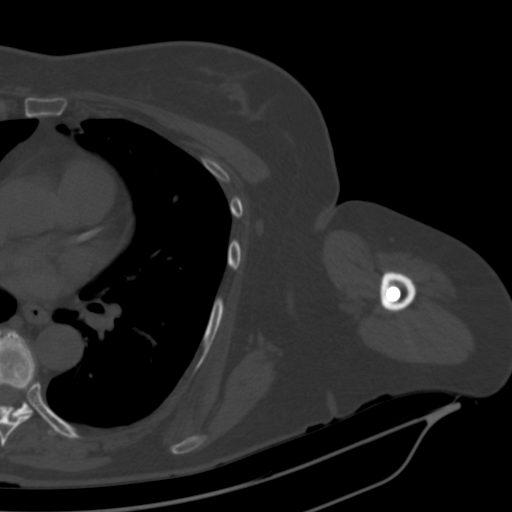
[im 23/100  bone]
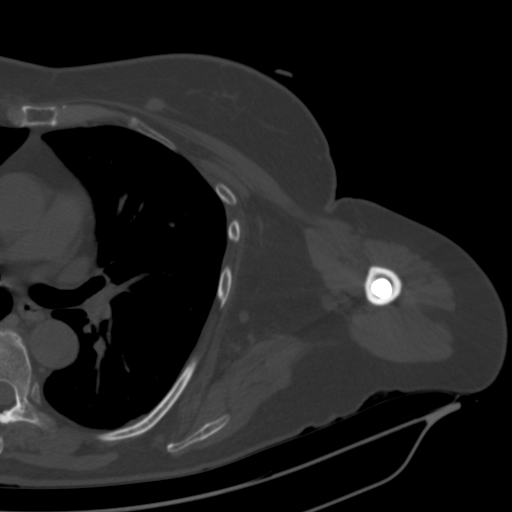
[im 31/100  bone]
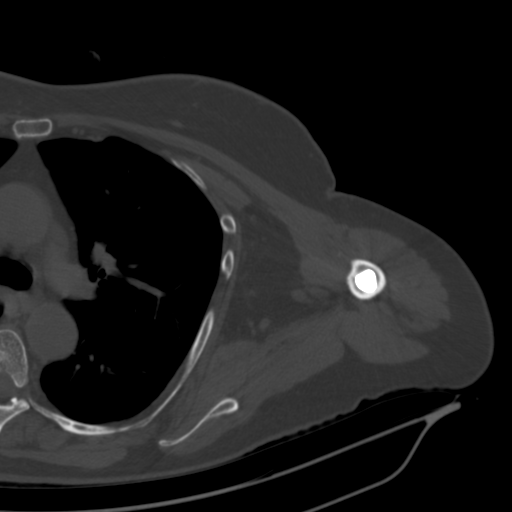
[im 39/100  soft-tissue]
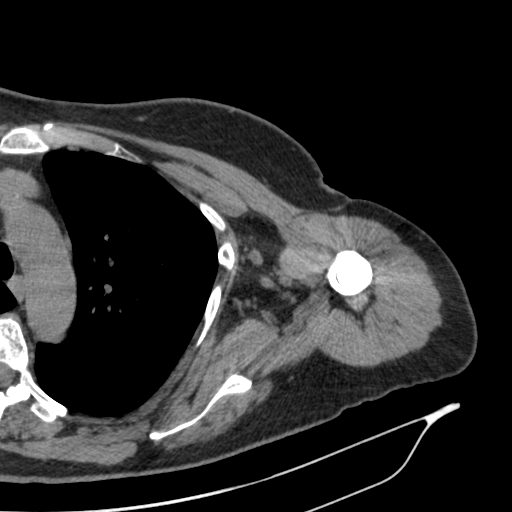
[im 39/100  bone]
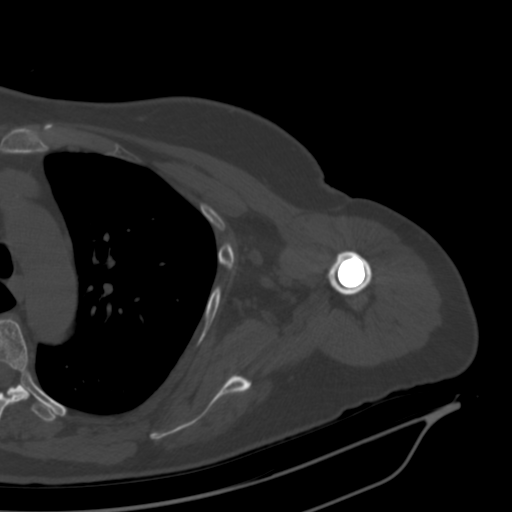
[im 46/100  bone]
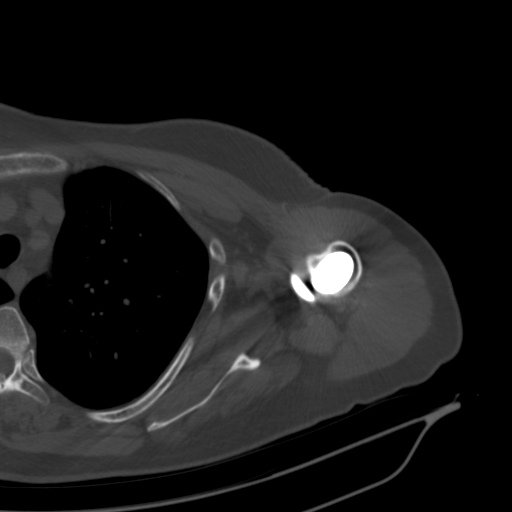
[im 54/100  bone]
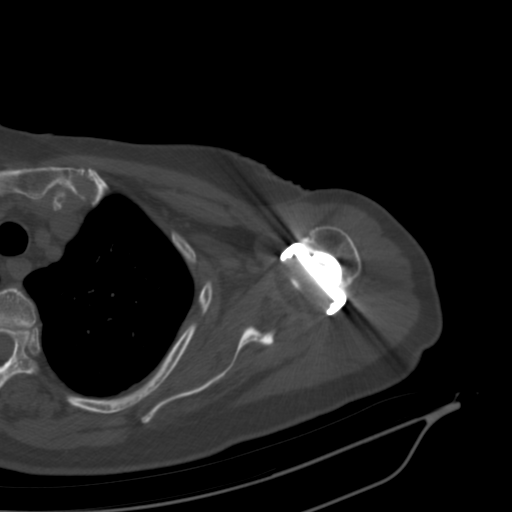
[im 61/100  bone]
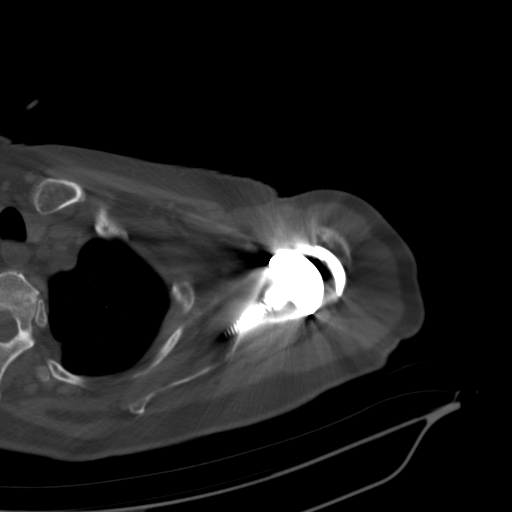
[im 69/100  soft-tissue]
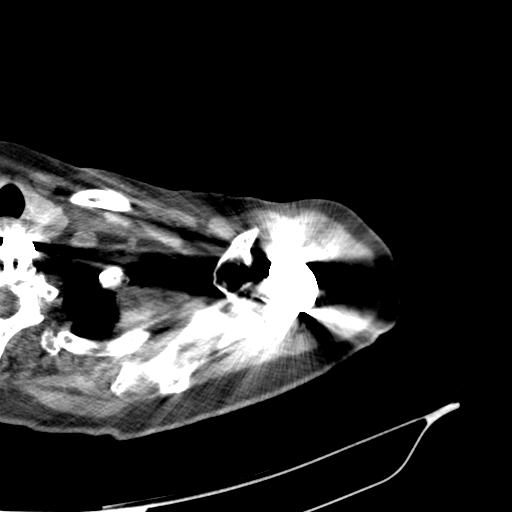
[im 69/100  bone]
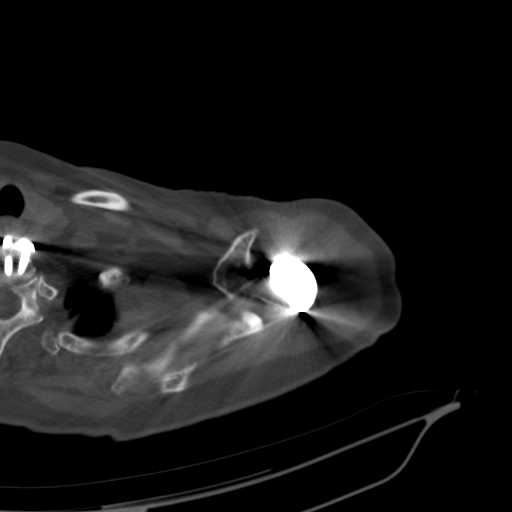
[im 77/100  bone]
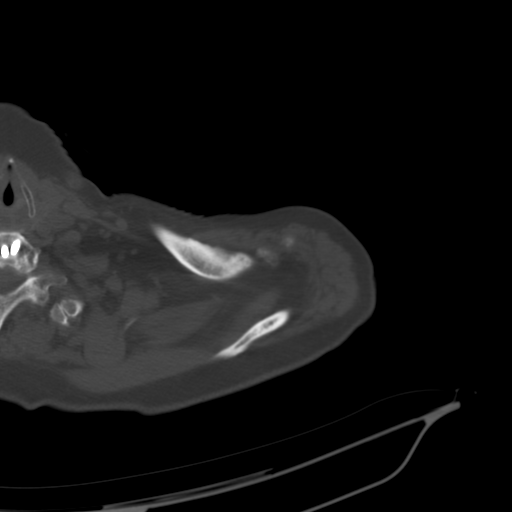
[im 84/100  bone]
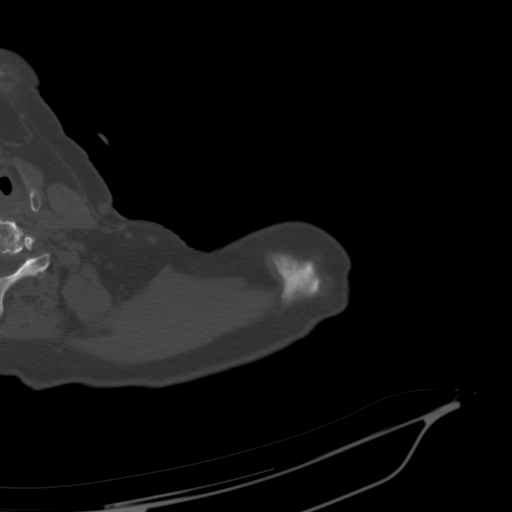
[im 92/100  bone]
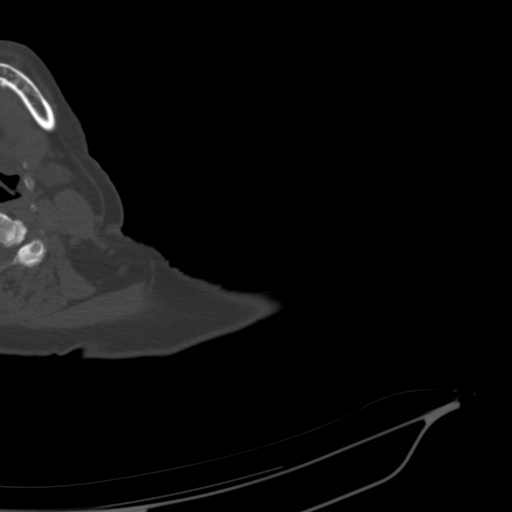

[12 of 14 positions shown; findings below may reference images not displayed]

FINDINGS: Bones/Joint/Cartilage

Left shoulder reverse hemiarthroplasty. No hardware failure or
complication. No periarticular fluid collection or osteolysis.

Nondisplaced healing fractures of the coracoid process. No other
fracture or dislocation. Normal alignment. No joint effusion.

Ligaments

Ligaments are suboptimally evaluated by CT.

Muscles and Tendons
Muscles are normal.  No muscle atrophy.

Soft tissue
No fluid collection or hematoma. No soft tissue mass. Visualized
left lung is clear.
IMPRESSION: 1. Left shoulder reverse hemiarthroplasty without hardware failure
or complication.
2. Nondisplaced healing fractures of the coracoid process.

## 2021-05-21 ENCOUNTER — Other Ambulatory Visit: Payer: Self-pay | Admitting: Family Medicine

## 2021-05-23 NOTE — Progress Notes (Signed)
Office Visit Note  Patient: Terri Douglas             Date of Birth: 02-Jun-1946           MRN: [id number]             PCP: Kathyrn Drown, MD Referring: Kathyrn Drown, MD Visit Date: 05/24/2021   Subjective:  Follow-up (Not doing good, right hip pain, walking with a limp)   History of Present Illness: Terri Douglas is a 75 y.o. 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 She is experiencing worsening right hip pain bothering her both resting or lying flat but now also with increased pain while walking and feels like her gait is catching or unsteady and is more painful. She has not noticed any recurrence of elbow or knee swelling.  Previous HPI 02/09/21 Terri Douglas is a 75 y.o. 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  Since stopping that she has continued ongoing joint pain at multiple sites but feels it is manageable on the Celebrex.  Bili increased problem is having some pain at the lateral right hip and this is particularly bothersome when lying on her side in bed and causing sleep difficulty.  Pain is mostly with direct pressure or when getting up not particularly worsened with weightbearing.    Previous HPI 12/28/20 Terri Douglas is a 75 y.o. female here for joint pain of multiple sites ongoing since a hospitalization last month.  Currently prescribed allopurinol 100 mg daily. She has a more chronic history of osteoarthritis in multiple areas with chronic hip pain.  She has a history of gout with previous attacks in the left and right great toe on treatment with allopurinol 100 mg daily.  She denies any history of previous gout attack outside of her feet. The symptoms have been and there typical state for some time until she became ill with flank pain and diarrhea symptoms  seen at the hospital with work-up indicating uncomplicated yersinia enterocolitis enteritis.  She was treated with colchicine for suspected gout flare but stopped due to the diarrhea and treated with prednisone with improvement of symptoms.  She was most recently prescribed oral steroids about 3 weeks ago and primary care follow-up this improves her symptoms though she notices return after stopping it. Currently she has inflammation of multiple sites particularly in the right thumb and index finger and the right knee and in the left foot.  The right knee is her worst problem she reports swelling that started in the front and has now also involve the back of the knee limiting her ability to flex this and cannot assume a normal seated position.   Review of Systems  Constitutional:  Positive for fatigue.  HENT:  Positive for mouth dryness.   Eyes:  Positive for dryness.  Respiratory:  Positive for shortness of breath.   Cardiovascular:  Positive for swelling in legs/feet.  Gastrointestinal:  Negative for constipation.  Endocrine: Positive for heat intolerance and excessive thirst.  Genitourinary:  Negative for difficulty urinating.  Musculoskeletal:  Positive for joint pain, gait problem, joint pain and morning stiffness.  Skin:  Negative for rash.  Allergic/Immunologic: Negative for susceptible to infections.  Neurological:  Positive for weakness.  Hematological:  Positive for bruising/bleeding tendency.  Psychiatric/Behavioral:  Positive for  sleep disturbance.    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   Insomnia 03/15/2019   Abdominal pain, epigastric 05/09/2016   Belching 05/09/2016   Hyperlipidemia 06/18/2014   Hyperglycemia 06/18/2014   Gout 06/18/2014   Osteopenia 10/28/2013   Right foot pain 01/31/2013   Hypertension 03/29/2011   High cholesterol 03/29/2011    Past Medical History:  Diagnosis Date   Arthritis    Complication of anesthesia    Gall stones    GERD (gastroesophageal reflux disease)    History of cardiac catheterization 2005   Minor elevation in cardiac enzymes however no significant obstructive CAD   Hyperlipidemia    Hypertension    Insomnia    PONV (postoperative nausea and vomiting)    Prediabetes     Family History  Problem Relation Age of Onset   Bone cancer Mother    Cancer Mother    Prostate cancer Father    Cancer Father    Heart disease Father    Prostate cancer Brother    Heart disease Brother    Early death Son    Healthy Son    Healthy Daughter    Anesthesia problems Neg Hx    Hypotension Neg Hx    Malignant hyperthermia Neg Hx    Pseudochol deficiency Neg Hx    Past Surgical History:  Procedure Laterality Date   A-FLUTTER ABLATION N/A 08/01/2017   Procedure: A-FLUTTER ABLATION;  Surgeon: Thompson Grayer, MD;  Location: [facility];  Service: Cardiovascular;  Laterality: N/A;   ABDOMINAL HYSTERECTOMY     BACK SURGERY  1992   CARDIAC CATHETERIZATION     BACK IN 2004  SHE THINKS IT CAME BACK 'NORMAL'   CHOLECYSTECTOMY N/A 06/24/2015   Procedure: LAPAROSCOPIC CHOLECYSTECTOMY;  Surgeon: Mickeal Skinner, MD;  Location: [facility];  Service: General;  Laterality: N/A;   COLONOSCOPY N/A 08/30/2012   Procedure: COLONOSCOPY;  Surgeon: Rogene Houston, MD;  [facility];  Service: Endoscopy;  Laterality: N/A;  [place]     ESOPHAGOGASTRODUODENOSCOPY  04/15/2011   Procedure:  ESOPHAGOGASTRODUODENOSCOPY (EGD);  Surgeon: Rogene Houston, MD;  [facility];  Service: Endoscopy;  Laterality: N/A;  11:30   ESOPHAGOGASTRODUODENOSCOPY N/A 05/27/2016   Procedure: ESOPHAGOGASTRODUODENOSCOPY (EGD);  Surgeon: Rogene Houston, MD;  [facility];  Service: Endoscopy;  Laterality: N/A;  11:15   EYE SURGERY     cataract removal   NECK SURGERY  12/13/2018   REVERSE SHOULDER ARTHROPLASTY Left 04/03/2019   Procedure: REVERSE SHOULDER ARTHROPLASTY;  Surgeon: Hiram Gash, MD;  Location[facility];  Service: Orthopedics;  Laterality: Left;   VAGINAL HYSTERECTOMY     Social History   Social History Narrative   Not on file   Immunization History  Administered Date(s) Administered   Fluad Quad(high Dose 65+) 03/16/2020   Influenza, High Dose Seasonal PF 04/07/2018, 03/15/2019   Influenza,inj,Quad PF,6+ Mos 03/20/2015, 04/01/2016   Influenza-Unspecified 01[birth year]17/47,  03/06/2012, 03/06/2014, 03/06/2018, 04/06/2018, 03/16/2019   Moderna Sars-Covid-2 Vaccination 07/18/2019, 08/16/2019, 03/20/2020   Pneumococcal Conjugate-13 06/18/2014   Pneumococcal Polysaccharide-23 03/15/2019   Td 11/10/2006   Zoster Recombinat (Shingrix) 04/07/2018, 06/19/2018   Zoster, Live 03/05/2014     Objective: Vital Signs: BP 102/62 (BP Location: Left Arm, Patient Position: Sitting, Cuff Size: Normal)    Pulse 70    Resp 15    Ht 5' 1.5" (1.562 m)    Wt 135 lb 12.8 oz (61.6 kg)    BMI 25.24 kg/m    Physical Exam Eyes:     Conjunctiva/sclera: Conjunctivae normal.  Cardiovascular:     Rate and Rhythm: Normal rate. Rhythm irregular.  Pulmonary:     Effort: Pulmonary effort is normal.     Breath sounds: Normal breath sounds.  Musculoskeletal:     Right lower leg: No edema.     Left lower leg: No edema.  Skin:    General: Skin is warm and dry.     Findings: No rash.     Comments: Bruise on dorsum of left forearm  Neurological:     General: No focal deficit present.     Mental  Status: She is alert.  Psychiatric:     Comments: Reports very high stress at home     Musculoskeletal Exam:  Neck full ROM no tenderness Shoulders full ROM no tenderness or swelling Elbows full ROM no tenderness or swelling Wrists full ROM no tenderness or swelling Fingers full ROM no tenderness or swelling Right hip decreased FADIR and FABER ROM, tenderness to pressure anteriorly and posteriorly at the pelvis not deep in groin, left hip better ROM and nontender Knees full ROM no tenderness or swelling Ankles full ROM no tenderness or swelling   Investigation: No additional findings.  Imaging: No results found.  Recent Labs: Lab Results  Component Value Date   WBC 6.4 01/28/2021   HGB 12.3 01/28/2021   PLT 461 (H) 01/28/2021   NA 142 01/28/2021   K 4.4 01/28/2021   CL 100 01/28/2021   CO2 31 01/28/2021   GLUCOSE 81 01/28/2021   BUN 14 01/28/2021   CREATININE 0.93 01/28/2021   BILITOT 0.3 01/28/2021   ALKPHOS 93 11/20/2020   AST 16 01/28/2021   ALT 11 01/28/2021   PROT 6.5 01/28/2021   ALBUMIN 2.6 (L) 11/21/2020   CALCIUM 9.3 01/28/2021   GFRAA 65 05/21/2020   QFTBGOLDPLUS NEGATIVE 12/31/2020    Speciality Comments: No specialty comments available.  Procedures:  No procedures performed Allergies: Ibuprofen and Phenobarbital   Assessment / Plan:     Visit Diagnoses: Reactive arthritis of multiple sites ([place])  Inflammatory arthritis appears improved today no effusions, no erythema, and no painful joints besides the right hip today. I think she can try tapering or stopping the celebrex unless needing this for hip pain.  Pain in right hip Primary osteoarthritis of right hip - Plan: MR HIP RIGHT WO CONTRAST  Main complaint today, xray with osteoarthritis she has taken NSAIDs for most of 6 months now and previous physical therapy for weeks earlier this year. Pain at rest and pain with use and gait worsening. Will order MRI for further evaluation whether  orthopedic follow up for procedure intervention vs ongoing conservative treatment.  Orders: Orders Placed This Encounter  Procedures   MR HIP RIGHT WO CONTRAST   No orders of the defined types were placed in this encounter.    Follow-Up Instructions: No follow-ups on file.   Harrell Gave  Cassie Freer, MD  Note - This record has been created using Editor, commissioning.  Chart creation errors have been sought, but may not always  have been located. Such creation errors do not reflect on  the standard of medical care.

## 2021-05-24 ENCOUNTER — Ambulatory Visit (INDEPENDENT_AMBULATORY_CARE_PROVIDER_SITE_OTHER): Payer: Medicare Other | Admitting: Internal Medicine

## 2021-05-24 ENCOUNTER — Encounter: Payer: Self-pay | Admitting: Internal Medicine

## 2021-05-24 ENCOUNTER — Other Ambulatory Visit: Payer: Self-pay

## 2021-05-24 VITALS — BP 102/62 | HR 70 | Resp 15 | Ht 61.5 in | Wt 135.8 lb

## 2021-05-24 DIAGNOSIS — M1611 Unilateral primary osteoarthritis, right hip: Secondary | ICD-10-CM | POA: Diagnosis not present

## 2021-05-24 DIAGNOSIS — M25551 Pain in right hip: Secondary | ICD-10-CM | POA: Diagnosis not present

## 2021-05-24 DIAGNOSIS — M0239 Reiter's disease, multiple sites: Secondary | ICD-10-CM

## 2021-05-24 NOTE — Patient Instructions (Addendum)
I recommend contacting your cardiology office about irregular heart rhythm and palpitations may be related to atrial fibrillation.  I am ordering an MRI of the right hip to better evaluate this joint and whether the osteoarthritis needs to be considered for surgery.  I do not see evidence of the inflammatory arthritis in your other joints any more. You can try stopping the celebrex medication if is not helping your hip very much.

## 2021-05-25 ENCOUNTER — Telehealth: Payer: Self-pay

## 2021-05-25 NOTE — Telephone Encounter (Signed)
Patient called stating she scheduled her MRI for Friday, 05/28/21 at 7:30 am.  Patient states she is claustrophobic and requested a prescription be sent to the [facility].

## 2021-05-26 MED ORDER — LORAZEPAM 1 MG PO TABS: ORAL_TABLET | ORAL | 0 refills | Status: DC

## 2021-05-26 NOTE — Telephone Encounter (Signed)
Patient advised Rx sent for ativan 1 mg 2 tablets 1 to take 1-2 hours in advance 1 as needed right before within 30 minutes of study. Recommend for safety she needs a driver to return home after taking these due to sedation and should not take her usual prn xanax within 8 hours of these. Patient expressed understanding.

## 2021-05-26 NOTE — Telephone Encounter (Signed)
Rx sent for ativan 1 mg 2 tablets 1 to take 1-2 hours in advance 1 as needed right before within 30 minutes of study. Recommend for safety she needs a driver to return home after taking these due to sedation and should not take her usual prn xanax within 8 hours of these.

## 2021-05-28 ENCOUNTER — Other Ambulatory Visit: Payer: Self-pay

## 2021-05-28 ENCOUNTER — Ambulatory Visit
Admission: RE | Admit: 2021-05-28 | Discharge: 2021-05-28 | Disposition: A | Discharge status: Home / Self Care | Payer: Medicare Other | Source: Ambulatory Visit | Attending: Internal Medicine | Admitting: Internal Medicine

## 2021-05-28 DIAGNOSIS — M1611 Unilateral primary osteoarthritis, right hip: Secondary | ICD-10-CM

## 2021-05-28 DIAGNOSIS — M25551 Pain in right hip: Secondary | ICD-10-CM

## 2021-06-02 ENCOUNTER — Ambulatory Visit: Payer: Medicare Other | Admitting: Internal Medicine

## 2021-06-03 ENCOUNTER — Other Ambulatory Visit: Payer: Self-pay

## 2021-06-03 ENCOUNTER — Encounter: Payer: Self-pay | Admitting: Cardiology

## 2021-06-03 ENCOUNTER — Ambulatory Visit (INDEPENDENT_AMBULATORY_CARE_PROVIDER_SITE_OTHER): Payer: Medicare Other | Admitting: Cardiology

## 2021-06-03 VITALS — BP 132/72 | HR 64 | Ht 61.5 in | Wt 137.0 lb

## 2021-06-03 DIAGNOSIS — R002 Palpitations: Secondary | ICD-10-CM | POA: Diagnosis not present

## 2021-06-03 DIAGNOSIS — I10 Essential (primary) hypertension: Secondary | ICD-10-CM | POA: Diagnosis not present

## 2021-06-03 DIAGNOSIS — I483 Typical atrial flutter: Secondary | ICD-10-CM

## 2021-06-03 NOTE — Progress Notes (Signed)
Electrophysiology Office Note:    Date:  06/03/2021   ID:  SHARA HARTIS, DOB [birth year], MRN [id number]  PCP:  Kathyrn Drown, MD  [facility] Cardiologist:  None  [facility] Electrophysiologist:  Vickie Epley, MD   Referring MD: Kathyrn Drown, MD   Chief Complaint: Palpitations  History of Present Illness:    Terri Douglas is a 75 y.o. female who presents for an evaluation of palpitations at the request of Dr. Wolfgang Phoenix. Their medical history includes atrial flutter post ablation in 2019, hypertension, hyperlipidemia, GERD.  The patient was previously followed by Dr. Rayann Heman.  She did well for quite some time after her atrial flutter ablation.  Her anticoagulation was stopped given there is no evidence of recurrence or atrial fibrillation.  About 3 weeks ago she began experiencing palpitations.  There were no sustained arrhythmias.  No clear triggers.  Did not feel like her prior arrhythmia.  She is also experiencing a significant amount of stress at home.  Her husband is showing signs of possible dementia.  She is planning to discuss this with her husband's children and her primary care physician for guidance on how to get him the appropriate care.     Past Medical History:  Diagnosis Date   Arthritis    Complication of anesthesia    Gall stones    GERD (gastroesophageal reflux disease)    History of cardiac catheterization 2005   Minor elevation in cardiac enzymes however no significant obstructive CAD   Hyperlipidemia    Hypertension    Insomnia    PONV (postoperative nausea and vomiting)    Prediabetes     Past Surgical History:  Procedure Laterality Date   A-FLUTTER ABLATION N/A 08/01/2017   Procedure: A-FLUTTER ABLATION;  Surgeon: Thompson Grayer, MD;  Location: [facility];  Service: Cardiovascular;  Laterality: N/A;   ABDOMINAL HYSTERECTOMY     BACK SURGERY  1992   CARDIAC CATHETERIZATION     BACK IN 2004  SHE THINKS IT CAME BACK 'NORMAL'    CHOLECYSTECTOMY N/A 06/24/2015   Procedure: LAPAROSCOPIC CHOLECYSTECTOMY;  Surgeon: Mickeal Skinner, MD;  Location: [place];  Service: General;  Laterality: N/A;   COLONOSCOPY N/A 08/30/2012   Procedure: COLONOSCOPY;  Surgeon: Rogene Houston, MD;  [facility];  Service: Endoscopy;  Laterality: N/A;  [place]     ESOPHAGOGASTRODUODENOSCOPY  04/15/2011   Procedure: ESOPHAGOGASTRODUODENOSCOPY (EGD);  Surgeon: Rogene Houston, MD;  [facility];  Service: Endoscopy;  Laterality: N/A;  11:30   ESOPHAGOGASTRODUODENOSCOPY N/A 05/27/2016   Procedure: ESOPHAGOGASTRODUODENOSCOPY (EGD);  Surgeon: Rogene Houston, MD;  [facility];  Service: Endoscopy;  Laterality: N/A;  11:15   EYE SURGERY     cataract removal   NECK SURGERY  12/13/2018   REVERSE SHOULDER ARTHROPLASTY Left 04/03/2019   Procedure: REVERSE SHOULDER ARTHROPLASTY;  Surgeon: Hiram Gash, MD;  [facility];  Service: Orthopedics;  Laterality: Left;   VAGINAL HYSTERECTOMY      Current Medications: Current Meds  Medication Sig   allopurinol (ZYLOPRIM) 100 MG tablet Take 1 tablet (100 mg total) by mouth daily.   ALPRAZolam (XANAX) 1 MG tablet TAKE ONE TABLET BY MOUTH AT BEDTIME AS NEEDED   Biotin 5000 MCG TABS Take 5,000 mcg by mouth daily.   celecoxib (CELEBREX) 100 MG capsule Take 1 capsule (100 mg total) by mouth 2 (two) times daily.   cyclobenzaprine (  FLEXERIL) 10 MG tablet Take 1 tablet (10 mg total) by mouth at bedtime as needed for muscle spasms.   esomeprazole (NEXIUM) 40 MG capsule Take 1 capsule (40 mg total) by mouth daily with breakfast.   estradiol (ESTRACE) 2 MG tablet Take 2 mg by mouth daily.    finasteride (PROSCAR) 5 MG tablet Take 2.5 mg by mouth daily.    loperamide (IMODIUM A-D) 2 MG tablet Take 1 tablet (2 mg total) by mouth 4 (four) times daily as needed for diarrhea or loose stools.   LORazepam (ATIVAN) 1 MG tablet Take 1 tablet by mouth 1-2  hours before scheduled study, 2nd tablet as needed before starting study   Polyethyl Glycol-Propyl Glycol (LUBRICANT EYE DROPS) 0.4-0.3 % SOLN Place 1-2 drops into both eyes daily as needed (dry/irritated eyes.).   potassium chloride (KLOR-CON) 10 MEQ tablet Take 2 tablet in the AM and 2 tablets in the PM   pravastatin (PRAVACHOL) 80 MG tablet TAKE ONE (1) TABLET BY MOUTH EVERY DAY   Probiotic Product (PHILLIPS COLON HEALTH PO) Take 1 capsule by mouth daily as needed (irritable bowel).   sertraline (ZOLOFT) 50 MG tablet TAKE ONE (1) TABLET BY MOUTH EVERY DAY   torsemide (DEMADEX) 20 MG tablet Take 1 tablet (20 mg total) by mouth every morning.     Allergies:   Ibuprofen and Phenobarbital   Social History   Socioeconomic History   Marital status: Married    Spouse name: Not on file   Number of children: Not on file   Years of education: Not on file   Highest education level: Not on file  Occupational History   Not on file  Tobacco Use   Smoking status: Never   Smokeless tobacco: Never  Vaping Use   Vaping Use: Never used  Substance and Sexual Activity   Alcohol use: No   Drug use: No   Sexual activity: Never  Other Topics Concern   Not on file  Social History Narrative   Not on file   Social Determinants of Health   Financial Resource Strain: Low Risk    Difficulty of Paying Living Expenses: Not hard at all  Food Insecurity: No Food Insecurity   Worried About Charity fundraiser in the Last Year: Never true   [place] in the Last Year: Never true  Transportation Needs: No Transportation Needs   Lack of Transportation (Medical): No   Lack of Transportation (Non-Medical): No  Physical Activity: Insufficiently Active   Days of Exercise per Week: 3 days   Minutes of Exercise per Session: 30 min  Stress: No Stress Concern Present   Feeling of Stress : Not at all  Social Connections: Socially Integrated   Frequency of Communication with Friends and Family: More than  three times a week   Frequency of Social Gatherings with Friends and Family: More than three times a week   Attends Religious Services: More than 4 times per year   Active Member of [employer] or Organizations: Yes   Attends Music therapist: More than 4 times per year   Marital Status: Married     Family History: The patient's family history includes Bone cancer in her mother; Cancer in her father and mother; Early death in her son; Healthy in her daughter and son; Heart disease in her brother and father; Prostate cancer in her brother and father. There is no history of Anesthesia problems, Hypotension, Malignant hyperthermia, or Pseudochol deficiency.  ROS:  Please see the history of present illness.    All other systems reviewed and are negative.  EKGs/Labs/Other Studies Reviewed:    The following studies were reviewed today:  Prior ablation records   EKG:  The ekg ordered today demonstrates sinus rhythm.  No ectopy.   Recent Labs: 11/20/2020: Magnesium 1.5 01/28/2021: ALT 11; BUN 14; Creat 0.93; Hemoglobin 12.3; Platelets 461; Potassium 4.4; Sodium 142  Recent Lipid Panel    Component Value Date/Time   CHOL 166 05/21/2020 0812   TRIG 141 05/21/2020 0812   HDL 51 05/21/2020 0812   CHOLHDL 3.3 05/21/2020 0812   CHOLHDL 3.8 06/18/2014 0712   VLDL 47 (H) 06/18/2014 0712   LDLCALC 90 05/21/2020 0812    Physical Exam:    VS:  BP 132/72    Pulse 64    Ht 5' 1.5" (1.562 m)    Wt 137 lb (62.1 kg)    SpO2 96%    BMI 25.47 kg/m     Wt Readings from Last 3 Encounters:  06/03/21 137 lb (62.1 kg)  05/24/21 135 lb 12.8 oz (61.6 kg)  03/16/21 137 lb 9.6 oz (62.4 kg)     GEN:  Well nourished, well developed in no acute distress HEENT: Normal NECK: No JVD; No carotid bruits LYMPHATICS: No lymphadenopathy CARDIAC: RRR, no murmurs, rubs, gallops RESPIRATORY:  Clear to auscultation without rales, wheezing or rhonchi  ABDOMEN: Soft, non-tender,  non-distended MUSCULOSKELETAL:  No edema; No deformity  SKIN: Warm and dry NEUROLOGIC:  Alert and oriented x 3 PSYCHIATRIC:  Normal affect       ASSESSMENT:    1. Palpitations   2. Primary hypertension   3. Typical atrial flutter (HCC)    PLAN:    In order of problems listed above:  #Palpitations Unclear cause at this time.  I would like to start with a 14-day ZIO monitor to get a better understanding what is causing her symptoms.  My concern is that her symptoms could represent atrial fibrillation given her history of atrial flutter.  I will plan to see her back in about 6 to 8 weeks to review the results of the monitor and to discuss neck steps.  #Hypertension Controlled.  Continue current regimen.  Recommend she check her blood pressure 1-2 times per week and bring these blood pressure recordings to her primary care physician.  #Typical atrial flutter Post successful ablation in 2019 by Dr. Rayann Heman.  No recurrence of atrial flutter.  Is currently not taking an anticoagulant.   Follow-up 6 to 8 weeks   Total time spent with patient today 45 minutes. This includes reviewing records, evaluating the patient and coordinating care.  Medication Adjustments/Labs and Tests Ordered: Current medicines are reviewed at length with the patient today.  Concerns regarding medicines are outlined above.  Orders Placed This Encounter  Procedures   LONG TERM MONITOR (3-14 DAYS)   EKG 12-Lead   No orders of the defined types were placed in this encounter.    Signed, Hilton Cork. Quentin Ore, MD, [facility], [facility] 06/03/2021 8:00 PM    Electrophysiology [facility]

## 2021-06-03 NOTE — Patient Instructions (Addendum)
Medication Instructions:  Your physician recommends that you continue on your current medications as directed. Please refer to the Current Medication list given to you today. *If you need a refill on your cardiac medications before your next appointment, please call your pharmacy*  Lab Work: None ordered. If you have labs (blood work) drawn today and your tests are completely normal, you will receive your results only by: [place] (if you have MyChart) OR A paper copy in the mail If you have any lab test that is abnormal or we need to change your treatment, we will call you to review the results.  Testing/Procedures: Your physician has recommended that you wear a holter monitor. Holter monitors are medical devices that record the hearts electrical activity. Doctors most often use these monitors to diagnose arrhythmias. Arrhythmias are problems with the speed or rhythm of the heartbeat. The monitor is a small, portable device. You can wear one while you do your normal daily activities. This is usually used to diagnose what is causing palpitations/syncope (passing out).  You will wear a 14 day ZIO monitor  Follow-Up: At [facility], you and your health needs are our priority.  As part of our continuing mission to provide you with exceptional heart care, we have created designated Provider Care Teams.  These Care Teams include your primary Cardiologist (physician) and Advanced Practice Providers (APPs -  Physician Assistants and Nurse Practitioners) who all work together to provide you with the care you need, when you need it.  Your next appointment:   Your physician wants you to follow-up based on results of your heart monitor: 6-8 weeks with Dr. Quentin Ore  Your physician has recommended that you wear a Zio monitor.   This monitor is a medical device that records the hearts electrical activity. Doctors most often use these monitors to diagnose arrhythmias. Arrhythmias are problems  with the speed or rhythm of the heartbeat. The monitor is a small device applied to your chest. You can wear one while you do your normal daily activities. While wearing this monitor if you have any symptoms to push the button and record what you felt. Once you have worn this monitor for the period of time provider prescribed (Usually 14 days), you will return the monitor device in the postage paid box. Once it is returned they will download the data collected and provide [place] with a report which the provider will then review and we will call you with those results. Important tips:  Avoid showering during the first 24 hours of wearing the monitor. Avoid excessive sweating to help maximize wear time. Do not submerge the device, no hot tubs, and no swimming pools. Keep any lotions or oils away from the patch. After 24 hours you may shower with the patch on. Take brief showers with your back facing the shower head.  Do not remove patch once it has been placed because that will interrupt data and decrease adhesive wear time. Push the button when you have any symptoms and write down what you were feeling. Once you have completed wearing your monitor, remove and place into box which has postage paid and place in your outgoing mailbox.  If for some reason you have misplaced your box then call our office and we can provide another box and/or mail it off for you.

## 2021-06-04 ENCOUNTER — Ambulatory Visit (INDEPENDENT_AMBULATORY_CARE_PROVIDER_SITE_OTHER): Payer: Medicare Other

## 2021-06-04 DIAGNOSIS — R002 Palpitations: Secondary | ICD-10-CM

## 2021-06-04 NOTE — Progress Notes (Unsigned)
Enrolled patient for a 14 day Zio XT  monitor to be mailed to patients home  °

## 2021-06-10 ENCOUNTER — Encounter: Payer: Self-pay | Admitting: Internal Medicine

## 2021-06-10 ENCOUNTER — Telehealth: Payer: Self-pay

## 2021-06-10 DIAGNOSIS — R002 Palpitations: Secondary | ICD-10-CM

## 2021-06-10 NOTE — Telephone Encounter (Signed)
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 Due to limited improvement with NSAIDs and conservative treatment so far I think she will benefit to see an orthopedist about additional options. She previously saw [name] for her shoulder surgery and plans to contact them first. If they are not able to help in a timely fashion I also agreed we could refer her to a different office for this problem such as [facility] with a request to be seen urgently.

## 2021-06-10 NOTE — Telephone Encounter (Signed)
Patient left a voicemail stating Dr. Benjamine Mola referred her for an MRI which she had on 05/28/21 and hasn't received a call regarding the results.

## 2021-06-11 ENCOUNTER — Telehealth: Payer: Self-pay | Admitting: Internal Medicine

## 2021-06-11 NOTE — Telephone Encounter (Signed)
Patient called the office stating she spoke with Dr. Benjamine Mola at her last appointment and based on her MRI results it was recommended that she see an orthopedic surgeon. Patient states she saw one 2 years ago at [facility] but the physician she saw only did shoulders. Patient states she called to schedule an appointment with another provider in that office and they requested her x-rays, MRI, and office notes. Fax [phone]

## 2021-06-11 NOTE — Telephone Encounter (Signed)
Patient advised she will need to contact [facility] to have them burn her a copy of her MRI and she would need to contact the doctor who did the x-ray and have them burn the cd of her x-rays. Patient advised I would fax over her office notes. Office notes faxed as requested

## 2021-06-14 DIAGNOSIS — M545 Low back pain, unspecified: Secondary | ICD-10-CM | POA: Diagnosis not present

## 2021-06-14 DIAGNOSIS — M1611 Unilateral primary osteoarthritis, right hip: Secondary | ICD-10-CM | POA: Diagnosis not present

## 2021-06-15 ENCOUNTER — Other Ambulatory Visit: Payer: Self-pay | Admitting: Family Medicine

## 2021-06-24 DIAGNOSIS — L570 Actinic keratosis: Secondary | ICD-10-CM | POA: Diagnosis not present

## 2021-06-30 DIAGNOSIS — R002 Palpitations: Secondary | ICD-10-CM | POA: Diagnosis not present

## 2021-07-12 DIAGNOSIS — M1611 Unilateral primary osteoarthritis, right hip: Secondary | ICD-10-CM | POA: Diagnosis not present

## 2021-07-17 ENCOUNTER — Other Ambulatory Visit: Payer: Self-pay | Admitting: Family Medicine

## 2021-07-19 NOTE — Progress Notes (Deleted)
Electrophysiology Office Follow up Visit Note:    Date:  07/19/2021   ID:  JAMILEX BOHNSACK, DOB 15-Sep-1945, MRN [id number]  PCP:  Kathyrn Drown, MD  [facility] Cardiologist:  None  [facility] Electrophysiologist:  Vickie Epley, MD    Interval History:    Terri Douglas is a 76 y.o. female who presents for a follow up visit. They were last seen in clinic June 03, 2021 for palpitations.  She previously had a flutter ablation by Dr. Rayann Heman.  I ordered a 14-day ZIO monitor at that appointment.  ZIO monitor showed 92 episodes of SVT with the longest lasting 45 seconds at an average ventricular rate of 110 bpm.       Past Medical History:  Diagnosis Date   Arthritis    Complication of anesthesia    Gall stones    GERD (gastroesophageal reflux disease)    History of cardiac catheterization 2005   Minor elevation in cardiac enzymes however no significant obstructive CAD   Hyperlipidemia    Hypertension    Insomnia    PONV (postoperative nausea and vomiting)    Prediabetes     Past Surgical History:  Procedure Laterality Date   A-FLUTTER ABLATION N/A 08/01/2017   Procedure: A-FLUTTER ABLATION;  Surgeon: Thompson Grayer, MD;  Location: [place] [facility];  Service: Cardiovascular;  Laterality: N/A;   ABDOMINAL HYSTERECTOMY     BACK SURGERY  1992   CARDIAC CATHETERIZATION     BACK IN 2004  SHE THINKS IT CAME BACK 'NORMAL'   CHOLECYSTECTOMY N/A 06/24/2015   Procedure: LAPAROSCOPIC CHOLECYSTECTOMY;  Surgeon: Mickeal Skinner, MD;  Location: [place];  Service: General;  Laterality: N/A;   COLONOSCOPY N/A 08/30/2012   Procedure: COLONOSCOPY;  Surgeon: Rogene Houston, MD;  [facility];  Service: Endoscopy;  Laterality: N/A;  [place]     ESOPHAGOGASTRODUODENOSCOPY  04/15/2011   Procedure: ESOPHAGOGASTRODUODENOSCOPY (EGD);  Surgeon: Rogene Houston, MD;  [facility];  Service: Endoscopy;  Laterality: N/A;  11:30    ESOPHAGOGASTRODUODENOSCOPY N/A 05/27/2016   Procedure: ESOPHAGOGASTRODUODENOSCOPY (EGD);  Surgeon: Rogene Houston, MD;  [facility];  Service: Endoscopy;  Laterality: N/A;  11:15   EYE SURGERY     cataract removal   NECK SURGERY  12/13/2018   REVERSE SHOULDER ARTHROPLASTY Left 04/03/2019   Procedure: REVERSE SHOULDER ARTHROPLASTY;  Surgeon: Hiram Gash, MD;  Location: [facility];  Service: Orthopedics;  Laterality: Left;   VAGINAL HYSTERECTOMY      Current Medications: No outpatient medications have been marked as taking for the 07/20/21 encounter (Appointment) with Vickie Epley, MD.     Allergies:   Ibuprofen and Phenobarbital   Social History   Socioeconomic History   Marital status: Married    Spouse name: Not on file   Number of children: Not on file   Years of education: Not on file   Highest education level: Not on file  Occupational History   Not on file  Tobacco Use   Smoking status: Never   Smokeless tobacco: Never  Vaping Use   Vaping Use: Never used  Substance and Sexual Activity   Alcohol use: No   Drug use: No   Sexual activity: Never  Other Topics Concern   Not on file  Social History Narrative   Not on file   Social Determinants of Health   Financial Resource Strain: Low Risk  Difficulty of Paying Living Expenses: Not hard at all  Food Insecurity: No Food Insecurity   Worried About [place] in the Last Year: Never true   Ran Out of Food in the Last Year: Never true  Transportation Needs: No Transportation Needs   Lack of Transportation (Medical): No   Lack of Transportation (Non-Medical): No  Physical Activity: Insufficiently Active   Days of Exercise per Week: 3 days   Minutes of Exercise per Session: 30 min  Stress: No Stress Concern Present   Feeling of Stress : Not at all  Social Connections: Socially Integrated   Frequency of Communication with Friends and Family: More than three times a week   Frequency of  Social Gatherings with Friends and Family: More than three times a week   Attends Religious Services: More than 4 times per year   Active Member of [employer] or Organizations: Yes   Attends Music therapist: More than 4 times per year   Marital Status: Married     Family History: The patient's family history includes Bone cancer in her mother; Cancer in her father and mother; Early death in her son; Healthy in her daughter and son; Heart disease in her brother and father; Prostate cancer in her brother and father. There is no history of Anesthesia problems, Hypotension, Malignant hyperthermia, or Pseudochol deficiency.  ROS:   Please see the history of present illness.    All other systems reviewed and are negative.  EKGs/Labs/Other Studies Reviewed:    The following studies were reviewed today:  June 30, 2021 ZIO monitor personally reviewed HR 51 - 197 bpm, average 66 bpm. 92 SVT, longest lasting 45 seconds with an average rate of 110 bpm. Occasional supraventricular ectopy, 2% Rare ventricular ectopy.  EKG:  The ekg ordered today demonstrates ***  Recent Labs: 11/20/2020: Magnesium 1.5 01/28/2021: ALT 11; BUN 14; Creat 0.93; Hemoglobin 12.3; Platelets 461; Potassium 4.4; Sodium 142  Recent Lipid Panel    Component Value Date/Time   CHOL 166 05/21/2020 0812   TRIG 141 05/21/2020 0812   HDL 51 05/21/2020 0812   CHOLHDL 3.3 05/21/2020 0812   CHOLHDL 3.8 06/18/2014 0712   VLDL 47 (H) 06/18/2014 0712   LDLCALC 90 05/21/2020 0812    Physical Exam:    VS:  There were no vitals taken for this visit.    Wt Readings from Last 3 Encounters:  06/03/21 137 lb (62.1 kg)  05/24/21 135 lb 12.8 oz (61.6 kg)  03/16/21 137 lb 9.6 oz (62.4 kg)     GEN: *** Well nourished, well developed in no acute distress HEENT: Normal NECK: No JVD; No carotid bruits LYMPHATICS: No lymphadenopathy CARDIAC: ***RRR, no murmurs, rubs, gallops RESPIRATORY:  Clear to auscultation without  rales, wheezing or rhonchi  ABDOMEN: Soft, non-tender, non-distended MUSCULOSKELETAL:  No edema; No deformity  SKIN: Warm and dry NEUROLOGIC:  Alert and oriented x 3 PSYCHIATRIC:  Normal affect        ASSESSMENT:    No diagnosis found. PLAN:    In order of problems listed above:    Beta-blocker versus calcium channel blocker ?  Flecainide       Total time spent with patient today *** minutes. This includes reviewing records, evaluating the patient and coordinating care.   Medication Adjustments/Labs and Tests Ordered: Current medicines are reviewed at length with the patient today.  Concerns regarding medicines are outlined above.  No orders of the defined types were placed in this encounter.  No orders of the defined types were placed in this encounter.    Signed, Lars Mage, MD, [facility], [facility] 07/19/2021 12:01 PM    Electrophysiology [facility]

## 2021-07-20 ENCOUNTER — Ambulatory Visit (INDEPENDENT_AMBULATORY_CARE_PROVIDER_SITE_OTHER): Payer: Medicare Other | Admitting: Cardiology

## 2021-07-20 ENCOUNTER — Encounter: Payer: Self-pay | Admitting: Cardiology

## 2021-07-20 ENCOUNTER — Other Ambulatory Visit: Payer: Self-pay

## 2021-07-20 VITALS — BP 124/72 | HR 73 | Ht 61.5 in | Wt 139.6 lb

## 2021-07-20 DIAGNOSIS — R002 Palpitations: Secondary | ICD-10-CM

## 2021-07-20 DIAGNOSIS — I10 Essential (primary) hypertension: Secondary | ICD-10-CM | POA: Diagnosis not present

## 2021-07-20 DIAGNOSIS — I483 Typical atrial flutter: Secondary | ICD-10-CM

## 2021-07-20 MED ORDER — METOPROLOL SUCCINATE ER 25 MG PO TB24: 25.0000 mg | ORAL_TABLET | Freq: Every day | ORAL | 3 refills | Status: DC

## 2021-07-20 NOTE — Patient Instructions (Addendum)
Medication Instructions:  Start Metoprolol Succinate 25 mg daily Your physician recommends that you continue on your current medications as directed. Please refer to the Current Medication list given to you today. *If you need a refill on your cardiac medications before your next appointment, please call your pharmacy*  Lab Work: None. If you have labs (blood work) drawn today and your tests are completely normal, you will receive your results only by: [place] (if you have MyChart) OR A paper copy in the mail If you have any lab test that is abnormal or we need to change your treatment, we will call you to review the results.  Testing/Procedures: None.  Follow-Up: At [facility], you and your health needs are our priority.  As part of our continuing mission to provide you with exceptional heart care, we have created designated Provider Care Teams.  These Care Teams include your primary Cardiologist (physician) and Advanced Practice Providers (APPs -  Physician Assistants and Nurse Practitioners) who all work together to provide you with the care you need, when you need it.  Your physician wants you to follow-up in: 3 months with  one of the following Advanced Practice Providers on your designated Care Team:    Tommye Standard, [place] Legrand Como "Jonni Sanger" [place], [place]   We recommend signing up for the patient portal called "MyChart".  Sign up information is provided on this After Visit Summary.  MyChart is used to connect with patients for Virtual Visits (Telemedicine).  Patients are able to view lab/test results, encounter notes, upcoming appointments, etc.  Non-urgent messages can be sent to your provider as well.   To learn more about what you can do with MyChart, go to [email or web address].    Any Other Special Instructions Will Be Listed Below (If Applicable).

## 2021-07-20 NOTE — Progress Notes (Signed)
Electrophysiology Office Follow up Visit Note:    Date:  07/20/2021   ID:  [name], DOB 12/29/1945, MRN [id number]  PCP:  Kathyrn Drown, MD  [facility] Cardiologist:  None  [facility] Electrophysiologist:  Vickie Epley, MD    Interval History:    Terri Douglas is a 76 y.o. female who presents for a follow up visit. They were last seen in clinic June 03, 2021 for palpitations.  She previously had a flutter ablation by Dr. Rayann Heman.  I ordered a 14-day ZIO monitor at that appointment.  ZIO monitor showed 92 episodes of SVT with the longest lasting 45 seconds at an average ventricular rate of 110 bpm.  Overall, she is feeling okay aside from some shortness of breath from time to time. This can occur with minimal exertion, such as shortly after beginning to clean the house. It was also difficult for her to walk from the parking lot today.  She believes she may have been on metoprolol previously.  Her current weight is 139 lbs. She is interested in losing weight until 130 lbs. Offered my advice to work on maintaining her weight with a healthy diet and exercise.  She denies any chest pain, or peripheral edema. No lightheadedness, headaches, syncope, orthopnea, or PND.  Soon she will receive another steroidal injection in her right hip.      Past Medical History:  Diagnosis Date   Arthritis    Complication of anesthesia    Gall stones    GERD (gastroesophageal reflux disease)    History of cardiac catheterization 2005   Minor elevation in cardiac enzymes however no significant obstructive CAD   Hyperlipidemia    Hypertension    Insomnia    PONV (postoperative nausea and vomiting)    Prediabetes     Past Surgical History:  Procedure Laterality Date   A-FLUTTER ABLATION N/A 08/01/2017   Procedure: A-FLUTTER ABLATION;  Surgeon: Thompson Grayer, MD;  Location: [facility];  Service: Cardiovascular;  Laterality: N/A;   ABDOMINAL HYSTERECTOMY     BACK  SURGERY  1992   CARDIAC CATHETERIZATION     BACK IN 2004  SHE THINKS IT CAME BACK 'NORMAL'   CHOLECYSTECTOMY N/A 06/24/2015   Procedure: LAPAROSCOPIC CHOLECYSTECTOMY;  Surgeon: Mickeal Skinner, MD;  Location: [facility];  Service: General;  Laterality: N/A;   COLONOSCOPY N/A 08/30/2012   Procedure: COLONOSCOPY;  Surgeon: Rogene Houston, MD;  [facility];  Service: Endoscopy;  Laterality: N/A;  [place]     ESOPHAGOGASTRODUODENOSCOPY  04/15/2011   Procedure: ESOPHAGOGASTRODUODENOSCOPY (EGD);  Surgeon: Rogene Houston, MD;  [facility];  Service: Endoscopy;  Laterality: N/A;  11:30   ESOPHAGOGASTRODUODENOSCOPY N/A 05/27/2016   Procedure: ESOPHAGOGASTRODUODENOSCOPY (EGD);  Surgeon: Rogene Houston, MD;  [facility];  Service: Endoscopy;  Laterality: N/A;  11:15   EYE SURGERY     cataract removal   NECK SURGERY  12/13/2018   REVERSE SHOULDER ARTHROPLASTY Left 04/03/2019   Procedure: REVERSE SHOULDER ARTHROPLASTY;  Surgeon: Hiram Gash, MD;  Location: [facility];  Service: Orthopedics;  Laterality: Left;   VAGINAL HYSTERECTOMY      Current Medications: Current Meds  Medication Sig   allopurinol (ZYLOPRIM) 100 MG tablet TAKE ONE TABLET (100 MG TOTAL) BY MOUTH DAILY.   ALPRAZolam (XANAX) 1 MG tablet TAKE ONE TABLET BY MOUTH AT BEDTIME AS NEEDED   Biotin 5000 MCG TABS Take  5,000 mcg by mouth daily.   cyclobenzaprine (FLEXERIL) 10 MG tablet Take 1 tablet (10 mg total) by mouth at bedtime as needed for muscle spasms.   esomeprazole (NEXIUM) 40 MG capsule Take 1 capsule (40 mg total) by mouth daily with breakfast.   estradiol (ESTRACE) 2 MG tablet Take 2 mg by mouth daily.    finasteride (PROSCAR) 5 MG tablet Take 2.5 mg by mouth daily.    loperamide (IMODIUM A-D) 2 MG tablet Take 1 tablet (2 mg total) by mouth 4 (four) times daily as needed for diarrhea or loose stools.   LORazepam (ATIVAN) 1 MG tablet Take 1 tablet by mouth 1-2 hours  before scheduled study, 2nd tablet as needed before starting study   Polyethyl Glycol-Propyl Glycol (LUBRICANT EYE DROPS) 0.4-0.3 % SOLN Place 1-2 drops into both eyes daily as needed (dry/irritated eyes.).   potassium chloride (KLOR-CON) 10 MEQ tablet Take 2 tablet in the AM and 2 tablets in the PM   pravastatin (PRAVACHOL) 80 MG tablet TAKE ONE (1) TABLET BY MOUTH EVERY DAY   Probiotic Product (PHILLIPS COLON HEALTH PO) Take 1 capsule by mouth daily as needed (irritable bowel).   sertraline (ZOLOFT) 50 MG tablet TAKE ONE (1) TABLET BY MOUTH EVERY DAY   torsemide (DEMADEX) 20 MG tablet TAKE ONE TABLET (20MG  TOTAL) BY MOUTH EVERY MORNING     Allergies:   Ibuprofen and Phenobarbital   Social History   Socioeconomic History   Marital status: Married    Spouse name: Not on file   Number of children: Not on file   Years of education: Not on file   Highest education level: Not on file  Occupational History   Not on file  Tobacco Use   Smoking status: Never   Smokeless tobacco: Never  Vaping Use   Vaping Use: Never used  Substance and Sexual Activity   Alcohol use: No   Drug use: No   Sexual activity: Never  Other Topics Concern   Not on file  Social History Narrative   Not on file   Social Determinants of Health   Financial Resource Strain: Low Risk    Difficulty of Paying Living Expenses: Not hard at all  Food Insecurity: No Food Insecurity   Worried About Charity fundraiser in the Last Year: Never true   Ran Out of Food in the Last Year: Never true  Transportation Needs: No Transportation Needs   Lack of Transportation (Medical): No   Lack of Transportation (Non-Medical): No  Physical Activity: Insufficiently Active   Days of Exercise per Week: 3 days   Minutes of Exercise per Session: 30 min  Stress: No Stress Concern Present   Feeling of Stress : Not at all  Social Connections: Socially Integrated   Frequency of Communication with Friends and Family: More than three  times a week   Frequency of Social Gatherings with Friends and Family: More than three times a week   Attends Religious Services: More than 4 times per year   Active Member of [employer] or Organizations: Yes   Attends Music therapist: More than 4 times per year   Marital Status: Married     Family History: The patient's family history includes Bone cancer in her mother; Cancer in her father and mother; Early death in her son; Healthy in her daughter and son; Heart disease in her brother and father; Prostate cancer in her brother and father. There is no history of Anesthesia problems, Hypotension, Malignant  hyperthermia, or Pseudochol deficiency.  ROS:   Please see the history of present illness.    (+) Shortness of breath All other systems reviewed and are negative.  EKGs/Labs/Other Studies Reviewed:    The following studies were reviewed today:  June 30, 2021 ZIO monitor personally reviewed HR 51 - 197 bpm, average 66 bpm. 92 SVT, longest lasting 45 seconds with an average rate of 110 bpm. Occasional supraventricular ectopy, 2% Rare ventricular ectopy.  August 01, 2017 A-Flutter Ablation: CONCLUSIONS:   1. Sinus rhythm upon presentation.   2. Atrial flutter not inducible today  3. Empiric cavotricuspid isthmus ablation performed today along the cavotricuspid isthmus with complete bidirectional isthmus block achieved.   4. No inducible arrhythmias following ablation.   5. No early apparent complications.   June 30, 2017   Echo: Study Conclusions   - Left ventricle: The cavity size was normal. Wall thickness was    normal. Systolic function was normal. The estimated ejection    fraction was in the range of 55% to 60%. Wall motion was normal;    there were no regional wall motion abnormalities. Left    ventricular diastolic function parameters were normal.  - Aortic valve: Mildly calcified annulus. Trileaflet; normal    thickness leaflets. Valve area (VTI):  2.04 cm^2. Valve area    (Vmax): 2.26 cm^2. Valve area (Vmean): 2.04 cm^2.  - Technically adequate study.   EKG:  EKG is personally reviewed. 07/20/2021: Sinus rhythm.  Recent Labs: 11/20/2020: Magnesium 1.5 01/28/2021: ALT 11; BUN 14; Creat 0.93; Hemoglobin 12.3; Platelets 461; Potassium 4.4; Sodium 142   Recent Lipid Panel    Component Value Date/Time   CHOL 166 05/21/2020 0812   TRIG 141 05/21/2020 0812   HDL 51 05/21/2020 0812   CHOLHDL 3.3 05/21/2020 0812   CHOLHDL 3.8 06/18/2014 0712   VLDL 47 (H) 06/18/2014 0712   LDLCALC 90 05/21/2020 0812    Physical Exam:    VS:  BP 124/72    Pulse 73    Ht 5' 1.5" (1.562 m)    Wt 139 lb 9.6 oz (63.3 kg)    SpO2 96%    BMI 25.95 kg/m     Wt Readings from Last 3 Encounters:  07/20/21 139 lb 9.6 oz (63.3 kg)  06/03/21 137 lb (62.1 kg)  05/24/21 135 lb 12.8 oz (61.6 kg)     GEN: Well nourished, well developed in no acute distress HEENT: Normal NECK: No JVD; No carotid bruits LYMPHATICS: No lymphadenopathy CARDIAC: RRR, no murmurs, rubs, gallops RESPIRATORY:  Clear to auscultation without rales, wheezing or rhonchi  ABDOMEN: Soft, non-tender, non-distended MUSCULOSKELETAL:  No edema; No deformity  SKIN: Warm and dry NEUROLOGIC:  Alert and oriented x 3 PSYCHIATRIC:  Normal affect        ASSESSMENT:    1. Palpitations   2. Typical atrial flutter ([place])   3. Primary hypertension    PLAN:    In order of problems listed above:  #Palpitations She reports intermittent palpitations with rates into the 90s to 100s.  Her heart monitor shows salvos of atrial tachycardia.  I will start her on metoprolol succinate 25 mg by mouth once daily.  I will have her touch base with one of her APP's in 3 months to assess response to therapy and to further titrate.  Suspect she can follow-up in 1 year after that appointment.  #Hypertension Controlled   Follow-up in 3 months with APP   Medication Adjustments/Labs and Tests  Ordered:  Current medicines are reviewed at length with the patient today.  Concerns regarding medicines are outlined above.   Orders Placed This Encounter  Procedures   EKG 12-Lead   No orders of the defined types were placed in this encounter.  I,Mathew Stumpf,acting as a Education administrator for Vickie Epley, MD.,have documented all relevant documentation on the behalf of Vickie Epley, MD,as directed by  Vickie Epley, MD while in the presence of Vickie Epley, MD.  I, Vickie Epley, MD, have reviewed all documentation for this visit. The documentation on 07/20/21 for the exam, diagnosis, procedures, and orders are all accurate and complete.   Signed, Lars Mage, MD, [facility], [facility] 07/20/2021 10:21 AM    Electrophysiology [facility]

## 2021-07-28 DIAGNOSIS — M5412 Radiculopathy, cervical region: Secondary | ICD-10-CM | POA: Diagnosis not present

## 2021-08-09 DIAGNOSIS — M542 Cervicalgia: Secondary | ICD-10-CM | POA: Diagnosis not present

## 2021-08-09 DIAGNOSIS — M5412 Radiculopathy, cervical region: Secondary | ICD-10-CM | POA: Diagnosis not present

## 2021-08-17 DIAGNOSIS — M542 Cervicalgia: Secondary | ICD-10-CM | POA: Diagnosis not present

## 2021-08-17 DIAGNOSIS — M5412 Radiculopathy, cervical region: Secondary | ICD-10-CM | POA: Diagnosis not present

## 2021-08-19 DIAGNOSIS — L814 Other melanin hyperpigmentation: Secondary | ICD-10-CM | POA: Diagnosis not present

## 2021-08-19 DIAGNOSIS — Z85828 Personal history of other malignant neoplasm of skin: Secondary | ICD-10-CM | POA: Diagnosis not present

## 2021-08-19 DIAGNOSIS — L820 Inflamed seborrheic keratosis: Secondary | ICD-10-CM | POA: Diagnosis not present

## 2021-08-19 DIAGNOSIS — L821 Other seborrheic keratosis: Secondary | ICD-10-CM | POA: Diagnosis not present

## 2021-08-19 DIAGNOSIS — L650 Telogen effluvium: Secondary | ICD-10-CM | POA: Diagnosis not present

## 2021-08-19 DIAGNOSIS — D2271 Melanocytic nevi of right lower limb, including hip: Secondary | ICD-10-CM | POA: Diagnosis not present

## 2021-08-19 DIAGNOSIS — L578 Other skin changes due to chronic exposure to nonionizing radiation: Secondary | ICD-10-CM | POA: Diagnosis not present

## 2021-08-19 DIAGNOSIS — D2261 Melanocytic nevi of right upper limb, including shoulder: Secondary | ICD-10-CM | POA: Diagnosis not present

## 2021-08-19 DIAGNOSIS — H61001 Unspecified perichondritis of right external ear: Secondary | ICD-10-CM | POA: Diagnosis not present

## 2021-08-19 DIAGNOSIS — D224 Melanocytic nevi of scalp and neck: Secondary | ICD-10-CM | POA: Diagnosis not present

## 2021-08-19 DIAGNOSIS — L570 Actinic keratosis: Secondary | ICD-10-CM | POA: Diagnosis not present

## 2021-08-19 DIAGNOSIS — D485 Neoplasm of uncertain behavior of skin: Secondary | ICD-10-CM | POA: Diagnosis not present

## 2021-08-20 ENCOUNTER — Other Ambulatory Visit: Payer: Self-pay | Admitting: Family Medicine

## 2021-08-25 DIAGNOSIS — M542 Cervicalgia: Secondary | ICD-10-CM | POA: Diagnosis not present

## 2021-08-25 DIAGNOSIS — M5412 Radiculopathy, cervical region: Secondary | ICD-10-CM | POA: Diagnosis not present

## 2021-08-26 ENCOUNTER — Encounter

## 2021-09-01 ENCOUNTER — Ambulatory Visit (INDEPENDENT_AMBULATORY_CARE_PROVIDER_SITE_OTHER): Payer: Medicare Other | Admitting: Nurse Practitioner

## 2021-09-01 ENCOUNTER — Encounter: Payer: Self-pay | Admitting: Nurse Practitioner

## 2021-09-01 VITALS — BP 120/77 | HR 69 | Ht 61.5 in | Wt 139.8 lb

## 2021-09-01 DIAGNOSIS — Z13220 Encounter for screening for lipoid disorders: Secondary | ICD-10-CM

## 2021-09-01 DIAGNOSIS — R208 Other disturbances of skin sensation: Secondary | ICD-10-CM | POA: Diagnosis not present

## 2021-09-01 DIAGNOSIS — D692 Other nonthrombocytopenic purpura: Secondary | ICD-10-CM | POA: Diagnosis not present

## 2021-09-01 NOTE — Progress Notes (Signed)
? ?  Subjective:  ? ? Patient ID: Terri Douglas, female    DOB: 08-12-1945, 76 y.o.   MRN: [id number] ? ?HPI ? ?76 year old female patient with history of hypertension, GERD, reactive arthritis, prediabetes, hyperlipidemia presents to clinic with complaints of a burning sensation in both lower legs for 3-4 weeks.  Patient states that the pain of burning is localized to the front of her bilateral legs near her shin.  Patient states that pain worsens as the day goes on.  Patient does admit to some mild swelling to both bilateral arms and bilateral legs.  Patient denies any pain in the back of her legs, pain with walking, pain at night, weakness, tingling, numbness. ? ?Patient also states that she has bruising to her upper arms and mild swelling. ? ?Review of Systems  ?Musculoskeletal:   ?     Burning sensation to bilateral legs.  ? ?   ?Objective:  ? Physical Exam ?Constitutional:   ?   General: She is not in acute distress. ?   Appearance: Normal appearance. She is normal weight. She is not ill-appearing, toxic-appearing or diaphoretic.  ?Musculoskeletal:  ?   Right lower leg: Swelling present. No deformity, lacerations, tenderness or bony tenderness. No edema.  ?   Left lower leg: Swelling present. No deformity, lacerations, tenderness or bony tenderness. No edema.  ?   Right foot: Decreased capillary refill. Normal range of motion. Swelling present. No deformity, bunion, Charcot foot, foot drop, prominent metatarsal heads, laceration, tenderness, bony tenderness or crepitus. Normal pulse.  ?   Left foot: Decreased capillary refill. Normal range of motion. Swelling present. No deformity, bunion, Charcot foot, foot drop, prominent metatarsal heads, laceration, tenderness, bony tenderness or crepitus. Normal pulse.  ?   Comments: Mild tenderness noted to patient's lower leg concentrated at patient's feet. ? ?Sluggish capillary refill noted to patient's bilateral feet.  Bilateral feet slightly mottled to redden.   Bilateral pedal pulses intact slightly weak.  ?Skin: ?   General: Skin is warm.  ?   Capillary Refill: Capillary refill takes 2 to 3 seconds.  ?Neurological:  ?   Mental Status: She is alert.  ?   Comments: Grossly intact.  ?Psychiatric:     ?   Mood and Affect: Mood normal.     ?   Behavior: Behavior normal.  ? ? ? ? ? ?   ?Assessment & Plan:  ?1. Burning sensation of lower extremity ?-Likely neuro related, however venous stasis also considered. ?-Low suspicion for peripheral arterial disease at this time due to lack of claudication. ?-Patient encouraged to wear compression stockings. ?-We will assess CBC and B12 and folate. ?- CMP14+EGFR ?- B12 and Folate Panel ?-If lab work nonconclusive we will consider doing a nerve conduction test. ?-Patient to follow-up with Dr. Nicki Reaper for annual exam on April 27. ? ?2. Senile purpura ([place]) ?-Likely benign however will evaluate CBC and platelets. ?- CBC with Differential ?-Follow-up with Dr. Nicki Reaper for annual exam on April 27 ? ?3. Lipid screening ?- Lipid Profile ? ? ?  ?Note:  This document was prepared using Dragon voice recognition software and may include unintentional dictation errors. ? ? ?

## 2021-09-02 ENCOUNTER — Other Ambulatory Visit: Payer: Self-pay | Admitting: Family Medicine

## 2021-09-02 ENCOUNTER — Telehealth: Payer: Self-pay | Admitting: Family Medicine

## 2021-09-02 LAB — LIPID PANEL
Chol/HDL Ratio: 4.2 ratio (ref 0.0–4.4)
Cholesterol, Total: 281 mg/dL — ABNORMAL HIGH (ref 100–199)
HDL: 67 mg/dL (ref >39)
LDL Chol Calc (NIH): 169 mg/dL — ABNORMAL HIGH (ref 0–99)
Triglycerides: 241 mg/dL — ABNORMAL HIGH (ref 0–149)
VLDL Cholesterol Cal: 45 mg/dL — ABNORMAL HIGH (ref 5–40)

## 2021-09-02 LAB — CMP14+EGFR
ALT: 20 IU/L (ref 0–32)
AST: 31 IU/L (ref 0–40)
Albumin/Globulin Ratio: 2 (ref 1.2–2.2)
Albumin: 4.6 g/dL (ref 3.7–4.7)
Alkaline Phosphatase: 86 IU/L (ref 44–121)
BUN/Creatinine Ratio: 16 (ref 12–28)
BUN: 17 mg/dL (ref 8–27)
Bilirubin Total: <0.2 mg/dL (ref 0.0–1.2)
CO2: 29 mmol/L (ref 20–29)
Calcium: 9.7 mg/dL (ref 8.7–10.3)
Chloride: 101 mmol/L (ref 96–106)
Creatinine, Ser: 1.05 mg/dL — ABNORMAL HIGH (ref 0.57–1.00)
Globulin, Total: 2.3 g/dL (ref 1.5–4.5)
Glucose: 110 mg/dL — ABNORMAL HIGH (ref 70–99)
Potassium: 4.5 mmol/L (ref 3.5–5.2)
Sodium: 145 mmol/L — ABNORMAL HIGH (ref 134–144)
Total Protein: 6.9 g/dL (ref 6.0–8.5)
eGFR: 55 mL/min/{1.73_m2} — ABNORMAL LOW (ref >59)

## 2021-09-02 LAB — CBC WITH DIFFERENTIAL/PLATELET
Basophils Absolute: 0 10*3/uL (ref 0.0–0.2)
Basos: 1 %
EOS (ABSOLUTE): 0.1 10*3/uL (ref 0.0–0.4)
Eos: 1 %
Hematocrit: 42.7 % (ref 34.0–46.6)
Hemoglobin: 13.8 g/dL (ref 11.1–15.9)
Immature Grans (Abs): 0 10*3/uL (ref 0.0–0.1)
Immature Granulocytes: 0 %
Lymphocytes Absolute: 1.4 10*3/uL (ref 0.7–3.1)
Lymphs: 20 %
MCH: 28.3 pg (ref 26.6–33.0)
MCHC: 32.3 g/dL (ref 31.5–35.7)
MCV: 88 fL (ref 79–97)
Monocytes Absolute: 0.6 10*3/uL (ref 0.1–0.9)
Monocytes: 9 %
Neutrophils Absolute: 4.9 10*3/uL (ref 1.4–7.0)
Neutrophils: 69 %
Platelets: 422 10*3/uL (ref 150–450)
RBC: 4.87 x10E6/uL (ref 3.77–5.28)
RDW: 13.3 % (ref 11.7–15.4)
WBC: 7.1 10*3/uL (ref 3.4–10.8)

## 2021-09-02 LAB — B12 AND FOLATE PANEL
Folate: 7.4 ng/mL (ref >3.0)
Vitamin B-12: 185 pg/mL — ABNORMAL LOW (ref 232–1245)

## 2021-09-02 NOTE — Telephone Encounter (Signed)
Nurses-when the patient was present yesterday with Docia Barrier she requested a refill on her Xanax.  I checked her record as well as drug registry she takes Xanax each evening for sleep.  We prescribed 90 tablets on 20 March and this was filled according to drug registry.  So I am not sure what is going on here ?Please try to clarify with the patient her concern or question. ? ?(If she is taking 1 each evening she should have plenty to last her till when I see her in April) ?Apparently there is some sort of communication gap here-any help is appreciated ?

## 2021-09-02 NOTE — Telephone Encounter (Signed)
Patient states she didn't pay attention to them giving her a 90 day supply- she was thinking it was a 30 day supply and she will be fine till her appt. ?

## 2021-09-06 ENCOUNTER — Other Ambulatory Visit (INDEPENDENT_AMBULATORY_CARE_PROVIDER_SITE_OTHER): Payer: Medicare Other | Admitting: *Deleted

## 2021-09-06 DIAGNOSIS — E538 Deficiency of other specified B group vitamins: Secondary | ICD-10-CM

## 2021-09-06 DIAGNOSIS — M5412 Radiculopathy, cervical region: Secondary | ICD-10-CM | POA: Diagnosis not present

## 2021-09-06 DIAGNOSIS — M542 Cervicalgia: Secondary | ICD-10-CM | POA: Diagnosis not present

## 2021-09-06 MED ORDER — CYANOCOBALAMIN 1000 MCG/ML IJ SOLN
1000.0000 ug | Freq: Once | INTRAMUSCULAR | Status: AC
  Administered 2021-09-06: 1000 ug via INTRAMUSCULAR

## 2021-09-07 ENCOUNTER — Other Ambulatory Visit: Payer: Self-pay | Admitting: Family Medicine

## 2021-09-07 DIAGNOSIS — Z79899 Other long term (current) drug therapy: Secondary | ICD-10-CM

## 2021-09-07 DIAGNOSIS — E7849 Other hyperlipidemia: Secondary | ICD-10-CM

## 2021-09-18 ENCOUNTER — Other Ambulatory Visit: Payer: Self-pay | Admitting: Family Medicine

## 2021-09-24 ENCOUNTER — Other Ambulatory Visit: Payer: Medicare Other

## 2021-09-30 ENCOUNTER — Ambulatory Visit (INDEPENDENT_AMBULATORY_CARE_PROVIDER_SITE_OTHER): Payer: Medicare Other | Admitting: Family Medicine

## 2021-09-30 ENCOUNTER — Encounter: Payer: Self-pay | Admitting: Family Medicine

## 2021-09-30 ENCOUNTER — Other Ambulatory Visit: Payer: Self-pay | Admitting: Family Medicine

## 2021-09-30 VITALS — BP 134/76 | HR 66 | Temp 97.2°F | Wt 140.2 lb

## 2021-09-30 DIAGNOSIS — I10 Essential (primary) hypertension: Secondary | ICD-10-CM | POA: Diagnosis not present

## 2021-09-30 DIAGNOSIS — E538 Deficiency of other specified B group vitamins: Secondary | ICD-10-CM | POA: Diagnosis not present

## 2021-09-30 MED ORDER — ALPRAZOLAM 1 MG PO TABS: 1.0000 mg | ORAL_TABLET | Freq: Every evening | ORAL | 1 refills | Status: DC | PRN

## 2021-09-30 MED ORDER — CYANOCOBALAMIN 1000 MCG/ML IJ SOLN
1000.0000 ug | Freq: Once | INTRAMUSCULAR | Status: AC
  Administered 2021-09-30: 1000 ug via INTRAMUSCULAR

## 2021-09-30 MED ORDER — ROSUVASTATIN CALCIUM 20 MG PO TABS: 20.0000 mg | ORAL_TABLET | Freq: Every day | ORAL | 3 refills | Status: DC

## 2021-09-30 MED ORDER — INDAPAMIDE 1.25 MG PO TABS: 1.2500 mg | ORAL_TABLET | Freq: Every day | ORAL | 1 refills | Status: DC

## 2021-09-30 NOTE — Progress Notes (Signed)
? ?Subjective:  ? ? Patient ID: Terri Douglas, female    DOB: [birth year], 76 y.o.   MRN: [id number] ? ?HPI ?Pt here for mediatation follow up. Pt has not began Rosuvastatin at this time due to being out of town. Cardiologist placed patient on Metoprolol 25 mg. ? ?Patient not having any significant palpitations ?Energy level doing okay ? ?B12 deficiency - Plan: cyanocobalamin ((VITAMIN B-12)) injection 1,000 mcg ? ?Primary hypertension - Plan: Basic Metabolic Panel (BMET) ?Results for orders placed or performed in visit on 09/01/21  ?Lipid Profile  ?Result Value Ref Range  ? Cholesterol, Total 281 (H) 100 - 199 mg/dL  ? Triglycerides 241 (H) 0 - 149 mg/dL  ? HDL 67 >39 mg/dL  ? VLDL Cholesterol Cal 45 (H) 5 - 40 mg/dL  ? LDL Chol Calc (NIH) 169 (H) 0 - 99 mg/dL  ? Chol/HDL Ratio 4.2 0.0 - 4.4 ratio  ?CMP14+EGFR  ?Result Value Ref Range  ? Glucose 110 (H) 70 - 99 mg/dL  ? BUN 17 8 - 27 mg/dL  ? Creatinine, Ser 1.05 (H) 0.57 - 1.00 mg/dL  ? eGFR 55 (L) >59 mL/min/1.73  ? BUN/Creatinine Ratio 16 12 - 28  ? Sodium 145 (H) 134 - 144 mmol/L  ? Potassium 4.5 3.5 - 5.2 mmol/L  ? Chloride 101 96 - 106 mmol/L  ? CO2 29 20 - 29 mmol/L  ? Calcium 9.7 8.7 - 10.3 mg/dL  ? Total Protein 6.9 6.0 - 8.5 g/dL  ? Albumin 4.6 3.7 - 4.7 g/dL  ? Globulin, Total 2.3 1.5 - 4.5 g/dL  ? Albumin/Globulin Ratio 2.0 1.2 - 2.2  ? Bilirubin Total <0.2 0.0 - 1.2 mg/dL  ? Alkaline Phosphatase 86 44 - 121 IU/L  ? AST 31 0 - 40 IU/L  ? ALT 20 0 - 32 IU/L  ?B12 and Folate Panel  ?Result Value Ref Range  ? Vitamin B-12 185 (L) 232 - 1,245 pg/mL  ? Folate 7.4 >3.0 ng/mL  ?CBC with Differential  ?Result Value Ref Range  ? WBC 7.1 3.4 - 10.8 x10E3/uL  ? RBC 4.87 3.77 - 5.28 x10E6/uL  ? Hemoglobin 13.8 11.1 - 15.9 g/dL  ? Hematocrit 42.7 34.0 - 46.6 %  ? MCV 88 79 - 97 fL  ? MCH 28.3 26.6 - 33.0 pg  ? MCHC 32.3 31.5 - 35.7 g/dL  ? RDW 13.3 11.7 - 15.4 %  ? Platelets 422 150 - 450 x10E3/uL  ? Neutrophils 69 Not Estab. %  ? Lymphs 20 Not Estab. %  ? Monocytes 9  Not Estab. %  ? Eos 1 Not Estab. %  ? Basos 1 Not Estab. %  ? Neutrophils Absolute 4.9 1.4 - 7.0 x10E3/uL  ? Lymphocytes Absolute 1.4 0.7 - 3.1 x10E3/uL  ? Monocytes Absolute 0.6 0.1 - 0.9 x10E3/uL  ? EOS (ABSOLUTE) 0.1 0.0 - 0.4 x10E3/uL  ? Basophils Absolute 0.0 0.0 - 0.2 x10E3/uL  ? Immature Granulocytes 0 Not Estab. %  ? Immature Grans (Abs) 0.0 0.0 - 0.1 x10E3/uL  ? ?We did review the lab work including low B12 ?Review of Systems ? ?   ?Objective:  ? Physical Exam ?General-in no acute distress ?Eyes-no discharge ?Lungs-respiratory rate normal, CTA ?CV-no murmurs,RRR ?Extremities skin warm dry no edema ?Neuro grossly normal ?Behavior normal, alert ? ? ? ? ?   ?Assessment & Plan:  ?Uses Xanax at night to help her with rest continue this ? ?Comprehensive labs by fall time ?Change from torsemide to  independent 8 continue potassium check metabolic 7 in a couple weeks ? ?B12 shot once a week for the next 2 weeks then the 1000 mcg orally daily ? ?Cholesterol change in rosuvastatin to get better control ? ?Follow-up by September/October ? ? ?

## 2021-09-30 NOTE — Patient Instructions (Addendum)
Do 4 weeks of Vit B12 shots then 1045mg of B12 once a day by mouth ? ?We will check labs in mid August ? ? ?DASH Eating Plan ?DASH stands for Dietary Approaches to Stop Hypertension. The DASH eating plan is a healthy eating plan that has been shown to: ?Reduce high blood pressure (hypertension). ?Reduce your risk for type 2 diabetes, heart disease, and stroke. ?Help with weight loss. ?What are tips for following this plan? ?Reading food labels ?Check food labels for the amount of salt (sodium) per serving. Choose foods with less than 5 percent of the Daily Value of sodium. Generally, foods with less than 300 milligrams (mg) of sodium per serving fit into this eating plan. ?To find whole grains, look for the word "whole" as the first word in the ingredient list. ?Shopping ?Buy products labeled as "low-sodium" or "no salt added." ?Buy fresh foods. Avoid canned foods and pre-made or frozen meals. ?Cooking ?Avoid adding salt when cooking. Use salt-free seasonings or herbs instead of table salt or sea salt. Check with your health care provider or pharmacist before using salt substitutes. ?Do not fry foods. Cook foods using healthy methods such as baking, boiling, grilling, roasting, and broiling instead. ?Cook with heart-healthy oils, such as olive, canola, avocado, soybean, or sunflower oil. ?Meal planning ? ?Eat a balanced diet that includes: ?4 or more servings of fruits and 4 or more servings of vegetables each day. Try to fill one-half of your plate with fruits and vegetables. ?6-8 servings of whole grains each day. ?Less than 6 oz (170 g) of lean meat, poultry, or fish each day. A 3-oz (85-g) serving of meat is about the same size as a deck of cards. One egg equals 1 oz (28 g). ?2-3 servings of low-fat dairy each day. One serving is 1 cup (237 mL). ?1 serving of nuts, seeds, or beans 5 times each week. ?2-3 servings of heart-healthy fats. Healthy fats called omega-3 fatty acids are found in foods such as walnuts,  flaxseeds, fortified milks, and eggs. These fats are also found in cold-water fish, such as sardines, salmon, and mackerel. ?Limit how much you eat of: ?Canned or prepackaged foods. ?Food that is high in trans fat, such as some fried foods. ?Food that is high in saturated fat, such as fatty meat. ?Desserts and other sweets, sugary drinks, and other foods with added sugar. ?Full-fat dairy products. ?Do not salt foods before eating. ?Do not eat more than 4 egg yolks a week. ?Try to eat at least 2 vegetarian meals a week. ?Eat more home-cooked food and less restaurant, buffet, and fast food. ?Lifestyle ?When eating at a restaurant, ask that your food be prepared with less salt or no salt, if possible. ?If you drink alcohol: ?Limit how much you use to: ?0-1 drink a day for women who are not pregnant. ?0-2 drinks a day for men. ?Be aware of how much alcohol is in your drink. In the [place] one drink equals one 12 oz bottle of beer (355 mL), one 5 oz glass of wine (148 mL), or one 1? oz glass of hard liquor (44 mL). ?General information ?Avoid eating more than 2,300 mg of salt a day. If you have hypertension, you may need to reduce your sodium intake to 1,500 mg a day. ?Work with your health care provider to maintain a healthy body weight or to lose weight. Ask what an ideal weight is for you. ?Get at least 30 minutes of exercise that causes your heart  to beat faster (aerobic exercise) most days of the week. Activities may include walking, swimming, or biking. ?Work with your health care provider or dietitian to adjust your eating plan to your individual calorie needs. ?What foods should I eat? ?Fruits ?All fresh, dried, or frozen fruit. Canned fruit in natural juice (without added sugar). ?Vegetables ?Fresh or frozen vegetables (raw, steamed, roasted, or grilled). Low-sodium or reduced-sodium tomato and vegetable juice. Low-sodium or reduced-sodium tomato sauce and tomato paste. Low-sodium or reduced-sodium canned  vegetables. ?Grains ?Whole-grain or whole-wheat bread. Whole-grain or whole-wheat pasta. Brown rice. [place]. Bulgur. Whole-grain and low-sodium cereals. Pita bread. Low-fat, low-sodium crackers. Whole-wheat flour tortillas. ?Meats and other proteins ?Skinless chicken or [place]. Ground chicken or [place]. Pork with fat trimmed off. Fish and seafood. Egg whites. Dried beans, peas, or lentils. Unsalted nuts, nut butters, and seeds. Unsalted canned beans. Lean cuts of beef with fat trimmed off. Low-sodium, lean precooked or cured meat, such as sausages or meat loaves. ?Dairy ?Low-fat (1%) or fat-free (skim) milk. Reduced-fat, low-fat, or fat-free cheeses. Nonfat, low-sodium ricotta or cottage cheese. Low-fat or nonfat yogurt. Low-fat, low-sodium cheese. ?Fats and oils ?Soft margarine without trans fats. Vegetable oil. Reduced-fat, low-fat, or light mayonnaise and salad dressings (reduced-sodium). Canola, safflower, olive, avocado, soybean, and sunflower oils. Avocado. ?Seasonings and condiments ?Herbs. Spices. Seasoning mixes without salt. ?Other foods ?Unsalted popcorn and pretzels. Fat-free sweets. ?The items listed above may not be a complete list of foods and beverages you can eat. Contact a dietitian for more information. ?What foods should I avoid? ?Fruits ?Canned fruit in a light or heavy syrup. Fried fruit. Fruit in cream or butter sauce. ?Vegetables ?Creamed or fried vegetables. Vegetables in a cheese sauce. Regular canned vegetables (not low-sodium or reduced-sodium). Regular canned tomato sauce and paste (not low-sodium or reduced-sodium). Regular tomato and vegetable juice (not low-sodium or reduced-sodium). Angie Fava. Olives. ?Grains ?Baked goods made with fat, such as croissants, muffins, or some breads. Dry pasta or rice meal packs. ?Meats and other proteins ?Fatty cuts of meat. Ribs. Fried meat. Berniece Salines. Bologna, salami, and other precooked or cured meats, such as sausages or meat loaves. Fat from  the back of a pig (fatback). Bratwurst. Salted nuts and seeds. Canned beans with added salt. Canned or smoked fish. Whole eggs or egg yolks. Chicken or [place] with skin. ?Dairy ?Whole or 2% milk, cream, and half-and-half. Whole or full-fat cream cheese. Whole-fat or sweetened yogurt. Full-fat cheese. Nondairy creamers. Whipped toppings. Processed cheese and cheese spreads. ?Fats and oils ?Butter. Stick margarine. Lard. Shortening. Ghee. Bacon fat. Tropical oils, such as coconut, palm kernel, or palm oil. ?Seasonings and condiments ?Onion salt, garlic salt, seasoned salt, table salt, and sea salt. Worcestershire sauce. Tartar sauce. Barbecue sauce. Teriyaki sauce. Soy sauce, including reduced-sodium. Steak sauce. Canned and packaged gravies. Fish sauce. Oyster sauce. Cocktail sauce. Store-bought horseradish. Ketchup. Mustard. Meat flavorings and tenderizers. Bouillon cubes. Hot sauces. Pre-made or packaged marinades. Pre-made or packaged taco seasonings. Relishes. Regular salad dressings. ?Other foods ?Salted popcorn and pretzels. ?The items listed above may not be a complete list of foods and beverages you should avoid. Contact a dietitian for more information. ?Where to find more information ?National Heart, Lung, and Blood Institute: https://wilson-eaton.com/ ?American Heart Association: www.heart.org ?Academy of Nutrition and Dietetics: www.eatright.org ?[place]: www.kidney.org ?Summary ?The DASH eating plan is a healthy eating plan that has been shown to reduce high blood pressure (hypertension). It may also reduce your risk for type 2 diabetes, heart disease, and stroke. ?  When on the DASH eating plan, aim to eat more fresh fruits and vegetables, whole grains, lean proteins, low-fat dairy, and heart-healthy fats. ?With the DASH eating plan, you should limit salt (sodium) intake to 2,300 mg a day. If you have hypertension, you may need to reduce your sodium intake to 1,500 mg a day. ?Work with your  health care provider or dietitian to adjust your eating plan to your individual calorie needs. ?This information is not intended to replace advice given to you by your health care provider. Make sure you discuss a

## 2021-10-01 DIAGNOSIS — M25551 Pain in right hip: Secondary | ICD-10-CM | POA: Diagnosis not present

## 2021-10-07 ENCOUNTER — Telehealth: Payer: Self-pay | Admitting: Family Medicine

## 2021-10-07 ENCOUNTER — Other Ambulatory Visit (INDEPENDENT_AMBULATORY_CARE_PROVIDER_SITE_OTHER): Payer: Medicare Other

## 2021-10-07 DIAGNOSIS — E538 Deficiency of other specified B group vitamins: Secondary | ICD-10-CM

## 2021-10-07 DIAGNOSIS — Z79899 Other long term (current) drug therapy: Secondary | ICD-10-CM

## 2021-10-07 MED ORDER — CYANOCOBALAMIN 1000 MCG/ML IJ SOLN
1000.0000 ug | Freq: Once | INTRAMUSCULAR | Status: AC
  Administered 2021-10-07: 1000 ug via INTRAMUSCULAR

## 2021-10-07 NOTE — Telephone Encounter (Signed)
Pt needing to know if she is to continue both fluid pills. Pt states she was placed on blood pressure pill that had a fluid pill in it. Pt states she was already on one fluid pill. She is now urinating "like crazy". Pt also having some cramping in hands. Please advise. Thank you ? ?(Pt also would like Dr.Scott to know that she LOVES Star Wars) ?

## 2021-10-10 NOTE — Telephone Encounter (Signed)
Nurses ?Please clarify with patient regarding which 2 diuretics that she on ?I see indapamide ..... Is there another? ?Is she taking her potassium ?Once I have this information I can properly answer her question ?

## 2021-10-11 ENCOUNTER — Ambulatory Visit (INDEPENDENT_AMBULATORY_CARE_PROVIDER_SITE_OTHER): Payer: Medicare Other | Admitting: Student

## 2021-10-11 ENCOUNTER — Encounter: Payer: Self-pay | Admitting: Student

## 2021-10-11 VITALS — BP 122/70 | HR 65 | Ht 61.0 in | Wt 132.0 lb

## 2021-10-11 DIAGNOSIS — I10 Essential (primary) hypertension: Secondary | ICD-10-CM | POA: Diagnosis not present

## 2021-10-11 DIAGNOSIS — R002 Palpitations: Secondary | ICD-10-CM

## 2021-10-11 MED ORDER — METOPROLOL SUCCINATE ER 50 MG PO TB24: 50.0000 mg | ORAL_TABLET | Freq: Every day | ORAL | 3 refills | Status: DC

## 2021-10-11 NOTE — Addendum Note (Signed)
Addended by: Dairl Ponder on: 10/11/2021 10:35 AM ? ? Modules accepted: Orders ? ?

## 2021-10-11 NOTE — Patient Instructions (Signed)
Medication Instructions:  ?Your physician has recommended you make the following change in your medication:  ? ?INCREASE: Metoprolol Succinate to '50mg'$  daily at bedtime ? ?*If you need a refill on your cardiac medications before your next appointment, please call your pharmacy* ? ? ?Lab Work: ?None  ?If you have labs (blood work) drawn today and your tests are completely normal, you will receive your results only by: ?MyChart Message (if you have MyChart) OR ?A paper copy in the mail ?If you have any lab test that is abnormal or we need to change your treatment, we will call you to review the results. ? ? ?Follow-Up: ?At [facility], you and your health needs are our priority.  As part of our continuing mission to provide you with exceptional heart care, we have created designated Provider Care Teams.  These Care Teams include your primary Cardiologist (physician) and Advanced Practice Providers (APPs -  Physician Assistants and Nurse Practitioners) who all work together to provide you with the care you need, when you need it. ? ? ?Your next appointment:   ?6 month(s) ? ?The format for your next appointment:   ?In Person ? ?Provider:   ?Lars Mage, MD{ ? ?  ?

## 2021-10-11 NOTE — Progress Notes (Signed)
? ?PCP:  Kathyrn Drown, MD ?Primary Cardiologist: None ?Electrophysiologist: Vickie Epley, MD  ? ?Terri Douglas is a 76 y.o. female seen today for Vickie Epley, MD for routine electrophysiology followup.  Since last being seen in our clinic the patient reports doing OK. She laid out in the sun this weekend and noticed an increase in palpitations and skipped beats, otherwise she had been doing well. Has had some non-specific fatigue as well.  she denies chest pain, dyspnea, PND, orthopnea, nausea, vomiting, dizziness, syncope, edema, weight gain, or early satiety. ? ?Past Medical History:  ?Diagnosis Date  ? Arthritis   ? Complication of anesthesia   ? Gall stones   ? GERD (gastroesophageal reflux disease)   ? History of cardiac catheterization 2005  ? Minor elevation in cardiac enzymes however no significant obstructive CAD  ? Hyperlipidemia   ? Hypertension   ? Insomnia   ? PONV (postoperative nausea and vomiting)   ? Prediabetes   ? ?Past Surgical History:  ?Procedure Laterality Date  ? A-FLUTTER ABLATION N/A 08/01/2017  ? Procedure: A-FLUTTER ABLATION;  Surgeon: Thompson Grayer, MD;  Location: [facility];  Service: Cardiovascular;  Laterality: N/A;  ? ABDOMINAL HYSTERECTOMY    ? BACK SURGERY  1992  ? CARDIAC CATHETERIZATION    ? BACK IN 2004  SHE THINKS IT CAME BACK 'NORMAL'  ? CHOLECYSTECTOMY N/A 06/24/2015  ? Procedure: LAPAROSCOPIC CHOLECYSTECTOMY;  Surgeon: Mickeal Skinner, MD;  Location: [facility];  Service: General;  Laterality: N/A;  ? COLONOSCOPY N/A 08/30/2012  ? Procedure: COLONOSCOPY;  Surgeon: Rogene Houston, MD;  [facility];  Service: Endoscopy;  Laterality: N/A;  830  ? DILATION AND CURETTAGE OF UTERUS    ? ESOPHAGOGASTRODUODENOSCOPY  04/15/2011  ? Procedure: ESOPHAGOGASTRODUODENOSCOPY (EGD);  Surgeon: Rogene Houston, MD;  [facility];  Service: Endoscopy;  Laterality: N/A;  11:30  ? ESOPHAGOGASTRODUODENOSCOPY N/A 05/27/2016  ? Procedure:  ESOPHAGOGASTRODUODENOSCOPY (EGD);  Surgeon: Rogene Houston, MD;  [facility];  Service: Endoscopy;  Laterality: N/A;  11:15  ? EYE SURGERY    ? cataract removal  ? NECK SURGERY  12/13/2018  ? REVERSE SHOULDER ARTHROPLASTY Left 04/03/2019  ? Procedure: REVERSE SHOULDER ARTHROPLASTY;  Surgeon: Hiram Gash, MD;  Location: [facility];  Service: Orthopedics;  Laterality: Left;  ? VAGINAL HYSTERECTOMY    ? ? ?Current Outpatient Medications  ?Medication Sig Dispense Refill  ? allopurinol (ZYLOPRIM) 100 MG tablet TAKE ONE TABLET (100 MG TOTAL) BY MOUTH DAILY. 30 tablet 0  ? ALPRAZolam (XANAX) 1 MG tablet Take 1 tablet (1 mg total) by mouth at bedtime as needed. 90 tablet 1  ? Biotin 5000 MCG TABS Take 5,000 mcg by mouth daily.    ? cyclobenzaprine (FLEXERIL) 10 MG tablet Take 1 tablet (10 mg total) by mouth at bedtime as needed for muscle spasms. 30 tablet 2  ? esomeprazole (NEXIUM) 40 MG capsule Take 1 capsule (40 mg total) by mouth daily with breakfast. 90 capsule 3  ? estradiol (ESTRACE) 2 MG tablet Take 2 mg by mouth daily.     ? finasteride (PROSCAR) 5 MG tablet Take 2.5 mg by mouth daily.   4  ? indapamide (LOZOL) 1.25 MG tablet Take 1 tablet (1.25 mg total) by mouth daily. 90 tablet 1  ? loperamide (IMODIUM A-D) 2 MG tablet Take 1 tablet (2 mg total) by mouth 4 (four) times daily as needed for diarrhea or loose stools. [place]  tablet 0  ? LORazepam (ATIVAN) 1 MG tablet Take 1 tablet by mouth 1-2 hours before scheduled study, 2nd tablet as needed before starting study 2 tablet 0  ? metoprolol succinate (TOPROL XL) 25 MG 24 hr tablet Take 1 tablet (25 mg total) by mouth daily. 90 tablet 3  ? Polyethyl Glycol-Propyl Glycol (LUBRICANT EYE DROPS) 0.4-0.3 % SOLN Place 1-2 drops into both eyes daily as needed (dry/irritated eyes.).    ? potassium chloride (KLOR-CON) 10 MEQ tablet Take 2 tablet in the AM and 2 tablets in the PM 360 tablet 1  ? Probiotic Product (PHILLIPS COLON HEALTH PO) Take 1 capsule by mouth daily  as needed (irritable bowel).    ? rosuvastatin (CRESTOR) 20 MG tablet Take 1 tablet (20 mg total) by mouth daily. 90 tablet 3  ? sertraline (ZOLOFT) 50 MG tablet TAKE ONE (1) TABLET BY MOUTH EVERY DAY 90 tablet 1  ? metoprolol tartrate (LOPRESSOR) 25 MG tablet Take 25 mg by mouth daily. (Patient not taking: Reported on 10/11/2021)    ? ?No current facility-administered medications for this visit.  ? ? ?Allergies  ?Allergen Reactions  ? Ibuprofen Hives  ? Phenobarbital Other (See Comments)  ?  Causes blurred vision  ? ? ?Social History  ? ?Socioeconomic History  ? Marital status: Married  ?  Spouse name: Not on file  ? Number of children: Not on file  ? Years of education: Not on file  ? Highest education level: Not on file  ?Occupational History  ? Not on file  ?Tobacco Use  ? Smoking status: Never  ? Smokeless tobacco: Never  ?Vaping Use  ? Vaping Use: Never used  ?Substance and Sexual Activity  ? Alcohol use: No  ? Drug use: No  ? Sexual activity: Never  ?Other Topics Concern  ? Not on file  ?Social History Narrative  ? Not on file  ? ?Social Determinants of Health  ? ?Financial Resource Strain: Low Risk   ? Difficulty of Paying Living Expenses: Not hard at all  ?Food Insecurity: No Food Insecurity  ? Worried About Charity fundraiser in the Last Year: Never true  ? Ran Out of Food in the Last Year: Never true  ?Transportation Needs: No Transportation Needs  ? Lack of Transportation (Medical): No  ? Lack of Transportation (Non-Medical): No  ?Physical Activity: Insufficiently Active  ? Days of Exercise per Week: 3 days  ? Minutes of Exercise per Session: 30 min  ?Stress: No Stress Concern Present  ? Feeling of Stress : Not at all  ?Social Connections: Socially Integrated  ? Frequency of Communication with Friends and Family: More than three times a week  ? Frequency of Social Gatherings with Friends and Family: More than three times a week  ? Attends Religious Services: More than 4 times per year  ? Active Member of  Clubs or Organizations: Yes  ? Attends Archivist Meetings: More than 4 times per year  ? Marital Status: Married  ?Intimate Partner Violence: Not At Risk  ? Fear of Current or Ex-Partner: No  ? Emotionally Abused: No  ? Physically Abused: No  ? Sexually Abused: No  ? ? ? ?Review of Systems: ?All other systems reviewed and are otherwise negative except as noted above. ? ?Physical Exam: ?Vitals:  ? 10/11/21 0911  ?BP: 122/70  ?Pulse: 65  ?SpO2: 99%  ?Weight: 132 lb (59.9 kg)  ?Height: '5\' 1"'$  (1.549 m)  ? ? ?GEN- The patient is  well appearing, alert and oriented x 3 today.   ?HEENT: normocephalic, atraumatic; sclera clear, conjunctiva pink; hearing intact; oropharynx clear; neck supple, no JVP ?Lymph- no cervical lymphadenopathy ?Lungs- Clear to ausculation bilaterally, normal work of breathing.  No wheezes, rales, rhonchi ?Heart-  Irregular due to ectopy, no murmurs, rubs or gallops, PMI not laterally displaced ?GI- soft, non-tender, non-distended, bowel sounds present, no hepatosplenomegaly ?Extremities- no clubbing, cyanosis, or edema; DP/PT/radial pulses 2+ bilaterally ?MS- no significant deformity or atrophy ?Skin- warm and dry, no rash or lesion ?Psych- euthymic mood, full affect ?Neuro- strength and sensation are intact ? ?EKG is ordered. EKg shows NSR 66 bpm. Rhythm strip shows 1 PVC and 1 PAC in 15-20 seconds.  ? ?Additional studies reviewed include: ?Previous EP office notes.  ? ?Assessment and Plan: ? ?1. Palpitations ?2. Atrial tachycardia by monitoring  ?PVCs and PACs by rhythm strip today. Increase Toprol to 50 mg qhs.  ? ?2. HTN ?Stable on current regimen  ? ?Follow up with Dr. Quentin Ore in 6 months  ? ?Shirley Friar, [name]  ?10/11/21 ?9:19 AM  ?

## 2021-10-11 NOTE — Telephone Encounter (Signed)
[facility]; also sent my chart message ?

## 2021-10-11 NOTE — Telephone Encounter (Signed)
We did discuss having her do a metabolic 7 within the next 7 to 14 days to make sure her potassium is holding up with the change in medicine thank you ?

## 2021-10-11 NOTE — Telephone Encounter (Signed)
Patient states she was taking the torsemide and the indapamide because she didn't realized to stop torsemide when the new one was sent in but she will stop the torsemide as directed and only take indapamide. Patient also states she takes her potassium as ordered  ?

## 2021-10-11 NOTE — Telephone Encounter (Signed)
Blood work ordered in Epic. Patient notified. 

## 2021-10-12 ENCOUNTER — Other Ambulatory Visit: Payer: Self-pay | Admitting: Family Medicine

## 2021-10-14 ENCOUNTER — Other Ambulatory Visit (INDEPENDENT_AMBULATORY_CARE_PROVIDER_SITE_OTHER): Payer: Medicare Other | Admitting: *Deleted

## 2021-10-14 DIAGNOSIS — E538 Deficiency of other specified B group vitamins: Secondary | ICD-10-CM

## 2021-10-14 MED ORDER — CYANOCOBALAMIN 1000 MCG/ML IJ SOLN
1000.0000 ug | Freq: Once | INTRAMUSCULAR | Status: AC
  Administered 2021-10-14: 1000 ug via INTRAMUSCULAR

## 2021-10-18 DIAGNOSIS — M5412 Radiculopathy, cervical region: Secondary | ICD-10-CM | POA: Diagnosis not present

## 2021-10-18 DIAGNOSIS — M542 Cervicalgia: Secondary | ICD-10-CM | POA: Diagnosis not present

## 2021-10-26 ENCOUNTER — Ambulatory Visit: Payer: Medicare Other | Admitting: Cardiology

## 2021-11-03 ENCOUNTER — Other Ambulatory Visit: Payer: Self-pay | Admitting: Family Medicine

## 2021-11-08 ENCOUNTER — Encounter: Payer: Self-pay | Admitting: Family Medicine

## 2021-11-08 ENCOUNTER — Ambulatory Visit (INDEPENDENT_AMBULATORY_CARE_PROVIDER_SITE_OTHER): Payer: Medicare Other | Admitting: Family Medicine

## 2021-11-08 VITALS — BP 140/86 | HR 66 | Temp 97.0°F | Wt 137.4 lb

## 2021-11-08 DIAGNOSIS — F0781 Postconcussional syndrome: Secondary | ICD-10-CM | POA: Diagnosis not present

## 2021-11-08 DIAGNOSIS — E7849 Other hyperlipidemia: Secondary | ICD-10-CM | POA: Diagnosis not present

## 2021-11-08 DIAGNOSIS — I10 Essential (primary) hypertension: Secondary | ICD-10-CM | POA: Diagnosis not present

## 2021-11-08 DIAGNOSIS — D692 Other nonthrombocytopenic purpura: Secondary | ICD-10-CM | POA: Diagnosis not present

## 2021-11-08 DIAGNOSIS — R270 Ataxia, unspecified: Secondary | ICD-10-CM

## 2021-11-08 NOTE — Progress Notes (Signed)
   Subjective:    Patient ID: Terri Douglas, female    DOB: 1946/04/17, 76 y.o.   MRN: [id number]  HPI Pt fell last Wednesday night. Pt tripped over step in building then stumbled and hit freezer. Pt states her head doesn't feel right. Pt hit left side of face.  Patient complains of pain discomfort from where she fell she hit her head she had her neck or left arm she states both arms bruise got really bad She also relates she feels pain in the side of her head the top of her head she also relates difficulty focusing and thinking straight since the injury intermittent headaches slight nausea no vomiting no blurred double vision or blurred vision states the pain fairly severe difficulty walking as well  Pt hit left arm on pantry and rolled her skin, left arm swollen at wrist area.  She also complains of intermittent tremor  Review of Systems     Objective:   Physical Exam  No cogwheeling Her walk is steady Lungs are clear hearts regular pulse normal Subjective discomfort on the side of her head top of her head bruising on both arms noted Per progress analysis on both arms     Assessment & Plan:  1. Ataxia It is concerning that she complains of imbalance since having this injury it is quite possible to help postconcussion syndrome but we will do a CAT scan to rule out subdural hematoma - CT HEAD WO CONTRAST (5MM) - Basic Metabolic Panel (BMET) - CBC with Differential - Lipid Profile - Hepatic function panel  2. Concussion syndrome Please see above CAT scan indicated.  I do not feel the patient needs to go to the ER with this.  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 Primary hypertension Blood pressure decent control - Basic Metabolic Panel (BMET) - CBC with Differential - Lipid Profile - Hepatic function panel  5. Other hyperlipidemia Patient due to check cholesterol - Basic Metabolic Panel (BMET) - CBC with Differential - Lipid Profile - Hepatic function panel Very important for the patient to recheck again in 3 to 4 weeks sooner if any problems avoid all falls

## 2021-11-09 ENCOUNTER — Ambulatory Visit (HOSPITAL_COMMUNITY)
Admission: RE | Admit: 2021-11-09 | Discharge: 2021-11-09 | Disposition: A | Discharge status: Home / Self Care | Payer: Medicare Other | Source: Ambulatory Visit | Attending: Family Medicine | Admitting: Family Medicine

## 2021-11-09 ENCOUNTER — Other Ambulatory Visit (HOSPITAL_COMMUNITY): Payer: Medicare Other

## 2021-11-09 DIAGNOSIS — S0990XA Unspecified injury of head, initial encounter: Secondary | ICD-10-CM | POA: Diagnosis not present

## 2021-11-09 DIAGNOSIS — R270 Ataxia, unspecified: Secondary | ICD-10-CM | POA: Diagnosis not present

## 2021-11-09 DIAGNOSIS — F0781 Postconcussional syndrome: Secondary | ICD-10-CM | POA: Insufficient documentation

## 2021-11-10 DIAGNOSIS — R270 Ataxia, unspecified: Secondary | ICD-10-CM | POA: Diagnosis not present

## 2021-11-10 DIAGNOSIS — D692 Other nonthrombocytopenic purpura: Secondary | ICD-10-CM | POA: Diagnosis not present

## 2021-11-10 DIAGNOSIS — I10 Essential (primary) hypertension: Secondary | ICD-10-CM | POA: Diagnosis not present

## 2021-11-10 DIAGNOSIS — F0781 Postconcussional syndrome: Secondary | ICD-10-CM | POA: Diagnosis not present

## 2021-11-10 DIAGNOSIS — E7849 Other hyperlipidemia: Secondary | ICD-10-CM | POA: Diagnosis not present

## 2021-11-11 LAB — LIPID PANEL
Chol/HDL Ratio: 2.4 ratio (ref 0.0–4.4)
Cholesterol, Total: 159 mg/dL (ref 100–199)
HDL: 66 mg/dL (ref >39)
LDL Chol Calc (NIH): 70 mg/dL (ref 0–99)
Triglycerides: 136 mg/dL (ref 0–149)
VLDL Cholesterol Cal: 23 mg/dL (ref 5–40)

## 2021-11-11 LAB — CBC WITH DIFFERENTIAL/PLATELET
Basophils Absolute: 0 10*3/uL (ref 0.0–0.2)
Basos: 1 %
EOS (ABSOLUTE): 0.1 10*3/uL (ref 0.0–0.4)
Eos: 1 %
Hematocrit: 41.7 % (ref 34.0–46.6)
Hemoglobin: 13.1 g/dL (ref 11.1–15.9)
Immature Grans (Abs): 0 10*3/uL (ref 0.0–0.1)
Immature Granulocytes: 0 %
Lymphocytes Absolute: 1.2 10*3/uL (ref 0.7–3.1)
Lymphs: 21 %
MCH: 28.5 pg (ref 26.6–33.0)
MCHC: 31.4 g/dL — ABNORMAL LOW (ref 31.5–35.7)
MCV: 91 fL (ref 79–97)
Monocytes Absolute: 0.5 10*3/uL (ref 0.1–0.9)
Monocytes: 8 %
Neutrophils Absolute: 4.1 10*3/uL (ref 1.4–7.0)
Neutrophils: 69 %
Platelets: 383 10*3/uL (ref 150–450)
RBC: 4.59 x10E6/uL (ref 3.77–5.28)
RDW: 13.2 % (ref 11.7–15.4)
WBC: 6 10*3/uL (ref 3.4–10.8)

## 2021-11-11 LAB — HEPATIC FUNCTION PANEL
ALT: 17 IU/L (ref 0–32)
AST: 25 IU/L (ref 0–40)
Albumin: 4.2 g/dL (ref 3.7–4.7)
Alkaline Phosphatase: 78 IU/L (ref 44–121)
Bilirubin Total: 0.4 mg/dL (ref 0.0–1.2)
Bilirubin, Direct: 0.11 mg/dL (ref 0.00–0.40)
Total Protein: 6.5 g/dL (ref 6.0–8.5)

## 2021-11-11 LAB — BASIC METABOLIC PANEL
BUN/Creatinine Ratio: 12 (ref 12–28)
BUN: 13 mg/dL (ref 8–27)
CO2: 26 mmol/L (ref 20–29)
Calcium: 9.1 mg/dL (ref 8.7–10.3)
Chloride: 100 mmol/L (ref 96–106)
Creatinine, Ser: 1.07 mg/dL — ABNORMAL HIGH (ref 0.57–1.00)
Glucose: 92 mg/dL (ref 70–99)
Potassium: 4.5 mmol/L (ref 3.5–5.2)
Sodium: 139 mmol/L (ref 134–144)
eGFR: 54 mL/min/{1.73_m2} — ABNORMAL LOW (ref >59)

## 2021-11-23 ENCOUNTER — Other Ambulatory Visit: Payer: Self-pay | Admitting: Family Medicine

## 2021-11-29 ENCOUNTER — Encounter: Payer: Self-pay | Admitting: Family Medicine

## 2021-11-29 ENCOUNTER — Ambulatory Visit (INDEPENDENT_AMBULATORY_CARE_PROVIDER_SITE_OTHER): Payer: Medicare Other | Admitting: Family Medicine

## 2021-11-29 VITALS — BP 138/76 | HR 67 | Temp 98.2°F | Ht 61.0 in | Wt 138.0 lb

## 2021-11-29 DIAGNOSIS — R270 Ataxia, unspecified: Secondary | ICD-10-CM | POA: Diagnosis not present

## 2021-11-29 DIAGNOSIS — E538 Deficiency of other specified B group vitamins: Secondary | ICD-10-CM | POA: Diagnosis not present

## 2021-11-29 MED ORDER — SERTRALINE HCL 50 MG PO TABS: ORAL_TABLET | ORAL | 1 refills | Status: DC

## 2021-12-01 DIAGNOSIS — M25512 Pain in left shoulder: Secondary | ICD-10-CM | POA: Diagnosis not present

## 2021-12-02 ENCOUNTER — Other Ambulatory Visit: Payer: Self-pay | Admitting: Family Medicine

## 2021-12-10 ENCOUNTER — Other Ambulatory Visit (INDEPENDENT_AMBULATORY_CARE_PROVIDER_SITE_OTHER): Payer: Self-pay | Admitting: Internal Medicine

## 2021-12-10 ENCOUNTER — Other Ambulatory Visit: Payer: Self-pay | Admitting: Family Medicine

## 2021-12-10 DIAGNOSIS — R1319 Other dysphagia: Secondary | ICD-10-CM

## 2021-12-10 DIAGNOSIS — K219 Gastro-esophageal reflux disease without esophagitis: Secondary | ICD-10-CM

## 2021-12-28 ENCOUNTER — Inpatient Hospital Stay: Admit: 2021-12-28 | Payer: MEDICARE | Primary: Physician Assistant

## 2021-12-28 DIAGNOSIS — I10 Essential (primary) hypertension: Secondary | ICD-10-CM

## 2021-12-28 LAB — COMPREHENSIVE METABOLIC PANEL
ALT: 24 U/L (ref 13–56)
AST: 19 U/L (ref 10–38)
Albumin/Globulin Ratio: 1.2 (ref 0.8–1.7)
Albumin: 3.9 g/dL (ref 3.4–5.0)
Alk Phosphatase: 99 U/L (ref 45–117)
Anion Gap: 5 mmol/L (ref 3.0–18)
BUN: 22 MG/DL — ABNORMAL HIGH (ref 7.0–18)
Bun/Cre Ratio: 20 (ref 12–20)
CO2: 33 mmol/L — ABNORMAL HIGH (ref 21–32)
Calcium: 9.6 MG/DL (ref 8.5–10.1)
Chloride: 101 mmol/L (ref 100–111)
Creatinine: 1.11 MG/DL (ref 0.6–1.3)
Est, Glom Filt Rate: 52 mL/min/{1.73_m2} — ABNORMAL LOW (ref >60)
Globulin: 3.3 g/dL (ref 2.0–4.0)
Glucose: 100 mg/dL — ABNORMAL HIGH (ref 74–99)
Potassium: 3.3 mmol/L — ABNORMAL LOW (ref 3.5–5.5)
Sodium: 139 mmol/L (ref 136–145)
Total Bilirubin: 0.6 MG/DL (ref 0.2–1.0)
Total Protein: 7.2 g/dL (ref 6.4–8.2)

## 2021-12-28 LAB — CBC WITH AUTO DIFFERENTIAL
Absolute Immature Granulocyte: 0.1 10*3/uL — ABNORMAL HIGH (ref 0.00–0.04)
Basophils %: 1 % (ref 0–2)
Basophils Absolute: 0.1 10*3/uL (ref 0.0–0.1)
Eosinophils %: 5 % (ref 0–5)
Eosinophils Absolute: 0.3 10*3/uL (ref 0.0–0.4)
Hematocrit: 44 % (ref 35.0–45.0)
Hemoglobin: 14.7 g/dL (ref 12.0–16.0)
Immature Granulocytes: 1 % — ABNORMAL HIGH (ref 0.0–0.5)
Lymphocytes %: 24 % (ref 21–52)
Lymphocytes Absolute: 1.4 10*3/uL (ref 0.9–3.6)
MCH: 30.1 PG (ref 24.0–34.0)
MCHC: 33.4 g/dL (ref 31.0–37.0)
MCV: 90.2 FL (ref 78.0–100.0)
MPV: 10.8 FL (ref 9.2–11.8)
Monocytes %: 8 % (ref 3–10)
Monocytes Absolute: 0.5 10*3/uL (ref 0.05–1.2)
Neutrophils %: 61 % (ref 40–73)
Neutrophils Absolute: 3.5 10*3/uL (ref 1.8–8.0)
Nucleated RBCs: 0 PER 100 WBC
Platelets: 233 10*3/uL (ref 135–420)
RBC: 4.88 M/uL (ref 4.20–5.30)
RDW: 12.6 % (ref 11.6–14.5)
WBC: 5.7 10*3/uL (ref 4.6–13.2)
nRBC: 0 10*3/uL (ref 0.00–0.01)

## 2021-12-28 LAB — LIPID PANEL
Chol/HDL Ratio: 5.2 — ABNORMAL HIGH (ref 0–5.0)
Cholesterol, Total: 298 MG/DL — ABNORMAL HIGH (ref <200)
HDL: 57 MG/DL (ref 40–60)
LDL Calculated: 211.8 MG/DL — ABNORMAL HIGH (ref 0–100)
Triglycerides: 146 MG/DL (ref <150)
VLDL Cholesterol Calculated: 29.2 MG/DL

## 2021-12-28 LAB — TSH + FREE T4 PANEL
T4 Free: 1.4 NG/DL (ref 0.7–1.5)
TSH, 3RD GENERATION: 0.71 u[IU]/mL (ref 0.36–3.74)

## 2021-12-28 LAB — VITAMIN D 25 HYDROXY: Vit D, 25-Hydroxy: 60.9 ng/mL (ref 30–100)

## 2021-12-30 ENCOUNTER — Other Ambulatory Visit: Payer: Self-pay | Admitting: Family Medicine

## 2022-01-08 ENCOUNTER — Other Ambulatory Visit: Payer: Self-pay | Admitting: Family Medicine

## 2022-01-17 ENCOUNTER — Telehealth: Payer: Self-pay

## 2022-01-17 NOTE — Telephone Encounter (Signed)
Last labs 11/2021: Lipid, Liver, CBC, Met 7

## 2022-01-17 NOTE — Telephone Encounter (Signed)
Nurses-please talk with patient.  She did have cholesterol profile along with other lab work in June the only lab work I would recommend for October would be a metabolic 7 in the urine ACR because of elevated creatinine and diuretic use thank you

## 2022-01-17 NOTE — Telephone Encounter (Signed)
Caller name:Latorya Tamala Julian   On DPR? :Yes  Call back [phone]  Provider they see: Dr Nicki Reaper   Reason for call:Pt has a upcoming 3 month follow up on October the 2nd will the pt need blood work done before that?

## 2022-01-18 ENCOUNTER — Other Ambulatory Visit: Payer: Self-pay

## 2022-01-18 DIAGNOSIS — R7989 Other specified abnormal findings of blood chemistry: Secondary | ICD-10-CM

## 2022-01-18 NOTE — Telephone Encounter (Signed)
Patient has been made aware of her new lab orders.

## 2022-02-02 ENCOUNTER — Telehealth: Payer: Self-pay | Admitting: *Deleted

## 2022-02-02 NOTE — Patient Outreach (Signed)
  Care Coordination   Initial Visit Note   02/02/2022 Name: LEANN MAYWEATHER MRN: [id number] DOB: 03/27/1946  SACORA HAWBAKER is a 76 y.o. year old female who sees Luking, Elayne Snare, MD for primary care. I spoke with  Garnette Gunner by phone today.  What matters to the patients health and wellness today?  No concerns expressed.     Goals Addressed   None     SDOH assessments and interventions completed:  No     Care Coordination Interventions Activated:  No  Care Coordination Interventions:  No, not indicated   Follow up plan: No further intervention required.   Encounter Outcome:  Pt. [place] [facility] [phone]

## 2022-02-09 DIAGNOSIS — L814 Other melanin hyperpigmentation: Secondary | ICD-10-CM | POA: Diagnosis not present

## 2022-02-09 DIAGNOSIS — L570 Actinic keratosis: Secondary | ICD-10-CM | POA: Diagnosis not present

## 2022-02-09 DIAGNOSIS — D2271 Melanocytic nevi of right lower limb, including hip: Secondary | ICD-10-CM | POA: Diagnosis not present

## 2022-02-09 DIAGNOSIS — D485 Neoplasm of uncertain behavior of skin: Secondary | ICD-10-CM | POA: Diagnosis not present

## 2022-02-09 DIAGNOSIS — D225 Melanocytic nevi of trunk: Secondary | ICD-10-CM | POA: Diagnosis not present

## 2022-02-09 DIAGNOSIS — C44519 Basal cell carcinoma of skin of other part of trunk: Secondary | ICD-10-CM | POA: Diagnosis not present

## 2022-02-09 DIAGNOSIS — D2261 Melanocytic nevi of right upper limb, including shoulder: Secondary | ICD-10-CM | POA: Diagnosis not present

## 2022-02-09 DIAGNOSIS — L578 Other skin changes due to chronic exposure to nonionizing radiation: Secondary | ICD-10-CM | POA: Diagnosis not present

## 2022-02-09 DIAGNOSIS — L821 Other seborrheic keratosis: Secondary | ICD-10-CM | POA: Diagnosis not present

## 2022-02-09 DIAGNOSIS — Z85828 Personal history of other malignant neoplasm of skin: Secondary | ICD-10-CM | POA: Diagnosis not present

## 2022-02-09 DIAGNOSIS — D224 Melanocytic nevi of scalp and neck: Secondary | ICD-10-CM | POA: Diagnosis not present

## 2022-02-10 ENCOUNTER — Telehealth: Payer: Self-pay | Admitting: Family Medicine

## 2022-02-10 ENCOUNTER — Other Ambulatory Visit: Payer: Self-pay | Admitting: Family Medicine

## 2022-02-10 ENCOUNTER — Ambulatory Visit: Payer: Self-pay | Admitting: Family Medicine

## 2022-02-10 MED ORDER — COLCHICINE 0.6 MG PO TABS: ORAL_TABLET | ORAL | 0 refills | Status: DC

## 2022-02-10 NOTE — Telephone Encounter (Signed)
Pt contacted office and states that she is about 99.9% sure that she has gout in her left great toe. Pt is on Allopurinol 100 mg daily. Pt would like to know if something else can be called in. [place]. Please advise. Thank you

## 2022-02-10 NOTE — Telephone Encounter (Signed)
Pt contacted and verbalized understanding.  

## 2022-02-16 DIAGNOSIS — Z1231 Encounter for screening mammogram for malignant neoplasm of breast: Secondary | ICD-10-CM | POA: Diagnosis not present

## 2022-02-16 DIAGNOSIS — Z6825 Body mass index (BMI) 25.0-25.9, adult: Secondary | ICD-10-CM | POA: Diagnosis not present

## 2022-02-16 DIAGNOSIS — Z01419 Encounter for gynecological examination (general) (routine) without abnormal findings: Secondary | ICD-10-CM | POA: Diagnosis not present

## 2022-02-21 ENCOUNTER — Other Ambulatory Visit: Payer: Self-pay | Admitting: Obstetrics and Gynecology

## 2022-02-21 DIAGNOSIS — R928 Other abnormal and inconclusive findings on diagnostic imaging of breast: Secondary | ICD-10-CM

## 2022-03-01 ENCOUNTER — Ambulatory Visit: Payer: Medicare Other | Admitting: Family Medicine

## 2022-03-02 DIAGNOSIS — R7989 Other specified abnormal findings of blood chemistry: Secondary | ICD-10-CM | POA: Diagnosis not present

## 2022-03-03 LAB — BASIC METABOLIC PANEL
BUN/Creatinine Ratio: 14 (ref 12–28)
BUN: 21 mg/dL (ref 8–27)
CO2: 31 mmol/L — ABNORMAL HIGH (ref 20–29)
Calcium: 9.4 mg/dL (ref 8.7–10.3)
Chloride: 90 mmol/L — ABNORMAL LOW (ref 96–106)
Creatinine, Ser: 1.54 mg/dL — ABNORMAL HIGH (ref 0.57–1.00)
Glucose: 118 mg/dL — ABNORMAL HIGH (ref 70–99)
Potassium: 3.3 mmol/L — ABNORMAL LOW (ref 3.5–5.2)
Sodium: 142 mmol/L (ref 134–144)
eGFR: 35 mL/min/{1.73_m2} — ABNORMAL LOW (ref >59)

## 2022-03-03 LAB — MICROALBUMIN / CREATININE URINE RATIO
Creatinine, Urine: 170.8 mg/dL
Microalb/Creat Ratio: 9 mg/g creat (ref 0–29)
Microalbumin, Urine: 15.4 ug/mL

## 2022-03-07 ENCOUNTER — Other Ambulatory Visit: Payer: Self-pay | Admitting: Family Medicine

## 2022-03-07 ENCOUNTER — Ambulatory Visit (INDEPENDENT_AMBULATORY_CARE_PROVIDER_SITE_OTHER): Payer: Medicare Other | Admitting: Family Medicine

## 2022-03-07 VITALS — BP 99/69 | HR 62 | Temp 97.0°F | Wt 133.0 lb

## 2022-03-07 DIAGNOSIS — D692 Other nonthrombocytopenic purpura: Secondary | ICD-10-CM | POA: Diagnosis not present

## 2022-03-07 DIAGNOSIS — E876 Hypokalemia: Secondary | ICD-10-CM | POA: Diagnosis not present

## 2022-03-07 DIAGNOSIS — R7989 Other specified abnormal findings of blood chemistry: Secondary | ICD-10-CM

## 2022-03-07 NOTE — Progress Notes (Signed)
   Subjective:    Patient ID: Terri Douglas, female    DOB: 01-22-1946, 76 y.o.   MRN: [id number]  HPI 3  month follow up  Patient has recently increased potassium per instructions and has brought in her med bottles today She is under a lot of stress with her husband who has early signs of dementia Having to do a lot for him She brings in her medications today to go over We did discuss her elevated creatinine and low potassium Apparently she has been taking both of her diuretics despite on last time starting indapamide and stopping the torsemide  Review of Systems     Objective:   Physical Exam General-in no acute distress Eyes-no discharge Lungs-respiratory rate normal, CTA CV-no murmurs,RRR Extremities skin warm dry no edema Neuro grossly normal Behavior normal, alert  Senile purpura on the arms Pressure rechecked best reading 110/70     Assessment & Plan:  1. Hypokalemia She will take her potassium twice a day for the next 10 to 12 days then once a day thereafter and she will do blood work before her next follow-up visit - Basic metabolic panel -Magnesium  2. Elevated serum creatinine Her blood pressure is on the low end she will go ahead and hold indapamide Stop torsemide  - Basic metabolic panel -Magnesium  3. Senile purpura ([place]) Noted on the arms  Patient interested in regards to what exercises to do when she starts going to the gym we will discuss this at her follow-up visit in 2 weeks

## 2022-03-23 ENCOUNTER — Ambulatory Visit: Payer: Medicare Other

## 2022-03-23 ENCOUNTER — Ambulatory Visit
Admission: RE | Admit: 2022-03-23 | Discharge: 2022-03-23 | Disposition: A | Discharge status: Home / Self Care | Payer: Medicare Other | Source: Ambulatory Visit | Attending: Obstetrics and Gynecology | Admitting: Obstetrics and Gynecology

## 2022-03-23 ENCOUNTER — Other Ambulatory Visit: Payer: Self-pay | Admitting: Family Medicine

## 2022-03-23 DIAGNOSIS — R92333 Mammographic heterogeneous density, bilateral breasts: Secondary | ICD-10-CM | POA: Diagnosis not present

## 2022-03-23 DIAGNOSIS — R928 Other abnormal and inconclusive findings on diagnostic imaging of breast: Secondary | ICD-10-CM

## 2022-03-23 DIAGNOSIS — R7989 Other specified abnormal findings of blood chemistry: Secondary | ICD-10-CM | POA: Diagnosis not present

## 2022-03-23 DIAGNOSIS — E876 Hypokalemia: Secondary | ICD-10-CM | POA: Diagnosis not present

## 2022-03-24 ENCOUNTER — Ambulatory Visit (INDEPENDENT_AMBULATORY_CARE_PROVIDER_SITE_OTHER): Payer: Medicare Other | Admitting: Family Medicine

## 2022-03-24 VITALS — BP 132/81 | HR 66 | Temp 97.7°F | Ht 61.0 in | Wt 146.0 lb

## 2022-03-24 DIAGNOSIS — D692 Other nonthrombocytopenic purpura: Secondary | ICD-10-CM | POA: Diagnosis not present

## 2022-03-24 DIAGNOSIS — H9190 Unspecified hearing loss, unspecified ear: Secondary | ICD-10-CM | POA: Diagnosis not present

## 2022-03-24 DIAGNOSIS — Z23 Encounter for immunization: Secondary | ICD-10-CM | POA: Diagnosis not present

## 2022-03-24 DIAGNOSIS — R600 Localized edema: Secondary | ICD-10-CM | POA: Diagnosis not present

## 2022-03-24 DIAGNOSIS — M109 Gout, unspecified: Secondary | ICD-10-CM

## 2022-03-24 LAB — BASIC METABOLIC PANEL
BUN/Creatinine Ratio: 7 — ABNORMAL LOW (ref 12–28)
BUN: 7 mg/dL — ABNORMAL LOW (ref 8–27)
CO2: 22 mmol/L (ref 20–29)
Calcium: 9 mg/dL (ref 8.7–10.3)
Chloride: 107 mmol/L — ABNORMAL HIGH (ref 96–106)
Creatinine, Ser: 1.05 mg/dL — ABNORMAL HIGH (ref 0.57–1.00)
Glucose: 82 mg/dL (ref 70–99)
Potassium: 4.6 mmol/L (ref 3.5–5.2)
Sodium: 144 mmol/L (ref 134–144)
eGFR: 55 mL/min/{1.73_m2} — ABNORMAL LOW (ref >59)

## 2022-03-24 LAB — HEPATIC FUNCTION PANEL
ALT: 15 IU/L (ref 0–32)
AST: 21 IU/L (ref 0–40)
Albumin: 3.9 g/dL (ref 3.8–4.8)
Alkaline Phosphatase: 58 IU/L (ref 44–121)
Bilirubin Total: 0.5 mg/dL (ref 0.0–1.2)
Bilirubin, Direct: 0.15 mg/dL (ref 0.00–0.40)
Total Protein: 5.8 g/dL — ABNORMAL LOW (ref 6.0–8.5)

## 2022-03-24 MED ORDER — ALLOPURINOL 100 MG PO TABS: ORAL_TABLET | ORAL | 1 refills | Status: DC

## 2022-03-24 MED ORDER — HYDROCHLOROTHIAZIDE 12.5 MG PO CAPS: 12.5000 mg | ORAL_CAPSULE | Freq: Every day | ORAL | 3 refills | Status: DC

## 2022-03-24 MED ORDER — COLCHICINE 0.6 MG PO TABS: ORAL_TABLET | ORAL | 2 refills | Status: DC

## 2022-03-24 NOTE — Progress Notes (Signed)
   Subjective:    Patient ID: Terri Douglas, female    DOB: 02-12-1946, 76 y.o.   MRN: [id number]  HPI Follow up hypokalemia and creatinine Pedal edema - Plan: Basic metabolic panel, Magnesium, Uric Acid, Flu Vaccine QUAD High Dose(Fluad)  Acute gout of right foot, unspecified cause - Plan: Basic metabolic panel, Magnesium, Uric Acid, Flu Vaccine QUAD High Dose(Fluad)  Immunization due - Plan: Flu Vaccine QUAD High Dose(Fluad)  Hearing loss, unspecified hearing loss type, unspecified laterality - Plan: Ambulatory referral to ENT  Patient relates having a difficult time hearing would like to see ENT audiology  Has bunion as well as gout on the right foot causing pain discomfort soreness present for several days tried colchicine in the past which caused diarrhea does take allopurinol  Significant pedal edema both legs patient not as active [place]  Review of Systems     Objective:   Physical Exam General-in no acute distress Eyes-no discharge Lungs-respiratory rate normal, CTA CV-no murmurs,RRR Extremities skin warm dry mild edema Neuro grossly normal Behavior normal, alert Senile purpura     Assessment & Plan:  1. Pedal edema HCTZ 12.'5mg'$  1 every morning - Basic metabolic panel - Magnesium - Uric Acid - Flu Vaccine QUAD High Dose(Fluad)  2. Acute gout of right foot, unspecified cause Increase allopurinol to each morning Colchicine caution side effects - Basic metabolic panel - Magnesium - Uric Acid - Flu Vaccine QUAD High Dose(Fluad)  3. Immunization due Flu shot - Flu Vaccine QUAD High Dose(Fluad)  4. Hearing loss, unspecified hearing loss type, unspecified laterality Referral to ENT - Ambulatory referral to ENT  5. Senile purpura ([place]) Stable  Patient to do blood work in 2 weeks patient to follow low purine diet patient to follow-up within 4 weeks

## 2022-03-24 NOTE — Patient Instructions (Signed)
Low-Purine Eating Plan A low-purine eating plan involves making food choices to limit your purine intake. Purine is a kind of uric acid. Too much uric acid in your blood can cause certain conditions, such as gout and kidney stones. Eating a low-purine diet may help control these conditions. What are tips for following this plan? Shopping Avoid buying products that contain high-fructose corn syrup. Check for this on food labels. It is commonly found in many processed foods and soft drinks. Be sure to check for it in baked goods such as cookies, canned fruits, and cereals and cereal bars. Avoid buying veal, chicken breast with skin, lamb, and organ meats such as liver. These types of meats tend to have the highest purine content. Choose dairy products. These may lower uric acid levels. Avoid certain types of fish. Not all fish and seafood have high purine content. Examples with high purine content include anchovies, trout, tuna, sardines, and salmon. Avoid buying beverages that contain alcohol, particularly beer and hard liquor. Alcohol can affect the way your body gets rid of uric acid. Meal planning  Learn which foods do or do not affect you. If you find out that a food tends to cause your gout symptoms to flare up, avoid eating that food. You can enjoy foods that do not cause problems. If you have any questions about a food item, talk with your dietitian or health care provider. Reduce the overall amount of meat in your diet. When you do eat meat, choose ones with lower purine content. Include plenty of fruits and vegetables. Although some vegetables may have a high purine content--such as asparagus, mushrooms, spinach, or cauliflower--it has been shown that these do not contribute to uric acid blood levels as much. Consume at least 1 dairy serving a day. This has been shown to decrease uric acid levels. General information If you drink alcohol: Limit how much you have to: 0-1 drink a day for  women who are not pregnant. 0-2 drinks a day for men. Know how much alcohol is in a drink. In the [place] one drink equals one 12 oz bottle of beer (355 mL), one 5 oz glass of wine (148 mL), or one 1 oz glass of hard liquor (44 mL). Drink plenty of water. Try to drink enough to keep your urine pale yellow. Fluids can help remove uric acid from your body. Work with your health care provider and dietitian to develop a plan to achieve or maintain a healthy weight. Losing weight may help reduce uric acid in your blood. What foods are recommended? The following are some types of foods that are good choices when limiting purine intake: Fresh or frozen fruits and vegetables. Whole grains, breads, cereals, and pasta. Rice. Beans, peas, legumes. Nuts and seeds. Dairy products. Fats and oils. The items listed above may not be a complete list. Talk with a dietitian about what dietary choices are best for you. What foods are not recommended? Limit your intake of foods high in purines, including: Beer and other alcohol. Meat-based gravy or sauce. Canned or fresh fish, such as: Anchovies, sardines, herring, salmon, and tuna. Mussels and scallops. Codfish, trout, and haddock. Bacon, veal, chicken breast with skin, and lamb. Organ meats, such as: Liver or kidney. Tripe. Sweetbreads (thymus gland or pancreas). Wild game or goose. Yeast or yeast extract supplements. Drinks sweetened with high-fructose corn syrup, such as soda. Processed foods made with high-fructose corn syrup. The items listed above may not be a complete list of foods   and beverages you should limit. Contact a dietitian for more information. Summary Eating a low-purine diet may help control conditions caused by too much uric acid in the body, such as gout or kidney stones. Choose low-purine foods, limit alcohol, and limit high-fructose corn syrup. You will learn over time which foods do or do not affect you. If you find out that a  food tends to cause your gout symptoms to flare up, avoid eating that food. This information is not intended to replace advice given to you by your health care provider. Make sure you discuss any questions you have with your health care provider. Document Revised: 05/06/2021 Document Reviewed: 05/06/2021 Elsevier Patient Education  2023 Elsevier Inc.  

## 2022-03-28 ENCOUNTER — Ambulatory Visit (INDEPENDENT_AMBULATORY_CARE_PROVIDER_SITE_OTHER): Payer: Medicare Other | Admitting: Family Medicine

## 2022-03-28 DIAGNOSIS — S81802A Unspecified open wound, left lower leg, initial encounter: Secondary | ICD-10-CM | POA: Diagnosis not present

## 2022-03-28 NOTE — Progress Notes (Signed)
Subjective:  Patient ID: Terri Douglas, female    DOB: 04-24-1946  Age: 76 y.o. MRN: [id number]  CC: Chief Complaint  Patient presents with   left leg spot    Hit it 2 weeks ago on door ,open and red  its raw pain and leaking yellow fluid    HPI:  76 year old female presents for evaluation of a wound of her left lower extremity.  Patient reports that approximately 2 weeks ago she hit her left lower leg on the shower door and suffered a wound.  Was recently seen here.  Eschar has now fallen off.  She states that the area is painful.  She is concerned about infection.  She states that it has been draining yellow fluid.  No fever.  No other associated symptoms.   Patient Active Problem List   Diagnosis Date Noted   Open wound of left lower leg 03/28/2022   Reactive arthritis ([place]) 12/28/2020   Arthritis 11/20/2020   Laryngopharyngeal reflux (LPR) 09/28/2020   Primary osteoarthritis of right hip 01/17/2020   Pedal edema 11/19/2019   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   Insomnia 03/15/2019   Hyperlipidemia 06/18/2014   Gout 06/18/2014   Osteopenia 10/28/2013   Hypertension 03/29/2011    Social Hx   Social History   Socioeconomic History   Marital status: Married    Spouse name: Not on file   Number of children: Not on file   Years of education: Not on file   Highest education level: Not on file  Occupational History   Not on file  Tobacco Use   Smoking status: Never   Smokeless tobacco: Never  Vaping Use   Vaping Use: Never used  Substance and Sexual Activity   Alcohol use: No   Drug use: No   Sexual activity: Never  Other Topics Concern   Not on file  Social History Narrative   Not on file   Social Determinants of Health   Financial Resource Strain: Low Risk  (03/16/2021)   Overall Financial Resource Strain (CARDIA)    Difficulty of Paying Living Expenses: Not hard at all  Food Insecurity: No Food Insecurity (03/16/2021)   Hunger  Vital Sign    Worried About Running Out of Food in the Last Year: Never true    Ran Out of Food in the Last Year: Never true  Transportation Needs: No Transportation Needs (03/16/2021)   PRAPARE - Hydrologist (Medical): No    Lack of Transportation (Non-Medical): No  Physical Activity: Insufficiently Active (03/16/2021)   Exercise Vital Sign    Days of Exercise per Week: 3 days    Minutes of Exercise per Session: 30 min  Stress: No Stress Concern Present (03/16/2021)   [place]    Feeling of Stress : Not at all  Social Connections: [place] (03/16/2021)   Social Connection and Isolation Panel [NHANES]    Frequency of Communication with Friends and Family: More than three times a week    Frequency of Social Gatherings with Friends and Family: More than three times a week    Attends Religious Services: More than 4 times per year    Active Member of [employer] or Organizations: Yes    Attends Music therapist: More than 4 times per year    Marital Status: Married    Review of Systems Per HPI  Objective:  BP (!) 145/77  Pulse 84   Ht '5\' 1"'$  (1.549 m)   Wt 136 lb 12.8 oz (62.1 kg)   SpO2 97%   BMI 25.85 kg/m      03/28/2022    1:58 PM 03/24/2022   10:24 AM 03/07/2022    8:37 AM  BP/Weight  Systolic BP 465 681 99  Diastolic BP 77 81 69  Wt. (Lbs) 136.8 146 133  BMI 25.85 kg/m2 27.59 kg/m2 25.13 kg/m2    Physical Exam Vitals and nursing note reviewed.  Constitutional:      General: She is not in acute distress.    Appearance: Normal appearance.  HENT:     Head: Normocephalic and atraumatic.  Pulmonary:     Effort: Pulmonary effort is normal. No respiratory distress.  Skin:    Comments: Small healing wound to the left lower extremity.  There is no surrounding erythema or purulent discharge.  Neurological:     Mental Status: She is alert.     Lab  Results  Component Value Date   WBC 6.0 11/10/2021   HGB 13.1 11/10/2021   HCT 41.7 11/10/2021   PLT 383 11/10/2021   GLUCOSE 82 03/23/2022   CHOL 159 11/10/2021   TRIG 136 11/10/2021   HDL 66 11/10/2021   LDLCALC 70 11/10/2021   ALT 15 03/23/2022   AST 21 03/23/2022   NA 144 03/23/2022   K 4.6 03/23/2022   CL 107 (H) 03/23/2022   CREATININE 1.05 (H) 03/23/2022   BUN 7 (L) 03/23/2022   CO2 22 03/23/2022   TSH 3.440 06/26/2017   INR 1.1 11/20/2020   HGBA1C 5.9 (H) 05/21/2020     Assessment & Plan:   Problem List Items Addressed This Visit       Other   Open wound of left lower leg    Appears to be healing.  There is no evidence of infection.  Advise use of Vaseline.  Supportive care.      Follow-up:  As previously directed by PCP  [place]

## 2022-03-28 NOTE — Patient Instructions (Signed)
Vaseline daily.  No evidence of infection.  Take care  Dr. Lacinda Axon

## 2022-03-28 NOTE — Assessment & Plan Note (Signed)
Appears to be healing.  There is no evidence of infection.  Advise use of Vaseline.  Supportive care.

## 2022-03-31 DIAGNOSIS — L719 Rosacea, unspecified: Secondary | ICD-10-CM | POA: Diagnosis not present

## 2022-03-31 DIAGNOSIS — C44619 Basal cell carcinoma of skin of left upper limb, including shoulder: Secondary | ICD-10-CM | POA: Diagnosis not present

## 2022-04-04 ENCOUNTER — Telehealth: Payer: Self-pay | Admitting: *Deleted

## 2022-04-04 DIAGNOSIS — H9190 Unspecified hearing loss, unspecified ear: Secondary | ICD-10-CM

## 2022-04-04 NOTE — Telephone Encounter (Signed)
I would recommend referral to [place] office for hearing evaluation or [facility] ENT

## 2022-04-04 NOTE — Telephone Encounter (Signed)
Patient left message that she called the number she was given at her office visit and inquired about hearing testing and she was told they do not do that there and she would need referral to ENT for that  Please advise

## 2022-04-05 ENCOUNTER — Telehealth: Payer: Self-pay

## 2022-04-05 NOTE — Telephone Encounter (Signed)
Caller name: TORRYN FISKE  On DPR?: Yes  Call back number: [phone] (mobile)  Provider they see: Kathyrn Drown, MD  Reason for call:Pt come by Dr Nicki Reaper sent her to allergie specialist and it needs a ENT not allergies this needs to be resent

## 2022-04-05 NOTE — Telephone Encounter (Signed)
ENT referral placed and pt is aware

## 2022-04-05 NOTE — Telephone Encounter (Signed)
Referral placed to ENT and pt is aware

## 2022-04-07 DIAGNOSIS — M109 Gout, unspecified: Secondary | ICD-10-CM | POA: Diagnosis not present

## 2022-04-07 DIAGNOSIS — R600 Localized edema: Secondary | ICD-10-CM | POA: Diagnosis not present

## 2022-04-08 ENCOUNTER — Encounter: Payer: Self-pay | Admitting: Family Medicine

## 2022-04-08 LAB — URIC ACID: Uric Acid: 4.4 mg/dL (ref 3.1–7.9)

## 2022-04-08 LAB — BASIC METABOLIC PANEL
BUN/Creatinine Ratio: 15 (ref 12–28)
BUN: 16 mg/dL (ref 8–27)
CO2: 25 mmol/L (ref 20–29)
Calcium: 9.5 mg/dL (ref 8.7–10.3)
Chloride: 100 mmol/L (ref 96–106)
Creatinine, Ser: 1.09 mg/dL — ABNORMAL HIGH (ref 0.57–1.00)
Glucose: 94 mg/dL (ref 70–99)
Potassium: 3.9 mmol/L (ref 3.5–5.2)
Sodium: 144 mmol/L (ref 134–144)
eGFR: 53 mL/min/{1.73_m2} — ABNORMAL LOW (ref >59)

## 2022-04-08 LAB — MAGNESIUM: Magnesium: 1.9 mg/dL (ref 1.6–2.3)

## 2022-04-11 ENCOUNTER — Other Ambulatory Visit: Payer: Self-pay | Admitting: Family Medicine

## 2022-04-19 ENCOUNTER — Ambulatory Visit: Payer: Medicare Other | Admitting: Cardiology

## 2022-04-19 NOTE — Progress Notes (Unsigned)
Cardiology Office Note Date:  04/20/2022  Patient ID:  Olean, [place] 17-Sep-1945, MRN [id number] PCP:  Kathyrn Drown, MD  Electrophysiologist: Dr. Rayann Heman (2019)  >> Dr. Quentin Ore   Chief Complaint:  6 mon follow-up  History of Present Illness: SHAYE ELLING is a 76 y.o. female with history of HTN, HLD, AFlutter (ablated), SVT/Atach.  She saw Dr. Quentin Ore 07/20/21, doing well, some intermittent SOB reported.  Started on metoprolol for her Atach, planned for 3 mo visit with an APP, a year with him.  She saw Jonni Sanger 10/11/21, doing well, no SOB, some nonspecific fatigue, reported some brief palpitations/skipped beats after being out in the sun. She had PACs and PVCs on her rhythm strip this visit and her Toprol was increased to '50mg'$  daily.  Today, she reports still having some intermittent palpitations. Does not notice a difference since the metop increased to '50mg'$  daily. Her specifically recalls episodes of SOB and palpitation symptoms have happened while sunbathing while lying on stomach, and when she is trying to reel a fish in [place] the [place]. She also has minimal symptoms during the day. All the episodes are very short (minutes) and resolve with cooling off indoors and rest. She is able to do ADLs, grocery shop without symptoms.   She has had many gout flares in R foot this past year. She sleeps in a recliner d/t gout flares to avoid items touching affected foot.   She denies chest pain, syncope, presyncope, N/V, unexplained edema. She has some baseline lower extremity edema R > L, managed with HCTZ.   Arrhythmias/AAD hx AFlutter ablation 2019 SVT/ATach noted on Monitor Jan 2023   Past Medical History:  Diagnosis Date   Arthritis    Complication of anesthesia    Gall stones    GERD (gastroesophageal reflux disease)    History of cardiac catheterization 2005   Minor elevation in cardiac enzymes however no significant obstructive CAD   Hyperlipidemia    Hypertension     Insomnia    PONV (postoperative nausea and vomiting)    Prediabetes     Past Surgical History:  Procedure Laterality Date   A-FLUTTER ABLATION N/A 08/01/2017   Procedure: A-FLUTTER ABLATION;  Surgeon: Thompson Grayer, MD;  Location: [facility];  Service: Cardiovascular;  Laterality: N/A;   ABDOMINAL HYSTERECTOMY     BACK SURGERY  1992   CARDIAC CATHETERIZATION     BACK IN 2004  SHE THINKS IT CAME BACK 'NORMAL'   CHOLECYSTECTOMY N/A 06/24/2015   Procedure: LAPAROSCOPIC CHOLECYSTECTOMY;  Surgeon: Mickeal Skinner, MD;  Location: [facility];  Service: General;  Laterality: N/A;   COLONOSCOPY N/A 08/30/2012   Procedure: COLONOSCOPY;  Surgeon: Rogene Houston, MD;  Location: [facility];  Service: Endoscopy;  Laterality: N/A;  [place]     ESOPHAGOGASTRODUODENOSCOPY  04/15/2011   Procedure: ESOPHAGOGASTRODUODENOSCOPY (EGD);  Surgeon: Rogene Houston, MD;  Location: [facility];  Service: Endoscopy;  Laterality: N/A;  11:30   ESOPHAGOGASTRODUODENOSCOPY N/A 05/27/2016   Procedure: ESOPHAGOGASTRODUODENOSCOPY (EGD);  Surgeon: Rogene Houston, MD;  [facility]: [facility];  Service: Endoscopy;  Laterality: N/A;  11:15   EYE SURGERY     cataract removal   NECK SURGERY  12/13/2018   REVERSE SHOULDER ARTHROPLASTY Left 04/03/2019   Procedure: REVERSE SHOULDER ARTHROPLASTY;  Surgeon: Hiram Gash, MD;  Location: [facility];  Service: Orthopedics;  Laterality: Left;   VAGINAL  HYSTERECTOMY      Current Outpatient Medications  Medication Sig Dispense Refill   allopurinol (ZYLOPRIM) 100 MG tablet TAKE 1 TABLET (100 MG TOTAL) BY MOUTH DAILY. 90 tablet 3   ALPRAZolam (XANAX) 1 MG tablet Take 1 tablet (1 mg total) by mouth at bedtime as needed. 90 tablet 1   Biotin 5000 MCG TABS Take 5,000 mcg by mouth daily.     colchicine 0.6 MG tablet Day 1: 1.2 mg at the first sign of flare, followed by 0.6 mg after 1 hour; Day 2 and thereafter: 0.6 mg twice daily until flare  resolves. 30 tablet 2   cyclobenzaprine (FLEXERIL) 10 MG tablet Take 1 tablet (10 mg total) by mouth at bedtime as needed for muscle spasms. 30 tablet 2   esomeprazole (NEXIUM) 40 MG capsule TAKE ONE CAPSULE ('40MG'$  TOTAL) BY MOUTH DAILY AT 12 NOON 90 capsule 3   estradiol (ESTRACE) 2 MG tablet Take 2 mg by mouth daily.      finasteride (PROSCAR) 5 MG tablet Take 2.5 mg by mouth daily.   4   hydrochlorothiazide (MICROZIDE) 12.5 MG capsule Take 1 capsule (12.5 mg total) by mouth daily. 90 capsule 3   loperamide (IMODIUM A-D) 2 MG tablet Take 1 tablet (2 mg total) by mouth 4 (four) times daily as needed for diarrhea or loose stools. 30 tablet 0   metoprolol succinate (TOPROL XL) 50 MG 24 hr tablet Take 1 tablet (50 mg total) by mouth at bedtime. 90 tablet 3   Polyethyl Glycol-Propyl Glycol (LUBRICANT EYE DROPS) 0.4-0.3 % SOLN Place 1-2 drops into both eyes daily as needed (dry/irritated eyes.).     potassium chloride (KLOR-CON) 10 MEQ tablet 1 bid 180 tablet 1   Probiotic Product (PHILLIPS COLON HEALTH PO) Take 1 capsule by mouth daily as needed (irritable bowel).     rosuvastatin (CRESTOR) 20 MG tablet Take 1 tablet (20 mg total) by mouth daily. 90 tablet 3   sertraline (ZOLOFT) 50 MG tablet TAKE ONE (1) TABLET BY MOUTH EVERY DAY 90 tablet 1   No current facility-administered medications for this visit.    Allergies:   Ibuprofen and Phenobarbital   Social History:  The patient  reports that she has never smoked. She has never used smokeless tobacco. She reports that she does not drink alcohol and does not use drugs.   Family History:  The patient's family history includes Bone cancer in her mother; Cancer in her father and mother; Early death in her son; Healthy in her daughter and son; Heart disease in her brother and father; Prostate cancer in her brother and father.  ROS:  Please see the history of present illness.    All other systems are reviewed and otherwise negative.   PHYSICAL EXAM:   VS:  BP 100/70 (BP Location: Left Arm, Patient Position: Sitting, Cuff Size: Normal)   Pulse (!) 58   Ht '5\' 1"'$  (1.549 m)   Wt 134 lb (60.8 kg)   SpO2 94%   BMI 25.32 kg/m  BMI: Body mass index is 25.32 kg/m. Well nourished, well developed, in no acute distress HEENT: normocephalic, atraumatic Neck: no JVD, carotid bruits or masses Cardiac:  RRR; no significant murmurs, no rubs, or gallops Lungs:   CTA b/l, no wheezing, rhonchi or rales Abd: soft, nontender MS: no deformity or  atrophy Ext: 2+ edema BLL, R >L Skin: warm and dry, no rash Neuro:  No gross deficits appreciated Psych: euthymic mood, full affect  EKG:  Done  today and reviewed by myself shows    June 30, 2021 ZIO monitor  HR 51 - 197 bpm, average 66 bpm. 92 SVT, longest lasting 45 seconds with an average rate of 110 bpm. Occasional supraventricular ectopy, 2% Rare ventricular ectopy.   August 01, 2017 A-Flutter Ablation: CONCLUSIONS:   1. Sinus rhythm upon presentation.   2. Atrial flutter not inducible today  3. Empiric cavotricuspid isthmus ablation performed today along the cavotricuspid isthmus with complete bidirectional isthmus block achieved.   4. No inducible arrhythmias following ablation.   5. No early apparent complications.    June 30, 2017   Echo: Study Conclusions   - Left ventricle: The cavity size was normal. Wall thickness was    normal. Systolic function was normal. The estimated ejection    fraction was in the range of 55% to 60%. Wall motion was normal;    there were no regional wall motion abnormalities. Left    ventricular diastolic function parameters were normal.  - Aortic valve: Mildly calcified annulus. Trileaflet; normal    thickness leaflets. Valve area (VTI): 2.04 cm^2. Valve area    (Vmax): 2.26 cm^2. Valve area (Vmean): 2.04 cm^2.  - Technically adequate study.   Recent Labs: 11/10/2021: Hemoglobin 13.1; Platelets 383 03/23/2022: ALT 15 04/07/2022: BUN 16;  Creatinine, Ser 1.09; Magnesium 1.9; Potassium 3.9; Sodium 144  11/10/2021: Chol/HDL Ratio 2.4; Cholesterol, Total 159; HDL 66; LDL Chol Calc (NIH) 70; Triglycerides 136   Estimated Creatinine Clearance: 37.3 mL/min (A) (by C-G formula based on SCr of 1.09 mg/dL (H)).   Wt Readings from Last 3 Encounters:  04/20/22 134 lb (60.8 kg)  04/20/22 134 lb (60.8 kg)  03/28/22 136 lb 12.8 oz (62.1 kg)     Other studies reviewed: Additional studies/records reviewed today include: summarized above  ASSESSMENT AND PLAN:  H/o Aflutter s/p ablation 2019.  Feels well without recurrent AFl symptoms.   Atrial tachycardia Minimally symptomatic currently. Does not interfere with daily activities. Has some known triggers that she has identified. We discussed whether to adjust medications further and decided to continue with metoprolol at '50mg'$  daily. If symptoms worsen, become more frequent or more intense or take longer to resolve, can re-assess  HTN Well-controlled today, continue to monitor  Disposition: F/u in 6 months with Dr. Quentin Ore or APP  Current medicines are reviewed at length with the patient today.  The patient did not have any concerns regarding medicines.  Signed, Mamie Levers, NP 04/20/22 4:37 PM   [place] [id number] [phone] (office)  [phone] (fax)

## 2022-04-20 ENCOUNTER — Ambulatory Visit (INDEPENDENT_AMBULATORY_CARE_PROVIDER_SITE_OTHER): Payer: Medicare Other | Admitting: Family Medicine

## 2022-04-20 ENCOUNTER — Ambulatory Visit: Payer: Medicare Other | Attending: Cardiology | Admitting: Cardiology

## 2022-04-20 ENCOUNTER — Encounter: Payer: Self-pay | Admitting: Family Medicine

## 2022-04-20 ENCOUNTER — Encounter: Payer: Self-pay | Admitting: Physician Assistant

## 2022-04-20 VITALS — BP 100/70 | HR 58 | Ht 61.0 in | Wt 134.0 lb

## 2022-04-20 VITALS — BP 114/73 | HR 60 | Temp 97.3°F | Ht 61.0 in | Wt 134.0 lb

## 2022-04-20 DIAGNOSIS — I10 Essential (primary) hypertension: Secondary | ICD-10-CM | POA: Diagnosis not present

## 2022-04-20 DIAGNOSIS — R002 Palpitations: Secondary | ICD-10-CM

## 2022-04-20 DIAGNOSIS — I471 Supraventricular tachycardia, unspecified: Secondary | ICD-10-CM | POA: Diagnosis not present

## 2022-04-20 DIAGNOSIS — I483 Typical atrial flutter: Secondary | ICD-10-CM | POA: Diagnosis not present

## 2022-04-20 DIAGNOSIS — E7849 Other hyperlipidemia: Secondary | ICD-10-CM | POA: Diagnosis not present

## 2022-04-20 DIAGNOSIS — Z8679 Personal history of other diseases of the circulatory system: Secondary | ICD-10-CM | POA: Insufficient documentation

## 2022-04-20 DIAGNOSIS — N1831 Chronic kidney disease, stage 3a: Secondary | ICD-10-CM | POA: Diagnosis not present

## 2022-04-20 MED ORDER — POTASSIUM CHLORIDE ER 10 MEQ PO TBCR: EXTENDED_RELEASE_TABLET | ORAL | 1 refills | Status: DC

## 2022-04-20 MED ORDER — ALPRAZOLAM 1 MG PO TABS: 1.0000 mg | ORAL_TABLET | Freq: Every evening | ORAL | 1 refills | Status: DC | PRN

## 2022-04-20 MED ORDER — HYDROCHLOROTHIAZIDE 12.5 MG PO CAPS: 12.5000 mg | ORAL_CAPSULE | Freq: Every day | ORAL | 3 refills | Status: DC

## 2022-04-20 MED ORDER — ALLOPURINOL 100 MG PO TABS: ORAL_TABLET | ORAL | 3 refills | Status: DC

## 2022-04-20 NOTE — Progress Notes (Signed)
   Subjective:    Patient ID: Terri Douglas, female    DOB: Apr 07, 1946, 76 y.o.   MRN: [id number]  HPI 4 week follow up for gout and pedal edema - wearing compression socks Discuss Recent blood work  Patient is wearing knee-high surgical support stockings Trying to watch her diet Taking her medicines as directed Recently had lab work which shows chronic kidney disease but urine is negative on micro protein and creatinine is not significantly elevated. Results for orders placed or performed in visit on 10/93/23  Basic metabolic panel  Result Value Ref Range   Glucose 94 70 - 99 mg/dL   BUN 16 8 - 27 mg/dL   Creatinine, Ser 1.09 (H) 0.57 - 1.00 mg/dL   eGFR 53 (L) >59 mL/min/1.73   BUN/Creatinine Ratio 15 12 - 28   Sodium 144 134 - 144 mmol/L   Potassium 3.9 3.5 - 5.2 mmol/L   Chloride 100 96 - 106 mmol/L   CO2 25 20 - 29 mmol/L   Calcium 9.5 8.7 - 10.3 mg/dL  Magnesium  Result Value Ref Range   Magnesium 1.9 1.6 - 2.3 mg/dL  Uric Acid  Result Value Ref Range   Uric Acid 4.4 3.1 - 7.9 mg/dL     Review of Systems     Objective:   Physical Exam  General-in no acute distress Eyes-no discharge Lungs-respiratory rate normal, CTA CV-no murmurs,RRR Extremities skin warm dry no edema Neuro grossly normal Behavior normal, alert  Patient is spouse has memory issues which is becoming difficult for her to function with she was encouraged to get her spouse in with his doctor     Assessment & Plan:   1. Primary hypertension Blood pressure under good control continue current measures  2. Other hyperlipidemia Continue statin  3. Stage 3a chronic kidney disease ([place]) Under good control currently recent blood work looks much improved   Lab work in 6 months time follow-up if ongoing troubles

## 2022-04-20 NOTE — Patient Instructions (Signed)
Medication Instructions:   Your physician recommends that you continue on your current medications as directed. Please refer to the Current Medication list given to you today.   *If you need a refill on your cardiac medications before your next appointment, please call your pharmacy*   Lab Work: [place]   If you have labs (blood work) drawn today and your tests are completely normal, you will receive your results only by: [place] (if you have MyChart) OR A paper copy in the mail If you have any lab test that is abnormal or we need to change your treatment, we will call you to review the results.   Testing/Procedures: NONE ORDERED  TODAY    Follow-Up: At [facility], you and your health needs are our priority.  As part of our continuing mission to provide you with exceptional heart care, we have created designated Provider Care Teams.  These Care Teams include your primary Cardiologist (physician) and Advanced Practice Providers (APPs -  Physician Assistants and Nurse Practitioners) who all work together to provide you with the care you need, when you need it.  We recommend signing up for the patient portal called "MyChart".  Sign up information is provided on this After Visit Summary.  MyChart is used to connect with patients for Virtual Visits (Telemedicine).  Patients are able to view lab/test results, encounter notes, upcoming appointments, etc.  Non-urgent messages can be sent to your provider as well.   To learn more about what you can do with MyChart, go to [email or web address].    Your next appointment:   6 month(s)  The format for your next appointment:   In Person  Provider:   You may see Vickie Epley, MD or one of the following Advanced Practice Providers on your designated Care Team:   Tommye Standard, [place] Legrand Como "Jonni Sanger" Chalmers Cater, [place]   Other Instructions   Important Information About Sugar

## 2022-05-13 DIAGNOSIS — Z23 Encounter for immunization: Secondary | ICD-10-CM | POA: Diagnosis not present

## 2022-06-01 DIAGNOSIS — H903 Sensorineural hearing loss, bilateral: Secondary | ICD-10-CM | POA: Diagnosis not present

## 2022-06-01 DIAGNOSIS — H838X3 Other specified diseases of inner ear, bilateral: Secondary | ICD-10-CM | POA: Diagnosis not present

## 2022-07-25 ENCOUNTER — Ambulatory Visit (INDEPENDENT_AMBULATORY_CARE_PROVIDER_SITE_OTHER): Payer: Medicare Other | Admitting: Family Medicine

## 2022-07-25 VITALS — BP 153/77 | HR 77 | Wt 135.2 lb

## 2022-07-25 DIAGNOSIS — R233 Spontaneous ecchymoses: Secondary | ICD-10-CM

## 2022-07-25 DIAGNOSIS — D692 Other nonthrombocytopenic purpura: Secondary | ICD-10-CM

## 2022-07-25 DIAGNOSIS — R030 Elevated blood-pressure reading, without diagnosis of hypertension: Secondary | ICD-10-CM | POA: Diagnosis not present

## 2022-07-25 DIAGNOSIS — M151 Heberden's nodes (with arthropathy): Secondary | ICD-10-CM

## 2022-07-25 DIAGNOSIS — R7989 Other specified abnormal findings of blood chemistry: Secondary | ICD-10-CM | POA: Diagnosis not present

## 2022-07-25 DIAGNOSIS — R609 Edema, unspecified: Secondary | ICD-10-CM | POA: Diagnosis not present

## 2022-07-25 MED ORDER — HYDROCHLOROTHIAZIDE 25 MG PO TABS: 25.0000 mg | ORAL_TABLET | Freq: Every day | ORAL | 3 refills | Status: DC

## 2022-07-25 NOTE — Progress Notes (Signed)
   Subjective:    Patient ID: Terri Douglas, female    DOB: Nov 04, 1945, 77 y.o.   MRN: [id number]  HPI  Patient arrives today with left forearm swollen and bruised. Patient states tender to the touch. Patient stated bruising happen a week ago and has gradually gotten worse.  Patient with bruising on the left arm and right arm classic for senile purpura does have some swelling in the arms and little bit in the hands as well extremities no edema blood pressure is elevated   Review of Systems     Objective:   Physical Exam  See above lungs clear heart regular lower extremities no edema      Assessment & Plan:   Significant bruising Senile purpura Labs ordered Blood pressure mildly elevated Bump up dose of HCTZ Does have some swelling in the arms and in the hands Monitor closely Follow-up within 4 to 6 weeks to recheck

## 2022-07-25 NOTE — Patient Instructions (Signed)
We are increasing HCTZ to 25 mg  Recheck here in 4 to 6 weeks

## 2022-07-26 LAB — CBC WITH DIFFERENTIAL/PLATELET
Basophils Absolute: 0 10*3/uL (ref 0.0–0.2)
Basos: 1 %
EOS (ABSOLUTE): 0.1 10*3/uL (ref 0.0–0.4)
Eos: 2 %
Hematocrit: 40.7 % (ref 34.0–46.6)
Hemoglobin: 12.8 g/dL (ref 11.1–15.9)
Immature Grans (Abs): 0 10*3/uL (ref 0.0–0.1)
Immature Granulocytes: 0 %
Lymphocytes Absolute: 1.2 10*3/uL (ref 0.7–3.1)
Lymphs: 23 %
MCH: 28.7 pg (ref 26.6–33.0)
MCHC: 31.4 g/dL — ABNORMAL LOW (ref 31.5–35.7)
MCV: 91 fL (ref 79–97)
Monocytes Absolute: 0.5 10*3/uL (ref 0.1–0.9)
Monocytes: 9 %
Neutrophils Absolute: 3.3 10*3/uL (ref 1.4–7.0)
Neutrophils: 65 %
Platelets: 310 10*3/uL (ref 150–450)
RBC: 4.46 x10E6/uL (ref 3.77–5.28)
RDW: 12.6 % (ref 11.7–15.4)
WBC: 5 10*3/uL (ref 3.4–10.8)

## 2022-07-26 LAB — BASIC METABOLIC PANEL
BUN/Creatinine Ratio: 12 (ref 12–28)
BUN: 11 mg/dL (ref 8–27)
CO2: 27 mmol/L (ref 20–29)
Calcium: 9.3 mg/dL (ref 8.7–10.3)
Chloride: 100 mmol/L (ref 96–106)
Creatinine, Ser: 0.91 mg/dL (ref 0.57–1.00)
Glucose: 88 mg/dL (ref 70–99)
Potassium: 4.8 mmol/L (ref 3.5–5.2)
Sodium: 141 mmol/L (ref 134–144)
eGFR: 66 mL/min/{1.73_m2} (ref >59)

## 2022-07-26 LAB — PROTIME-INR
INR: 1 (ref 0.9–1.2)
Prothrombin Time: 10.6 s (ref 9.1–12.0)

## 2022-08-22 ENCOUNTER — Ambulatory Visit (INDEPENDENT_AMBULATORY_CARE_PROVIDER_SITE_OTHER): Payer: Medicare Other | Admitting: Family Medicine

## 2022-08-22 ENCOUNTER — Encounter: Payer: Self-pay | Admitting: Family Medicine

## 2022-08-22 VITALS — BP 136/74 | Wt 131.8 lb

## 2022-08-22 DIAGNOSIS — R7989 Other specified abnormal findings of blood chemistry: Secondary | ICD-10-CM

## 2022-08-22 DIAGNOSIS — I10 Essential (primary) hypertension: Secondary | ICD-10-CM

## 2022-08-22 DIAGNOSIS — E7849 Other hyperlipidemia: Secondary | ICD-10-CM | POA: Diagnosis not present

## 2022-08-22 DIAGNOSIS — Z1389 Encounter for screening for other disorder: Secondary | ICD-10-CM

## 2022-08-22 DIAGNOSIS — K219 Gastro-esophageal reflux disease without esophagitis: Secondary | ICD-10-CM | POA: Diagnosis not present

## 2022-08-22 MED ORDER — POTASSIUM CHLORIDE ER 10 MEQ PO TBCR: EXTENDED_RELEASE_TABLET | ORAL | 1 refills | Status: DC

## 2022-08-22 MED ORDER — ROSUVASTATIN CALCIUM 20 MG PO TABS: 20.0000 mg | ORAL_TABLET | Freq: Every day | ORAL | 3 refills | Status: DC

## 2022-08-22 MED ORDER — ESOMEPRAZOLE MAGNESIUM 40 MG PO CPDR: DELAYED_RELEASE_CAPSULE | ORAL | 3 refills | Status: DC

## 2022-08-22 MED ORDER — METOPROLOL SUCCINATE ER 50 MG PO TB24: 50.0000 mg | ORAL_TABLET | Freq: Every day | ORAL | 3 refills | Status: DC

## 2022-08-22 MED ORDER — ALPRAZOLAM 1 MG PO TABS: 1.0000 mg | ORAL_TABLET | Freq: Every evening | ORAL | 1 refills | Status: DC | PRN

## 2022-08-22 MED ORDER — SERTRALINE HCL 50 MG PO TABS: ORAL_TABLET | ORAL | 1 refills | Status: DC

## 2022-08-22 NOTE — Patient Instructions (Signed)
Stable to try to get

## 2022-08-22 NOTE — Progress Notes (Signed)
   Subjective:    Patient ID: Terri Douglas, female    DOB: May 28, 1946, 77 y.o.   MRN: [id number]  HPI Here today for follow-up regarding blood pressure Takes her medicine regular basis Eating healthier Portion control Fitting in activity Blood pressure under good control Denies chest pain pressure pain or shortness of breath   Review of Systems     Objective:   Physical Exam  General-in no acute distress Eyes-no discharge Lungs-respiratory rate normal, CTA CV-no murmurs,RRR Extremities skin warm dry no edema Neuro grossly normal Behavior normal, alert       Assessment & Plan:  Overall health doing better Blood pressure doing well Continue current measures Patient states she will continue to eat healthy and stay physically active No lab work indicated today Patient will do follow-up office visit in July lab work before that visit

## 2022-08-23 ENCOUNTER — Encounter (INDEPENDENT_AMBULATORY_CARE_PROVIDER_SITE_OTHER): Payer: Self-pay | Admitting: *Deleted

## 2022-08-24 NOTE — Addendum Note (Signed)
Addended by: Dairl Ponder on: 08/24/2022 04:19 PM   Modules accepted: Orders

## 2022-08-26 DIAGNOSIS — D2271 Melanocytic nevi of right lower limb, including hip: Secondary | ICD-10-CM | POA: Diagnosis not present

## 2022-08-26 DIAGNOSIS — D2261 Melanocytic nevi of right upper limb, including shoulder: Secondary | ICD-10-CM | POA: Diagnosis not present

## 2022-08-26 DIAGNOSIS — L814 Other melanin hyperpigmentation: Secondary | ICD-10-CM | POA: Diagnosis not present

## 2022-08-26 DIAGNOSIS — D224 Melanocytic nevi of scalp and neck: Secondary | ICD-10-CM | POA: Diagnosis not present

## 2022-08-26 DIAGNOSIS — L821 Other seborrheic keratosis: Secondary | ICD-10-CM | POA: Diagnosis not present

## 2022-08-26 DIAGNOSIS — L578 Other skin changes due to chronic exposure to nonionizing radiation: Secondary | ICD-10-CM | POA: Diagnosis not present

## 2022-08-26 DIAGNOSIS — Z85828 Personal history of other malignant neoplasm of skin: Secondary | ICD-10-CM | POA: Diagnosis not present

## 2022-08-26 DIAGNOSIS — D225 Melanocytic nevi of trunk: Secondary | ICD-10-CM | POA: Diagnosis not present

## 2022-09-13 ENCOUNTER — Ambulatory Visit (HOSPITAL_COMMUNITY)
Admission: RE | Admit: 2022-09-13 | Discharge: 2022-09-13 | Disposition: A | Discharge status: Home / Self Care | Payer: Medicare Other | Source: Ambulatory Visit | Attending: Family Medicine | Admitting: Family Medicine

## 2022-09-13 ENCOUNTER — Ambulatory Visit (INDEPENDENT_AMBULATORY_CARE_PROVIDER_SITE_OTHER): Payer: Medicare Other | Admitting: Family Medicine

## 2022-09-13 ENCOUNTER — Other Ambulatory Visit (HOSPITAL_COMMUNITY): Payer: Self-pay | Admitting: Family Medicine

## 2022-09-13 VITALS — BP 114/73 | HR 65 | Ht 61.0 in | Wt 132.8 lb

## 2022-09-13 DIAGNOSIS — R600 Localized edema: Secondary | ICD-10-CM

## 2022-09-13 DIAGNOSIS — M79662 Pain in left lower leg: Secondary | ICD-10-CM

## 2022-09-13 DIAGNOSIS — M7918 Myalgia, other site: Secondary | ICD-10-CM | POA: Diagnosis not present

## 2022-09-13 DIAGNOSIS — M7989 Other specified soft tissue disorders: Secondary | ICD-10-CM | POA: Diagnosis not present

## 2022-09-13 NOTE — ED Provider Notes (Signed)
[facility] DEPARTMENT    Time of Arrival:   09/13/22 1156        Final diagnoses:   [R00.0] Tachycardia (Primary)       Medical Decision Making:     Differential/Questionable Diagnosis:    Cardiac dysrhythmia, dehydration, PE, pneumonia, atrial fibrillation, atrial flutter, hypothyroid, etc.    Social determinants: social factors reviewed, did not limit treatment                                Glasgow Coma Scale Score: 15               Supplemental Historians include:  patient     ED Course: Very well-appearing asymptomatic 77 year old female here with tachycardia and elevated blood pressure.  Patient with no chest pain and no palpitations.  No shortness of breath, diaphoresis or other anginal equivalent type symptoms.  Potassium 3.2, this was replaced.  TSH normal at 3.2.  Initial troponin of 13, repeat check of 19, recheck of 28.    Given the lack of any symptoms or ischemic changes on EKG this is felt to be secondary to her tachycardia.  During her stay here she spontaneously converted back to normal heart rate  Discussed with cardiology PA and patient presumably was in cardiac dysrhythmia most likely atrial flutter.    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 Recommend imaging follow-up with contrast-enhanced MRI or pancreas-protocol CT every 2 years for 10 years. Consider stopping follow-up at age 20, depending on co-morbidities and patient preferences.     Notified pt and family of pancreas findings on CT     With patient completely asymptomatic, nonischemic EKG, elevated troponin likely secondary to elevated rate.  Patient spontaneously converted and therefore feel that no further workup needed for this elevated troponin.  Noted that trop is increased   However, elevated trop is related to elevated rate  Rate no longer elevated after spont conversion      Patient does however need cardiac monitor.  No availability to place this tonight.  Will have the patient come back tomorrow for evaluation in the ER and placement of a Zio patch.  I discussed with cardiology and they will have somebody follow-up on this.  Patient's CHA2DS2-VASc score would be 4 therefore cardiology recommends starting on Eliquis and Lopressor.       As of 09/13/2022, 5:07 PM  The differential diagnosis and / or critical care lists were considered including infections, sepsis, severe sepsis, and septic shock and found unlikely unless otherwise documented in the final clinical impression or diagnosis list.       Documentation/Prior Results Review:    Old medical records:  previous admission, discharges, office notes, labs, imaging       Rhythm interpretation from monitor: sinus tachycardia  2nd EKG - NSR   No ischemia on either     Imaging Interpreted by me: CT nothing acute     EKG 12 LEAD UNIT PERFORMED   Final Result      CT CTA CHEST PULMONARY   Final Result      No evidence of pulmonary embolism or other acute process.      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 Recommend imaging follow-up with contrast-enhanced MRI or pancreas-protocol CT every 2 years for 10 years. Consider stopping follow-up at age 67, depending on co-morbidities and patient preferences.               Signed By: Elita Boone, MD on 09/13/2022 4:17 PM         CHEST PORTABLE   Final Result      No active cardiopulmonary disease.      Signed By: Lorriane Shire, MD on 09/13/2022 2:56 PM         EKG 12 LEAD UNIT PERFORMED   Final Result      SINGLE LEAD 14 DAY MONITOR    (Results Pending)       .     Discussion of Management with other Physicians, QHP or Appropriate Source:   Consultant card PA     .    Disposition:  Home    Discharge  Medication List as of 09/13/2022  5:29 PM        START taking these medications    Details   apixaban (ELIQUIS) 5 mg PO TABS Take 1 Tab by Mouth Twice Daily., Disp-60 Tab, R-0, Normal      metoprolol XL (TOPROL XL) 25 mg PO TB24 Take 1 Tab by Mouth Once a Day., Disp-30 Tab, R-0, Normal           Chief Complaint   Patient presents with   . PALPITATIONS   . SHORTNESS OF BREATH       77 yo F with hx of HTN   Went to PMD office today and was found to have elevated heart rate as well as elevated blood pressure.  Patient has no complaints.  States this was a normal prescheduled primary care physician checkup.  She has no palpitations, chest pain, shortness of breath, lower extremity edema.  No abdominal pain, nausea vomiting, diarrhea, fevers or chills or any other complaints whatsoever.  Says that occasionally her ankles swell but they look pretty good today.           Review of Systems:  Constitutional:  Negative for fever and chills.   HENT:  Negative for sore throat and neck pain.    Eyes:  Negative for redness.   Respiratory:  Negative for cough and shortness of breath.    Cardiovascular:  Negative for chest pain.   Gastrointestinal:  Negative for nausea, vomiting, abdominal pain and diarrhea.   Genitourinary:  Negative for dysuria.   Musculoskeletal:  Negative for back pain.   Skin:  Negative for wound.   Neurological:  Negative for headaches.   Hematological: negative.  Negative for adenopathy.   Psychiatric/Behavioral:  Negative for agitation.        Physical Exam  Vitals and nursing note reviewed.   Constitutional:       General: She is not in acute distress.     Appearance: She is well-developed. She is not diaphoretic.   HENT:      Head: Normocephalic and atraumatic.   Eyes:      Pupils: Pupils are equal, round, and reactive to light.   Neck:      Trachea: No tracheal deviation.   Cardiovascular:      Rate and Rhythm: Regular rhythm. Tachycardia  present.      Heart sounds: Normal heart sounds. No murmur  heard.  Pulmonary:      Effort: Pulmonary effort is normal. No respiratory distress.      Breath sounds: Normal breath sounds. No stridor.   Abdominal:      General: There is no distension.      Palpations: Abdomen is soft.      Tenderness: There is no abdominal tenderness.   Musculoskeletal:         General: No deformity. Normal range of motion.      Cervical back: Normal range of motion.   Skin:     General: Skin is warm and dry.      Capillary Refill: Capillary refill takes less than 2 seconds.      Findings: No rash.   Neurological:      Mental Status: She is alert and oriented to person, place, and time.      Cranial Nerves: No cranial nerve deficit.      Motor: No abnormal muscle tone.      Coordination: Coordination normal.       No past medical history on file.  No past surgical history on file.  No family history on file.  Social History     Occupational History   . Not on file   Tobacco Use   . Smoking status: Not on file   . Smokeless tobacco: Not on file   Substance and Sexual Activity   . Alcohol use: Not on file   . Drug use: Not on file   . Sexual activity: Not on file     Outpatient Medications Marked as Taking for the 09/13/22 encounter [facility] Encounter)   Medication Sig Dispense Refill   . apixaban (ELIQUIS) 5 mg PO TABS Take 1 Tab by Mouth Twice Daily. 60 Tab 0   . metoprolol XL (TOPROL XL) 25 mg PO TB24 Take 1 Tab by Mouth Once a Day. 30 Tab 0     Allergies   Allergen Reactions   . Hydromorphone gi distress       Vital Signs:  Patient Vitals for the past 72 hrs:   Temp Heart Rate Pulse Resp BP BP Mean SpO2 Weight   09/13/22 1615 -- 74 73 23 196/107 (!) 129 MM HG 97 % --   09/13/22 1600 -- 74 74 13 184/96 (!) 121 MM HG 95 % --   09/13/22 1500 -- 127 127 17 (!) 180/118 (!) 136 MM HG 95 % --   09/13/22 1430 -- 132 132 15 (!) 162/120 (!) 135 MM HG 96 % --   09/13/22 1330 -- 128 128 18 (!) 215/130 (!) 144 MM HG 95 % --   09/13/22 1300 -- 128 127 25 (!) 222/126 (!) 149 MM HG 95 % --   09/13/22 1230 --  131 131 17 (!) 223/126 (!) 151 MM HG 94 % --   09/13/22 1215 -- 132 132 19 (!) 219/134 (!) 158 MM HG 98 % --   09/13/22 1200 98.3 F (36.8 C) 135 -- 18 (!) 211/117 (!) 148 MM HG 95 % 90.5 kg (199 lb 9.6 oz)       Diagnostics:  Labs:    Results for orders placed or performed during the hospital encounter of 09/13/22   BASIC METABOLIC PANEL   Result Value Ref Range    Potassium 3.2 (L) 3.5 - 5.5 mmol/L    Sodium 137 133 - 145 mmol/L  Chloride 95 (L) 98 - 110 mmol/L    Glucose 110 (H) 70 - 99 mg/dL    Calcium 16.1 8.4 - 09.6 mg/dL    BUN 22 6 - 22 mg/dL    Creatinine 1.0 0.8 - 1.4 mg/dL    CO2 25 20 - 32 mmol/L    eGFR 55.4 (L) >60.0 mL/min/1.73 sq.m.    Anion Gap 17.0 (H) 3.0 - 15.0 mmol/L   CBC WITH DIFFERENTIAL AUTO   Result Value Ref Range    WBC 8.0 4.0 - 11.0 K/uL    RBC 4.93 3.80 - 5.20 M/uL    HGB 15.1 11.7 - 16.1 g/dL    HCT 04.5 40.9 - 81.1 %    MCV 88 80 - 99 fL    MCH 31 26 - 34 pg    MCHC 35 31 - 36 g/dL    RDW 91.4 78.2 - 95.6 %    Platelet 218 140 - 440 K/uL    MPV 10.1 9.0 - 13.0 fL    Segmented Neutrophils (Auto) 65 40 - 75 %    Lymphocytes (Auto) 25 20 - 45 %    Monocytes (Auto) 6 3 - 12 %    Eosinophils (Auto) 3 0 - 6 %    Basophils (Auto) 1 0 - 2 %    Absolute Neutrophils (Auto) 5.2 1.8 - 7.7 K/uL    Absolute Lymphocytes (Auto) 2.0 1.0 - 4.8 K/uL    Absolute Monocytes (Auto) 0.5 0.1 - 1.0 K/uL    Absolute Eosinophils (Auto) 0.2 0.0 - 0.5 K/uL    Absolute Basophils (Auto) 0.0 0.0 - 0.2 K/uL   TSH   Result Value Ref Range    TSH 3.22 0.27 - 4.20 mcU/mL   MAGNESIUM SERUM   Result Value Ref Range    Magnesium 1.9 1.6 - 2.5 mg/dL   D-DIMER QUANTITATIVE   Result Value Ref Range    D-Dimer 1.33 (H) 0.00 - 1.12 mg/L FEU   Troponin Care Path   Result Value Ref Range    Troponin (T) Quant High Sensitivity (5th Gen) 13 0 - 19 ng/L   NT PROBNP   Result Value Ref Range    NT proBNP 59 <=450 pg/mL   Troponin 1 HR   Result Value Ref Range    Troponin (T) Quant High Sensitivity (5th Gen) 19 0 - 19 ng/L    TROPONIN   Result Value Ref Range    Troponin (T) Quant High Sensitivity (5th Gen) 28 (H) 0 - 19 ng/L     ECG:  Results for orders placed or performed during the hospital encounter of 09/13/22   EKG 12 LEAD UNIT PERFORMED   Result Value Ref Range Status    Heart Rate 73 bpm Final    RR Interval 824 ms Final    Atrial Rate 74 ms Final    P-R Interval 177 ms Final    P Duration 118 ms Final    P Horizontal Axis -15 deg Final    P Front Axis 52 deg Final    Q Onset 502 ms Final    QRSD Interval 79 ms Final    QT Interval 387 ms Final    QTcB 426 ms Final    QTcF 413 ms Final    QRS Horizontal Axis 245 deg Final    QRS Axis -51 deg Final    I-40 Front Axis 5 deg Final    t-40 Horizontal Axis 226 deg Final  T-40 Front Axis -66 deg Final    T Horizontal Axis 6 deg Final    T Wave Axis 24 deg Final    S-T Horizontal Axis 22 deg Final    S-T Front Axis 56 deg Final    Impression - ABNORMAL ECG -  Final    Impression SR-Sinus rhythm-normal P axis, V-rate 50-99  Final    Impression   Final     APC-Atrial premature complex-SV complex w/ short R-R interval    Impression   Final     LAFB-Left anterior fascicular block-axis(240,-40), init forces inf    Impression   Final     LVOLP-Low voltage, precordial leads-precordial leads <1.54mV    Impression AMIC-Consider anterior infarct-Q >92mS in V2-V5  Final    Impression -rate improved-  Final   EKG 12 LEAD UNIT PERFORMED   Result Value Ref Range Status    Heart Rate 138 bpm Final    RR Interval 432 ms Final    Atrial Rate 139 ms Final    P-R Interval 164 ms Final    P Duration 93 ms Final    P Horizontal Axis 172 deg Final    P Front Axis 223 deg Final    Q Onset 499 ms Final    QRSD Interval 76 ms Final    QT Interval 312 ms Final    QTcB 475 ms Final    QTcF 412 ms Final    QRS Horizontal Axis 234 deg Final    QRS Axis -70 deg Final    I-40 Front Axis -7 deg Final    t-40 Horizontal Axis 228 deg Final    T-40 Front Axis 269 deg Final    T Horizontal Axis -44 deg Final    T Wave  Axis  deg Final    S-T Horizontal Axis 198 deg Final    S-T Front Axis 217 deg Final    Impression - ABNORMAL ECG -  Final    Impression   Final     EAT-Ectopic atrial tachycardia, unifocal-abnormal P axis, V-rate>99    Impression IMIO-Inferior infarct, old-Q >62mS, II III aVF  Final    Impression   Final     AMIQP-Probable anterior infarct, age indeterminate-Q >74mS, T neg, V2-V5    Impression NOECG-No ECG for comparison-  Final            Medications ordered/given in the ED  Medications   potassium chloride (Klor-Con) packet 40 mEq (40 mEq Oral Given 09/13/22 1453)   apixaban (Eliquis) tablet 5 mg (5 mg Oral Given 09/13/22 1757)   metoprolol (Lopressor) tablet 25 mg (25 mg Oral Given 09/13/22 1757)

## 2022-09-13 NOTE — Progress Notes (Signed)
   Subjective:    Patient ID: Terri Douglas, female    DOB: 10-15-1945, 77 y.o.   MRN: [id number]  HPI Patient arrives today with swelling left calf, left buttocks pain. Patient also states she is having neck pain on the left side as well. Patient states she fell 2 1/2 weeks ago. Left calf slightly tender on the outside approximately half inch bigger than the right calf patient also describes buttock pain as well as pain radiating into the leg   Review of Systems     Objective:   Physical Exam  General-in no acute distress Eyes-no discharge Lungs-respiratory rate normal, CTA CV-no murmurs,RRR Extremities skin warm dry no edema Neuro grossly normal Behavior normal, alert       Assessment & Plan:  Because of the calf pain we went ahead with doing a ultrasound to rule out a DVT Ultrasound came back negative D-dimer came back negative Is quite possible this could be sciatica as well Patient was encouraged to follow-up if not improving over the course the next 7 to 10 days

## 2022-09-14 DIAGNOSIS — M79662 Pain in left lower leg: Secondary | ICD-10-CM | POA: Diagnosis not present

## 2022-09-14 DIAGNOSIS — R600 Localized edema: Secondary | ICD-10-CM | POA: Diagnosis not present

## 2022-09-14 DIAGNOSIS — M7918 Myalgia, other site: Secondary | ICD-10-CM | POA: Diagnosis not present

## 2022-09-14 NOTE — Procedures (Addendum)
Cardiac Event Monitor    Findings:    Patient had a min HR of 41 bpm, max HR of 200 bpm, and avg HR of 62 bpm.  Predominant underlying rhythm was Sinus Rhythm. 167 Supraventricular  Tachycardia runs occurred, the run with the fastest interval lasting 2 mins 56 secs  with a max rate of 200 bpm, the longest lasting 45 mins 1 sec with an avg rate of  133 bpm. Idioventricular Rhythm was present. Supraventricular Tachycardia was  detected within +/- 45 seconds of symptomatic patient event(s). Isolated SVEs were  occasional (1.0%, 12320), SVE Couplets were rare (<1.0%, 847), and SVE Triplets  were rare (<1.0%, 297). Isolated VEs were rare (<1.0%), VE Couplets were rare  (<1.0%), and no VE Triplets were present.      Final Physician Impression:    Sinus rhythm predominantly.  PACs 1%. Rare PVCs.  SVT was seen - with sustained episodes, up to 45 min. These are most consistent with atrial tachycardia with rates typically less than 150 bpm.  No heart block.  No triggered event but 1 diary noted which was associated with SVT/AT.      Physician Signature:    Trula Ore, MD

## 2022-09-14 NOTE — ED Provider Notes (Signed)
[facility] [place] EMERGENCY DEPARTMENT    Time of Arrival:   09/14/22 0710      Final diagnoses:   [R00.2] Palpitations (Primary)   [I10] Primary hypertension       Medical Decision Making/ED Course:    Krystal Hood is a 77 y.o. female with PMHx of HTN presenting to the ED today for a cardiac monitor to be applied.      Differential/Questionable Diagnosis:     Symptoms concerning for arrhythmia    Patient presented to the ED afebrile, hypertensive to 186/79, otherwise hemodynamically stable, satting well on room air  EKG with sinus rhythm, rate of 65, no acute ST segment changes, unchanged compared to prior  No indication for labs or imaging at this time  Cardiac monitor ordered and applied by echo tech    Will discharge patient home with recommendations to follow-up with PMD within 2 days, cardiology specialist as scheduled, and to return to the ED for any worsening or concerning symptoms.    Social determinants: social factors reviewed, did not limit treatment                                Glasgow Coma Scale Score: 15               Supplemental Historians include:  patient, spouse, and medical records                 Documentation/Prior Results Review:    Old medical records:  Previous ED visit on 09/13/2022 for palpitations, Previous electrocardiograms:  Compared to EKG performed 09/13/2022, Nursing notes:  ED triage nurse reports patient is here for Holter monitor to be placed, Previous radiology studies:  CTA chest performed yesterday with no evidence of PE    Rhythm interpretation from monitor: N/A    Imaging Interpreted by me: Not Applicable    EKG 12-LEAD         SINGLE LEAD 14 DAY MONITOR    (Results Pending)       .     Discussion of Management with other Physicians, QHP or Appropriate Source:   None    .    Disposition:  Home    New Prescriptions    No medications on file     Chief Complaint   Patient presents with   . MEDICAL PROBLEM       Krystal Hood is a 77 y.o. female with PMHx of HTN  presenting to the ED today for a cardiac monitor to be applied.  Patient was referred to the ED yesterday by her PMD who found her to have an elevated heart rate.  There was concern for atrial flutter, however, patient spontaneously converted to normal sinus rhythm.  Workup was unremarkable.  Discussions with cardiology recommended patient be discharged home with Eliquis and metoprolol and have a cardiac monitor placed.  The echo team had already left for the day and patient was instructed to return to the ED today for heart monitor to be applied.  Since discharge from the ED, patient denies any recurrent palpitations, chest pain, shortness of breath, abdominal pain, nausea, vomiting, abnormal extremity pain or swelling.              Physical Exam  Vitals and nursing note reviewed.   Constitutional:       Comments: Elderly Caucasian female.  No acute distress.  Accompanied by husband.   HENT:  Head: Normocephalic and atraumatic.   Eyes:      Conjunctiva/sclera: Conjunctivae normal.   Cardiovascular:      Rate and Rhythm: Normal rate and regular rhythm.      Heart sounds: Normal heart sounds.   Pulmonary:      Effort: Pulmonary effort is normal. No respiratory distress.      Breath sounds: Normal breath sounds. No wheezing or rales.   Abdominal:      General: There is no distension.      Palpations: Abdomen is soft.      Tenderness: There is no abdominal tenderness.   Musculoskeletal:         General: Normal range of motion.      Cervical back: Normal range of motion.      Comments: Mild bilateral lower extremity swelling at baseline per patient   Skin:     General: Skin is warm and dry.      Findings: No erythema or rash.   Neurological:      General: No focal deficit present.      Mental Status: She is alert.   Psychiatric:         Mood and Affect: Mood normal.         No past medical history on file.  No past surgical history on file.  No family history on file.  Social History     Occupational History   . Not  on file   Tobacco Use   . Smoking status: Not on file   . Smokeless tobacco: Not on file   Substance and Sexual Activity   . Alcohol use: Not on file   . Drug use: Not on file   . Sexual activity: Not on file     No outpatient medications have been marked as taking for the 09/14/22 encounter [facility] Encounter).     Allergies   Allergen Reactions   . Hydromorphone gi distress       Vital Signs:  Patient Vitals for the past 72 hrs:   Temp Heart Rate Resp BP BP Mean SpO2 Weight   09/14/22 0712 97.9 F (36.6 C) 70 18 186/79 (!) 115 MM HG 98 % 90.3 kg (199 lb)       Diagnostics:  Labs:  No results found for this visit on 09/14/22.  ECG:  Results for orders placed or performed during the hospital encounter of 09/14/22   EKG 12-LEAD   Result Value Ref Range Status    Heart Rate 65 bpm Preliminary    RR Interval 920 ms Preliminary    Atrial Rate 65 ms Preliminary    P-R Interval 193 ms Preliminary    P Duration 119 ms Preliminary    P Horizontal Axis 8 deg Preliminary    P Front Axis 46 deg Preliminary    Q Onset 510 ms Preliminary    QRSD Interval 83 ms Preliminary    QT Interval 433 ms Preliminary    QTcB 451 ms Preliminary    QTcF 445 ms Preliminary    QRS Horizontal Axis 239 deg Preliminary    QRS Axis -42 deg Preliminary    I-40 Front Axis 5 deg Preliminary    t-40 Horizontal Axis 214 deg Preliminary    T-40 Front Axis -61 deg Preliminary    T Horizontal Axis -10 deg Preliminary    T Wave Axis 7 deg Preliminary    S-T Horizontal Axis  deg Preliminary    S-T Front Axis 36  deg Preliminary    Impression - ABNORMAL ECG -  Preliminary    Impression SR-Sinus rhythm-normal P axis, V-rate 50-99  Preliminary    Impression   Preliminary     LAFB-Left anterior fascicular block-axis(240,-40), init forces inf    Impression   Preliminary     LVOLP-Low voltage, precordial leads-precordial leads <1.18mV    Impression LVHV-LVH by voltage-R >1.10 in aVL  Preliminary    Impression   Preliminary     AQLVH-Anterior Q waves, possibly due  to LVH-Q >35mS in V2-V5 & LVH            Medications ordered/given in the ED  Medications - No data to display

## 2022-09-15 LAB — D-DIMER, QUANTITATIVE: D-DIMER: 0.38 mg/L FEU (ref 0.00–0.49)

## 2022-09-19 ENCOUNTER — Ambulatory Visit: Payer: Medicare Other | Admitting: Family Medicine

## 2022-12-19 ENCOUNTER — Ambulatory Visit: Payer: Medicare Other | Admitting: Family Medicine

## 2022-12-19 VITALS — BP 144/76 | HR 67 | Wt 132.8 lb

## 2022-12-19 DIAGNOSIS — D692 Other nonthrombocytopenic purpura: Secondary | ICD-10-CM | POA: Diagnosis not present

## 2022-12-19 DIAGNOSIS — E7849 Other hyperlipidemia: Secondary | ICD-10-CM

## 2022-12-19 DIAGNOSIS — I10 Essential (primary) hypertension: Secondary | ICD-10-CM

## 2022-12-19 MED ORDER — SERTRALINE HCL 100 MG PO TABS: ORAL_TABLET | ORAL | 1 refills | Status: DC

## 2022-12-19 NOTE — Progress Notes (Signed)
   Subjective:    Patient ID: CHRISTANA ANGELICA, female    DOB: Mar 14, 1946, 77 y.o.   MRN: [id number]  HPI Patient arrives today for 5 month follow up.  Patient under a lot of stress Her husband has dementia symptoms Unfortunately his husband does not feel he is having that bad of a problem so therefore he is not reaching out for assistance The majority of the symptoms fall on the patient She finds herself feeling more anxious and stressed She is unable to care for herself because she is spending so much time caring for her husband Patient would like to discuss going to two a day as needed on Xanax.      Review of Systems     Objective:   Physical Exam General-in no acute distress Eyes-no discharge Lungs-respiratory rate normal, CTA CV-no murmurs,RRR Extremities skin warm dry no edema Neuro grossly normal Behavior normal, alert    Senile purpura noted on the arms    Assessment & Plan:   Pietro Cassis will reach out to social worker to see if there is some resources we could give to the patient that might help her with the care of her husband  Blood pressure slightly elevated she needs a fit in walking regular activity-continue the HCTZ and metoprolol patient will need to have more close follow-up if progressive troubles or problems recheck in 6 to 8 weeks

## 2022-12-26 ENCOUNTER — Ambulatory Visit
Admit: 2022-12-26 | Discharge: 2022-12-26 | Payer: MEDICARE | Attending: Cardiovascular Disease | Primary: Physician Assistant

## 2022-12-26 DIAGNOSIS — I471 Supraventricular tachycardia, unspecified: Secondary | ICD-10-CM

## 2022-12-26 NOTE — Patient Instructions (Signed)
Learning About the Mediterranean Diet  What is the Mediterranean diet?     The Mediterranean diet is a style of eating rather than a diet plan. It features foods eaten in [place], [place], southern [place] and [place], and other countries along the [place] Sea. It emphasizes eating foods like fish, fruits, vegetables, beans, high-fiber breads and whole grains, nuts, and olive oil. This style of eating includes limited red meat, cheese, and sweets.  Why choose the Mediterranean diet?  A Mediterranean-style diet may improve heart health. It contains more fat than other heart-healthy diets. But the fats are mainly from nuts, unsaturated oils (such as fish oils and olive oil), and certain nut or seed oils (such as canola, soybean, or flaxseed oil). These fats may help protect the heart and blood vessels.  How can you get started on the Mediterranean diet?  Here are some things you can do to switch to a more Mediterranean way of eating.  What to eat  Eat a variety of fruits and vegetables each day, such as grapes, blueberries, tomatoes, broccoli, peppers, figs, olives, spinach, eggplant, beans, lentils, and chickpeas.  Eat a variety of whole-grain foods each day, such as oats, brown rice, and whole wheat bread, pasta, and couscous.  Eat fish at least 2 times a week. Try tuna, salmon, mackerel, lake trout, herring, or sardines.  Eat moderate amounts of low-fat dairy products, such as milk, cheese, or yogurt.  Eat moderate amounts of poultry and eggs.  Choose healthy (unsaturated) fats, such as nuts, olive oil, and certain nut or seed oils like canola, soybean, and flaxseed.  Limit unhealthy (saturated) fats, such as butter, palm oil, and coconut oil. And limit fats found in animal products, such as meat and dairy products made with whole milk. Try to eat red meat only a few times a month in very small amounts.  Limit sweets and desserts to only a few times a week. This includes sugar-sweetened drinks like soda.  The  Mediterranean diet may also include red wine with your meal--1 glass each day for women and up to 2 glasses a day for men.  Tips for eating at home  Use herbs, spices, garlic, lemon zest, and citrus juice instead of salt to add flavor to foods.  Add avocado slices to your sandwich instead of bacon.  Have fish for lunch or dinner instead of red meat. Brush the fish with olive oil, and broil or grill it.  Sprinkle your salad with seeds or nuts instead of cheese.  Cook with olive or canola oil instead of butter or oils that are high in saturated fat.  Switch from 2% milk or whole milk to 1% or fat-free milk.  Dip raw vegetables in a vinaigrette dressing or hummus instead of dips made from mayonnaise or sour cream.  Have a piece of fruit for dessert instead of a piece of cake. Try baked apples, or have some dried fruit.  Tips for eating out  Try broiled, grilled, baked, or poached fish instead of having it fried or breaded.  Ask your server to have your meals prepared with olive oil instead of butter.  Order dishes made with marinara sauce or sauces made from olive oil. Avoid sauces made from cream or mayonnaise.  Choose whole-grain breads, whole wheat pasta and pizza crust, brown rice, beans, and lentils.  Cut back on butter or margarine on bread. Instead, you can dip your bread in a small amount of olive oil.  Ask for a side   salad or grilled vegetables instead of french fries or chips.  Where can you learn more?  Go to https://www.healthwise.net/patientEd and enter O407 to learn more about "Learning About the Mediterranean Diet."  Current as of: Oct 12, 2020               Content Version: 13.5   2006-2022 [employer].   Care instructions adapted under license by [facility]. If you have questions about a medical condition or this instruction, always ask your healthcare professional. [employer]wise[employer] Inc[employer] disclaims any warranty or liability for your use of this information.

## 2022-12-26 NOTE — Progress Notes (Signed)
HISTORY OF PRESENT ILLNESS  Krystal Hood is a 77 y.o. female.    PMH HTN, OSA on CPAP  ----  CARDIAC STUDIES  ----  Heart monitor ziopatch 09/2022  SVT present with heart rate 126bpm to 140bpm  PACs occasional and PVC rare  167 SVT runs with the fastest interval lasting 2 minutes 56 seconds and max rate of 200bpm longest lasting 45 minutes 1 second with average rate of 133bpm, idioventricular rhythm.    ----  2d echo 09/2016  Stress 2D echocardiographic results  Baseline: Left ventricular size was normal. Overall left ventricular systolic   function was normal. Estimated left  ventricular ejection fraction was 60 % .     Peak stress: LV size was unchanged from the previous stage to mildly dilated   in some views. There was no change in LV  function since the previous stage with some hypokinesia in the inferior wall  ----  CTA of the chest 09/13/2022  No evidence of pulmonary embolism or other acute process.   ----  Duplex renal arteries 09/14/2016  INTERPRETATION/FINDINGS   Duplex images were obtained using 2-D gray scale, color flow, and   spectral Doppler analysis.   RENAL:   1. No significant renal artery stenosis identified, both renal   arteries visualized.   2. The right kidney measures 10.3 cm.   3. The left kidney measures 10.6 cm.   4. The renal vein is patent bilaterally.   5.  No evidence of aneurysm noted in the abdominal aorta.   ----    Denies fatigue, Denies shortness of breath, denies orthopnea, denies PND, reports sometimes some leg swelling,d enies weight gain, reports palpitations, denies syncopal episodes, state that palpitations are sporadic.            Family History   Problem Relation Age of Onset    Cancer Mother 43        lymphoma       Past Medical History:   Diagnosis Date    Hypertension     Thyroid disease     hypothyroidism    Unspecified sleep apnea     Doesn't use CPAP       Past Surgical History:   Procedure Laterality Date    KNEE ARTHROSCOPY Right     OTHER SURGICAL HISTORY      Fatty  removed from left ankle    TONSILLECTOMY AND ADENOIDECTOMY      TUBAL LIGATION         Social History     Tobacco Use    Smoking status: Former    Smokeless tobacco: Never   Substance Use Topics    Alcohol use: No       Allergies   Allergen Reactions    Hydromorphone Nausea And Vomiting    Meperidine Nausea And Vomiting       Prior to Admission medications    Medication Sig Start Date End Date Taking? Authorizing Provider   BIOTIN PO Take by mouth daily    Automatic Reconciliation, Ar   levothyroxine (SYNTHROID) 125 MCG tablet Take by mouth    Automatic Reconciliation, Ar   NIFEdipine (PROCARDIA XL) 90 MG extended release tablet Take 90 mg by mouth    Automatic Reconciliation, Ar   rosuvastatin (CRESTOR) 10 MG tablet Take 10 mg by mouth    Automatic Reconciliation, Ar       No results found for: "LIPIDPAN", "BMP", "CMP"     There were no vitals taken  for this visit.    HPI    Review of Systems   Constitutional:  Negative for activity change, appetite change, diaphoresis, fatigue and unexpected weight change.   Eyes:  Negative for visual disturbance.   Respiratory:  Negative for apnea, cough, chest tightness, shortness of breath and wheezing.    Cardiovascular:  Negative for chest pain, palpitations and leg swelling.   Gastrointestinal:  Negative for abdominal pain, blood in stool, constipation, diarrhea and nausea.   Endocrine: Negative for cold intolerance and heat intolerance.   Genitourinary:  Negative for decreased urine volume and difficulty urinating.   Musculoskeletal:  Negative for gait problem, myalgias and neck pain.   Skin:  Negative for color change, pallor and rash.   Neurological:  Negative for dizziness, syncope, facial asymmetry, speech difficulty, weakness, light-headedness and numbness.   Psychiatric/Behavioral:  Negative for confusion and sleep disturbance.          Physical Exam  Vitals reviewed.   Constitutional:       General: She is not in acute distress.     Appearance: Normal appearance. She  is not ill-appearing or diaphoretic.   HENT:      Head: Normocephalic and atraumatic.   Eyes:      General: No scleral icterus.        Right eye: No discharge.         Left eye: No discharge.      Conjunctiva/sclera: Conjunctivae normal.   Neck:      Vascular: No carotid bruit.   Cardiovascular:      Rate and Rhythm: Normal rate and regular rhythm.      Heart sounds: Normal heart sounds. No murmur heard.     No friction rub. No gallop.   Pulmonary:      Effort: Pulmonary effort is normal. No respiratory distress.      Breath sounds: Normal breath sounds. No wheezing, rhonchi or rales.   Chest:      Chest wall: No tenderness.   Abdominal:      General: Bowel sounds are normal. There is no distension.      Palpations: Abdomen is soft.      Tenderness: There is no abdominal tenderness. There is no guarding.   Musculoskeletal:         General: No swelling or tenderness.      Cervical back: Neck supple.      Right lower leg: No edema.      Left lower leg: No edema.   Skin:     General: Skin is warm and dry.      Findings: No erythema or rash.   Neurological:      Mental Status: She is alert. Mental status is at baseline.      Motor: No weakness.      Gait: Gait normal.   Psychiatric:         Mood and Affect: Mood normal.         Behavior: Behavior normal.         Thought Content: Thought content normal.         ASSESSMENT and PLAN  Krystal Hood has a reminder for a "due or due soon" health maintenance. I have asked that she contact her primary care prov  ider for follow-up on this health maintenance.    SVT - Continue with metoprolol succinate 25mg  po daily, states that palpitations well controlled.  Without atrial fibrillation present, can discontinue eliquis.  Presented picture of monitor that  shows atrial tachycardia at 127bpm that was regular from the emergency department.      HTN - Stage II pressure, continue with metoprolol succinate 25mg  po daily, continue with losartan 100mg  po daily, continue chlorthalidone 3m mg  po daily    HLD - Continue with lipitor 40mg  po daily, survey lipid panel periodically.      OSA on CPAP

## 2023-01-02 ENCOUNTER — Ambulatory Visit: Payer: MEDICARE | Primary: Physician Assistant

## 2023-01-04 ENCOUNTER — Ambulatory Visit: Admit: 2023-01-04 | Payer: MEDICARE | Primary: Physician Assistant

## 2023-01-04 DIAGNOSIS — I471 Supraventricular tachycardia, unspecified: Secondary | ICD-10-CM

## 2023-01-04 LAB — ECHO (TTE) COMPLETE (PRN CONTRAST/BUBBLE/STRAIN/3D)
Ao Root Index: 1.45 cm/m2
Aortic Root: 2.9 cm
Ascending Aorta Index: 1.75 cm/m2
Ascending Aorta: 3.5 cm
Body Surface Area: 2.05 m2
E/E' Lateral: 6.55
E/E' Ratio (Averaged): 7.77
E/E' Septal: 9
Est. RA Pressure: 3 mmHg
Fractional Shortening 2D: 30 % (ref 28–44)
IVSd: 0.9 cm (ref 0.6–0.9)
LA Volume A-L A4C: 29 mL (ref 22–52)
LA Volume A-L A4C: 31 mL (ref 22–52)
LA Volume A/L: 31 mL
LA Volume BP: 28 mL (ref 22–52)
LA Volume Index A-L A2C: 16 mL/m2 (ref 16–34)
LA Volume Index A-L A4C: 15 mL/m2 — AB (ref 16–34)
LA Volume Index A/L: 16 mL/m2 (ref 16–34)
LA Volume Index BP: 14 ml/m2 — AB (ref 16–34)
LA Volume Index MOD A2C: 15 ml/m2 — AB (ref 16–34)
LA Volume Index MOD A4C: 13 ml/m2 — AB (ref 16–34)
LA Volume MOD A2C: 29 mL (ref 22–52)
LA Volume MOD A4C: 25 mL (ref 22–52)
LV E' Lateral Velocity: 11 cm/s
LV E' Septal Velocity: 8 cm/s
LV Mass 2D Index: 61.6 g/m2 (ref 43–95)
LV Mass 2D: 123.3 g (ref 67–162)
LV RWT Ratio: 0.42
LVIDd Index: 2.15 cm/m2
LVIDd: 4.3 cm (ref 3.9–5.3)
LVIDs Index: 1.5 cm/m2
LVIDs: 3 cm
LVOT Area: 2.8 cm2
LVOT Diameter: 1.9 cm
LVOT Mean Gradient: 2 mmHg
LVOT Peak Gradient: 3 mmHg
LVOT Peak Velocity: 0.9 m/s
LVOT SV: 47.6 ml
LVOT Stroke Volume Index: 23.8 mL/m2
LVOT VTI: 16.8 cm
LVPWd: 0.9 cm (ref 0.6–0.9)
MV A Velocity: 0.82 m/s
MV E Velocity: 0.72 m/s
MV E Wave Deceleration Time: 276.3 ms
MV E/A: 0.88
RV Basal Dimension: 3.9 cm
RV Free Wall Peak S': 13 cm/s
TAPSE: 1.8 cm (ref 1.7–?)

## 2023-01-13 DIAGNOSIS — Z1389 Encounter for screening for other disorder: Secondary | ICD-10-CM | POA: Diagnosis not present

## 2023-01-13 DIAGNOSIS — I10 Essential (primary) hypertension: Secondary | ICD-10-CM | POA: Diagnosis not present

## 2023-01-13 DIAGNOSIS — R7989 Other specified abnormal findings of blood chemistry: Secondary | ICD-10-CM | POA: Diagnosis not present

## 2023-01-13 DIAGNOSIS — E7849 Other hyperlipidemia: Secondary | ICD-10-CM | POA: Diagnosis not present

## 2023-01-16 ENCOUNTER — Encounter: Payer: Self-pay | Admitting: Family Medicine

## 2023-01-16 ENCOUNTER — Ambulatory Visit (INDEPENDENT_AMBULATORY_CARE_PROVIDER_SITE_OTHER): Payer: Medicare Other | Admitting: Family Medicine

## 2023-01-16 VITALS — BP 126/66 | HR 92 | Temp 97.2°F | Ht 61.0 in | Wt 133.0 lb

## 2023-01-16 DIAGNOSIS — E7849 Other hyperlipidemia: Secondary | ICD-10-CM

## 2023-01-16 DIAGNOSIS — N1831 Chronic kidney disease, stage 3a: Secondary | ICD-10-CM | POA: Insufficient documentation

## 2023-01-16 DIAGNOSIS — I10 Essential (primary) hypertension: Secondary | ICD-10-CM | POA: Diagnosis not present

## 2023-01-16 MED ORDER — HYDROCHLOROTHIAZIDE 12.5 MG PO TABS: 12.5000 mg | ORAL_TABLET | Freq: Every day | ORAL | 1 refills | Status: DC

## 2023-01-16 MED ORDER — ALLOPURINOL 100 MG PO TABS: ORAL_TABLET | ORAL | 3 refills | Status: DC

## 2023-01-16 NOTE — Progress Notes (Signed)
   Subjective:    Patient ID: Terri Douglas, female    DOB: 1945-07-22, 77 y.o.   MRN: [id number]  HPI    Review of Systems     Objective:   Physical Exam General-in no acute distress Eyes-no discharge Lungs-respiratory rate normal, CTA CV-no murmurs,RRR Extremities skin warm dry no edema Neuro grossly normal Behavior normal, alert        Assessment & Plan:  HTN decent control Elevated creatinine if still elevated on follow-up visit we will lean toward ARB Reduce HCTZ to 12.5 daily Follow-up lab work in approximately 5 to 6 months  Moods are doing better continue the sertraline Still difficult stress situation with husband having early signs of memory/dementia issues  Flu vaccine RS vaccine in the fall  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 Jeannine Boga

## 2023-01-19 DIAGNOSIS — L570 Actinic keratosis: Secondary | ICD-10-CM | POA: Diagnosis not present

## 2023-01-19 DIAGNOSIS — L820 Inflamed seborrheic keratosis: Secondary | ICD-10-CM | POA: Diagnosis not present

## 2023-01-19 DIAGNOSIS — L821 Other seborrheic keratosis: Secondary | ICD-10-CM | POA: Diagnosis not present

## 2023-01-20 ENCOUNTER — Ambulatory Visit
Admit: 2023-01-20 | Discharge: 2023-01-20 | Payer: MEDICARE | Attending: Cardiovascular Disease | Primary: Physician Assistant

## 2023-01-20 DIAGNOSIS — E7849 Other hyperlipidemia: Secondary | ICD-10-CM

## 2023-01-20 MED ORDER — HYDRALAZINE HCL 25 MG PO TABS
25 MG | ORAL_TABLET | Freq: Four times a day (QID) | ORAL | 3 refills | Status: DC
Start: 2023-01-20 — End: 2023-08-02

## 2023-01-20 NOTE — Patient Instructions (Signed)
Learning About the Mediterranean Diet  What is the Mediterranean diet?     The Mediterranean diet is a style of eating rather than a diet plan. It features foods eaten in [place], [place], southern [place] and [place], and other countries along the [place] Sea. It emphasizes eating foods like fish, fruits, vegetables, beans, high-fiber breads and whole grains, nuts, and olive oil. This style of eating includes limited red meat, cheese, and sweets.  Why choose the Mediterranean diet?  A Mediterranean-style diet may improve heart health. It contains more fat than other heart-healthy diets. But the fats are mainly from nuts, unsaturated oils (such as fish oils and olive oil), and certain nut or seed oils (such as canola, soybean, or flaxseed oil). These fats may help protect the heart and blood vessels.  How can you get started on the Mediterranean diet?  Here are some things you can do to switch to a more Mediterranean way of eating.  What to eat  Eat a variety of fruits and vegetables each day, such as grapes, blueberries, tomatoes, broccoli, peppers, figs, olives, spinach, eggplant, beans, lentils, and chickpeas.  Eat a variety of whole-grain foods each day, such as oats, brown rice, and whole wheat bread, pasta, and couscous.  Eat fish at least 2 times a week. Try tuna, salmon, mackerel, lake trout, herring, or sardines.  Eat moderate amounts of low-fat dairy products, such as milk, cheese, or yogurt.  Eat moderate amounts of poultry and eggs.  Choose healthy (unsaturated) fats, such as nuts, olive oil, and certain nut or seed oils like canola, soybean, and flaxseed.  Limit unhealthy (saturated) fats, such as butter, palm oil, and coconut oil. And limit fats found in animal products, such as meat and dairy products made with whole milk. Try to eat red meat only a few times a month in very small amounts.  Limit sweets and desserts to only a few times a week. This includes sugar-sweetened drinks like soda.  The  Mediterranean diet may also include red wine with your meal--1 glass each day for women and up to 2 glasses a day for men.  Tips for eating at home  Use herbs, spices, garlic, lemon zest, and citrus juice instead of salt to add flavor to foods.  Add avocado slices to your sandwich instead of bacon.  Have fish for lunch or dinner instead of red meat. Brush the fish with olive oil, and broil or grill it.  Sprinkle your salad with seeds or nuts instead of cheese.  Cook with olive or canola oil instead of butter or oils that are high in saturated fat.  Switch from 2% milk or whole milk to 1% or fat-free milk.  Dip raw vegetables in a vinaigrette dressing or hummus instead of dips made from mayonnaise or sour cream.  Have a piece of fruit for dessert instead of a piece of cake. Try baked apples, or have some dried fruit.  Tips for eating out  Try broiled, grilled, baked, or poached fish instead of having it fried or breaded.  Ask your server to have your meals prepared with olive oil instead of butter.  Order dishes made with marinara sauce or sauces made from olive oil. Avoid sauces made from cream or mayonnaise.  Choose whole-grain breads, whole wheat pasta and pizza crust, brown rice, beans, and lentils.  Cut back on butter or margarine on bread. Instead, you can dip your bread in a small amount of olive oil.  Ask for a side   salad or grilled vegetables instead of french fries or chips.  Where can you learn more?  Go to https://www.healthwise.net/patientEd and enter O407 to learn more about "Learning About the Mediterranean Diet."  Current as of: Oct 12, 2020               Content Version: 13.5   2006-2022 [employer].   Care instructions adapted under license by [facility]. If you have questions about a medical condition or this instruction, always ask your healthcare professional. [employer]wise[employer] Inc[employer] disclaims any warranty or liability for your use of this information.

## 2023-01-20 NOTE — Progress Notes (Signed)
HISTORY OF PRESENT ILLNESS  Krystal Hood is a 77 y.o. female.    PMH HTN, OSA on CPAP  ----  CARDIAC STUDIES  ----  2d echo 01/04/2023    Left Ventricle: Normal left ventricular systolic function with a visually estimated EF of 55 - 60%. Left ventricle size is normal. Normal wall thickness. Normal wall motion. Normal diastolic function.    Tricuspid Valve: Unable to assess RVSP due to inadequate or insignificant tricuspid regurgitation.    Aorta: Normal sized aortic root. Mildly dilated ascending aorta. Ao ascending diameter is 3.5 cm.    Image quality is adequate.  ----  Heart monitor ziopatch 09/2022  SVT present with heart rate 126bpm to 140bpm  PACs occasional and PVC rare  167 SVT runs with the fastest interval lasting 2 minutes 56 seconds and max rate of 200bpm longest lasting 45 minutes 1 second with average rate of 133bpm, idioventricular rhythm.    ----  2d echo 09/2016  Stress 2D echocardiographic results  Baseline: Left ventricular size was normal. Overall left ventricular systolic   function was normal. Estimated left  ventricular ejection fraction was 60 % .     Peak stress: LV size was unchanged from the previous stage to mildly dilated   in some views. There was no change in LV  function since the previous stage with some hypokinesia in the inferior wall  ----  CTA of the chest 09/13/2022  No evidence of pulmonary embolism or other acute process.   ----  Duplex renal arteries 09/14/2016  INTERPRETATION/FINDINGS   Duplex images were obtained using 2-D gray scale, color flow, and   spectral Doppler analysis.   RENAL:   1. No significant renal artery stenosis identified, both renal   arteries visualized.   2. The right kidney measures 10.3 cm.   3. The left kidney measures 10.6 cm.   4. The renal vein is patent bilaterally.   5.  No evidence of aneurysm noted in the abdominal aorta.   ----    Have you had Fatigue?  No   2.   Have you had  Chest Pain? No   3.   Have you had Dyspnea (SOB) ? No   4.   Have you  had Orthopnea? No   5.   Have you had PND? No   6.   Have you had leg swelling? No   7.    Have you had any weight gain? No   8. Have you had any palpitations? No    9. Have you had any syncope? No   10. Do you have any wounds on legs? No     Reviewed with the patient and is as such.      Family History   Problem Relation Age of Onset    Cancer Mother 42        lymphoma    Heart Attack Father     Heart Attack Sister        Past Medical History:   Diagnosis Date    Hypertension     Thyroid disease     hypothyroidism    Unspecified sleep apnea     Doesn't use CPAP       Past Surgical History:   Procedure Laterality Date    KNEE ARTHROSCOPY Right     OTHER SURGICAL HISTORY      Fatty removed from left ankle    TONSILLECTOMY AND ADENOIDECTOMY      TUBAL LIGATION  Social History     Tobacco Use    Smoking status: Former    Smokeless tobacco: Never   Substance Use Topics    Alcohol use: No       Allergies   Allergen Reactions    Hydromorphone Nausea And Vomiting    Meperidine Nausea And Vomiting       Prior to Admission medications    Medication Sig Start Date End Date Taking? Authorizing Provider   atorvastatin (LIPITOR) 40 MG tablet Take 1 tablet by mouth daily    [provider]   chlorthalidone (HYGROTON) 25 MG tablet Take 1 tablet by mouth daily    [provider]   losartan (COZAAR) 100 MG tablet Take 1 tablet by mouth daily    [provider]   sertraline (ZOLOFT) 100 MG tablet Take 1 tablet by mouth daily    [provider]   metoprolol succinate (TOPROL XL) 25 MG extended release tablet Take 1 tablet by mouth daily    [provider]   BIOTIN PO Take by mouth daily  Patient not taking: Reported on 12/26/2022    Automatic Reconciliation, Ar   Levothyroxine Sodium 112 MCG CAPS Take 112 mcg by mouth Daily    Automatic Reconciliation, Ar       No results found for: "LIPIDPAN", "BMP", "CMP"     There were no vitals taken for this visit.    HPI    Review of Systems    Constitutional:  Negative for activity change, appetite change, diaphoresis, fatigue and unexpected weight change.   Eyes:  Negative for visual disturbance.   Respiratory:  Negative for apnea, cough, chest tightness, shortness of breath and wheezing.    Cardiovascular:  Negative for chest pain, palpitations and leg swelling.   Gastrointestinal:  Negative for abdominal pain, blood in stool, constipation, diarrhea and nausea.   Endocrine: Negative for cold intolerance and heat intolerance.   Genitourinary:  Negative for decreased urine volume and difficulty urinating.   Musculoskeletal:  Negative for gait problem, myalgias and neck pain.   Skin:  Negative for color change, pallor and rash.   Neurological:  Negative for dizziness, syncope, facial asymmetry, speech difficulty, weakness, light-headedness and numbness.   Psychiatric/Behavioral:  Negative for confusion and sleep disturbance.          Physical Exam  Vitals reviewed.   Constitutional:       General: She is not in acute distress.     Appearance: Normal appearance. She is not ill-appearing or diaphoretic.   HENT:      Head: Normocephalic and atraumatic.   Eyes:      General: No scleral icterus.        Right eye: No discharge.         Left eye: No discharge.      Conjunctiva/sclera: Conjunctivae normal.   Neck:      Vascular: No carotid bruit.   Cardiovascular:      Rate and Rhythm: Normal rate and regular rhythm.      Heart sounds: Normal heart sounds. No murmur heard.     No friction rub. No gallop.   Pulmonary:      Effort: Pulmonary effort is normal. No respiratory distress.      Breath sounds: Normal breath sounds. No wheezing, rhonchi or rales.   Chest:      Chest wall: No tenderness.   Abdominal:      General: Bowel sounds are normal. There is no distension.  Palpations: Abdomen is soft.      Tenderness: There is no abdominal tenderness. There is no guarding.   Musculoskeletal:         General: No swelling or tenderness.      Cervical back: Neck  supple.      Right lower leg: No edema.      Left lower leg: No edema.   Skin:     General: Skin is warm and dry.      Findings: No erythema or rash.   Neurological:      Mental Status: She is alert. Mental status is at baseline.      Motor: No weakness.      Gait: Gait normal.   Psychiatric:         Mood and Affect: Mood normal.         Behavior: Behavior normal.         Thought Content: Thought content normal.         ASSESSMENT and PLAN  Ms. Norr has a reminder for a "due or due soon" health maintenance. I have asked that she contact her primary care prov  ider for follow-up on this health maintenance.    SVT - Continue with metoprolol succinate 25mg  po daily, states that palpitations well controlled.  Without atrial fibrillation present, can discontinue eliquis.  Presented picture of monitor that shows atrial tachycardia at 127bpm that was regular from the emergency department.  2d echo 12/2022 with preserved ejection fraction 55 to 60 percent.      HTN - Stage II pressure, continue with metoprolol succinate 25mg  po daily, continue with losartan 100mg  po daily, continue chlorthalidone 41m mg po daily, start hydralazine 25mg  po TID evaluate if can tolerate.      HLD - Continue with lipitor 40mg  po daily, survey lipid panel periodically.      OSA on CPAP

## 2023-01-20 NOTE — Progress Notes (Signed)
Have you had Fatigue?  No     2.   Have you had Chest Pain? No     3.   Have you had Dyspnea (SOB) ? No     4.   Have you had Orthopnea? No     5.   Have you had PND? No     6.   Have you had leg swelling? No     7.    Have you had any weight gain? No     8. Have you had any palpitations? No     9. Have you had any syncope? No     10. Do you have any wounds on legs?No

## 2023-02-27 ENCOUNTER — Other Ambulatory Visit: Payer: Self-pay | Admitting: Family Medicine

## 2023-02-27 DIAGNOSIS — L820 Inflamed seborrheic keratosis: Secondary | ICD-10-CM | POA: Diagnosis not present

## 2023-03-08 ENCOUNTER — Ambulatory Visit (INDEPENDENT_AMBULATORY_CARE_PROVIDER_SITE_OTHER): Payer: Medicare Other | Admitting: Audiology

## 2023-03-08 DIAGNOSIS — Z719 Counseling, unspecified: Secondary | ICD-10-CM

## 2023-03-08 NOTE — Progress Notes (Signed)
Patient was seen today to pickup her replacement right hearing aid. Connected both hearing aids to the software and set her overall gain to around 80%. Patient was happy with the sound clarity, volume, and balance between her ears. Instructed her to call if any concerns arise.  Terri Douglas Terri Douglas, AUD  03/08/23

## 2023-03-28 DIAGNOSIS — D225 Melanocytic nevi of trunk: Secondary | ICD-10-CM | POA: Diagnosis not present

## 2023-03-28 DIAGNOSIS — L814 Other melanin hyperpigmentation: Secondary | ICD-10-CM | POA: Diagnosis not present

## 2023-03-28 DIAGNOSIS — L719 Rosacea, unspecified: Secondary | ICD-10-CM | POA: Diagnosis not present

## 2023-03-28 DIAGNOSIS — L821 Other seborrheic keratosis: Secondary | ICD-10-CM | POA: Diagnosis not present

## 2023-03-28 DIAGNOSIS — D2261 Melanocytic nevi of right upper limb, including shoulder: Secondary | ICD-10-CM | POA: Diagnosis not present

## 2023-03-28 DIAGNOSIS — D485 Neoplasm of uncertain behavior of skin: Secondary | ICD-10-CM | POA: Diagnosis not present

## 2023-03-28 DIAGNOSIS — R58 Hemorrhage, not elsewhere classified: Secondary | ICD-10-CM | POA: Diagnosis not present

## 2023-03-28 DIAGNOSIS — D224 Melanocytic nevi of scalp and neck: Secondary | ICD-10-CM | POA: Diagnosis not present

## 2023-03-28 DIAGNOSIS — D2271 Melanocytic nevi of right lower limb, including hip: Secondary | ICD-10-CM | POA: Diagnosis not present

## 2023-03-28 DIAGNOSIS — Z85828 Personal history of other malignant neoplasm of skin: Secondary | ICD-10-CM | POA: Diagnosis not present

## 2023-03-28 DIAGNOSIS — L570 Actinic keratosis: Secondary | ICD-10-CM | POA: Diagnosis not present

## 2023-03-28 DIAGNOSIS — L578 Other skin changes due to chronic exposure to nonionizing radiation: Secondary | ICD-10-CM | POA: Diagnosis not present

## 2023-04-10 ENCOUNTER — Ambulatory Visit: Payer: BLUE CROSS/BLUE SHIELD | Admitting: Family Medicine

## 2023-04-10 DIAGNOSIS — Z79890 Hormone replacement therapy (postmenopausal): Secondary | ICD-10-CM | POA: Diagnosis not present

## 2023-04-10 DIAGNOSIS — Z23 Encounter for immunization: Secondary | ICD-10-CM | POA: Diagnosis not present

## 2023-04-10 DIAGNOSIS — Z1231 Encounter for screening mammogram for malignant neoplasm of breast: Secondary | ICD-10-CM | POA: Diagnosis not present

## 2023-04-10 DIAGNOSIS — Z6825 Body mass index (BMI) 25.0-25.9, adult: Secondary | ICD-10-CM | POA: Diagnosis not present

## 2023-04-10 DIAGNOSIS — Z01419 Encounter for gynecological examination (general) (routine) without abnormal findings: Secondary | ICD-10-CM | POA: Diagnosis not present

## 2023-04-12 ENCOUNTER — Encounter: Payer: Self-pay | Admitting: Nurse Practitioner

## 2023-04-12 ENCOUNTER — Ambulatory Visit (INDEPENDENT_AMBULATORY_CARE_PROVIDER_SITE_OTHER): Payer: Medicare Other | Admitting: Nurse Practitioner

## 2023-04-12 VITALS — BP 134/76 | HR 76 | Temp 97.2°F | Ht 61.0 in | Wt 135.0 lb

## 2023-04-12 DIAGNOSIS — M5441 Lumbago with sciatica, right side: Secondary | ICD-10-CM | POA: Diagnosis not present

## 2023-04-12 DIAGNOSIS — G8929 Other chronic pain: Secondary | ICD-10-CM

## 2023-04-12 DIAGNOSIS — M1611 Unilateral primary osteoarthritis, right hip: Secondary | ICD-10-CM

## 2023-04-12 DIAGNOSIS — M85852 Other specified disorders of bone density and structure, left thigh: Secondary | ICD-10-CM | POA: Diagnosis not present

## 2023-04-12 MED ORDER — PREDNISONE 20 MG PO TABS: ORAL_TABLET | ORAL | 0 refills | Status: DC

## 2023-04-12 MED ORDER — TIZANIDINE HCL 4 MG PO TABS
4.0000 mg | ORAL_TABLET | Freq: Four times a day (QID) | ORAL | 0 refills | Status: DC | PRN
Start: 2023-04-12 — End: 2023-06-05

## 2023-04-12 MED ORDER — HYDROCODONE-ACETAMINOPHEN 5-325 MG PO TABS: 1.0000 | ORAL_TABLET | Freq: Four times a day (QID) | ORAL | 0 refills | Status: AC | PRN

## 2023-04-12 NOTE — Patient Instructions (Addendum)
Voltaren gel Lidocaine patches Tiger balm

## 2023-04-12 NOTE — Progress Notes (Unsigned)
Subjective:    Patient ID: Terri Douglas, female    DOB: 02-10-1946, 77 y.o.   MRN: [id number]  HPI right groin and right buttock pain Worse in 4 weeks- Referred to mri done years ago of right hip  Presents for complaints of worsening right low back pain and right hip pain for the past 4 weeks.  No history of injury.  Has chronic orthopedic issues including osteoarthritis and degenerative arthritis changes.  Has had left shoulder surgery.  Has had several surgeries on the cervical spine.  Pain radiates from the right low back area towards the right hip into the upper thigh area.  No abdominal pain.  No change in bowel or bladder habits.  Has tried a heating pad and Tylenol.  States the pain has become extremely intense where it is hard to walk.  Has seen a neurosurgeon and an orthopedic specialist in the past.   Review of Systems  Respiratory:  Negative for cough, chest tightness and shortness of breath.   Cardiovascular:  Negative for chest pain.  Gastrointestinal:  Negative for abdominal pain.  Genitourinary:  Negative for enuresis.  Musculoskeletal:  Positive for arthralgias, back pain and gait problem.       Objective:   Physical Exam NAD.  Alert, oriented.  Lungs clear.  Heart regular rate rhythm.  Tenderness noted with deep palpation in the right lumbar area into the mid right buttock.  Also distinct tenderness noted with palpation of the right hip area.  SLR positive at 90 degree angle.  Very limited ROM of the right hip, very tight with severely limited internal and external rotation.  Patellar and Achilles reflexes normal bilaterally.  Patient has great difficulty getting out of the chair and walking to the table.  Slow but steady gait. Today's Vitals   04/12/23 1336  BP: 134/76  Pulse: 76  Temp: (!) 97.2 F (36.2 C)  SpO2: 100%  Weight: 135 lb (61.2 kg)  Height: 5\' 1"  (1.549 m)   Body mass index is 25.51 kg/m. Had an MRI of the right hip without contrast on 05/28/2021.   IMPRESSION: 1. Mild to moderate right hip osteoarthritis. No acute osseous abnormality. 2. 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      Assessment & Plan:   Problem List Items Addressed This Visit       Nervous and Auditory   Chronic right-sided low back pain with right-sided sciatica   Relevant Medications   tiZANidine (ZANAFLEX) 4 MG tablet   predniSONE (DELTASONE) 20 MG tablet   HYDROcodone-acetaminophen (NORCO/VICODIN) 5-325 MG tablet     Musculoskeletal and Integument   Osteopenia   Relevant Orders   DG Bone Density   Primary osteoarthritis of right hip - Primary   Relevant Medications   tiZANidine (ZANAFLEX) 4 MG tablet   predniSONE (DELTASONE) 20 MG tablet   HYDROcodone-acetaminophen (NORCO/VICODIN) 5-325 MG tablet   Other Relevant Orders   Ambulatory referral to Orthopedic Surgery   Meds ordered this encounter  Medications   tiZANidine (ZANAFLEX) 4 MG tablet    Sig: Take 1 tablet (4 mg total) by mouth every 6 (six) hours as needed for muscle spasms.    Dispense:  30 tablet    Refill:  0    Order Specific Question:   Supervising Provider    Answer:   Lilyan Punt A [[id number]]   predniSONE (DELTASONE) 20 MG tablet    Sig: Take 2 po every day  x 5 d    Dispense:  10 tablet    Refill:  0    Order Specific Question:   Supervising Provider    Answer:   Lilyan Punt A [[id number]]   HYDROcodone-acetaminophen (NORCO/VICODIN) 5-325 MG tablet    Sig: Take 1 tablet by mouth every 6 (six) hours as needed for up to 5 days. Do not take with Alprazolam    Dispense:  20 tablet    Refill:  0    Order Specific Question:   Supervising Provider    Answer:   Lilyan Punt A [[id number]]   Continue ice/heat to the hip and back area.  Has taken prednisone in the past with good results.  Restart prednisone as directed.  Given one-time prescription for hydrocodone for severe pain.  Drowsiness precautions.  Also a trial of  tizanidine as directed again drowsiness precautions. Trial of various OTC products such as Voltaren gel lidocaine patches and Tiger balm. Schedule bone density study. Refer back to her previous orthopedic specialist for evaluation and treatment.  May need referral back to neurosurgeon depending on their assessment. Return if symptoms worsen or fail to improve, for Routine follow up with Dr. Lorin Picket.

## 2023-04-13 ENCOUNTER — Encounter: Payer: Self-pay | Admitting: Nurse Practitioner

## 2023-04-13 DIAGNOSIS — M25551 Pain in right hip: Secondary | ICD-10-CM | POA: Diagnosis not present

## 2023-04-18 ENCOUNTER — Ambulatory Visit (HOSPITAL_COMMUNITY)
Admission: RE | Admit: 2023-04-18 | Discharge: 2023-04-18 | Disposition: A | Discharge status: Home / Self Care | Payer: Medicare Other | Source: Ambulatory Visit | Attending: Nurse Practitioner | Admitting: Nurse Practitioner

## 2023-04-18 DIAGNOSIS — Z780 Asymptomatic menopausal state: Secondary | ICD-10-CM | POA: Diagnosis not present

## 2023-04-18 DIAGNOSIS — M85852 Other specified disorders of bone density and structure, left thigh: Secondary | ICD-10-CM | POA: Insufficient documentation

## 2023-05-10 DIAGNOSIS — M1611 Unilateral primary osteoarthritis, right hip: Secondary | ICD-10-CM | POA: Diagnosis not present

## 2023-05-10 DIAGNOSIS — M25551 Pain in right hip: Secondary | ICD-10-CM | POA: Diagnosis not present

## 2023-05-11 ENCOUNTER — Telehealth: Payer: Self-pay | Admitting: Cardiology

## 2023-05-11 NOTE — Telephone Encounter (Signed)
    Primary Cardiologist:None  Chart reviewed as part of pre-operative protocol coverage. Because of Artis Guilliams Mcnicholas's past medical history and time since last visit, he/she will require a follow-up visit in order to better assess preoperative cardiovascular risk.  Pre-op covering staff: - Please schedule Office appointment and call patient to inform them. - Please contact requesting surgeon's office via preferred method (i.e, phone, fax) to inform them of need for appointment prior to surgery.  If applicable, this message will also be routed to pharmacy pool and/or primary cardiologist for input on holding anticoagulant/antiplatelet agent as requested below so that this information is available at time of patient's appointment.   Ronney Asters, NP  05/11/2023, 12:46 PM

## 2023-05-11 NOTE — Telephone Encounter (Signed)
 Will send a message to the EP schedulers to reach out to the pt with an appt for pre op clearance.

## 2023-05-11 NOTE — Telephone Encounter (Signed)
Cassie, EP scheduler called the pt and she has been scheduled 05/15/23 with Canary Brim, NP.

## 2023-05-11 NOTE — Telephone Encounter (Signed)
   Pre-operative Risk Assessment    Patient Name: Terri Douglas  DOB: 1945/06/11 MRN: [id number]      Request for Surgical Clearance    Procedure:  Right Total Hip Replacement  Date of Surgery:  Clearance 07/06/23                                 Surgeon:  Eulas Post MD Surgeon's Group or Practice Name:  [facility] Phone number:  [phone] Fax number:  [phone]   Type of Clearance Requested:   - Medical    Type of Anesthesia:  Spinal   Additional requests/questions:    Signed, Narda Amber   05/11/2023, 12:17 PM

## 2023-05-14 NOTE — Progress Notes (Unsigned)
Electrophysiology Office Note:   Date:  05/15/2023  ID:  TANVEER BERGSTRAND, DOB 1945/06/23, MRN [id number]  Primary Cardiologist: None Electrophysiologist: Lanier Prude, MD      History of Present Illness:   Terri Douglas is a 77 y.o. female with h/o AFL s/p CTI ablation, SVT / AT, HTN, HLD, arthritis, gout, laryngopharyngeal reflux seen today for routine electrophysiology followup.   Since last being seen in our clinic the patient reports doing overall well. She is pending a hip surgery with Dr. Dion Saucier in January 2025. She walks in her home daily and averages 8,000 steps per day. Notes occasional LE swelling and is on hydrochlorothiazide. Denies palpitations / fast rates. Thought she had run out of her Toprol and has not been taking it since Oct.  However, she actually has refills at her pharmacy.   She denies chest pain, palpitations, dyspnea, PND, orthopnea, nausea, vomiting, dizziness, syncope, weight gain, or early satiety.   Review of systems complete and found to be negative unless listed in HPI.   EP Information / Studies Reviewed:    EKG is ordered today. Personal review as below.  EKG Interpretation Date/Time:  Monday May 15 2023 14:32:37 EST Ventricular Rate:  62 PR Interval:    QRS Duration:  72 QT Interval:  418 QTC Calculation: 424 R Axis:   34  Text Interpretation: Sinus rhythm Confirmed by Canary Brim ([id number]) on 05/15/2023 2:37:28 PM   Studies:  ECHO 06/2017 > LV55-60%, no RWMA Zio 06/2021 > HR 51-197, ave 66bpm, 92 SVT episodes - longest lasting 45 sec with ave 110 bpm. Occ supraventricular ectopy 2%, rare ventricular ectopy    Arrhythmia / AAD AFL s/p CTI ablation 07/2017  SVT / AT > seen on monitor 06/2021        Physical Exam:   VS:  BP 120/62 (BP Location: Left Arm, Patient Position: Sitting, Cuff Size: Normal)   Pulse 62   Ht 5\' 2"  (1.575 m)   Wt 135 lb (61.2 kg)   SpO2 96%   BMI 24.69 kg/m    Wt Readings from Last 3 Encounters:  05/15/23  135 lb (61.2 kg)  04/12/23 135 lb (61.2 kg)  01/16/23 133 lb (60.3 kg)     GEN: Well nourished, well developed in no acute distress NECK: No JVD; No carotid bruits CARDIAC: Regular rate and rhythm, no murmurs, rubs, gallops RESPIRATORY:  Clear to auscultation without rales, wheezing or rhonchi  ABDOMEN: Soft, non-tender, non-distended EXTREMITIES:  No edema; No deformity   ASSESSMENT AND PLAN:    AFL s/p CTI Ablation  -no recurrent symptoms   -EKG with SR  Atrial Tachycardia  -continue metoprolol 50mg  daily > reviewed with patient   HTN -per primary, well controlled on current regimen  Pre-Operative Surgical Clearance Procedure: Right Total Hip Replacement  Date of Surgery: 07/06/23  Surgeon:  Eulas Post MD Surgeon's Group or Practice Name:  [facility] Phone number:  [phone] Fax number:  [phone] Type of Anesthesia:  Spinal     Ms. Wheatley's perioperative risk of a major cardiac event is 0.4% according to the Revised Cardiac Risk Index (RCRI). Therefore, she is at low risk for perioperative complications.  Her functional capacity is good at least 5.72 METs (though likely better, limited with hip pain) according to the Duke Activity Status Index (DASI).    Note she has a hx of multiple cervical spine surgeries.   Recommendations: -Pt not on anticoagulation    Follow up with  EP APP  recall in 9 months   Signed, Canary Brim, MSN, APRN, NP-C, AGACNP-BC [facility] - Electrophysiology  05/15/2023, 3:32 PM

## 2023-05-15 ENCOUNTER — Encounter: Payer: Self-pay | Admitting: Pulmonary Disease

## 2023-05-15 ENCOUNTER — Ambulatory Visit: Payer: Medicare Other | Attending: Pulmonary Disease | Admitting: Pulmonary Disease

## 2023-05-15 VITALS — BP 120/62 | HR 62 | Ht 62.0 in | Wt 135.0 lb

## 2023-05-15 DIAGNOSIS — Z8679 Personal history of other diseases of the circulatory system: Secondary | ICD-10-CM | POA: Diagnosis not present

## 2023-05-15 DIAGNOSIS — I10 Essential (primary) hypertension: Secondary | ICD-10-CM | POA: Diagnosis not present

## 2023-05-15 DIAGNOSIS — I471 Supraventricular tachycardia, unspecified: Secondary | ICD-10-CM | POA: Diagnosis not present

## 2023-05-15 NOTE — Patient Instructions (Signed)
Medication Instructions:  Your physician recommends that you continue on your current medications as directed. Please refer to the Current Medication list given to you today.  *If you need a refill on your cardiac medications before your next appointment, please call your pharmacy*  Lab Work: None ordered If you have labs (blood work) drawn today and your tests are completely normal, you will receive your results only by: MyChart Message (if you have MyChart) OR A paper copy in the mail If you have any lab test that is abnormal or we need to change your treatment, we will call you to review the results.  Follow-Up: At [facility], you and your health needs are our priority.  As part of our continuing mission to provide you with exceptional heart care, we have created designated Provider Care Teams.  These Care Teams include your primary Cardiologist (physician) and Advanced Practice Providers (APPs -  Physician Assistants and Nurse Practitioners) who all work together to provide you with the care you need, when you need it.   Your next appointment:   9 month(s)  Provider:   Canary Brim, NP

## 2023-06-01 ENCOUNTER — Ambulatory Visit: Payer: BLUE CROSS/BLUE SHIELD | Admitting: Family Medicine

## 2023-06-05 ENCOUNTER — Ambulatory Visit (INDEPENDENT_AMBULATORY_CARE_PROVIDER_SITE_OTHER): Payer: Medicare Other | Admitting: Family Medicine

## 2023-06-05 VITALS — BP 128/72 | HR 59 | Temp 97.2°F | Ht 62.0 in | Wt 135.4 lb

## 2023-06-05 DIAGNOSIS — G4700 Insomnia, unspecified: Secondary | ICD-10-CM

## 2023-06-05 DIAGNOSIS — K219 Gastro-esophageal reflux disease without esophagitis: Secondary | ICD-10-CM | POA: Diagnosis not present

## 2023-06-05 DIAGNOSIS — I10 Essential (primary) hypertension: Secondary | ICD-10-CM | POA: Diagnosis not present

## 2023-06-05 DIAGNOSIS — E7849 Other hyperlipidemia: Secondary | ICD-10-CM | POA: Diagnosis not present

## 2023-06-05 DIAGNOSIS — M1611 Unilateral primary osteoarthritis, right hip: Secondary | ICD-10-CM | POA: Diagnosis not present

## 2023-06-05 MED ORDER — ALPRAZOLAM 1 MG PO TABS: 1.0000 mg | ORAL_TABLET | Freq: Every evening | ORAL | 1 refills | Status: DC | PRN

## 2023-06-05 MED ORDER — HYDROCHLOROTHIAZIDE 12.5 MG PO TABS: 12.5000 mg | ORAL_TABLET | Freq: Every day | ORAL | 1 refills | Status: DC

## 2023-06-05 MED ORDER — METOPROLOL SUCCINATE ER 50 MG PO TB24: 50.0000 mg | ORAL_TABLET | Freq: Every day | ORAL | 1 refills | Status: DC

## 2023-06-05 MED ORDER — POTASSIUM CHLORIDE ER 10 MEQ PO TBCR: EXTENDED_RELEASE_TABLET | ORAL | 1 refills | Status: DC

## 2023-06-05 MED ORDER — ROSUVASTATIN CALCIUM 20 MG PO TABS: 20.0000 mg | ORAL_TABLET | Freq: Every day | ORAL | 1 refills | Status: DC

## 2023-06-05 MED ORDER — ESOMEPRAZOLE MAGNESIUM 40 MG PO CPDR: DELAYED_RELEASE_CAPSULE | ORAL | 3 refills | Status: DC

## 2023-06-05 MED ORDER — SERTRALINE HCL 100 MG PO TABS: ORAL_TABLET | ORAL | 1 refills | Status: DC

## 2023-06-05 NOTE — Progress Notes (Signed)
Subjective:    Patient ID: Terri Douglas, female    DOB: August 06, 1945, 77 y.o.   MRN: [id number]  Discussed the use of AI scribe software for clinical note transcription with the patient, who gave verbal consent to proceed.  History of Present Illness   The patient, with a history of hip pain, gout, and hypertension, she reports an episode of dyspnea while sitting, but denies any breathing difficulties while walking. She also reports fatigue, often feeling the need to nap after doing housework. No doe or chest pain with walking  The patient's hip pain is a daily occurrence, worsening when getting up and down. She also reports balance issues, which she attributes to her hip pain rather than dizziness. She is scheduled for hip surgery, which is causing her some anxiety.  The patient's mood has been low, which she attributes to struggles with her husband's worsening memory. She is taking sertraline for mood regulation, allopurinol for gout, alprazolam at night, hydrochlorothiazide and metoprolol for hypertension, and potassium supplements.  The patient's appetite and bowel movements are normal. She has been active, walking around the house and achieving a milestone of 250 miles on her Fitbit. She is concerned about her ability to manage post-surgery, particularly with her husband's inability to cook and her own restrictions on activity. She is planning to stock up on easy-to-prepare foods and hopes for assistance from others.         Review of Systems     Objective:    Physical Exam   VITALS: BP- 128/72 EXTREMITIES: No edema at ankles     General-in no acute distress Eyes-no discharge Lungs-respiratory rate normal, CTA CV-no murmurs,RRR Extremities skin warm dry no edema Neuro grossly normal Behavior normal, alert       Assessment & Plan:  Assessment and Plan    Hip Pain Daily pain, worse with getting up and down. Balance affected due to hip pain. Upcoming hip surgery  scheduled. -Continue current pain management regimen. -Encourage patient to follow post-surgery mobilization instructions to prevent blood clots. -Plan for home physical therapy post-surgery.    Depression Patient reports low mood, primarily related to husband's declining memory. -Continue Sertraline.  Hypertension Patient is on Hydrochlorothiazide and Metoprolol for blood pressure control. -Continue Hydrochlorothiazide and Metoprolol.  Gout Patient is on Allopurinol for gout. -Continue Allopurinol.  Insomnia Patient is on Alprazolam for sleep. -Continue Alprazolam.  Hyperlipidemia Patient is on cholesterol medication. -Continue cholesterol medication.  General Health Maintenance -Check blood work results from recent pre-surgery tests. -Plan for follow-up visit in late spring. -Provide temporary handicap parking permit for post-surgery recovery period.      From a surgical perspective patient should do well on her surgery.  She is able to walk without shortness of breath.  She is able to do housework and some mild yard work without difficulty does not have a history of heart disease she achieves 4 METS without difficulty    1. Primary osteoarthritis of right hip (Primary) Will have surgery with orthopedist Presurgical assessment completed today Her surgery group will be doing the lab work before her surgery  2. Primary hypertension Blood pressure good control continue current measures  3. Laryngopharyngeal reflux (LPR) Continue PPI - esomeprazole (NEXIUM) 40 MG capsule; TAKE ONE CAPSULE (40MG  TOTAL) BY MOUTH DAILY AT 12 NOON  Dispense: 90 capsule; Refill: 3  4. Other hyperlipidemia Continue statin check labs later in the year  5. Insomnia, unspecified type Xanax patient has been on long-term may continue this  for now

## 2023-06-05 NOTE — Addendum Note (Signed)
Addended by: Lilyan Punt A on: 06/05/2023 12:13 PM   Modules accepted: Orders

## 2023-07-06 DIAGNOSIS — M71351 Other bursal cyst, right hip: Secondary | ICD-10-CM | POA: Diagnosis not present

## 2023-07-06 DIAGNOSIS — M25751 Osteophyte, right hip: Secondary | ICD-10-CM | POA: Diagnosis not present

## 2023-07-06 DIAGNOSIS — M1611 Unilateral primary osteoarthritis, right hip: Secondary | ICD-10-CM | POA: Diagnosis not present

## 2023-07-06 HISTORY — PX: TOTAL HIP ARTHROPLASTY: SHX124

## 2023-07-10 ENCOUNTER — Ambulatory Visit (HOSPITAL_COMMUNITY): Payer: Medicare Other | Attending: Family Medicine

## 2023-07-10 ENCOUNTER — Other Ambulatory Visit: Payer: Self-pay

## 2023-07-10 ENCOUNTER — Encounter (HOSPITAL_COMMUNITY): Payer: Self-pay

## 2023-07-10 DIAGNOSIS — Z96641 Presence of right artificial hip joint: Secondary | ICD-10-CM | POA: Diagnosis not present

## 2023-07-10 DIAGNOSIS — Z7409 Other reduced mobility: Secondary | ICD-10-CM | POA: Insufficient documentation

## 2023-07-10 DIAGNOSIS — M6281 Muscle weakness (generalized): Secondary | ICD-10-CM | POA: Diagnosis not present

## 2023-07-10 NOTE — Therapy (Signed)
OUTPATIENT PHYSICAL THERAPY LOWER EXTREMITY EVALUATION   Patient Name: Terri Douglas MRN: [id number] DOB:[birth year], 78 y.o., female Today's Date: 07/10/2023  END OF SESSION:  PT End of Session - 07/10/23 1352     Visit Number 1    Date for PT Re-Evaluation 09/04/23    Authorization Type Medicare A & B; BCBS supplement    Authorization Time Period no auth no limit    Progress Note Due on Visit 10    PT Start Time 0915    PT Stop Time 1015    PT Time Calculation (min) 60 min    Activity Tolerance Patient tolerated treatment well    Behavior During Therapy WFL for tasks assessed/performed             Past Medical History:  Diagnosis Date   Arthritis    Complication of anesthesia    Gall stones    GERD (gastroesophageal reflux disease)    History of cardiac catheterization 2005   Minor elevation in cardiac enzymes however no significant obstructive CAD   Hyperlipidemia    Hypertension    Insomnia    PONV (postoperative nausea and vomiting)    Prediabetes    Past Surgical History:  Procedure Laterality Date   A-FLUTTER ABLATION N/A 08/01/2017   Procedure: A-FLUTTER ABLATION;  Surgeon: Hillis Range, MD;  Location: [facility];  Service: Cardiovascular;  Laterality: N/A;   ABDOMINAL HYSTERECTOMY     BACK SURGERY  1992   CARDIAC CATHETERIZATION     BACK IN 2004  SHE THINKS IT CAME BACK 'NORMAL'   CHOLECYSTECTOMY N/A 06/24/2015   Procedure: LAPAROSCOPIC CHOLECYSTECTOMY;  Surgeon: Rodman Pickle, MD;  Location: [facility] OR;  Service: General;  Laterality: N/A;   COLONOSCOPY N/A 08/30/2012   Procedure: COLONOSCOPY;  Surgeon: Malissa Hippo, MD;  Location: [facility];  Service: Endoscopy;  Laterality: N/A;  830   DILATION AND CURETTAGE OF UTERUS     ESOPHAGOGASTRODUODENOSCOPY  04/15/2011   Procedure: ESOPHAGOGASTRODUODENOSCOPY (EGD);  Surgeon: Malissa Hippo, MD;  Location: [facility];  Service: Endoscopy;  Laterality: N/A;  11:30   ESOPHAGOGASTRODUODENOSCOPY  N/A 05/27/2016   Procedure: ESOPHAGOGASTRODUODENOSCOPY (EGD);  Surgeon: Malissa Hippo, MD;  Location: [facility];  Service: Endoscopy;  Laterality: N/A;  11:15   EYE SURGERY     cataract removal   NECK SURGERY  12/13/2018   REVERSE SHOULDER ARTHROPLASTY Left 04/03/2019   Procedure: REVERSE SHOULDER ARTHROPLASTY;  Surgeon: Bjorn Pippin, MD;  Location: [facility];  Service: Orthopedics;  Laterality: Left;   VAGINAL HYSTERECTOMY     Patient Active Problem List   Diagnosis Date Noted   Chronic right-sided low back pain with right-sided sciatica 04/12/2023   History of atrial flutter 04/20/2022   Paroxysmal SVT (supraventricular tachycardia) (HCC) 04/20/2022   Open wound of left lower leg 03/28/2022   Reactive arthritis (HCC) 12/28/2020   Arthritis 11/20/2020   Laryngopharyngeal reflux (LPR) 09/28/2020   Primary osteoarthritis of right hip 01/17/2020   Pedal edema 11/19/2019   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   Insomnia 03/15/2019   Hyperlipidemia 06/18/2014   Gout 06/18/2014   Osteopenia 10/28/2013   Hypertension 03/29/2011    PCP: Babs Sciara, MD  REFERRING PROVIDER:   Teryl Lucy, MD    REFERRING DIAG: R POST THR on 07/06/2023  THERAPY DIAG:  S/P total right hip arthroplasty  Impaired functional mobility, balance, gait, and endurance  Muscle weakness (generalized)  Rationale for Evaluation and  Treatment: Rehabilitation  ONSET DATE: 07/06/2023  SUBJECTIVE:   SUBJECTIVE STATEMENT: Lateral approach total hip arthroplasty on 07/06/2023. Pt voices concerns with husband ability to assist with caring for THA due to potential dementia. Wife voices concerns with safety concerns for hip. Pt reports she feels safe on a physical level and does not feel she will be abused but is concerned for how he treats/assists with level of care.   PERTINENT HISTORY: Reverse TSA Osteopenia HTN PAIN:  Are you having pain? Yes: NPRS scale: 4/10 Pain  location: Right hip Pain description: dull/achy Aggravating factors: fast movements Relieving factors: OTC medications, Iceman  PRECAUTIONS: Other: No ER/IR and straight leg.   RED FLAGS: None   WEIGHT BEARING RESTRICTIONS: No  FALLS:  Has patient fallen in last 6 months? No   PATIENT GOALS: "do more on my own and get comfortable doing"   OBJECTIVE:  Note: Objective measures were completed at Evaluation unless otherwise noted.  PATIENT SURVEYS:  LEFS 22/80 = 27.5% ABC Scale: 1070/1600= 66.9%  COGNITION: Overall cognitive status: Within functional limits for tasks assessed     SENSATION: WFL  POSTURE: increased thoracic kyphosis  LOWER EXTREMITY ROM:  Active ROM Right eval Left eval  Hip flexion    Hip extension    Hip abduction    Hip adduction    Hip internal rotation    Hip external rotation    Knee flexion    Knee extension    Ankle dorsiflexion    Ankle plantarflexion    Ankle inversion    Ankle eversion     (Blank rows = not tested)  LOWER EXTREMITY MMT:  MMT Right eval Left eval  Hip flexion    Hip extension    Hip abduction    Hip adduction    Hip internal rotation    Hip external rotation    Knee flexion    Knee extension    Ankle dorsiflexion    Ankle plantarflexion    Ankle inversion    Ankle eversion     (Blank rows = not tested)   FUNCTIONAL TESTS:  30 seconds chair stand test:6x w/ 2 HHA on RW  2 minute walk test: 132ft  GAIT: Distance walked: 115ft Assistive device utilized: Environmental consultant - 2 wheeled Level of assistance: Modified independence Comments: mild right R circumduction during swing phase, limited knee flexion in R lower extremity                                                                                                                                TREATMENT DATE:    07/10/2023 PT Evaluation   -Ted hose donned- educated mechanics and techniques with family members to assist -Educated on ADLs for  donning/doffing pants independently with grabber in supine position.  -HEP below Supine bridge with 1 x 12 w/ 3 isometric hold   PATIENT EDUCATION:  Education details: PT Evaluation, findings, prognosis, frequency, attendance policy, and HEP  if given.  Person educated: Patient Education method: Medical illustrator Education comprehension: verbalized understanding  HOME EXERCISE PROGRAM: Access Code: [email or web address] URL: [email or web address] Date: 07/10/2023 Prepared by: Starling Manns  Exercises - Seated Ankle Pumps  - 1 x daily - 7 x weekly - 2-3 sets - 20 reps - Seated Long Arc Quad  - 1 x daily - 7 x weekly - 2-3 sets - 20 reps - Supine Bridge  - 1 x daily - 7 x weekly - 2-3 sets - 10-12 reps - Supine Heel Slide  - 1 x daily - 7 x weekly - 2-3 sets - 20 reps - Supine Quad Set  - 1 x daily - 7 x weekly - 2-3 sets - 20 reps  ASSESSMENT:  CLINICAL IMPRESSION: Patient is a 78 y.o. female who was seen today for physical therapy evaluation and treatment for s/p R THR on 07/06/2023. At EOS  pt reporting decreased pain 3/10 pain compared to start of session. Pt with mild edema bilaterally due to inability to place tedhose on at current moment due to hip pain. Pt demonstrating significant limitations in ADLs, transfers, functional mobility, ambulation and otther limitations noted below due to muscle weakness, balance deficits, decreased ROM and pain in setting of R THA. Pt will benefit from skilled Physical Therapy services to address deficits/limitations in order to improve functional and QOL. .   OBJECTIVE IMPAIRMENTS: Abnormal gait, decreased activity tolerance, decreased balance, decreased endurance, decreased mobility, difficulty walking, decreased ROM, decreased strength, postural dysfunction, and pain.   ACTIVITY LIMITATIONS: carrying, lifting, bending, sitting, standing, squatting, sleeping, stairs, transfers, bed mobility, toileting, dressing, and locomotion  level  PARTICIPATION LIMITATIONS: shopping, community activity, occupation, and yard work  PERSONAL FACTORS: Age, Past/current experiences, and 3+ comorbidities: see above for details  are also affecting patient's functional outcome.   REHAB POTENTIAL: Good  CLINICAL DECISION MAKING: Stable/uncomplicated  EVALUATION COMPLEXITY: Low   GOALS: Goals reviewed with patient? No  SHORT TERM GOALS: Target date: 08/07/2023  Pt will be independent with HEP in order to demonstrate participation in Physical Therapy POC.  Baseline: Goal status: INITIAL  2.  Pt will 2/10 pain during mobility in order to demonstrate improved pain with functional activities.  Baseline:  Goal status: INITIAL  LONG TERM GOALS: Target date: 09/04/2023  Pt will improve 30 Second Chair Stand Test by >4 reps  in order to demonstrate improved functional strength to return to desired activities.  Baseline: See objective.  Goal status: INITIAL  2.  Pt will improve 2 MWT by 278ft in order to demonstrate improved functional ambulatory capacity in community setting.  Baseline: See objective.  Goal status: INITIAL  3.  Pt will improve LEFS score by 20 points in order to demonstrate improved outcomes with functional tasks and ADLs. Baseline: See objective.  Goal status: INITIAL  4.  Pt will improve ABC scale by 20 points in order to demonstrated improved safety and balance during functional activities.. Baseline: See objective.  Goal status: INITIAL    PLAN:  PT FREQUENCY: 2x/week  PT DURATION: 6 weeks  PLANNED INTERVENTIONS: 97164- PT Re-evaluation, 97110-Therapeutic exercises, 97530- Therapeutic activity, 97112- Neuromuscular re-education, 97535- Self Care, 59563- Manual therapy, [id number]- Gait training, 97014- Electrical stimulation (unattended), [id number]- Electrical stimulation (manual), [id number]- Traction (mechanical), Balance training, Stair training, Cryotherapy, and Moist heat  PLAN FOR NEXT SESSION: ROM, early  BLE strengthening, no active ER/IR of R hip currently, general strengthening  Elie Goody, DPT [facility] [place] [phone]  office   Nelida Meuse, PT 07/10/2023, 1:54 PM

## 2023-07-13 ENCOUNTER — Ambulatory Visit (HOSPITAL_COMMUNITY): Payer: Medicare Other

## 2023-07-18 ENCOUNTER — Encounter (HOSPITAL_COMMUNITY): Payer: Self-pay

## 2023-07-18 ENCOUNTER — Ambulatory Visit (HOSPITAL_COMMUNITY): Payer: Medicare Other

## 2023-07-18 DIAGNOSIS — Z7409 Other reduced mobility: Secondary | ICD-10-CM | POA: Diagnosis not present

## 2023-07-18 DIAGNOSIS — Z96641 Presence of right artificial hip joint: Secondary | ICD-10-CM

## 2023-07-18 DIAGNOSIS — M6281 Muscle weakness (generalized): Secondary | ICD-10-CM | POA: Diagnosis not present

## 2023-07-18 NOTE — Therapy (Signed)
OUTPATIENT PHYSICAL THERAPY LOWER EXTREMITY TREATMENT   Patient Name: Terri Douglas MRN: [id number] DOB:[birth year], 78 y.o., female Today's Date: 07/18/2023  END OF SESSION:  PT End of Session - 07/18/23 1021     Visit Number 2    Number of Visits 12    Date for PT Re-Evaluation 09/04/23    Authorization Type Medicare A & B; BCBS supplement    Progress Note Due on Visit 10    PT Start Time [phone]    PT Stop Time 1017    PT Time Calculation (min) 43 min    Activity Tolerance Patient tolerated treatment well    Behavior During Therapy WFL for tasks assessed/performed              Past Medical History:  Diagnosis Date   Arthritis    Complication of anesthesia    Gall stones    GERD (gastroesophageal reflux disease)    History of cardiac catheterization 2005   Minor elevation in cardiac enzymes however no significant obstructive CAD   Hyperlipidemia    Hypertension    Insomnia    PONV (postoperative nausea and vomiting)    Prediabetes    Past Surgical History:  Procedure Laterality Date   A-FLUTTER ABLATION N/A 08/01/2017   Procedure: A-FLUTTER ABLATION;  Surgeon: Hillis Range, MD;  Location: [facility];  Service: Cardiovascular;  Laterality: N/A;   ABDOMINAL HYSTERECTOMY     BACK SURGERY  1992   CARDIAC CATHETERIZATION     BACK IN 2004  SHE THINKS IT CAME BACK 'NORMAL'   CHOLECYSTECTOMY N/A 06/24/2015   Procedure: LAPAROSCOPIC CHOLECYSTECTOMY;  Surgeon: Rodman Pickle, MD;  Location: [facility] OR;  Service: General;  Laterality: N/A;   COLONOSCOPY N/A 08/30/2012   Procedure: COLONOSCOPY;  Surgeon: Malissa Hippo, MD;  [facility]: [facility];  Service: Endoscopy;  Laterality: N/A;  830   DILATION AND CURETTAGE OF UTERUS     ESOPHAGOGASTRODUODENOSCOPY  04/15/2011   Procedure: ESOPHAGOGASTRODUODENOSCOPY (EGD);  Surgeon: Malissa Hippo, MD;  [facility]: [facility];  Service: Endoscopy;  Laterality: N/A;  11:30   ESOPHAGOGASTRODUODENOSCOPY N/A 05/27/2016    Procedure: ESOPHAGOGASTRODUODENOSCOPY (EGD);  Surgeon: Malissa Hippo, MD;  Location: [facility];  Service: Endoscopy;  Laterality: N/A;  11:15   EYE SURGERY     cataract removal   NECK SURGERY  12/13/2018   REVERSE SHOULDER ARTHROPLASTY Left 04/03/2019   Procedure: REVERSE SHOULDER ARTHROPLASTY;  Surgeon: Bjorn Pippin, MD;  Location: [facility];  Service: Orthopedics;  Laterality: Left;   VAGINAL HYSTERECTOMY     Patient Active Problem List   Diagnosis Date Noted   Chronic right-sided low back pain with right-sided sciatica 04/12/2023   History of atrial flutter 04/20/2022   Paroxysmal SVT (supraventricular tachycardia) (HCC) 04/20/2022   Open wound of left lower leg 03/28/2022   Reactive arthritis (HCC) 12/28/2020   Arthritis 11/20/2020   Laryngopharyngeal reflux (LPR) 09/28/2020   Primary osteoarthritis of right hip 01/17/2020   Pedal edema 11/19/2019   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   Insomnia 03/15/2019   Hyperlipidemia 06/18/2014   Gout 06/18/2014   Osteopenia 10/28/2013   Hypertension 03/29/2011    PCP: Babs Sciara, MD  REFERRING PROVIDER:   Teryl Lucy, MD    REFERRING DIAG: R POST THR on 07/06/2023  THERAPY DIAG:  S/P total right hip arthroplasty  Impaired functional mobility, balance, gait, and endurance  Muscle weakness (generalized)  Rationale for Evaluation and Treatment: Rehabilitation  ONSET DATE: 07/06/2023  SUBJECTIVE:   SUBJECTIVE STATEMENT: 07/18/23:  Reports pain scale 4-5/10 Rt groin pain constant dull achy pain.  Has began HEP without questions.  Returns to [place] tomorrow, hopes the weather is permitting.    Eval:  Lateral approach total hip arthroplasty on 07/06/2023. Pt voices concerns with husband ability to assist with caring for THA due to potential dementia. Wife voices concerns with safety concerns for hip. Pt reports she feels safe on a physical level and does not feel she will be abused but is concerned for  how he treats/assists with level of care.   PERTINENT HISTORY: Reverse TSA Osteopenia HTN PAIN:  Are you having pain? Yes: NPRS scale: 4/10 Pain location: Right hip Pain description: dull/achy Aggravating factors: fast movements Relieving factors: OTC medications, Iceman  PRECAUTIONS: Other: No ER/IR and straight leg.   RED FLAGS: None   WEIGHT BEARING RESTRICTIONS: No  FALLS:  Has patient fallen in last 6 months? No   PATIENT GOALS: "do more on my own and get comfortable doing"   OBJECTIVE:  Note: Objective measures were completed at Evaluation unless otherwise noted.  PATIENT SURVEYS:  LEFS 22/80 = 27.5% ABC Scale: 1070/1600= 66.9%  COGNITION: Overall cognitive status: Within functional limits for tasks assessed     SENSATION: WFL  POSTURE: increased thoracic kyphosis  LOWER EXTREMITY ROM:  Active ROM Right eval Left eval  Hip flexion    Hip extension    Hip abduction    Hip adduction    Hip internal rotation    Hip external rotation    Knee flexion    Knee extension    Ankle dorsiflexion    Ankle plantarflexion    Ankle inversion    Ankle eversion     (Blank rows = not tested)  LOWER EXTREMITY MMT:  MMT Right eval Left eval  Hip flexion    Hip extension    Hip abduction    Hip adduction    Hip internal rotation    Hip external rotation    Knee flexion    Knee extension    Ankle dorsiflexion    Ankle plantarflexion    Ankle inversion    Ankle eversion     (Blank rows = not tested)   FUNCTIONAL TESTS:  30 seconds chair stand test:6x w/ 2 HHA on RW  2 minute walk test: 18ft  GAIT: Distance walked: 157ft Assistive device utilized: Environmental consultant - 2 wheeled Level of assistance: Modified independence Comments: mild right R circumduction during swing phase, limited knee flexion in R lower extremity                                                                                                                                TREATMENT  DATE:  07/18/23:   Reviewed goals Educated importance of HEP compliance for maximal benefits  Supine: Heel slide 10x Quad sets 10x 3" Bridge 10x 3" Isometric ball squeeze 10x  3" Isometric abduction against belt  Sitting: LAQ STS 10 required HHA Shown chair push ups  Reviewed sequence ambulating with QCx 40 ft 3 point sequence  07/10/2023 PT Evaluation   -Ted hose donned- educated mechanics and techniques with family members to assist -Educated on ADLs for donning/doffing pants independently with grabber in supine position.  -HEP below Supine bridge with 1 x 12 w/ 3 isometric hold   PATIENT EDUCATION:  Education details: PT Evaluation, findings, prognosis, frequency, attendance policy, and HEP if given.  Person educated: Patient Education method: Medical illustrator Education comprehension: verbalized understanding  HOME EXERCISE PROGRAM: Access Code: [email or web address] URL: [email or web address] Date: 07/10/2023 Prepared by: Starling Manns  Exercises - Seated Ankle Pumps  - 1 x daily - 7 x weekly - 2-3 sets - 20 reps - Seated Long Arc Quad  - 1 x daily - 7 x weekly - 2-3 sets - 20 reps - Supine Bridge  - 1 x daily - 7 x weekly - 2-3 sets - 10-12 reps - Supine Heel Slide  - 1 x daily - 7 x weekly - 2-3 sets - 20 reps - Supine Quad Set  - 1 x daily - 7 x weekly - 2-3 sets - 20 reps  Access Code: [email or web address] URL: [email or web address] Date: 07/18/2023 Prepared by: Becky Sax  Exercises - Supine Hip Adduction Isometric with Ball  - 2 x daily - 7 x weekly - 2 sets - 10 reps - 5" hold - Hooklying Isometric Hip Abduction with Belt  - 2 x daily - 7 x weekly - 2 sets - 10 reps - 5" hold  ASSESSMENT:  CLINICAL IMPRESSION: 07/18/23:  Reviewed goals and educated importance of HEP compliance.  Pt able to recall and demonstrate appropriate mechanics with current exercise program.  Session focus on gluteal strengthening.  Added isometric  strengthening with reports of some pain relief, added to HEP.  Pt stated she has been walking some with cane at home, reviewed sequence and proper arm with good mechanics safely.  Encouraged pt to continues with RW but can practice with cane at home, verbalized understanding.    Eval:  Patient is a 78 y.o. female who was seen today for physical therapy evaluation and treatment for s/p R THR on 07/06/2023. At EOS  pt reporting decreased pain 3/10 pain compared to start of session. Pt with mild edema bilaterally due to inability to place tedhose on at current moment due to hip pain. Pt demonstrating significant limitations in ADLs, transfers, functional mobility, ambulation and otther limitations noted below due to muscle weakness, balance deficits, decreased ROM and pain in setting of R THA. Pt will benefit from skilled Physical Therapy services to address deficits/limitations in order to improve functional and QOL. .   OBJECTIVE IMPAIRMENTS: Abnormal gait, decreased activity tolerance, decreased balance, decreased endurance, decreased mobility, difficulty walking, decreased ROM, decreased strength, postural dysfunction, and pain.   ACTIVITY LIMITATIONS: carrying, lifting, bending, sitting, standing, squatting, sleeping, stairs, transfers, bed mobility, toileting, dressing, and locomotion level  PARTICIPATION LIMITATIONS: shopping, community activity, occupation, and yard work  PERSONAL FACTORS: Age, Past/current experiences, and 3+ comorbidities: see above for details  are also affecting patient's functional outcome.   REHAB POTENTIAL: Good  CLINICAL DECISION MAKING: Stable/uncomplicated  EVALUATION COMPLEXITY: Low   GOALS: Goals reviewed with patient? No  SHORT TERM GOALS: Target date: 08/07/2023  Pt will be independent with HEP in order to demonstrate participation in Physical Therapy POC.  Baseline: Goal status: INITIAL  2.  Pt  will 2/10 pain during mobility in order to demonstrate  improved pain with functional activities.  Baseline:  Goal status: INITIAL  LONG TERM GOALS: Target date: 09/04/2023  Pt will improve 30 Second Chair Stand Test by >4 reps  in order to demonstrate improved functional strength to return to desired activities.  Baseline: See objective.  Goal status: INITIAL  2.  Pt will improve 2 MWT by 267ft in order to demonstrate improved functional ambulatory capacity in community setting.  Baseline: See objective.  Goal status: INITIAL  3.  Pt will improve LEFS score by 20 points in order to demonstrate improved outcomes with functional tasks and ADLs. Baseline: See objective.  Goal status: INITIAL  4.  Pt will improve ABC scale by 20 points in order to demonstrated improved safety and balance during functional activities.. Baseline: See objective.  Goal status: INITIAL    PLAN:  PT FREQUENCY: 2x/week  PT DURATION: 6 weeks  PLANNED INTERVENTIONS: 97164- PT Re-evaluation, 97110-Therapeutic exercises, 97530- Therapeutic activity, 97112- Neuromuscular re-education, 97535- Self Care, 09811- Manual therapy, [id number]- Gait training, 97014- Electrical stimulation (unattended), [id number]- Electrical stimulation (manual), [id number]- Traction (mechanical), Balance training, Stair training, Cryotherapy, and Moist heat  PLAN FOR NEXT SESSION: ROM, early BLE strengthening, no active ER/IR of R hip currently, general strengthening  Becky Sax, LPTA/CLT; CBIS [phone]  Juel Burrow, PTA 07/18/2023, 12:02 PM

## 2023-07-19 ENCOUNTER — Ambulatory Visit: Payer: BLUE CROSS/BLUE SHIELD | Admitting: Family Medicine

## 2023-07-19 DIAGNOSIS — M1611 Unilateral primary osteoarthritis, right hip: Secondary | ICD-10-CM | POA: Diagnosis not present

## 2023-07-21 ENCOUNTER — Encounter (HOSPITAL_COMMUNITY): Payer: BLUE CROSS/BLUE SHIELD

## 2023-07-25 ENCOUNTER — Ambulatory Visit (HOSPITAL_COMMUNITY): Payer: Medicare Other

## 2023-07-25 ENCOUNTER — Encounter (HOSPITAL_COMMUNITY): Payer: Self-pay

## 2023-07-25 DIAGNOSIS — M6281 Muscle weakness (generalized): Secondary | ICD-10-CM

## 2023-07-25 DIAGNOSIS — Z7409 Other reduced mobility: Secondary | ICD-10-CM

## 2023-07-25 DIAGNOSIS — Z96641 Presence of right artificial hip joint: Secondary | ICD-10-CM

## 2023-07-25 NOTE — Therapy (Signed)
OUTPATIENT PHYSICAL THERAPY LOWER EXTREMITY TREATMENT   Patient Name: ANAMARIE HUNN MRN: [id number] DOB:[birth year], 78 y.o., female Today's Date: 07/25/2023  END OF SESSION:  PT End of Session - 07/25/23 1012     Visit Number 3    Number of Visits 12    Date for PT Re-Evaluation 09/04/23    Authorization Type Medicare A & B; BCBS supplement    Authorization Time Period no auth no limit    Progress Note Due on Visit 10    PT Start Time 0932    PT Stop Time 1012    PT Time Calculation (min) 40 min    Activity Tolerance Patient tolerated treatment well    Behavior During Therapy WFL for tasks assessed/performed               Past Medical History:  Diagnosis Date   Arthritis    Complication of anesthesia    Gall stones    GERD (gastroesophageal reflux disease)    History of cardiac catheterization 2005   Minor elevation in cardiac enzymes however no significant obstructive CAD   Hyperlipidemia    Hypertension    Insomnia    PONV (postoperative nausea and vomiting)    Prediabetes    Past Surgical History:  Procedure Laterality Date   A-FLUTTER ABLATION N/A 08/01/2017   Procedure: A-FLUTTER ABLATION;  Surgeon: Hillis Range, MD;  [facility]: [facility];  Service: Cardiovascular;  Laterality: N/A;   ABDOMINAL HYSTERECTOMY     BACK SURGERY  1992   CARDIAC CATHETERIZATION     BACK IN 2004  SHE THINKS IT CAME BACK 'NORMAL'   CHOLECYSTECTOMY N/A 06/24/2015   Procedure: LAPAROSCOPIC CHOLECYSTECTOMY;  Surgeon: Rodman Pickle, MD;  Location: [facility] OR;  Service: General;  Laterality: N/A;   COLONOSCOPY N/A 08/30/2012   Procedure: COLONOSCOPY;  Surgeon: Malissa Hippo, MD;  [facility];  Service: Endoscopy;  Laterality: N/A;  830   DILATION AND CURETTAGE OF UTERUS     ESOPHAGOGASTRODUODENOSCOPY  04/15/2011   Procedure: ESOPHAGOGASTRODUODENOSCOPY (EGD);  Surgeon: Malissa Hippo, MD;  [facility]: [facility];  Service: Endoscopy;  Laterality: N/A;  11:30    ESOPHAGOGASTRODUODENOSCOPY N/A 05/27/2016   Procedure: ESOPHAGOGASTRODUODENOSCOPY (EGD);  Surgeon: Malissa Hippo, MD;  [facility]: [facility];  Service: Endoscopy;  Laterality: N/A;  11:15   EYE SURGERY     cataract removal   NECK SURGERY  12/13/2018   REVERSE SHOULDER ARTHROPLASTY Left 04/03/2019   Procedure: REVERSE SHOULDER ARTHROPLASTY;  Surgeon: Bjorn Pippin, MD;  Location: [facility];  Service: Orthopedics;  Laterality: Left;   VAGINAL HYSTERECTOMY     Patient Active Problem List   Diagnosis Date Noted   Chronic right-sided low back pain with right-sided sciatica 04/12/2023   History of atrial flutter 04/20/2022   Paroxysmal SVT (supraventricular tachycardia) (HCC) 04/20/2022   Open wound of left lower leg 03/28/2022   Reactive arthritis (HCC) 12/28/2020   Arthritis 11/20/2020   Laryngopharyngeal reflux (LPR) 09/28/2020   Primary osteoarthritis of right hip 01/17/2020   Pedal edema 11/19/2019   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   Insomnia 03/15/2019   Hyperlipidemia 06/18/2014   Gout 06/18/2014   Osteopenia 10/28/2013   Hypertension 03/29/2011    PCP: Babs Sciara, MD  REFERRING PROVIDER:   Teryl Lucy, MD    REFERRING DIAG: R POST THR on 07/06/2023  THERAPY DIAG:  S/P total right hip arthroplasty  Impaired functional mobility, balance, gait, and endurance  Muscle weakness (generalized)  Rationale for Evaluation and Treatment: Rehabilitation  ONSET DATE: 07/06/2023  SUBJECTIVE:   SUBJECTIVE STATEMENT: Pt reports some achiness when transitioning out of sitting positions. Pain is about 4/10. No questions regarding HEP.  Eval:  Lateral approach total hip arthroplasty on 07/06/2023. Pt voices concerns with husband ability to assist with caring for THA due to potential dementia. Wife voices concerns with safety concerns for hip. Pt reports she feels safe on a physical level and does not feel she will be abused but is concerned for how  he treats/assists with level of care.   PERTINENT HISTORY: Reverse TSA Osteopenia HTN PAIN:  Are you having pain? Yes: NPRS scale: 4/10 Pain location: Right hip Pain description: dull/achy Aggravating factors: fast movements Relieving factors: OTC medications, Iceman  PRECAUTIONS: Other: No ER/IR and straight leg.   RED FLAGS: None   WEIGHT BEARING RESTRICTIONS: No  FALLS:  Has patient fallen in last 6 months? No   PATIENT GOALS: "do more on my own and get comfortable doing"   OBJECTIVE:  Note: Objective measures were completed at Evaluation unless otherwise noted.  PATIENT SURVEYS:  LEFS 22/80 = 27.5% ABC Scale: 1070/1600= 66.9%  COGNITION: Overall cognitive status: Within functional limits for tasks assessed     SENSATION: WFL  POSTURE: increased thoracic kyphosis  LOWER EXTREMITY ROM:  Active ROM Right eval Left eval  Hip flexion    Hip extension    Hip abduction    Hip adduction    Hip internal rotation    Hip external rotation    Knee flexion    Knee extension    Ankle dorsiflexion    Ankle plantarflexion    Ankle inversion    Ankle eversion     (Blank rows = not tested)  LOWER EXTREMITY MMT:  MMT Right eval Left eval  Hip flexion    Hip extension    Hip abduction    Hip adduction    Hip internal rotation    Hip external rotation    Knee flexion    Knee extension    Ankle dorsiflexion    Ankle plantarflexion    Ankle inversion    Ankle eversion     (Blank rows = not tested)   FUNCTIONAL TESTS:  30 seconds chair stand test:6x w/ 2 HHA on RW  2 minute walk test: 179ft  GAIT: Distance walked: 180ft Assistive device utilized: Environmental consultant - 2 wheeled Level of assistance: Modified independence Comments: mild right R circumduction during swing phase, limited knee flexion in R lower extremity                                                                                                                                TREATMENT DATE:   07/25/2023  -10x STS practice with no UE support at high-low table use of mirror for visual cues for symmetrical alignment -L side clamshells 2 x 20 with tactile cues for  reduced lumbar rotation compensation  -Supine bridges w/ hip abduction and RTB 2 x 10 with 3'' - neuromuscular re-education of gluteal musculature   07/18/23:   Reviewed goals Educated importance of HEP compliance for maximal benefits  Supine: Heel slide 10x Quad sets 10x 3" Bridge 10x 3" Isometric ball squeeze 10x 3" Isometric abduction against belt  Sitting: LAQ STS 10 required HHA Shown chair push ups  Reviewed sequence ambulating with QCx 40 ft 3 point sequence  07/10/2023 PT Evaluation   -Ted hose donned- educated mechanics and techniques with family members to assist -Educated on ADLs for donning/doffing pants independently with grabber in supine position.  -HEP below Supine bridge with 1 x 12 w/ 3 isometric hold   PATIENT EDUCATION:  Education details: PT Evaluation, findings, prognosis, frequency, attendance policy, and HEP if given.  Person educated: Patient Education method: Medical illustrator Education comprehension: verbalized understanding  HOME EXERCISE PROGRAM: Access Code: [email or web address] URL: [email or web address] Date: 07/10/2023 Prepared by: Starling Manns  Exercises - Seated Ankle Pumps  - 1 x daily - 7 x weekly - 2-3 sets - 20 reps - Seated Long Arc Quad  - 1 x daily - 7 x weekly - 2-3 sets - 20 reps - Supine Bridge  - 1 x daily - 7 x weekly - 2-3 sets - 10-12 reps - Supine Heel Slide  - 1 x daily - 7 x weekly - 2-3 sets - 20 reps - Supine Quad Set  - 1 x daily - 7 x weekly - 2-3 sets - 20 reps  Access Code: [email or web address] URL: [email or web address] Date: 07/18/2023 Prepared by: Becky Sax  Exercises - Supine Hip Adduction Isometric with Ball  - 2 x daily - 7 x weekly - 2 sets - 10 reps - 5" hold - Hooklying Isometric Hip Abduction with Belt   - 2 x daily - 7 x weekly - 2 sets - 10 reps - 5" hold Access Code: [email or web address] URL: [email or web address] Date: 07/25/2023 Prepared by: Starling Manns  Exercises - Clamshell  - 1 x daily - 7 x weekly - 3 sets - 10 reps - Supine Bridge with Resistance Band  - 1 x daily - 7 x weekly - 3 sets - 10 reps  ASSESSMENT:  CLINICAL IMPRESSION: Pt showing great returns with ambulation with QC, taking small steps without and shows significant antalgia. Pt with poor gluteus medius activation to date noted with lumbopelvic rotation during antigravity clamshells, included tactile cues and neuromuscular re-education to gluteus medius during CKC glute extension activities. Pt will benefit from skilled Physical Therapy services to address deficits/limitations in order to improve functional and QOL.       OBJECTIVE IMPAIRMENTS: Abnormal gait, decreased activity tolerance, decreased balance, decreased endurance, decreased mobility, difficulty walking, decreased ROM, decreased strength, postural dysfunction, and pain.   ACTIVITY LIMITATIONS: carrying, lifting, bending, sitting, standing, squatting, sleeping, stairs, transfers, bed mobility, toileting, dressing, and locomotion level  PARTICIPATION LIMITATIONS: shopping, community activity, occupation, and yard work  PERSONAL FACTORS: Age, Past/current experiences, and 3+ comorbidities: see above for details  are also affecting patient's functional outcome.   REHAB POTENTIAL: Good  CLINICAL DECISION MAKING: Stable/uncomplicated  EVALUATION COMPLEXITY: Low   GOALS: Goals reviewed with patient? No  SHORT TERM GOALS: Target date: 08/07/2023  Pt will be independent with HEP in order to demonstrate participation in Physical Therapy POC.  Baseline: Goal status: INITIAL  2.  Pt will 2/10 pain during mobility in order to demonstrate improved pain with functional activities.  Baseline:  Goal status: INITIAL  LONG TERM GOALS: Target date:  09/04/2023  Pt will improve 30 Second Chair Stand Test by >4 reps  in order to demonstrate improved functional strength to return to desired activities.  Baseline: See objective.  Goal status: INITIAL  2.  Pt will improve 2 MWT by 254ft in order to demonstrate improved functional ambulatory capacity in community setting.  Baseline: See objective.  Goal status: INITIAL  3.  Pt will improve LEFS score by 20 points in order to demonstrate improved outcomes with functional tasks and ADLs. Baseline: See objective.  Goal status: INITIAL  4.  Pt will improve ABC scale by 20 points in order to demonstrated improved safety and balance during functional activities.. Baseline: See objective.  Goal status: INITIAL    PLAN:  PT FREQUENCY: 2x/week  PT DURATION: 6 weeks  PLANNED INTERVENTIONS: 97164- PT Re-evaluation, 97110-Therapeutic exercises, 97530- Therapeutic activity, 97112- Neuromuscular re-education, 97535- Self Care, 75102- Manual therapy, [id number]- Gait training, 97014- Electrical stimulation (unattended), [id number]- Electrical stimulation (manual), [id number]- Traction (mechanical), Balance training, Stair training, Cryotherapy, and Moist heat  PLAN FOR NEXT SESSION: ROM, early BLE strengthening, no active ER/IR of R hip currently, no hip flexion past 90, lets cut out hip adduction for now and focus on hip abduction more.  general strengthening  Elie Goody, DPT [facility] [place] [phone] office  Nelida Meuse, PT 07/25/2023, 10:13 AM

## 2023-07-28 ENCOUNTER — Ambulatory Visit (HOSPITAL_COMMUNITY): Payer: Medicare Other

## 2023-07-28 DIAGNOSIS — Z7409 Other reduced mobility: Secondary | ICD-10-CM | POA: Diagnosis not present

## 2023-07-28 DIAGNOSIS — M6281 Muscle weakness (generalized): Secondary | ICD-10-CM

## 2023-07-28 DIAGNOSIS — Z96641 Presence of right artificial hip joint: Secondary | ICD-10-CM | POA: Diagnosis not present

## 2023-07-28 NOTE — Therapy (Signed)
OUTPATIENT PHYSICAL THERAPY LOWER EXTREMITY TREATMENT   Patient Name: Terri Douglas MRN: [id number] DOB:January 27, 1946, 78 y.o., female Today's Date: 07/28/2023  END OF SESSION:  PT End of Session - 07/28/23 0945     Visit Number 4    Number of Visits 12    Date for PT Re-Evaluation 09/04/23    Authorization Type Medicare A & B; BCBS supplement    Authorization Time Period no auth no limit    Progress Note Due on Visit 10    PT Start Time [phone]    PT Stop Time 0930    PT Time Calculation (min) 43 min    Activity Tolerance Patient tolerated treatment well    Behavior During Therapy WFL for tasks assessed/performed                Past Medical History:  Diagnosis Date   Arthritis    Complication of anesthesia    Gall stones    GERD (gastroesophageal reflux disease)    History of cardiac catheterization 2005   Minor elevation in cardiac enzymes however no significant obstructive CAD   Hyperlipidemia    Hypertension    Insomnia    PONV (postoperative nausea and vomiting)    Prediabetes    Past Surgical History:  Procedure Laterality Date   A-FLUTTER ABLATION N/A 08/01/2017   Procedure: A-FLUTTER ABLATION;  Surgeon: Hillis Range, MD;  Location: [facility];  Service: Cardiovascular;  Laterality: N/A;   ABDOMINAL HYSTERECTOMY     BACK SURGERY  1992   CARDIAC CATHETERIZATION     BACK IN 2004  SHE THINKS IT CAME BACK 'NORMAL'   CHOLECYSTECTOMY N/A 06/24/2015   Procedure: LAPAROSCOPIC CHOLECYSTECTOMY;  Surgeon: Rodman Pickle, MD;  Location: [facility] OR;  Service: General;  Laterality: N/A;   COLONOSCOPY N/A 08/30/2012   Procedure: COLONOSCOPY;  Surgeon: Malissa Hippo, MD;  [facility];  Service: Endoscopy;  Laterality: N/A;  830   DILATION AND CURETTAGE OF UTERUS     ESOPHAGOGASTRODUODENOSCOPY  04/15/2011   Procedure: ESOPHAGOGASTRODUODENOSCOPY (EGD);  Surgeon: Malissa Hippo, MD;  [facility]: [facility];  Service: Endoscopy;  Laterality: N/A;  11:30    ESOPHAGOGASTRODUODENOSCOPY N/A 05/27/2016   Procedure: ESOPHAGOGASTRODUODENOSCOPY (EGD);  Surgeon: Malissa Hippo, MD;  [facility];  Service: Endoscopy;  Laterality: N/A;  11:15   EYE SURGERY     cataract removal   NECK SURGERY  12/13/2018   REVERSE SHOULDER ARTHROPLASTY Left 04/03/2019   Procedure: REVERSE SHOULDER ARTHROPLASTY;  Surgeon: Bjorn Pippin, MD;  Location: [facility];  Service: Orthopedics;  Laterality: Left;   VAGINAL HYSTERECTOMY     Patient Active Problem List   Diagnosis Date Noted   Chronic right-sided low back pain with right-sided sciatica 04/12/2023   History of atrial flutter 04/20/2022   Paroxysmal SVT (supraventricular tachycardia) (HCC) 04/20/2022   Open wound of left lower leg 03/28/2022   Reactive arthritis (HCC) 12/28/2020   Arthritis 11/20/2020   Laryngopharyngeal reflux (LPR) 09/28/2020   Primary osteoarthritis of right hip 01/17/2020   Pedal edema 11/19/2019   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   Insomnia 03/15/2019   Hyperlipidemia 06/18/2014   Gout 06/18/2014   Osteopenia 10/28/2013   Hypertension 03/29/2011    PCP: Babs Sciara, MD  REFERRING PROVIDER:   Teryl Lucy, MD    REFERRING DIAG: R POST THR on 07/06/2023  THERAPY DIAG:  S/P total right hip arthroplasty  Impaired functional mobility, balance, gait, and  endurance  Muscle weakness (generalized)  Rationale for Evaluation and Treatment: Rehabilitation  ONSET DATE: 07/06/2023  SUBJECTIVE:   SUBJECTIVE STATEMENT: Pt reports some soreness still at her incision. Pt reports continuing to struggle with clamshells. Pt arriving 5 minutes late.    Eval:  Lateral approach total hip arthroplasty on 07/06/2023. Pt voices concerns with husband ability to assist with caring for THA due to potential dementia. Wife voices concerns with safety concerns for hip. Pt reports she feels safe on a physical level and does not feel she will be abused but is  concerned for how he treats/assists with level of care.   PERTINENT HISTORY: Reverse TSA Osteopenia HTN PAIN:  Are you having pain? Yes: NPRS scale: 4/10 Pain location: Right hip Pain description: dull/achy Aggravating factors: fast movements Relieving factors: OTC medications, Iceman  PRECAUTIONS: Other: No ER/IR and straight leg.   RED FLAGS: None   WEIGHT BEARING RESTRICTIONS: No  FALLS:  Has patient fallen in last 6 months? No   PATIENT GOALS: "do more on my own and get comfortable doing"   OBJECTIVE:  Note: Objective measures were completed at Evaluation unless otherwise noted.  PATIENT SURVEYS:  LEFS 22/80 = 27.5% ABC Scale: 1070/1600= 66.9%  COGNITION: Overall cognitive status: Within functional limits for tasks assessed     SENSATION: WFL  POSTURE: increased thoracic kyphosis  LOWER EXTREMITY ROM:  Active ROM Right eval Left eval  Hip flexion    Hip extension    Hip abduction    Hip adduction    Hip internal rotation    Hip external rotation    Knee flexion    Knee extension    Ankle dorsiflexion    Ankle plantarflexion    Ankle inversion    Ankle eversion     (Blank rows = not tested)  LOWER EXTREMITY MMT:  MMT Right eval Left eval  Hip flexion    Hip extension    Hip abduction    Hip adduction    Hip internal rotation    Hip external rotation    Knee flexion    Knee extension    Ankle dorsiflexion    Ankle plantarflexion    Ankle inversion    Ankle eversion     (Blank rows = not tested)   FUNCTIONAL TESTS:  30 seconds chair stand test:6x w/ 2 HHA on RW  2 minute walk test: 130ft  GAIT: Distance walked: 155ft Assistive device utilized: Environmental consultant - 2 wheeled Level of assistance: Modified independence Comments: mild right R circumduction during swing phase, limited knee flexion in R lower extremity                                                                                                                                 TREATMENT DATE:  07/28/2023  -side lying clamshells 10x3'' -Supine bridge with GTB @ knee 2x10 w/ 3'' -Heel raise on decline x 20 with BUE support @ //  bars -3 Way Hip bilaterally @ // bars 2 x 5 -Bosu lunges in // bars x 10 bilaterally -Side stepping in // bars for 5' without UE support, cues for symmetrical side stepping and reducing sliding compensation  07/25/2023  -10x STS practice with no UE support at high-low table use of mirror for visual cues for symmetrical alignment -L side clamshells 2 x 20 with tactile cues for reduced lumbar rotation compensation  -Supine bridges w/ hip abduction and RTB 2 x 10 with 3'' - neuromuscular re-education of gluteal musculature   07/18/23:   Reviewed goals Educated importance of HEP compliance for maximal benefits  Supine: Heel slide 10x Quad sets 10x 3" Bridge 10x 3" Isometric ball squeeze 10x 3" Isometric abduction against belt  Sitting: LAQ STS 10 required HHA Shown chair push ups  Reviewed sequence ambulating with QCx 40 ft 3 point sequence   PATIENT EDUCATION:  Education details: PT Evaluation, findings, prognosis, frequency, attendance policy, and HEP if given.  Person educated: Patient Education method: Medical illustrator Education comprehension: verbalized understanding  HOME EXERCISE PROGRAM: Access Code: [email or web address] URL: [email or web address] Date: 07/10/2023 Prepared by: Starling Manns  Exercises - Seated Ankle Pumps  - 1 x daily - 7 x weekly - 2-3 sets - 20 reps - Seated Long Arc Quad  - 1 x daily - 7 x weekly - 2-3 sets - 20 reps - Supine Bridge  - 1 x daily - 7 x weekly - 2-3 sets - 10-12 reps - Supine Heel Slide  - 1 x daily - 7 x weekly - 2-3 sets - 20 reps - Supine Quad Set  - 1 x daily - 7 x weekly - 2-3 sets - 20 reps  Access Code: [email or web address] URL: [email or web address] Date: 07/18/2023 Prepared by: Becky Sax  Exercises - Supine Hip Adduction Isometric with  Ball  - 2 x daily - 7 x weekly - 2 sets - 10 reps - 5" hold - Hooklying Isometric Hip Abduction with Belt  - 2 x daily - 7 x weekly - 2 sets - 10 reps - 5" hold Access Code: [email or web address] URL: [email or web address] Date: 07/25/2023 Prepared by: Starling Manns  Exercises - Clamshell  - 1 x daily - 7 x weekly - 3 sets - 10 reps - Supine Bridge with Resistance Band  - 1 x daily - 7 x weekly - 3 sets - 10 reps  ASSESSMENT:  CLINICAL IMPRESSION: Pt continues to demonstrate weak R glute medius and other R hip musculature throughout session. Pt demonstrates improved ability to complete increased number and intensity of exercises today compared to last session. Pt also demonstrates improved gait pattern following session today, showing less need for AD. Marland Kitchen However pt was encouraged to continue with the Quad cane and will plan to wean off as hip musculature improves. RLE swelling noted especially in the R ankle, pt was educated on inflammation control and the importance of elevating RLE throughout the day and the donning of compression stocking. Pt will benefit from skilled Physical Therapy services to address deficits/limitations in order to improve functional and QOL.       OBJECTIVE IMPAIRMENTS: Abnormal gait, decreased activity tolerance, decreased balance, decreased endurance, decreased mobility, difficulty walking, decreased ROM, decreased strength, postural dysfunction, and pain.   ACTIVITY LIMITATIONS: carrying, lifting, bending, sitting, standing, squatting, sleeping, stairs, transfers, bed mobility, toileting, dressing, and locomotion level  PARTICIPATION LIMITATIONS: shopping, community activity, occupation, and yard work  PERSONAL FACTORS: Age, Past/current experiences, and 3+ comorbidities: see above for  details  are also affecting patient's functional outcome.   REHAB POTENTIAL: Good  CLINICAL DECISION MAKING: Stable/uncomplicated  EVALUATION COMPLEXITY:  Low   GOALS: Goals reviewed with patient? No  SHORT TERM GOALS: Target date: 08/07/2023  Pt will be independent with HEP in order to demonstrate participation in Physical Therapy POC.  Baseline: Goal status: INITIAL  2.  Pt will 2/10 pain during mobility in order to demonstrate improved pain with functional activities.  Baseline:  Goal status: INITIAL  LONG TERM GOALS: Target date: 09/04/2023  Pt will improve 30 Second Chair Stand Test by >4 reps  in order to demonstrate improved functional strength to return to desired activities.  Baseline: See objective.  Goal status: INITIAL  2.  Pt will improve 2 MWT by 232ft in order to demonstrate improved functional ambulatory capacity in community setting.  Baseline: See objective.  Goal status: INITIAL  3.  Pt will improve LEFS score by 20 points in order to demonstrate improved outcomes with functional tasks and ADLs. Baseline: See objective.  Goal status: INITIAL  4.  Pt will improve ABC scale by 20 points in order to demonstrated improved safety and balance during functional activities.. Baseline: See objective.  Goal status: INITIAL    PLAN:  PT FREQUENCY: 2x/week  PT DURATION: 6 weeks  PLANNED INTERVENTIONS: 97164- PT Re-evaluation, 97110-Therapeutic exercises, 97530- Therapeutic activity, 97112- Neuromuscular re-education, 97535- Self Care, 60454- Manual therapy, [id number]- Gait training, 97014- Electrical stimulation (unattended), [id number]- Electrical stimulation (manual), [id number]- Traction (mechanical), Balance training, Stair training, Cryotherapy, and Moist heat  PLAN FOR NEXT SESSION: ROM, early BLE strengthening, no active ER/IR of R hip currently, no hip flexion past 90, lets cut out hip adduction for now and focus on hip abduction more.  general strengthening  Elie Goody, DPT [facility] [place] [phone] office  Nelida Meuse, PT 07/28/2023, 10:50 AM

## 2023-07-31 ENCOUNTER — Encounter (HOSPITAL_COMMUNITY): Payer: Self-pay

## 2023-07-31 ENCOUNTER — Ambulatory Visit (HOSPITAL_COMMUNITY): Payer: Medicare Other

## 2023-07-31 DIAGNOSIS — Z7409 Other reduced mobility: Secondary | ICD-10-CM | POA: Diagnosis not present

## 2023-07-31 DIAGNOSIS — M6281 Muscle weakness (generalized): Secondary | ICD-10-CM | POA: Diagnosis not present

## 2023-07-31 DIAGNOSIS — Z96641 Presence of right artificial hip joint: Secondary | ICD-10-CM | POA: Diagnosis not present

## 2023-07-31 NOTE — Therapy (Signed)
 OUTPATIENT PHYSICAL THERAPY LOWER EXTREMITY TREATMENT   Patient Name: Terri Douglas MRN: [id number] DOB:10/11/1945, 78 y.o., female Today's Date: 07/31/2023  END OF SESSION:  PT End of Session - 07/31/23 1011     Visit Number 5    Number of Visits 12    Date for PT Re-Evaluation 09/04/23    Authorization Type Medicare A & B; BCBS supplement    Authorization Time Period no auth no limit    Progress Note Due on Visit 10    PT Start Time 0932    PT Stop Time 1012    PT Time Calculation (min) 40 min    Activity Tolerance Patient tolerated treatment well    Behavior During Therapy WFL for tasks assessed/performed                 Past Medical History:  Diagnosis Date   Arthritis    Complication of anesthesia    Gall stones    GERD (gastroesophageal reflux disease)    History of cardiac catheterization 2005   Minor elevation in cardiac enzymes however no significant obstructive CAD   Hyperlipidemia    Hypertension    Insomnia    PONV (postoperative nausea and vomiting)    Prediabetes    Past Surgical History:  Procedure Laterality Date   A-FLUTTER ABLATION N/A 08/01/2017   Procedure: A-FLUTTER ABLATION;  Surgeon: Hillis Range, MD;  Location: [facility];  Service: Cardiovascular;  Laterality: N/A;   ABDOMINAL HYSTERECTOMY     BACK SURGERY  1992   CARDIAC CATHETERIZATION     BACK IN 2004  SHE THINKS IT CAME BACK 'NORMAL'   CHOLECYSTECTOMY N/A 06/24/2015   Procedure: LAPAROSCOPIC CHOLECYSTECTOMY;  Surgeon: Rodman Pickle, MD;  Location: [facility] OR;  Service: General;  Laterality: N/A;   COLONOSCOPY N/A 08/30/2012   Procedure: COLONOSCOPY;  Surgeon: Malissa Hippo, MD;  Location: [facility];  Service: Endoscopy;  Laterality: N/A;  830   DILATION AND CURETTAGE OF UTERUS     ESOPHAGOGASTRODUODENOSCOPY  04/15/2011   Procedure: ESOPHAGOGASTRODUODENOSCOPY (EGD);  Surgeon: Malissa Hippo, MD;  Location: [facility];  Service: Endoscopy;  Laterality: N/A;  11:30    ESOPHAGOGASTRODUODENOSCOPY N/A 05/27/2016   Procedure: ESOPHAGOGASTRODUODENOSCOPY (EGD);  Surgeon: Malissa Hippo, MD;  Location: [facility];  Service: Endoscopy;  Laterality: N/A;  11:15   EYE SURGERY     cataract removal   NECK SURGERY  12/13/2018   REVERSE SHOULDER ARTHROPLASTY Left 04/03/2019   Procedure: REVERSE SHOULDER ARTHROPLASTY;  Surgeon: Bjorn Pippin, MD;  Location: [facility];  Service: Orthopedics;  Laterality: Left;   VAGINAL HYSTERECTOMY     Patient Active Problem List   Diagnosis Date Noted   Chronic right-sided low back pain with right-sided sciatica 04/12/2023   History of atrial flutter 04/20/2022   Paroxysmal SVT (supraventricular tachycardia) (HCC) 04/20/2022   Open wound of left lower leg 03/28/2022   Reactive arthritis (HCC) 12/28/2020   Arthritis 11/20/2020   Laryngopharyngeal reflux (LPR) 09/28/2020   Primary osteoarthritis of right hip 01/17/2020   Pedal edema 11/19/2019   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   Insomnia 03/15/2019   Hyperlipidemia 06/18/2014   Gout 06/18/2014   Osteopenia 10/28/2013   Hypertension 03/29/2011    PCP: Babs Sciara, MD  REFERRING PROVIDER:   Teryl Lucy, MD  Next apt 08/16/23  REFERRING DIAG: R POST THR on 07/06/2023  THERAPY DIAG:  S/P total right hip arthroplasty  Impaired functional mobility,  balance, gait, and endurance  Muscle weakness (generalized)  Rationale for Evaluation and Treatment: Rehabilitation  ONSET DATE: 07/06/2023  SUBJECTIVE:   SUBJECTIVE STATEMENT: Pt arrived with a smile, stated she has been practicing on walking without AD around house.  Reports the clamshell exercise continues to be difficult.  Pain scale 2-3/10.  Eval:  Lateral approach total hip arthroplasty on 07/06/2023. Pt voices concerns with husband ability to assist with caring for THA due to potential dementia. Wife voices concerns with safety concerns for hip. Pt reports she feels safe on a  physical level and does not feel she will be abused but is concerned for how he treats/assists with level of care.   PERTINENT HISTORY: Reverse TSA Osteopenia HTN PAIN:  Are you having pain? Yes: NPRS scale: 2/10 Pain location: Right hip Pain description: dull/achy Aggravating factors: fast movements Relieving factors: OTC medications, Iceman  PRECAUTIONS: Other: No ER/IR and straight leg.   RED FLAGS: None   WEIGHT BEARING RESTRICTIONS: No  FALLS:  Has patient fallen in last 6 months? No   PATIENT GOALS: "do more on my own and get comfortable doing"   OBJECTIVE:  Note: Objective measures were completed at Evaluation unless otherwise noted.  PATIENT SURVEYS:  LEFS 22/80 = 27.5% ABC Scale: 1070/1600= 66.9%  COGNITION: Overall cognitive status: Within functional limits for tasks assessed     SENSATION: WFL  POSTURE: increased thoracic kyphosis  LOWER EXTREMITY ROM:  Active ROM Right eval Left eval  Hip flexion    Hip extension    Hip abduction    Hip adduction    Hip internal rotation    Hip external rotation    Knee flexion    Knee extension    Ankle dorsiflexion    Ankle plantarflexion    Ankle inversion    Ankle eversion     (Blank rows = not tested)  LOWER EXTREMITY MMT:  MMT Right eval Left eval  Hip flexion    Hip extension    Hip abduction    Hip adduction    Hip internal rotation    Hip external rotation    Knee flexion    Knee extension    Ankle dorsiflexion    Ankle plantarflexion    Ankle inversion    Ankle eversion     (Blank rows = not tested)   FUNCTIONAL TESTS:  30 seconds chair stand test:6x w/ 2 HHA on RW  2 minute walk test: 119ft  GAIT: Distance walked: 114ft Assistive device utilized: Environmental consultant - 2 wheeled Level of assistance: Modified independence Comments: mild right R circumduction during swing phase, limited knee flexion in R lower extremity                                                                                                                                 TREATMENT DATE:  07/31/23 Gait with no AD x 200 ft Standing:  Isometric abduction against wall 10x  3" holds Minisquats front of chair 10x Sidestep with GTB around thigh 3RT with knees straight 3RT in minisquat position inside // bars SLS Lt 9", Rt 1-2" max of 3 Vector stance Bil with light HHA 5x 5" Supine:  Bridge with GTB around thigh 20x 5" (cueing to engage glut med prior raise) Sidelying:  clam no resistance with Rt LE, GTB with Lt 20x 5"  07/28/2023  -sidelying clamshells 10x3'' -Supine bridge with GTB @ knee 2x10 w/ 3'' -Heel raise on decline x 20 with BUE support @ // bars -3 Way Hip bilaterally @ // bars 2 x 5 -Bosu lunges in // bars x 10 bilaterally -Side stepping in // bars for 5' without UE support, cues for symmetrical side stepping and reducing sliding compensation  07/25/2023  -10x STS practice with no UE support at high-low table use of mirror for visual cues for symmetrical alignment -L side clamshells 2 x 20 with tactile cues for reduced lumbar rotation compensation  -Supine bridges w/ hip abduction and RTB 2 x 10 with 3'' - neuromuscular re-education of gluteal musculature   07/18/23:   Reviewed goals Educated importance of HEP compliance for maximal benefits  Supine: Heel slide 10x Quad sets 10x 3" Bridge 10x 3" Isometric ball squeeze 10x 3" Isometric abduction against belt  Sitting: LAQ STS 10 required HHA Shown chair push ups  Reviewed sequence ambulating with QCx 40 ft 3 point sequence   PATIENT EDUCATION:  Education details: PT Evaluation, findings, prognosis, frequency, attendance policy, and HEP if given.  Person educated: Patient Education method: Medical illustrator Education comprehension: verbalized understanding  HOME EXERCISE PROGRAM: Access Code: [email or web address] URL: [email or web address] Date: 07/10/2023 Prepared by: Starling Manns  Exercises - Seated  Ankle Pumps  - 1 x daily - 7 x weekly - 2-3 sets - 20 reps - Seated Long Arc Quad  - 1 x daily - 7 x weekly - 2-3 sets - 20 reps - Supine Bridge  - 1 x daily - 7 x weekly - 2-3 sets - 10-12 reps - Supine Heel Slide  - 1 x daily - 7 x weekly - 2-3 sets - 20 reps - Supine Quad Set  - 1 x daily - 7 x weekly - 2-3 sets - 20 reps  Access Code: [email or web address] URL: [email or web address] Date: 07/18/2023 Prepared by: Becky Sax  Exercises - Supine Hip Adduction Isometric with Ball  - 2 x daily - 7 x weekly - 2 sets - 10 reps - 5" hold - Hooklying Isometric Hip Abduction with Belt  - 2 x daily - 7 x weekly - 2 sets - 10 reps - 5" hold Access Code: [email or web address] URL: [email or web address] Date: 07/25/2023 Prepared by: Starling Manns  Exercises - Clamshell  - 1 x daily - 7 x weekly - 3 sets - 10 reps - Supine Bridge with Resistance Band  - 1 x daily - 7 x weekly - 3 sets - 10 reps  ASSESSMENT:  CLINICAL IMPRESSION: Session focus on hip strengthening.  Pt arrived with QC through full session complete with no AD and minimal HHA.  Pt able to demonstrate equal stride length with minimal cueing to improve arm swing during gait and no LOB.  Added isometric abduction and balance associated exercises for glut activation with some cueing to reduce compensation and for form.  Pt tolerated well to session with no reports of increased pain, was limited by fatigue with activities.  Pt does continue to demonstrate weak gluteal mm, noted with increased difficulty  with clam exercise without resistance.      OBJECTIVE IMPAIRMENTS: Abnormal gait, decreased activity tolerance, decreased balance, decreased endurance, decreased mobility, difficulty walking, decreased ROM, decreased strength, postural dysfunction, and pain.   ACTIVITY LIMITATIONS: carrying, lifting, bending, sitting, standing, squatting, sleeping, stairs, transfers, bed mobility, toileting, dressing, and locomotion  level  PARTICIPATION LIMITATIONS: shopping, community activity, occupation, and yard work  PERSONAL FACTORS: Age, Past/current experiences, and 3+ comorbidities: see above for details  are also affecting patient's functional outcome.   REHAB POTENTIAL: Good  CLINICAL DECISION MAKING: Stable/uncomplicated  EVALUATION COMPLEXITY: Low   GOALS: Goals reviewed with patient? No  SHORT TERM GOALS: Target date: 08/07/2023  Pt will be independent with HEP in order to demonstrate participation in Physical Therapy POC.  Baseline: Goal status: INITIAL  2.  Pt will 2/10 pain during mobility in order to demonstrate improved pain with functional activities.  Baseline:  Goal status: INITIAL  LONG TERM GOALS: Target date: 09/04/2023  Pt will improve 30 Second Chair Stand Test by >4 reps  in order to demonstrate improved functional strength to return to desired activities.  Baseline: See objective.  Goal status: INITIAL  2.  Pt will improve 2 MWT by 262ft in order to demonstrate improved functional ambulatory capacity in community setting.  Baseline: See objective.  Goal status: INITIAL  3.  Pt will improve LEFS score by 20 points in order to demonstrate improved outcomes with functional tasks and ADLs. Baseline: See objective.  Goal status: INITIAL  4.  Pt will improve ABC scale by 20 points in order to demonstrated improved safety and balance during functional activities.. Baseline: See objective.  Goal status: INITIAL    PLAN:  PT FREQUENCY: 2x/week  PT DURATION: 6 weeks  PLANNED INTERVENTIONS: 97164- PT Re-evaluation, 97110-Therapeutic exercises, 97530- Therapeutic activity, 97112- Neuromuscular re-education, 97535- Self Care, 16109- Manual therapy, [id number]- Gait training, 97014- Electrical stimulation (unattended), [id number]- Electrical stimulation (manual), [id number]- Traction (mechanical), Balance training, Stair training, Cryotherapy, and Moist heat  PLAN FOR NEXT SESSION: ROM, early  BLE strengthening, no active ER/IR of R hip currently, no hip flexion past 90, lets cut out hip adduction for now and focus on hip abduction more.  general strengthening  Becky Sax, LPTA/CLT; CBIS [phone]   Juel Burrow, PTA 07/31/2023, 11:13 AM

## 2023-08-02 ENCOUNTER — Ambulatory Visit
Admit: 2023-08-02 | Discharge: 2023-08-02 | Payer: MEDICARE | Attending: Cardiovascular Disease | Primary: Physician Assistant

## 2023-08-02 VITALS — BP 183/105 | HR 70 | Ht 66.0 in | Wt 192.6 lb

## 2023-08-02 DIAGNOSIS — I471 Supraventricular tachycardia, unspecified: Secondary | ICD-10-CM

## 2023-08-02 MED ORDER — HYDRALAZINE HCL 50 MG PO TABS
50 | ORAL_TABLET | Freq: Three times a day (TID) | ORAL | 1 refills | Status: DC
Start: 2023-08-02 — End: 2023-08-03

## 2023-08-02 NOTE — Progress Notes (Signed)
 Have you had Fatigue?  No    2.   Have you had have you had Chest Pain? No     3.   Have you had Dyspnea (SOB) ? No   4.   Have you had Orthopnea? No   5.   Have you had PND? No   6.   Have you had leg swelling? Yes ankles at night  7.    Have you had any weight gain? No   8. Have you had any palpitations? No     9. Have you had any syncope? No   10. Do you have any wounds on legs?no

## 2023-08-02 NOTE — Progress Notes (Signed)
 HISTORY OF PRESENT ILLNESS  Krystal Hood is a 78 y.o. female.    PMH HTN, OSA on CPAP  ----  CARDIAC STUDIES  ----  2d echo 01/04/2023    Left Ventricle: Normal left ventricular systolic function with a visually estimated EF of 55 - 60%. Left ventricle size is normal. Normal wall thickness. Normal wall motion. Normal diastolic function.    Tricuspid Valve: Unable to assess RVSP due to inadequate or insignificant tricuspid regurgitation.    Aorta: Normal sized aortic root. Mildly dilated ascending aorta. Ao ascending diameter is 3.5 cm.    Image quality is adequate.  ----  Heart monitor ziopatch 09/2022  SVT present with heart rate 126bpm to 140bpm  PACs occasional and PVC rare  167 SVT runs with the fastest interval lasting 2 minutes 56 seconds and max rate of 200bpm longest lasting 45 minutes 1 second with average rate of 133bpm, idioventricular rhythm.    ----  2d echo 09/2016  Stress 2D echocardiographic results  Baseline: Left ventricular size was normal. Overall left ventricular systolic   function was normal. Estimated left  ventricular ejection fraction was 60 % .     Peak stress: LV size was unchanged from the previous stage to mildly dilated   in some views. There was no change in LV  function since the previous stage with some hypokinesia in the inferior wall  ----  CTA of the chest 09/13/2022  No evidence of pulmonary embolism or other acute process.   ----  Duplex renal arteries 09/14/2016  INTERPRETATION/FINDINGS   Duplex images were obtained using 2-D gray scale, color flow, and   spectral Doppler analysis.   RENAL:   1. No significant renal artery stenosis identified, both renal   arteries visualized.   2. The right kidney measures 10.3 cm.   3. The left kidney measures 10.6 cm.   4. The renal vein is patent bilaterally.   5.  No evidence of aneurysm noted in the abdominal aorta.   ----    Have you had Fatigue?  No     2.   Have you had have you had Chest Pain? No      3.   Have you had Dyspnea (SOB) ? No    4.   Have you had Orthopnea? No   5.   Have you had PND? No   6.   Have you had leg swelling? Yes ankles at night  7.    Have you had any weight gain? No   8. Have you had any palpitations? No     9. Have you had any syncope? No   10. Do you have any wounds on legs?no       Reviewed with the patient and is as such.      Family History   Problem Relation Age of Onset    Cancer Mother 52        lymphoma    Heart Attack Father     Heart Attack Sister        Past Medical History:   Diagnosis Date    Hypertension     Thyroid disease     hypothyroidism    Unspecified sleep apnea     Doesn't use CPAP       Past Surgical History:   Procedure Laterality Date    KNEE ARTHROSCOPY Right     OTHER SURGICAL HISTORY      Fatty removed from left ankle  TONSILLECTOMY AND ADENOIDECTOMY      TUBAL LIGATION         Social History     Tobacco Use    Smoking status: Former    Smokeless tobacco: Never   Substance Use Topics    Alcohol use: No       Allergies   Allergen Reactions    Hydromorphone Nausea And Vomiting    Meperidine Nausea And Vomiting       Prior to Admission medications    Medication Sig Start Date End Date Taking? Authorizing Provider   hydrALAZINE (APRESOLINE) 25 MG tablet Take 1 tablet by mouth 4 times daily 01/20/23   Sandre Kitty, MD   atorvastatin (LIPITOR) 40 MG tablet Take 1 tablet by mouth daily    [provider]   chlorthalidone (HYGROTON) 25 MG tablet Take 1 tablet by mouth daily    [provider]   losartan (COZAAR) 100 MG tablet Take 1 tablet by mouth daily    [provider]   sertraline (ZOLOFT) 100 MG tablet Take 1 tablet by mouth daily    [provider]   metoprolol succinate (TOPROL XL) 25 MG extended release tablet Take 1 tablet by mouth daily    [provider]   Levothyroxine Sodium 112 MCG CAPS Take 112 mcg by mouth Daily    Automatic Reconciliation, Ar       No results found for: "LIPIDPAN", "BMP", "CMP"     There were no vitals taken for this  visit.    HPI    Review of Systems   Constitutional:  Negative for activity change, appetite change, diaphoresis, fatigue and unexpected weight change.   Eyes:  Negative for visual disturbance.   Respiratory:  Negative for apnea, cough, chest tightness, shortness of breath and wheezing.    Cardiovascular:  Negative for chest pain, palpitations and leg swelling.   Gastrointestinal:  Negative for abdominal pain, blood in stool, constipation, diarrhea and nausea.   Endocrine: Negative for cold intolerance and heat intolerance.   Genitourinary:  Negative for decreased urine volume and difficulty urinating.   Musculoskeletal:  Negative for gait problem, myalgias and neck pain.   Skin:  Negative for color change, pallor and rash.   Neurological:  Negative for dizziness, syncope, facial asymmetry, speech difficulty, weakness, light-headedness and numbness.   Psychiatric/Behavioral:  Negative for confusion and sleep disturbance.          Physical Exam  Vitals reviewed.   Constitutional:       General: She is not in acute distress.     Appearance: Normal appearance. She is not ill-appearing or diaphoretic.   HENT:      Head: Normocephalic and atraumatic.   Eyes:      General: No scleral icterus.        Right eye: No discharge.         Left eye: No discharge.      Conjunctiva/sclera: Conjunctivae normal.   Neck:      Vascular: No carotid bruit.   Cardiovascular:      Rate and Rhythm: Normal rate and regular rhythm.      Heart sounds: Normal heart sounds. No murmur heard.     No friction rub. No gallop.   Pulmonary:      Effort: Pulmonary effort is normal. No respiratory distress.      Breath sounds: Normal breath sounds. No wheezing, rhonchi or rales.   Chest:      Chest wall: No  tenderness.   Abdominal:      General: Bowel sounds are normal. There is no distension.      Palpations: Abdomen is soft.      Tenderness: There is no abdominal tenderness. There is no guarding.   Musculoskeletal:         General: No swelling or  tenderness.      Cervical back: Neck supple.      Right lower leg: No edema.      Left lower leg: No edema.   Skin:     General: Skin is warm and dry.      Findings: No erythema or rash.   Neurological:      Mental Status: She is alert. Mental status is at baseline.      Motor: No weakness.      Gait: Gait normal.   Psychiatric:         Mood and Affect: Mood normal.         Behavior: Behavior normal.         Thought Content: Thought content normal.         ASSESSMENT and PLAN  Ms. Schnee has a reminder for a "due or due soon" health maintenance. I have asked that she contact her primary care prov  ider for follow-up on this health maintenance.    SVT - Continue with metoprolol succinate 25mg  po daily, states that palpitations well controlled.  Without atrial fibrillation present, can discontinue eliquis.  Patient presented picture of monitor that shows atrial tachycardia at 127bpm that was regular from the emergency department.  2D echo 12/2022 with preserved ejection fraction 55 to 60 percent.      HTN - Stage II pressure, continue with metoprolol succinate 25mg  po daily, continue with losartan 100mg  po daily, continue chlorthalidone 68m mg po daily, Continue hydralazine but increase 50mg  po TID evaluate if can tolerate.      HLD - Continue with lipitor 40mg  po daily, survey lipid panel periodically.      OSA on CPAP

## 2023-08-02 NOTE — Patient Instructions (Signed)
 Learning About the Mediterranean Diet  What is the Mediterranean diet?     The Mediterranean diet is a style of eating rather than a diet plan. It features foods eaten in [place], [place], southern [place] and [place], and other countries along the [employer]. It emphasizes eating foods like fish, fruits, vegetables, beans, high-fiber breads and whole grains, nuts, and olive oil. This style of eating includes limited red meat, cheese, and sweets.  Why choose the Mediterranean diet?  A Mediterranean-style diet may improve heart health. It contains more fat than other heart-healthy diets. But the fats are mainly from nuts, unsaturated oils (such as fish oils and olive oil), and certain nut or seed oils (such as canola, soybean, or flaxseed oil). These fats may help protect the heart and blood vessels.  How can you get started on the Mediterranean diet?  Here are some things you can do to switch to a more Mediterranean way of eating.  What to eat  Eat a variety of fruits and vegetables each day, such as grapes, blueberries, tomatoes, broccoli, peppers, figs, olives, spinach, eggplant, beans, lentils, and chickpeas.  Eat a variety of whole-grain foods each day, such as oats, brown rice, and whole wheat bread, pasta, and couscous.  Eat fish at least 2 times a week. Try tuna, salmon, mackerel, lake trout, herring, or sardines.  Eat moderate amounts of low-fat dairy products, such as milk, cheese, or yogurt.  Eat moderate amounts of poultry and eggs.  Choose healthy (unsaturated) fats, such as nuts, olive oil, and certain nut or seed oils like canola, soybean, and flaxseed.  Limit unhealthy (saturated) fats, such as butter, palm oil, and coconut oil. And limit fats found in animal products, such as meat and dairy products made with whole milk. Try to eat red meat only a few times a month in very small amounts.  Limit sweets and desserts to only a few times a week. This includes sugar-sweetened drinks like soda.  The  Mediterranean diet may also include red wine with your meal--1 glass each day for women and up to 2 glasses a day for men.  Tips for eating at home  Use herbs, spices, garlic, lemon zest, and citrus juice instead of salt to add flavor to foods.  Add avocado slices to your sandwich instead of bacon.  Have fish for lunch or dinner instead of red meat. Brush the fish with olive oil, and broil or grill it.  Sprinkle your salad with seeds or nuts instead of cheese.  Cook with olive or canola oil instead of butter or oils that are high in saturated fat.  Switch from 2% milk or whole milk to 1% or fat-free milk.  Dip raw vegetables in a vinaigrette dressing or hummus instead of dips made from mayonnaise or sour cream.  Have a piece of fruit for dessert instead of a piece of cake. Try baked apples, or have some dried fruit.  Tips for eating out  Try broiled, grilled, baked, or poached fish instead of having it fried or breaded.  Ask your server to have your meals prepared with olive oil instead of butter.  Order dishes made with marinara sauce or sauces made from olive oil. Avoid sauces made from cream or mayonnaise.  Choose whole-grain breads, whole wheat pasta and pizza crust, brown rice, beans, and lentils.  Cut back on butter or margarine on bread. Instead, you can dip your bread in a small amount of olive oil.  Ask for a side  salad or grilled vegetables instead of french fries or chips.  Where can you learn more?  Go to [employer] and enter O407 to learn more about "Learning About the Mediterranean Diet."  Current as of: Oct 12, 2020               Content Version: 13.5   2006-2022 [employer].   Care instructions adapted under license by [facility]. If you have questions about a medical condition or this instruction, always ask your healthcare professional. [employer] disclaims any warranty or liability for your use of this information.

## 2023-08-03 ENCOUNTER — Encounter (HOSPITAL_COMMUNITY): Payer: Self-pay

## 2023-08-03 ENCOUNTER — Ambulatory Visit (HOSPITAL_COMMUNITY): Payer: Medicare Other

## 2023-08-03 DIAGNOSIS — Z7409 Other reduced mobility: Secondary | ICD-10-CM

## 2023-08-03 DIAGNOSIS — Z96641 Presence of right artificial hip joint: Secondary | ICD-10-CM | POA: Diagnosis not present

## 2023-08-03 DIAGNOSIS — M6281 Muscle weakness (generalized): Secondary | ICD-10-CM | POA: Diagnosis not present

## 2023-08-03 MED ORDER — HYDRALAZINE HCL 50 MG PO TABS
50 | ORAL_TABLET | Freq: Three times a day (TID) | ORAL | 0 refills | Status: AC
Start: 2023-08-03 — End: ?

## 2023-08-03 NOTE — Telephone Encounter (Signed)
 na

## 2023-08-03 NOTE — Therapy (Signed)
 OUTPATIENT PHYSICAL THERAPY LOWER EXTREMITY TREATMENT   Patient Name: Terri Douglas MRN: [id number] DOB:1945-08-12, 78 y.o., female Today's Date: 08/03/2023  END OF SESSION:  PT End of Session - 08/03/23 1014     Visit Number 6    Number of Visits 12    Date for PT Re-Evaluation 09/04/23    Authorization Type Medicare A & B; BCBS supplement    Authorization Time Period no auth no limit    Progress Note Due on Visit 10    PT Start Time 0935    PT Stop Time 1014    PT Time Calculation (min) 39 min    Activity Tolerance Patient tolerated treatment well    Behavior During Therapy WFL for tasks assessed/performed                  Past Medical History:  Diagnosis Date   Arthritis    Complication of anesthesia    Gall stones    GERD (gastroesophageal reflux disease)    History of cardiac catheterization 2005   Minor elevation in cardiac enzymes however no significant obstructive CAD   Hyperlipidemia    Hypertension    Insomnia    PONV (postoperative nausea and vomiting)    Prediabetes    Past Surgical History:  Procedure Laterality Date   A-FLUTTER ABLATION N/A 08/01/2017   Procedure: A-FLUTTER ABLATION;  Surgeon: Hillis Range, MD;  Location: [facility];  Service: Cardiovascular;  Laterality: N/A;   ABDOMINAL HYSTERECTOMY     BACK SURGERY  1992   CARDIAC CATHETERIZATION     BACK IN 2004  SHE THINKS IT CAME BACK 'NORMAL'   CHOLECYSTECTOMY N/A 06/24/2015   Procedure: LAPAROSCOPIC CHOLECYSTECTOMY;  Surgeon: Rodman Pickle, MD;  Location: [facility] OR;  Service: General;  Laterality: N/A;   COLONOSCOPY N/A 08/30/2012   Procedure: COLONOSCOPY;  Surgeon: Malissa Hippo, MD;  Location: [facility];  Service: Endoscopy;  Laterality: N/A;  830   DILATION AND CURETTAGE OF UTERUS     ESOPHAGOGASTRODUODENOSCOPY  04/15/2011   Procedure: ESOPHAGOGASTRODUODENOSCOPY (EGD);  Surgeon: Malissa Hippo, MD;  Location: [facility];  Service: Endoscopy;  Laterality: N/A;   11:30   ESOPHAGOGASTRODUODENOSCOPY N/A 05/27/2016   Procedure: ESOPHAGOGASTRODUODENOSCOPY (EGD);  Surgeon: Malissa Hippo, MD;  Location: [facility];  Service: Endoscopy;  Laterality: N/A;  11:15   EYE SURGERY     cataract removal   NECK SURGERY  12/13/2018   REVERSE SHOULDER ARTHROPLASTY Left 04/03/2019   Procedure: REVERSE SHOULDER ARTHROPLASTY;  Surgeon: Bjorn Pippin, MD;  Location: [facility];  Service: Orthopedics;  Laterality: Left;   VAGINAL HYSTERECTOMY     Patient Active Problem List   Diagnosis Date Noted   Chronic right-sided low back pain with right-sided sciatica 04/12/2023   History of atrial flutter 04/20/2022   Paroxysmal SVT (supraventricular tachycardia) (HCC) 04/20/2022   Open wound of left lower leg 03/28/2022   Reactive arthritis (HCC) 12/28/2020   Arthritis 11/20/2020   Laryngopharyngeal reflux (LPR) 09/28/2020   Primary osteoarthritis of right hip 01/17/2020   Pedal edema 11/19/2019   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   Insomnia 03/15/2019   Hyperlipidemia 06/18/2014   Gout 06/18/2014   Osteopenia 10/28/2013   Hypertension 03/29/2011    PCP: Babs Sciara, MD  REFERRING PROVIDER:   Teryl Lucy, MD  Next apt 08/16/23  REFERRING DIAG: R POST THR on 07/06/2023  THERAPY DIAG:  S/P total right hip arthroplasty  Impaired functional  mobility, balance, gait, and endurance  Rationale for Evaluation and Treatment: Rehabilitation  ONSET DATE: 07/06/2023  SUBJECTIVE:   SUBJECTIVE STATEMENT: Pt arrived with a smile, no pain today, did experience some pain during the night.   Eval:  Lateral approach total hip arthroplasty on 07/06/2023. Pt voices concerns with husband ability to assist with caring for THA due to potential dementia. Wife voices concerns with safety concerns for hip. Pt reports she feels safe on a physical level and does not feel she will be abused but is concerned for how he treats/assists with level of care.    PERTINENT HISTORY: Reverse TSA Osteopenia HTN PAIN:  Are you having pain? Yes: NPRS scale: 2/10 Pain location: Right hip Pain description: dull/achy Aggravating factors: fast movements Relieving factors: OTC medications, Iceman  PRECAUTIONS: Other: No ER/IR and straight leg.   RED FLAGS: None   WEIGHT BEARING RESTRICTIONS: No  FALLS:  Has patient fallen in last 6 months? No   PATIENT GOALS: "do more on my own and get comfortable doing"   OBJECTIVE:  Note: Objective measures were completed at Evaluation unless otherwise noted.  PATIENT SURVEYS:  LEFS 22/80 = 27.5% ABC Scale: 1070/1600= 66.9%  COGNITION: Overall cognitive status: Within functional limits for tasks assessed     SENSATION: WFL  POSTURE: increased thoracic kyphosis  LOWER EXTREMITY ROM:  Active ROM Right eval Left eval  Hip flexion    Hip extension    Hip abduction    Hip adduction    Hip internal rotation    Hip external rotation    Knee flexion    Knee extension    Ankle dorsiflexion    Ankle plantarflexion    Ankle inversion    Ankle eversion     (Blank rows = not tested)  LOWER EXTREMITY MMT:  MMT Right eval Left eval  Hip flexion    Hip extension    Hip abduction    Hip adduction    Hip internal rotation    Hip external rotation    Knee flexion    Knee extension    Ankle dorsiflexion    Ankle plantarflexion    Ankle inversion    Ankle eversion     (Blank rows = not tested)   FUNCTIONAL TESTS:  30 seconds chair stand test:6x w/ 2 HHA on RW  2 minute walk test: 181ft  GAIT: Distance walked: 13ft Assistive device utilized: Environmental consultant - 2 wheeled Level of assistance: Modified independence Comments: mild right R circumduction during swing phase, limited knee flexion in R lower extremity                                                                                                                                TREATMENT DATE:  08/03/2023  -Standing hip abduction  isometric w/ ball against wall 10 x 10'' bilaterally -4in forward stepups with cues for anterior translation 2x10 -4in lateral stepups 2x10 -Powerup to stand to  tiptoe standing with sphere tidal tank 2 x 5 -Tandem stance 2 x 1' bilaterally -Squatting and reaching for cones on 18in box with feet planted on black strip pads x15 with black TB around knees for increased gluteal activation -Bodycraft walkouts 5x laterally leading with R side and 2x backwards   07/31/23 Gait with no AD x 200 ft Standing:  Isometric abduction against wall 10x 3" holds Minisquats front of chair 10x Sidestep with GTB around thigh 3RT with knees straight 3RT in minisquat position inside // bars SLS Lt 9", Rt 1-2" max of 3 Vector stance Bil with light HHA 5x 5" Supine:  Bridge with GTB around thigh 20x 5" (cueing to engage glut med prior raise) Sidelying:  clam no resistance with Rt LE, GTB with Lt 20x 5"  07/28/2023  -sidelying clamshells 10x3'' -Supine bridge with GTB @ knee 2x10 w/ 3'' -Heel raise on decline x 20 with BUE support @ // bars -3 Way Hip bilaterally @ // bars 2 x 5 -Bosu lunges in // bars x 10 bilaterally -Side stepping in // bars for 5' without UE support, cues for symmetrical side stepping and reducing sliding compensation   PATIENT EDUCATION:  Education details: PT Evaluation, findings, prognosis, frequency, attendance policy, and HEP if given.  Person educated: Patient Education method: Medical illustrator Education comprehension: verbalized understanding  HOME EXERCISE PROGRAM: Access Code: [email or web address] URL: [email or web address] Date: 07/10/2023 Prepared by: Starling Manns  Exercises - Seated Ankle Pumps  - 1 x daily - 7 x weekly - 2-3 sets - 20 reps - Seated Long Arc Quad  - 1 x daily - 7 x weekly - 2-3 sets - 20 reps - Supine Bridge  - 1 x daily - 7 x weekly - 2-3 sets - 10-12 reps - Supine Heel Slide  - 1 x daily - 7 x weekly - 2-3 sets - 20 reps -  Supine Quad Set  - 1 x daily - 7 x weekly - 2-3 sets - 20 reps  Access Code: [email or web address] URL: [email or web address] Date: 07/18/2023 Prepared by: Becky Sax  Exercises - Supine Hip Adduction Isometric with Ball  - 2 x daily - 7 x weekly - 2 sets - 10 reps - 5" hold - Hooklying Isometric Hip Abduction with Belt  - 2 x daily - 7 x weekly - 2 sets - 10 reps - 5" hold Access Code: [email or web address] URL: [email or web address] Date: 07/25/2023 Prepared by: Starling Manns  Exercises - Clamshell  - 1 x daily - 7 x weekly - 3 sets - 10 reps - Supine Bridge with Resistance Band  - 1 x daily - 7 x weekly - 3 sets - 10 reps Access Code: [email or web address] URL: [email or web address] Date: 08/03/2023 Prepared by: Starling Manns  Exercises - Standing Tandem Balance with Counter Support  - 1 x daily - 7 x weekly - 3 sets - 10 reps - Single Leg Stance with Support  - 1 x daily - 7 x weekly - 3 sets - 10 reps - Standing Isometric Hip Abduction with Knee at 90 at Wall  - 1 x daily - 7 x weekly - 3 sets - 10 reps - 10 hold  ASSESSMENT:  CLINICAL IMPRESSION: Pt tolerating session with progress strengthening and balancing activities. Pt showing improved gait mechanics with less antalgia. Pt continues with poor gluteus medius activation but tolerating standing interventions better than isolated hip abduction. Pt does continue to demonstrate weak gluteal mm, noted with increased difficulty with clam exercise without resistance.  OBJECTIVE IMPAIRMENTS: Abnormal gait, decreased activity tolerance, decreased balance, decreased endurance, decreased mobility, difficulty walking, decreased ROM, decreased strength, postural dysfunction, and pain.   ACTIVITY LIMITATIONS: carrying, lifting, bending, sitting, standing, squatting, sleeping, stairs, transfers, bed mobility, toileting, dressing, and locomotion level  PARTICIPATION LIMITATIONS: shopping, community activity, occupation,  and yard work  PERSONAL FACTORS: Age, Past/current experiences, and 3+ comorbidities: see above for details  are also affecting patient's functional outcome.   REHAB POTENTIAL: Good  CLINICAL DECISION MAKING: Stable/uncomplicated  EVALUATION COMPLEXITY: Low   GOALS: Goals reviewed with patient? No  SHORT TERM GOALS: Target date: 08/07/2023  Pt will be independent with HEP in order to demonstrate participation in Physical Therapy POC.  Baseline: Goal status: INITIAL  2.  Pt will 2/10 pain during mobility in order to demonstrate improved pain with functional activities.  Baseline:  Goal status: INITIAL  LONG TERM GOALS: Target date: 09/04/2023  Pt will improve 30 Second Chair Stand Test by >4 reps  in order to demonstrate improved functional strength to return to desired activities.  Baseline: See objective.  Goal status: INITIAL  2.  Pt will improve 2 MWT by 246ft in order to demonstrate improved functional ambulatory capacity in community setting.  Baseline: See objective.  Goal status: INITIAL  3.  Pt will improve LEFS score by 20 points in order to demonstrate improved outcomes with functional tasks and ADLs. Baseline: See objective.  Goal status: INITIAL  4.  Pt will improve ABC scale by 20 points in order to demonstrated improved safety and balance during functional activities.. Baseline: See objective.  Goal status: INITIAL    PLAN:  PT FREQUENCY: 2x/week  PT DURATION: 6 weeks  PLANNED INTERVENTIONS: 97164- PT Re-evaluation, 97110-Therapeutic exercises, 97530- Therapeutic activity, 97112- Neuromuscular re-education, 97535- Self Care, 40981- Manual therapy, [id number]- Gait training, 97014- Electrical stimulation (unattended), [id number]- Electrical stimulation (manual), [id number]- Traction (mechanical), Balance training, Stair training, Cryotherapy, and Moist heat  PLAN FOR NEXT SESSION: ROM, early BLE strengthening, no active ER/IR of R hip currently, no hip flexion past 90,  practice squatting and reaching activities.  Nelida Meuse PT, DPT [facility] [phone] office   Nelida Meuse, PT 08/03/2023, 10:16 AM

## 2023-08-07 ENCOUNTER — Ambulatory Visit (HOSPITAL_COMMUNITY): Payer: BLUE CROSS/BLUE SHIELD | Attending: Orthopedic Surgery

## 2023-08-07 ENCOUNTER — Encounter (HOSPITAL_COMMUNITY): Payer: Self-pay

## 2023-08-07 DIAGNOSIS — Z7409 Other reduced mobility: Secondary | ICD-10-CM | POA: Insufficient documentation

## 2023-08-07 DIAGNOSIS — Z96641 Presence of right artificial hip joint: Secondary | ICD-10-CM | POA: Insufficient documentation

## 2023-08-07 DIAGNOSIS — M6281 Muscle weakness (generalized): Secondary | ICD-10-CM | POA: Diagnosis not present

## 2023-08-07 NOTE — Therapy (Signed)
 OUTPATIENT PHYSICAL THERAPY LOWER EXTREMITY TREATMENT   Patient Name: Terri Douglas MRN: [id number] DOB:[birth year], 78 y.o., female Today's Date: 08/07/2023  END OF SESSION:  PT End of Session - 08/07/23 0930     Visit Number 7    Number of Visits 12    Date for PT Re-Evaluation 09/04/23    Authorization Type Medicare A & B; BCBS supplement    Authorization Time Period no auth no limit    Progress Note Due on Visit 10    PT Start Time 0931    PT Stop Time 1014    PT Time Calculation (min) 43 min    Activity Tolerance Patient tolerated treatment well    Behavior During Therapy WFL for tasks assessed/performed                  Past Medical History:  Diagnosis Date   Arthritis    Complication of anesthesia    Gall stones    GERD (gastroesophageal reflux disease)    History of cardiac catheterization 2005   Minor elevation in cardiac enzymes however no significant obstructive CAD   Hyperlipidemia    Hypertension    Insomnia    PONV (postoperative nausea and vomiting)    Prediabetes    Past Surgical History:  Procedure Laterality Date   A-FLUTTER ABLATION N/A 08/01/2017   Procedure: A-FLUTTER ABLATION;  Surgeon: Hillis Range, MD;  Location: [facility];  Service: Cardiovascular;  Laterality: N/A;   ABDOMINAL HYSTERECTOMY     BACK SURGERY  1992   CARDIAC CATHETERIZATION     BACK IN 2004  SHE THINKS IT CAME BACK 'NORMAL'   CHOLECYSTECTOMY N/A 06/24/2015   Procedure: LAPAROSCOPIC CHOLECYSTECTOMY;  Surgeon: Rodman Pickle, MD;  Location: [facility] OR;  Service: General;  Laterality: N/A;   COLONOSCOPY N/A 08/30/2012   Procedure: COLONOSCOPY;  Surgeon: Malissa Hippo, MD;  [facility]: [facility];  Service: Endoscopy;  Laterality: N/A;  830   DILATION AND CURETTAGE OF UTERUS     ESOPHAGOGASTRODUODENOSCOPY  04/15/2011   Procedure: ESOPHAGOGASTRODUODENOSCOPY (EGD);  Surgeon: Malissa Hippo, MD;  [facility]: [facility];  Service: Endoscopy;  Laterality: N/A;   11:30   ESOPHAGOGASTRODUODENOSCOPY N/A 05/27/2016   Procedure: ESOPHAGOGASTRODUODENOSCOPY (EGD);  Surgeon: Malissa Hippo, MD;  [facility]: [facility];  Service: Endoscopy;  Laterality: N/A;  11:15   EYE SURGERY     cataract removal   NECK SURGERY  12/13/2018   REVERSE SHOULDER ARTHROPLASTY Left 04/03/2019   Procedure: REVERSE SHOULDER ARTHROPLASTY;  Surgeon: Bjorn Pippin, MD;  Location: [facility];  Service: Orthopedics;  Laterality: Left;   VAGINAL HYSTERECTOMY     Patient Active Problem List   Diagnosis Date Noted   Chronic right-sided low back pain with right-sided sciatica 04/12/2023   History of atrial flutter 04/20/2022   Paroxysmal SVT (supraventricular tachycardia) (HCC) 04/20/2022   Open wound of left lower leg 03/28/2022   Reactive arthritis (HCC) 12/28/2020   Arthritis 11/20/2020   Laryngopharyngeal reflux (LPR) 09/28/2020   Primary osteoarthritis of right hip 01/17/2020   Pedal edema 11/19/2019   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   Insomnia 03/15/2019   Hyperlipidemia 06/18/2014   Gout 06/18/2014   Osteopenia 10/28/2013   Hypertension 03/29/2011    PCP: Babs Sciara, MD  REFERRING PROVIDER:   Teryl Lucy, MD  Next apt 08/16/23  REFERRING DIAG: R POST THR on 07/06/2023  THERAPY DIAG:  S/P total right hip arthroplasty  Impaired functional  mobility, balance, gait, and endurance  Muscle weakness (generalized)  Rationale for Evaluation and Treatment: Rehabilitation  ONSET DATE: 07/06/2023  SUBJECTIVE:   SUBJECTIVE STATEMENT: Pt states she is still having issues sleeping due to transition changes. Pain reported 2-3/10. Pt states that morning seems to be more painful and gets better throughout the day.  Eval:  Lateral approach total hip arthroplasty on 07/06/2023. Pt voices concerns with husband ability to assist with caring for THA due to potential dementia. Wife voices concerns with safety concerns for hip. Pt reports she feels  safe on a physical level and does not feel she will be abused but is concerned for how he treats/assists with level of care.   PERTINENT HISTORY: Reverse TSA Osteopenia HTN PAIN:  Are you having pain? Yes: NPRS scale: 2-3/10 Pain location: Right hip Pain description: dull/achy Aggravating factors: fast movements Relieving factors: OTC medications, Iceman  PRECAUTIONS: Other: No ER/IR and straight leg.   RED FLAGS: None   WEIGHT BEARING RESTRICTIONS: No  FALLS:  Has patient fallen in last 6 months? No   PATIENT GOALS: "do more on my own and get comfortable doing"   OBJECTIVE:  Note: Objective measures were completed at Evaluation unless otherwise noted.  PATIENT SURVEYS:  LEFS 22/80 = 27.5% ABC Scale: 1070/1600= 66.9%  COGNITION: Overall cognitive status: Within functional limits for tasks assessed     SENSATION: WFL  POSTURE: increased thoracic kyphosis  LOWER EXTREMITY ROM:  Active ROM Right eval Left eval  Hip flexion    Hip extension    Hip abduction    Hip adduction    Hip internal rotation    Hip external rotation    Knee flexion    Knee extension    Ankle dorsiflexion    Ankle plantarflexion    Ankle inversion    Ankle eversion     (Blank rows = not tested)  LOWER EXTREMITY MMT:  MMT Right eval Left eval  Hip flexion    Hip extension    Hip abduction    Hip adduction    Hip internal rotation    Hip external rotation    Knee flexion    Knee extension    Ankle dorsiflexion    Ankle plantarflexion    Ankle inversion    Ankle eversion     (Blank rows = not tested)   FUNCTIONAL TESTS:  30 seconds chair stand test:6x w/ 2 HHA on RW  2 minute walk test: 153ft  GAIT: Distance walked: 124ft Assistive device utilized: Environmental consultant - 2 wheeled Level of assistance: Modified independence Comments: mild right R circumduction during swing phase, limited knee flexion in R lower extremity                                                                                                                                 TREATMENT DATE:  08/07/2023   Therapeutic Exercise: -Nustep, 6 minutes with level 1 resistance and  cued for at least 60 spm -Supine Bridges with GTB, 2 sets of 10 reps -Standing 3 way hip, bilaterally, 2 sets of 10 reps (2 set with GTB around knees -6in lateral stepup and overs 2x10, pt demonstrates decreased ability to WB on RLE compared to the LLE  Therapeutic Activity: -6in forward stepups with cues for hip extension 2x10 -STS to arm chair with 5lb KB, 1 set of 10 reps -STS from arm chair, targeted RLE staggered stance, 1 set of 10 reps   08/03/2023  -Standing hip abduction isometric w/ ball against wall 10 x 10'' bilaterally -4in forward stepups with cues for anterior translation 2x10 -4in lateral stepups 2x10 -Powerup to stand to tiptoe standing with sphere tidal tank 2 x 5 -Tandem stance 2 x 1' bilaterally -Squatting and reaching for cones on 18in box with feet planted on black strip pads x15 with black TB around knees for increased gluteal activation -Bodycraft walkouts 5x laterally leading with R side and 2x backwards   07/31/23 Gait with no AD x 200 ft Standing:  Isometric abduction against wall 10x 3" holds Minisquats front of chair 10x Sidestep with GTB around thigh 3RT with knees straight 3RT in minisquat position inside // bars SLS Lt 9", Rt 1-2" max of 3 Vector stance Bil with light HHA 5x 5" Supine:  Bridge with GTB around thigh 20x 5" (cueing to engage glut med prior raise) Sidelying:  clam no resistance with Rt LE, GTB with Lt 20x 5"    PATIENT EDUCATION:  Education details: PT Evaluation, findings, prognosis, frequency, attendance policy, and HEP if given.  Person educated: Patient Education method: Medical illustrator Education comprehension: verbalized understanding  HOME EXERCISE PROGRAM: Access Code: [email or web address] URL: [email or web address] Date:  07/10/2023 Prepared by: Starling Manns  Exercises - Seated Ankle Pumps  - 1 x daily - 7 x weekly - 2-3 sets - 20 reps - Seated Long Arc Quad  - 1 x daily - 7 x weekly - 2-3 sets - 20 reps - Supine Bridge  - 1 x daily - 7 x weekly - 2-3 sets - 10-12 reps - Supine Heel Slide  - 1 x daily - 7 x weekly - 2-3 sets - 20 reps - Supine Quad Set  - 1 x daily - 7 x weekly - 2-3 sets - 20 reps  Access Code: [email or web address] URL: [email or web address] Date: 07/18/2023 Prepared by: Becky Sax  Exercises - Supine Hip Adduction Isometric with Ball  - 2 x daily - 7 x weekly - 2 sets - 10 reps - 5" hold - Hooklying Isometric Hip Abduction with Belt  - 2 x daily - 7 x weekly - 2 sets - 10 reps - 5" hold Access Code: [email or web address] URL: [email or web address] Date: 07/25/2023 Prepared by: Starling Manns  Exercises - Clamshell  - 1 x daily - 7 x weekly - 3 sets - 10 reps - Supine Bridge with Resistance Band  - 1 x daily - 7 x weekly - 3 sets - 10 reps Access Code: [email or web address] URL: [email or web address] Date: 08/03/2023 Prepared by: Starling Manns  Exercises - Standing Tandem Balance with Counter Support  - 1 x daily - 7 x weekly - 3 sets - 10 reps - Single Leg Stance with Support  - 1 x daily - 7 x weekly - 3 sets - 10 reps - Standing Isometric Hip Abduction with Knee at 90 at Wall  - 1 x daily - 7 x weekly - 3 sets - 10 reps - 10 hold  ASSESSMENT:  CLINICAL IMPRESSION: Pt demonstrates increased tolerance to dynamic strength with forward and lateral step up activities. Pt also presents this morning without any AD. Pt continues to demonstrate difficulty with bed mobility transitions and transfers, only rolling onto left side when arising from bed. Pt demonstrates improved gait pattern following Nustep exercise and following therapy session. Patient would continue to benefit from skilled physical therapy for increased pain free R hip ROM, improved quality of life, and  continued progress towards therapy goals.     OBJECTIVE IMPAIRMENTS: Abnormal gait, decreased activity tolerance, decreased balance, decreased endurance, decreased mobility, difficulty walking, decreased ROM, decreased strength, postural dysfunction, and pain.   ACTIVITY LIMITATIONS: carrying, lifting, bending, sitting, standing, squatting, sleeping, stairs, transfers, bed mobility, toileting, dressing, and locomotion level  PARTICIPATION LIMITATIONS: shopping, community activity, occupation, and yard work  PERSONAL FACTORS: Age, Past/current experiences, and 3+ comorbidities: see above for details  are also affecting patient's functional outcome.   REHAB POTENTIAL: Good  CLINICAL DECISION MAKING: Stable/uncomplicated  EVALUATION COMPLEXITY: Low   GOALS: Goals reviewed with patient? No  SHORT TERM GOALS: Target date: 08/07/2023  Pt will be independent with HEP in order to demonstrate participation in Physical Therapy POC.  Baseline: Goal status: INITIAL  2.  Pt will 2/10 pain during mobility in order to demonstrate improved pain with functional activities.  Baseline:  Goal status: INITIAL  LONG TERM GOALS: Target date: 09/04/2023  Pt will improve 30 Second Chair Stand Test by >4 reps  in order to demonstrate improved functional strength to return to desired activities.  Baseline: See objective.  Goal status: INITIAL  2.  Pt will improve 2 MWT by 269ft in order to demonstrate improved functional ambulatory capacity in community setting.  Baseline: See objective.  Goal status: INITIAL  3.  Pt will improve LEFS score by 20 points in order to demonstrate improved outcomes with functional tasks and ADLs. Baseline: See objective.  Goal status: INITIAL  4.  Pt will improve ABC scale by 20 points in order to demonstrated improved safety and balance during functional activities.. Baseline: See objective.  Goal status: INITIAL    PLAN:  PT FREQUENCY: 2x/week  PT DURATION:  6 weeks  PLANNED INTERVENTIONS: 97164- PT Re-evaluation, 97110-Therapeutic exercises, 97530- Therapeutic activity, 97112- Neuromuscular re-education, 97535- Self Care, 16109- Manual therapy, [id number]- Gait training, 97014- Electrical stimulation (unattended), [id number]- Electrical stimulation (manual), [id number]- Traction (mechanical), Balance training, Stair training, Cryotherapy, and Moist heat  PLAN FOR NEXT SESSION: ROM, early BLE strengthening, no active ER/IR of R hip currently, no hip flexion past 90, progress squatting and reaching activities.  Luz Lex, PT, DPT [facility] Office: [phone]

## 2023-08-08 ENCOUNTER — Encounter: Payer: Self-pay | Admitting: Family Medicine

## 2023-08-08 ENCOUNTER — Ambulatory Visit (INDEPENDENT_AMBULATORY_CARE_PROVIDER_SITE_OTHER): Admitting: Family Medicine

## 2023-08-08 VITALS — BP 128/77 | HR 62 | Temp 98.5°F | Ht 62.0 in | Wt 141.0 lb

## 2023-08-08 DIAGNOSIS — R233 Spontaneous ecchymoses: Secondary | ICD-10-CM | POA: Diagnosis not present

## 2023-08-08 MED ORDER — TRIAMCINOLONE ACETONIDE 0.5 % EX OINT: 1.0000 | TOPICAL_OINTMENT | Freq: Two times a day (BID) | CUTANEOUS | 0 refills | Status: DC

## 2023-08-08 NOTE — Assessment & Plan Note (Signed)
 CBC and urinalysis today for further evaluation.  Stopping aspirin.  Triamcinolone as needed.

## 2023-08-08 NOTE — Patient Instructions (Signed)
 Stop aspirin.  Medication as directed.  Labs today.

## 2023-08-08 NOTE — Progress Notes (Signed)
 Subjective:  Patient ID: Terri Douglas, female    DOB: 08/31/1945  Age: 78 y.o. MRN: [id number]  CC:   Chief Complaint  Patient presents with   Rash    On bilateral legs, arms and chest - itches  Started last week in legs , then arms and spread to chest today- R hip surgery 4 weeks ago     HPI:  78 year old female presents for evaluation of rash.  Patient states that she has been on 2 full-strength aspirin as directed by orthopedics for the past month.  She states that she is more than 1 month from her surgery which was at the end of January.  Patient developed a rash last week.  Located predominantly on the lower extremities.  She also has an area to the left side of the chest that is raised and itches.  No new changes or exposures other than the aspirin and a few doses of pain medication in the postoperative period.  Patient Active Problem List   Diagnosis Date Noted   Petechiae 08/08/2023   Chronic right-sided low back pain with right-sided sciatica 04/12/2023   History of atrial flutter 04/20/2022   Paroxysmal SVT (supraventricular tachycardia) (HCC) 04/20/2022   Laryngopharyngeal reflux (LPR) 09/28/2020   Primary osteoarthritis of right hip 01/17/2020   Pedal edema 11/19/2019   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   Insomnia 03/15/2019   Hyperlipidemia 06/18/2014   Gout 06/18/2014   Osteopenia 10/28/2013   Hypertension 03/29/2011    Social Hx   Social History   Socioeconomic History   Marital status: Married    Spouse name: Not on file   Number of children: Not on file   Years of education: Not on file   Highest education level: Not on file  Occupational History   Not on file  Tobacco Use   Smoking status: Never   Smokeless tobacco: Never  Vaping Use   Vaping status: Never Used  Substance and Sexual Activity   Alcohol use: No   Drug use: No   Sexual activity: Never  Other Topics Concern   Not on file  Social History Narrative   Not  on file   Social Drivers of Health   Financial Resource Strain: Patient Declined (08/08/2023)   Overall Financial Resource Strain (CARDIA)    Difficulty of Paying Living Expenses: Patient declined  Food Insecurity: Patient Declined (08/08/2023)   Hunger Vital Sign    Worried About Running Out of Food in the Last Year: Patient declined    Ran Out of Food in the Last Year: Patient declined  Transportation Needs: Patient Declined (08/08/2023)   PRAPARE - Administrator, Civil Service (Medical): Patient declined    Lack of Transportation (Non-Medical): Patient declined  Physical Activity: Unknown (08/08/2023)   Exercise Vital Sign    Days of Exercise per Week: Patient declined    Minutes of Exercise per Session: Not on file  Stress: Patient Declined (08/08/2023)   [employer] of Occupational Health - Occupational Stress Questionnaire    Feeling of Stress : Patient declined  Social Connections: Unknown (08/08/2023)   Social Connection and Isolation Panel [NHANES]    Frequency of Communication with Friends and Family: Patient declined    Frequency of Social Gatherings with Friends and Family: Patient declined    Attends Religious Services: More than 4 times per year    Active Member of [employer] or Organizations: Patient declined    Attends Banker Meetings:  Not on file    Marital Status: Married    Review of Systems Per HPI  Objective:  BP 128/77   Pulse 62   Temp 98.5 F (36.9 C)   Ht 5\' 2"  (1.575 m)   Wt 141 lb (64 kg)   SpO2 98%   BMI 25.79 kg/m      08/08/2023    3:03 PM 06/05/2023    8:55 AM 06/05/2023    8:29 AM  BP/Weight  Systolic BP 128 128 144  Diastolic BP 77 72 82  Wt. (Lbs) 141  135.4  BMI 25.79 kg/m2  24.76 kg/m2    Physical Exam Constitutional:      General: She is not in acute distress.    Appearance: Normal appearance.  HENT:     Head: Normocephalic and atraumatic.  Pulmonary:     Effort: Pulmonary effort is normal. No  respiratory distress.  Skin:    Comments: Petechia noted of the lower extremities.  Few scattered papules to the left side of the chest which are mildly erythematous.  Neurological:     Mental Status: She is alert.  Psychiatric:        Mood and Affect: Mood normal.        Behavior: Behavior normal.     Lab Results  Component Value Date   WBC 5.0 07/25/2022   HGB 12.8 07/25/2022   HCT 40.7 07/25/2022   PLT 310 07/25/2022   GLUCOSE 103 (H) 01/13/2023   CHOL 127 01/13/2023   TRIG 153 (H) 01/13/2023   HDL 56 01/13/2023   LDLCALC 45 01/13/2023   ALT 15 03/23/2022   AST 21 03/23/2022   NA 141 01/13/2023   K 4.1 01/13/2023   CL 100 01/13/2023   CREATININE 1.18 (H) 01/13/2023   BUN 14 01/13/2023   CO2 26 01/13/2023   TSH 3.440 06/26/2017   INR 1.0 07/25/2022   HGBA1C 5.9 (H) 05/21/2020     Assessment & Plan:  Petechiae Assessment & Plan: CBC and urinalysis today for further evaluation.  Stopping aspirin.  Triamcinolone as needed.  Orders: -     CBC -     Urinalysis, Routine w reflex microscopic  Other orders -     Triamcinolone Acetonide; Apply 1 Application topically 2 (two) times daily.  Dispense: 30 g; Refill: 0    Follow-up:  Return if symptoms worsen or fail to improve.  Terri Douglas Other DO [facility]

## 2023-08-09 ENCOUNTER — Encounter: Payer: Self-pay | Admitting: Family Medicine

## 2023-08-09 LAB — URINALYSIS, ROUTINE W REFLEX MICROSCOPIC
Bilirubin, UA: NEGATIVE
Glucose, UA: NEGATIVE
Leukocytes,UA: NEGATIVE
Nitrite, UA: NEGATIVE
Protein,UA: NEGATIVE
RBC, UA: NEGATIVE
Specific Gravity, UA: >=1.03 — AB (ref 1.005–1.030)
Urobilinogen, Ur: 1 mg/dL (ref 0.2–1.0)
pH, UA: 5 (ref 5.0–7.5)

## 2023-08-09 LAB — CBC
Hematocrit: 41.1 % (ref 34.0–46.6)
Hemoglobin: 12.9 g/dL (ref 11.1–15.9)
MCH: 29.6 pg (ref 26.6–33.0)
MCHC: 31.4 g/dL — ABNORMAL LOW (ref 31.5–35.7)
MCV: 94 fL (ref 79–97)
Platelets: 343 10*3/uL (ref 150–450)
RBC: 4.36 x10E6/uL (ref 3.77–5.28)
RDW: 12.7 % (ref 11.7–15.4)
WBC: 6 10*3/uL (ref 3.4–10.8)

## 2023-08-10 ENCOUNTER — Ambulatory Visit (HOSPITAL_COMMUNITY): Payer: BLUE CROSS/BLUE SHIELD

## 2023-08-10 ENCOUNTER — Encounter (HOSPITAL_COMMUNITY): Payer: Self-pay

## 2023-08-10 DIAGNOSIS — Z96641 Presence of right artificial hip joint: Secondary | ICD-10-CM

## 2023-08-10 DIAGNOSIS — Z7409 Other reduced mobility: Secondary | ICD-10-CM

## 2023-08-10 DIAGNOSIS — M6281 Muscle weakness (generalized): Secondary | ICD-10-CM

## 2023-08-10 NOTE — Therapy (Signed)
 OUTPATIENT PHYSICAL THERAPY LOWER EXTREMITY TREATMENT   Patient Name: Terri Douglas MRN: [id number] DOB:[birth year], 78 y.o., female Today's Date: 08/10/2023  END OF SESSION:  PT End of Session - 08/10/23 0924     Visit Number 8    Number of Visits 12    Date for PT Re-Evaluation 09/04/23    Authorization Type Medicare A & B; BCBS supplement    Authorization Time Period no auth no limit    Progress Note Due on Visit 10    PT Start Time 0926    PT Stop Time 1012    PT Time Calculation (min) 46 min    Activity Tolerance Patient tolerated treatment well    Behavior During Therapy WFL for tasks assessed/performed                  Past Medical History:  Diagnosis Date   Arthritis    Complication of anesthesia    Gall stones    GERD (gastroesophageal reflux disease)    History of cardiac catheterization 2005   Minor elevation in cardiac enzymes however no significant obstructive CAD   Hyperlipidemia    Hypertension    Insomnia    PONV (postoperative nausea and vomiting)    Prediabetes    Past Surgical History:  Procedure Laterality Date   A-FLUTTER ABLATION N/A 08/01/2017   Procedure: A-FLUTTER ABLATION;  Surgeon: Hillis Range, MD;  Location: [facility];  Service: Cardiovascular;  Laterality: N/A;   ABDOMINAL HYSTERECTOMY     BACK SURGERY  1992   CARDIAC CATHETERIZATION     BACK IN 2004  SHE THINKS IT CAME BACK 'NORMAL'   CHOLECYSTECTOMY N/A 06/24/2015   Procedure: LAPAROSCOPIC CHOLECYSTECTOMY;  Surgeon: Rodman Pickle, MD;  Location: [facility] OR;  Service: General;  Laterality: N/A;   COLONOSCOPY N/A 08/30/2012   Procedure: COLONOSCOPY;  Surgeon: Malissa Hippo, MD;  Location: [facility];  Service: Endoscopy;  Laterality: N/A;  830   DILATION AND CURETTAGE OF UTERUS     ESOPHAGOGASTRODUODENOSCOPY  04/15/2011   Procedure: ESOPHAGOGASTRODUODENOSCOPY (EGD);  Surgeon: Malissa Hippo, MD;  Location: [facility];  Service: Endoscopy;  Laterality: N/A;   11:30   ESOPHAGOGASTRODUODENOSCOPY N/A 05/27/2016   Procedure: ESOPHAGOGASTRODUODENOSCOPY (EGD);  Surgeon: Malissa Hippo, MD;  Location: [facility];  Service: Endoscopy;  Laterality: N/A;  11:15   EYE SURGERY     cataract removal   NECK SURGERY  12/13/2018   REVERSE SHOULDER ARTHROPLASTY Left 04/03/2019   Procedure: REVERSE SHOULDER ARTHROPLASTY;  Surgeon: Bjorn Pippin, MD;  Location: [facility] ORS;  Service: Orthopedics;  Laterality: Left;   VAGINAL HYSTERECTOMY     Patient Active Problem List   Diagnosis Date Noted   Petechiae 08/08/2023   Chronic right-sided low back pain with right-sided sciatica 04/12/2023   History of atrial flutter 04/20/2022   Paroxysmal SVT (supraventricular tachycardia) (HCC) 04/20/2022   Laryngopharyngeal reflux (LPR) 09/28/2020   Primary osteoarthritis of right hip 01/17/2020   Pedal edema 11/19/2019   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   Insomnia 03/15/2019   Hyperlipidemia 06/18/2014   Gout 06/18/2014   Osteopenia 10/28/2013   Hypertension 03/29/2011    PCP: Babs Sciara, MD  REFERRING PROVIDER:   Teryl Lucy, MD  Next apt 08/16/23  REFERRING DIAG: R POST THR on 07/06/2023  THERAPY DIAG:  S/P total right hip arthroplasty  Impaired functional mobility, balance, gait, and endurance  Muscle weakness (generalized)  Rationale for Evaluation and Treatment:  Rehabilitation  ONSET DATE: 07/06/2023  SUBJECTIVE:   SUBJECTIVE STATEMENT: Pain scale 2-3/10 Rt hip.  Continues to have difficulty sleeping.  Eval:  Lateral approach total hip arthroplasty on 07/06/2023. Pt voices concerns with husband ability to assist with caring for THA due to potential dementia. Wife voices concerns with safety concerns for hip. Pt reports she feels safe on a physical level and does not feel she will be abused but is concerned for how he treats/assists with level of care.   PERTINENT HISTORY: Reverse TSA Osteopenia HTN PAIN:  Are you  having pain? Yes: NPRS scale: 2-3/10 Pain location: Right hip Pain description: dull/achy Aggravating factors: fast movements Relieving factors: OTC medications, Iceman  PRECAUTIONS: Other: No ER/IR and straight leg.   RED FLAGS: None   WEIGHT BEARING RESTRICTIONS: No  FALLS:  Has patient fallen in last 6 months? No   PATIENT GOALS: "do more on my own and get comfortable doing"   OBJECTIVE:  Note: Objective measures were completed at Evaluation unless otherwise noted.  PATIENT SURVEYS:  LEFS 22/80 = 27.5% ABC Scale: 1070/1600= 66.9%  COGNITION: Overall cognitive status: Within functional limits for tasks assessed     SENSATION: WFL  POSTURE: increased thoracic kyphosis  LOWER EXTREMITY ROM:  Active ROM Right eval Left eval  Hip flexion    Hip extension    Hip abduction    Hip adduction    Hip internal rotation    Hip external rotation    Knee flexion    Knee extension    Ankle dorsiflexion    Ankle plantarflexion    Ankle inversion    Ankle eversion     (Blank rows = not tested)  LOWER EXTREMITY MMT:  MMT Right eval Left eval  Hip flexion    Hip extension    Hip abduction    Hip adduction    Hip internal rotation    Hip external rotation    Knee flexion    Knee extension    Ankle dorsiflexion    Ankle plantarflexion    Ankle inversion    Ankle eversion     (Blank rows = not tested)   FUNCTIONAL TESTS:  30 seconds chair stand test:6x w/ 2 HHA on RW  2 minute walk test: 146ft  GAIT: Distance walked: 157ft Assistive device utilized: Environmental consultant - 2 wheeled Level of assistance: Modified independence Comments: mild right R circumduction during swing phase, limited knee flexion in R lower extremity                                                                                                                                TREATMENT DATE:  08/10/23: Standing:  Isometric abduction into ball 10x 5" Sit to stand eccentric control with GTB  around thigh holding tidal tank eccentric control 10x 2 sets (cueing for equal weight bearing) Sidestep GTB around thigh 5RT Vector stance 5x 5"  Reciprocal pattern 7in 2RT 6in step  up 15x 3D hip excursion (rotation, lateral weight shift, squat, extension) 6in Lateral lunge with UE support 10x  08/07/2023   Therapeutic Exercise: -Nustep, 6 minutes with level 1 resistance and cued for at least 60 spm -Supine Bridges with GTB, 2 sets of 10 reps -Standing 3 way hip, bilaterally, 2 sets of 10 reps (2 set with GTB around knees -6in lateral stepup and overs 2x10, pt demonstrates decreased ability to WB on RLE compared to the LLE  Therapeutic Activity: -6in forward stepups with cues for hip extension 2x10 -STS to arm chair with 5lb KB, 1 set of 10 reps -STS from arm chair, targeted RLE staggered stance, 1 set of 10 reps   08/03/2023  -Standing hip abduction isometric w/ ball against wall 10 x 10'' bilaterally -4in forward stepups with cues for anterior translation 2x10 -4in lateral stepups 2x10 -Powerup to stand to tiptoe standing with sphere tidal tank 2 x 5 -Tandem stance 2 x 1' bilaterally -Squatting and reaching for cones on 18in box with feet planted on black strip pads x15 with black TB around knees for increased gluteal activation -Bodycraft walkouts 5x laterally leading with R side and 2x backwards   07/31/23 Gait with no AD x 200 ft Standing:  Isometric abduction against wall 10x 3" holds Minisquats front of chair 10x Sidestep with GTB around thigh 3RT with knees straight 3RT in minisquat position inside // bars SLS Lt 9", Rt 1-2" max of 3 Vector stance Bil with light HHA 5x 5" Supine:  Bridge with GTB around thigh 20x 5" (cueing to engage glut med prior raise) Sidelying:  clam no resistance with Rt LE, GTB with Lt 20x 5"    PATIENT EDUCATION:  Education details: PT Evaluation, findings, prognosis, frequency, attendance policy, and HEP if given.  Person educated:  Patient Education method: Medical illustrator Education comprehension: verbalized understanding  HOME EXERCISE PROGRAM: Access Code: [email or web address] URL: [email or web address] Date: 07/10/2023 Prepared by: Starling Manns  Exercises - Seated Ankle Pumps  - 1 x daily - 7 x weekly - 2-3 sets - 20 reps - Seated Long Arc Quad  - 1 x daily - 7 x weekly - 2-3 sets - 20 reps - Supine Bridge  - 1 x daily - 7 x weekly - 2-3 sets - 10-12 reps - Supine Heel Slide  - 1 x daily - 7 x weekly - 2-3 sets - 20 reps - Supine Quad Set  - 1 x daily - 7 x weekly - 2-3 sets - 20 reps  Access Code: [email or web address] URL: [email or web address] Date: 07/18/2023 Prepared by: Becky Sax  Exercises - Supine Hip Adduction Isometric with Ball  - 2 x daily - 7 x weekly - 2 sets - 10 reps - 5" hold - Hooklying Isometric Hip Abduction with Belt  - 2 x daily - 7 x weekly - 2 sets - 10 reps - 5" hold Access Code: [email or web address] URL: [email or web address] Date: 07/25/2023 Prepared by: Starling Manns  Exercises - Clamshell  - 1 x daily - 7 x weekly - 3 sets - 10 reps - Supine Bridge with Resistance Band  - 1 x daily - 7 x weekly - 3 sets - 10 reps Access Code: [email or web address] URL: [email or web address] Date: 08/03/2023 Prepared by: Starling Manns  Exercises - Standing Tandem Balance with Counter Support  - 1 x daily - 7 x weekly - 3 sets - 10 reps - Single Leg Stance with Support  - 1 x daily - 7 x weekly - 3 sets -  10 reps - Standing Isometric Hip Abduction with Knee at 90 at Wall  - 1 x daily - 7 x weekly - 3 sets - 10 reps - 10 hold  ASSESSMENT:  CLINICAL IMPRESSION: Pt at 6 weeks post-op.  Session focus on proximal strengthening, hip mobility and progressed functional strengthening.  Added glut specific strengthening with min cueing for form and mechanics.  Pt continues to demonstrate weakness iwht hip flexion and abduction noted with increased UE support  required with step up training and difficulty with balance activities.  NO reports of increased pain through session.       OBJECTIVE IMPAIRMENTS: Abnormal gait, decreased activity tolerance, decreased balance, decreased endurance, decreased mobility, difficulty walking, decreased ROM, decreased strength, postural dysfunction, and pain.   ACTIVITY LIMITATIONS: carrying, lifting, bending, sitting, standing, squatting, sleeping, stairs, transfers, bed mobility, toileting, dressing, and locomotion level  PARTICIPATION LIMITATIONS: shopping, community activity, occupation, and yard work  PERSONAL FACTORS: Age, Past/current experiences, and 3+ comorbidities: see above for details  are also affecting patient's functional outcome.   REHAB POTENTIAL: Good  CLINICAL DECISION MAKING: Stable/uncomplicated  EVALUATION COMPLEXITY: Low   GOALS: Goals reviewed with patient? No  SHORT TERM GOALS: Target date: 08/07/2023  Pt will be independent with HEP in order to demonstrate participation in Physical Therapy POC.  Baseline: Goal status: INITIAL  2.  Pt will 2/10 pain during mobility in order to demonstrate improved pain with functional activities.  Baseline:  Goal status: INITIAL  LONG TERM GOALS: Target date: 09/04/2023  Pt will improve 30 Second Chair Stand Test by >4 reps  in order to demonstrate improved functional strength to return to desired activities.  Baseline: See objective.  Goal status: INITIAL  2.  Pt will improve 2 MWT by 256ft in order to demonstrate improved functional ambulatory capacity in community setting.  Baseline: See objective.  Goal status: INITIAL  3.  Pt will improve LEFS score by 20 points in order to demonstrate improved outcomes with functional tasks and ADLs. Baseline: See objective.  Goal status: INITIAL  4.  Pt will improve ABC scale by 20 points in order to demonstrated improved safety and balance during functional activities.. Baseline: See objective.   Goal status: INITIAL    PLAN:  PT FREQUENCY: 2x/week  PT DURATION: 6 weeks  PLANNED INTERVENTIONS: 97164- PT Re-evaluation, 97110-Therapeutic exercises, 97530- Therapeutic activity, 97112- Neuromuscular re-education, 97535- Self Care, 29562- Manual therapy, [id number]- Gait training, 97014- Electrical stimulation (unattended), [id number]- Electrical stimulation (manual), [id number]- Traction (mechanical), Balance training, Stair training, Cryotherapy, and Moist heat  PLAN FOR NEXT SESSION: ROM, early BLE strengthening, no active ER/IR of R hip currently, no hip flexion past 90, progress squatting and reaching activities.   Becky Sax, LPTA/CLT; CBIS [phone]  Juel Burrow, PTA 08/10/2023, 3:30 PM

## 2023-08-14 ENCOUNTER — Ambulatory Visit (HOSPITAL_COMMUNITY): Payer: BLUE CROSS/BLUE SHIELD

## 2023-08-14 ENCOUNTER — Encounter (HOSPITAL_COMMUNITY): Payer: Self-pay

## 2023-08-14 DIAGNOSIS — Z7409 Other reduced mobility: Secondary | ICD-10-CM

## 2023-08-14 DIAGNOSIS — M6281 Muscle weakness (generalized): Secondary | ICD-10-CM | POA: Diagnosis not present

## 2023-08-14 DIAGNOSIS — Z96641 Presence of right artificial hip joint: Secondary | ICD-10-CM | POA: Diagnosis not present

## 2023-08-14 NOTE — Therapy (Addendum)
 OUTPATIENT PHYSICAL THERAPY LOWER EXTREMITY TREATMENT   Patient Name: Terri Douglas MRN: [id number] DOB:[birth year], 78 y.o., female Today's Date: 08/14/2023  END OF SESSION:  PT End of Session - 08/14/23 1015     Visit Number 9    Number of Visits 12    Date for PT Re-Evaluation 09/04/23    Authorization Type Medicare A & B; BCBS supplement    Authorization Time Period no auth no limit    Progress Note Due on Visit 10    PT Start Time [phone]    PT Stop Time 1015    PT Time Calculation (min) 37 min    Activity Tolerance Patient tolerated treatment well    Behavior During Therapy WFL for tasks assessed/performed             Past Medical History:  Diagnosis Date   Arthritis    Complication of anesthesia    Gall stones    GERD (gastroesophageal reflux disease)    History of cardiac catheterization 2005   Minor elevation in cardiac enzymes however no significant obstructive CAD   Hyperlipidemia    Hypertension    Insomnia    PONV (postoperative nausea and vomiting)    Prediabetes    Past Surgical History:  Procedure Laterality Date   A-FLUTTER ABLATION N/A 08/01/2017   Procedure: A-FLUTTER ABLATION;  Surgeon: Hillis Range, MD;  Location: [facility];  Service: Cardiovascular;  Laterality: N/A;   ABDOMINAL HYSTERECTOMY     BACK SURGERY  1992   CARDIAC CATHETERIZATION     BACK IN 2004  SHE THINKS IT CAME BACK 'NORMAL'   CHOLECYSTECTOMY N/A 06/24/2015   Procedure: LAPAROSCOPIC CHOLECYSTECTOMY;  Surgeon: Rodman Pickle, MD;  Location: [facility];  Service: General;  Laterality: N/A;   COLONOSCOPY N/A 08/30/2012   Procedure: COLONOSCOPY;  Surgeon: Malissa Hippo, MD;  Location: [facility];  Service: Endoscopy;  Laterality: N/A;  830   DILATION AND CURETTAGE OF UTERUS     ESOPHAGOGASTRODUODENOSCOPY  04/15/2011   Procedure: ESOPHAGOGASTRODUODENOSCOPY (EGD);  Surgeon: Malissa Hippo, MD;  Location: [facility];  Service: Endoscopy;  Laterality: N/A;  11:30    ESOPHAGOGASTRODUODENOSCOPY N/A 05/27/2016   Procedure: ESOPHAGOGASTRODUODENOSCOPY (EGD);  Surgeon: Malissa Hippo, MD;  Location: [facility];  Service: Endoscopy;  Laterality: N/A;  11:15   EYE SURGERY     cataract removal   NECK SURGERY  12/13/2018   REVERSE SHOULDER ARTHROPLASTY Left 04/03/2019   Procedure: REVERSE SHOULDER ARTHROPLASTY;  Surgeon: Bjorn Pippin, MD;  Location: [facility];  Service: Orthopedics;  Laterality: Left;   VAGINAL HYSTERECTOMY     Patient Active Problem List   Diagnosis Date Noted   Petechiae 08/08/2023   Chronic right-sided low back pain with right-sided sciatica 04/12/2023   History of atrial flutter 04/20/2022   Paroxysmal SVT (supraventricular tachycardia) (HCC) 04/20/2022   Laryngopharyngeal reflux (LPR) 09/28/2020   Primary osteoarthritis of right hip 01/17/2020   Pedal edema 11/19/2019   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   Insomnia 03/15/2019   Hyperlipidemia 06/18/2014   Gout 06/18/2014   Osteopenia 10/28/2013   Hypertension 03/29/2011    PCP: Babs Sciara, MD  REFERRING PROVIDER:   Teryl Lucy, MD  Next apt 08/16/23  REFERRING DIAG: R POST THR on 07/06/2023  THERAPY DIAG:  S/P total right hip arthroplasty  Impaired functional mobility, balance, gait, and endurance  Muscle weakness (generalized)  Rationale for Evaluation and Treatment: Rehabilitation  ONSET DATE: 07/06/2023  SUBJECTIVE:   SUBJECTIVE STATEMENT: Pt continues to report 2-3/10 pain. Reports .   Eval:  Lateral approach total hip arthroplasty on 07/06/2023. Pt voices concerns with husband ability to assist with caring for THA due to potential dementia. Wife voices concerns with safety concerns for hip. Pt reports she feels safe on a physical level and does not feel she will be abused but is concerned for how he treats/assists with level of care.   PERTINENT HISTORY: Reverse TSA Osteopenia HTN PAIN:  Are you having pain? Yes: NPRS scale:  2-3/10 Pain location: Right hip Pain description: dull/achy Aggravating factors: fast movements Relieving factors: OTC medications, Iceman  PRECAUTIONS: Other: No ER/IR and straight leg.   RED FLAGS: None   WEIGHT BEARING RESTRICTIONS: No  FALLS:  Has patient fallen in last 6 months? No   PATIENT GOALS: "do more on my own and get comfortable doing"   OBJECTIVE:  Note: Objective measures were completed at Evaluation unless otherwise noted.  PATIENT SURVEYS:  LEFS 22/80 = 27.5% ABC Scale: 1070/1600= 66.9%  COGNITION: Overall cognitive status: Within functional limits for tasks assessed     SENSATION: WFL  POSTURE: increased thoracic kyphosis  LOWER EXTREMITY ROM:  Active ROM Right eval Left eval  Hip flexion    Hip extension    Hip abduction    Hip adduction    Hip internal rotation    Hip external rotation    Knee flexion    Knee extension    Ankle dorsiflexion    Ankle plantarflexion    Ankle inversion    Ankle eversion     (Blank rows = not tested)  LOWER EXTREMITY MMT:  MMT Right eval Left eval  Hip flexion    Hip extension    Hip abduction    Hip adduction    Hip internal rotation    Hip external rotation    Knee flexion    Knee extension    Ankle dorsiflexion    Ankle plantarflexion    Ankle inversion    Ankle eversion     (Blank rows = not tested)   FUNCTIONAL TESTS:  30 seconds chair stand test:6x w/ 2 HHA on RW  2 minute walk test: 151ft  GAIT: Distance walked: 175ft Assistive device utilized: Environmental consultant - 2 wheeled Level of assistance: Modified independence Comments: mild right R circumduction during swing phase, limited knee flexion in R lower extremity                                                                                                                                TREATMENT DATE:  08/14/2023  -Nustep x 4' with lvl 4  -Transitions from sitting to standing with reducing antalgia pattern x 5 with cues for R hip  extension and leading with LLE for stepping. -Standing Hip ext at // Bar with yellow TB around R ankle, 2x10, focus on ecc control -Standing hip abd at // bar  with yellow TB around R ankle, 2x10, focus on ecc control  -Standing lunges on bosu ball with isometric hip extension parallel bars, stepping on/off without UE support -SLS bilaterally x 30'' with finger tip assist.  -SLS on RLE w/ LLE tapping cones placed forward, laterally, and backward. 2x10, finger tip assist, 2nd second with slight bend in knee for inc quad activation -Sideways walk out x 2 with #2 plate  4/0/98: Standing:  Isometric abduction into ball 10x 5" Sit to stand eccentric control with GTB around thigh holding tidal tank eccentric control 10x 2 sets (cueing for equal weight bearing) Sidestep GTB around thigh 5RT Vector stance 5x 5"  Reciprocal pattern 7in 2RT 6in step up 15x 3D hip excursion (rotation, lateral weight shift, squat, extension) 6in Lateral lunge with UE support 10x  08/07/2023   Therapeutic Exercise: -Nustep, 6 minutes with level 1 resistance and cued for at least 60 spm -Supine Bridges with GTB, 2 sets of 10 reps -Standing 3 way hip, bilaterally, 2 sets of 10 reps (2 set with GTB around knees -6in lateral stepup and overs 2x10, pt demonstrates decreased ability to WB on RLE compared to the LLE  Therapeutic Activity: -6in forward stepups with cues for hip extension 2x10 -STS to arm chair with 5lb KB, 1 set of 10 reps -STS from arm chair, targeted RLE staggered stance, 1 set of 10 reps   PATIENT EDUCATION:  Education details: PT Evaluation, findings, prognosis, frequency, attendance policy, and HEP if given.  Person educated: Patient Education method: Medical illustrator Education comprehension: verbalized understanding  HOME EXERCISE PROGRAM: Access Code: [email or web address] URL: [email or web address] Date: 07/10/2023 Prepared by: Starling Manns  Exercises - Seated Ankle  Pumps  - 1 x daily - 7 x weekly - 2-3 sets - 20 reps - Seated Long Arc Quad  - 1 x daily - 7 x weekly - 2-3 sets - 20 reps - Supine Bridge  - 1 x daily - 7 x weekly - 2-3 sets - 10-12 reps - Supine Heel Slide  - 1 x daily - 7 x weekly - 2-3 sets - 20 reps - Supine Quad Set  - 1 x daily - 7 x weekly - 2-3 sets - 20 reps  Access Code: [email or web address] URL: [email or web address] Date: 07/18/2023 Prepared by: Becky Sax  Exercises - Supine Hip Adduction Isometric with Ball  - 2 x daily - 7 x weekly - 2 sets - 10 reps - 5" hold - Hooklying Isometric Hip Abduction with Belt  - 2 x daily - 7 x weekly - 2 sets - 10 reps - 5" hold Access Code: [email or web address] URL: [email or web address] Date: 07/25/2023 Prepared by: Starling Manns  Exercises - Clamshell  - 1 x daily - 7 x weekly - 3 sets - 10 reps - Supine Bridge with Resistance Band  - 1 x daily - 7 x weekly - 3 sets - 10 reps Access Code: [email or web address] URL: [email or web address] Date: 08/03/2023 Prepared by: Starling Manns  Exercises - Standing Tandem Balance with Counter Support  - 1 x daily - 7 x weekly - 3 sets - 10 reps - Single Leg Stance with Support  - 1 x daily - 7 x weekly - 3 sets - 10 reps - Standing Isometric Hip Abduction with Knee at 90 at Wall  - 1 x daily - 7 x weekly - 3 sets - 10 reps - 10 hold  ASSESSMENT:  CLINICAL IMPRESSION: Pt tolerating therapy session well. Focused on increasing single leg stance for balance  and gluteal strengthening. Pt continues with antalgia when transitioning out of sitting into walking which is limiting speed and balance reactions. Pt showing increased hesitancy with RLE balancing but tolerating challenges well. Poor hip control during closed chained activities due to gluteal weakness, particularly in gluteus medius.  Pt will benefit from skilled Physical Therapy services to address deficits/limitations in order to improve functional and QOL.   Pt at 6 weeks  post-op.  Session focus on proximal strengthening, hip mobility and progressed functional strengthening.  Added glut specific strengthening with min cueing for form and mechanics.  Pt continues to demonstrate weakness iwht hip flexion and abduction noted with increased UE support required with step up training and difficulty with balance activities.  NO reports of increased pain through session.       OBJECTIVE IMPAIRMENTS: Abnormal gait, decreased activity tolerance, decreased balance, decreased endurance, decreased mobility, difficulty walking, decreased ROM, decreased strength, postural dysfunction, and pain.   ACTIVITY LIMITATIONS: carrying, lifting, bending, sitting, standing, squatting, sleeping, stairs, transfers, bed mobility, toileting, dressing, and locomotion level  PARTICIPATION LIMITATIONS: shopping, community activity, occupation, and yard work  PERSONAL FACTORS: Age, Past/current experiences, and 3+ comorbidities: see above for details  are also affecting patient's functional outcome.   REHAB POTENTIAL: Good  CLINICAL DECISION MAKING: Stable/uncomplicated  EVALUATION COMPLEXITY: Low   GOALS: Goals reviewed with patient? No  SHORT TERM GOALS: Target date: 08/07/2023  Pt will be independent with HEP in order to demonstrate participation in Physical Therapy POC.  Baseline: Goal status: INITIAL  2.  Pt will 2/10 pain during mobility in order to demonstrate improved pain with functional activities.  Baseline:  Goal status: INITIAL  LONG TERM GOALS: Target date: 09/04/2023  Pt will improve 30 Second Chair Stand Test by >4 reps  in order to demonstrate improved functional strength to return to desired activities.  Baseline: See objective.  Goal status: INITIAL  2.  Pt will improve 2 MWT by 245ft in order to demonstrate improved functional ambulatory capacity in community setting.  Baseline: See objective.  Goal status: INITIAL  3.  Pt will improve LEFS score by 20  points in order to demonstrate improved outcomes with functional tasks and ADLs. Baseline: See objective.  Goal status: INITIAL  4.  Pt will improve ABC scale by 20 points in order to demonstrated improved safety and balance during functional activities.. Baseline: See objective.  Goal status: INITIAL    PLAN:  PT FREQUENCY: 2x/week  PT DURATION: 6 weeks  PLANNED INTERVENTIONS: 97164- PT Re-evaluation, 97110-Therapeutic exercises, 97530- Therapeutic activity, 97112- Neuromuscular re-education, 97535- Self Care, 91478- Manual therapy, [id number]- Gait training, 97014- Electrical stimulation (unattended), [id number]- Electrical stimulation (manual), [id number]- Traction (mechanical), Balance training, Stair training, Cryotherapy, and Moist heat  PLAN FOR NEXT SESSION: ROM, early BLE strengthening, no active ER/IR of R hip currently, no hip flexion past 90, progress squatting and reaching activities.   Nelida Meuse PT, DPT [facility] [phone] office  Nelida Meuse, PT 08/14/2023, 10:17 AM

## 2023-08-15 MED ORDER — HYDRALAZINE HCL 50 MG PO TABS
50 | ORAL_TABLET | Freq: Three times a day (TID) | ORAL | 1 refills | Status: DC
Start: 2023-08-15 — End: 2023-08-15

## 2023-08-15 MED ORDER — HYDRALAZINE HCL 50 MG PO TABS
50 | ORAL_TABLET | Freq: Three times a day (TID) | ORAL | 1 refills | Status: DC
Start: 2023-08-15 — End: 2024-06-12

## 2023-08-16 DIAGNOSIS — M1611 Unilateral primary osteoarthritis, right hip: Secondary | ICD-10-CM | POA: Diagnosis not present

## 2023-08-17 ENCOUNTER — Encounter (HOSPITAL_COMMUNITY): Payer: Self-pay

## 2023-08-17 ENCOUNTER — Ambulatory Visit (HOSPITAL_COMMUNITY): Payer: BLUE CROSS/BLUE SHIELD

## 2023-08-17 DIAGNOSIS — M6281 Muscle weakness (generalized): Secondary | ICD-10-CM | POA: Diagnosis not present

## 2023-08-17 DIAGNOSIS — Z7409 Other reduced mobility: Secondary | ICD-10-CM

## 2023-08-17 DIAGNOSIS — Z96641 Presence of right artificial hip joint: Secondary | ICD-10-CM

## 2023-08-17 NOTE — Therapy (Addendum)
 OUTPATIENT PHYSICAL THERAPY LOWER EXTREMITY TREATMENT/PROGRESS NOTE   Progress Note Reporting Period 07/10/23 to 09/04/23  See note below for Objective Data and Assessment of Progress/Goals.       Patient Name: Terri Douglas MRN: [id number] DOB:Dec 18, 1945, 78 y.o., female Today's Date: 08/17/2023  END OF SESSION:  PT End of Session - 08/17/23 1017     Visit Number 10    Number of Visits 12    Date for PT Re-Evaluation 09/04/23    Authorization Type Medicare A & B; BCBS supplement    Authorization Time Period no auth no limit    Progress Note Due on Visit 10    PT Start Time 0931    PT Stop Time 1016    PT Time Calculation (min) 45 min    Activity Tolerance Patient tolerated treatment well    Behavior During Therapy WFL for tasks assessed/performed             Past Medical History:  Diagnosis Date   Arthritis    Complication of anesthesia    Gall stones    GERD (gastroesophageal reflux disease)    History of cardiac catheterization 2005   Minor elevation in cardiac enzymes however no significant obstructive CAD   Hyperlipidemia    Hypertension    Insomnia    PONV (postoperative nausea and vomiting)    Prediabetes    Past Surgical History:  Procedure Laterality Date   A-FLUTTER ABLATION N/A 08/01/2017   Procedure: A-FLUTTER ABLATION;  Surgeon: Hillis Range, MD;  Location: [facility];  Service: Cardiovascular;  Laterality: N/A;   ABDOMINAL HYSTERECTOMY     BACK SURGERY  1992   CARDIAC CATHETERIZATION     BACK IN 2004  SHE THINKS IT CAME BACK 'NORMAL'   CHOLECYSTECTOMY N/A 06/24/2015   Procedure: LAPAROSCOPIC CHOLECYSTECTOMY;  Surgeon: Rodman Pickle, MD;  Location: [facility] OR;  Service: General;  Laterality: N/A;   COLONOSCOPY N/A 08/30/2012   Procedure: COLONOSCOPY;  Surgeon: Malissa Hippo, MD;  Location: [facility];  Service: Endoscopy;  Laterality: N/A;  830   DILATION AND CURETTAGE OF UTERUS     ESOPHAGOGASTRODUODENOSCOPY  04/15/2011    Procedure: ESOPHAGOGASTRODUODENOSCOPY (EGD);  Surgeon: Malissa Hippo, MD;  Location: [facility];  Service: Endoscopy;  Laterality: N/A;  11:30   ESOPHAGOGASTRODUODENOSCOPY N/A 05/27/2016   Procedure: ESOPHAGOGASTRODUODENOSCOPY (EGD);  Surgeon: Malissa Hippo, MD;  Location: [facility];  Service: Endoscopy;  Laterality: N/A;  11:15   EYE SURGERY     cataract removal   NECK SURGERY  12/13/2018   REVERSE SHOULDER ARTHROPLASTY Left 04/03/2019   Procedure: REVERSE SHOULDER ARTHROPLASTY;  Surgeon: Bjorn Pippin, MD;  Location: [facility];  Service: Orthopedics;  Laterality: Left;   VAGINAL HYSTERECTOMY     Patient Active Problem List   Diagnosis Date Noted   Petechiae 08/08/2023   Chronic right-sided low back pain with right-sided sciatica 04/12/2023   History of atrial flutter 04/20/2022   Paroxysmal SVT (supraventricular tachycardia) (HCC) 04/20/2022   Laryngopharyngeal reflux (LPR) 09/28/2020   Primary osteoarthritis of right hip 01/17/2020   Pedal edema 11/19/2019   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   Insomnia 03/15/2019   Hyperlipidemia 06/18/2014   Gout 06/18/2014   Osteopenia 10/28/2013   Hypertension 03/29/2011    PCP: Babs Sciara, MD  REFERRING PROVIDER:   Teryl Lucy, MD  Next apt 08/16/23  REFERRING DIAG: R POST THR on 07/06/2023  THERAPY DIAG:  S/P total right  hip arthroplasty  Impaired functional mobility, balance, gait, and endurance  Muscle weakness (generalized)  Rationale for Evaluation and Treatment: Rehabilitation  ONSET DATE: 07/06/2023  SUBJECTIVE:   SUBJECTIVE STATEMENT: Pt continues to report 2-3/10 pain. Says she still has issues with balance, stiffness, and going up stairs at home.  Eval:  Lateral approach total hip arthroplasty on 07/06/2023. Pt voices concerns with husband ability to assist with caring for THA due to potential dementia. Wife voices concerns with safety concerns for hip. Pt reports she feels safe  on a physical level and does not feel she will be abused but is concerned for how he treats/assists with level of care.   PERTINENT HISTORY: Reverse TSA Osteopenia HTN PAIN:  Are you having pain? Yes: NPRS scale: 2-3/10 Pain location: Right hip Pain description: dull/achy Aggravating factors: fast movements Relieving factors: OTC medications, Iceman  PRECAUTIONS: Other: No ER/IR and straight leg.   RED FLAGS: None   WEIGHT BEARING RESTRICTIONS: No  FALLS:  Has patient fallen in last 6 months? No   PATIENT GOALS: "do more on my own and get comfortable doing"   OBJECTIVE:  Note: Objective measures were completed at Evaluation unless otherwise noted.  PATIENT SURVEYS:  LEFS 22/80 = 27.5% ABC Scale: 1070/1600= 66.9%  08/17/23: LEFS: 63/80=78% ABD Scale: 960/1600= 60%   COGNITION: Overall cognitive status: Within functional limits for tasks assessed     SENSATION: WFL  POSTURE: increased thoracic kyphosis  LOWER EXTREMITY ROM:  Active ROM Right eval Left eval  Hip flexion    Hip extension    Hip abduction    Hip adduction    Hip internal rotation    Hip external rotation    Knee flexion    Knee extension    Ankle dorsiflexion    Ankle plantarflexion    Ankle inversion    Ankle eversion     (Blank rows = not tested)  LOWER EXTREMITY MMT:  MMT Right eval Left eval  Hip flexion    Hip extension    Hip abduction    Hip adduction    Hip internal rotation    Hip external rotation    Knee flexion    Knee extension    Ankle dorsiflexion    Ankle plantarflexion    Ankle inversion    Ankle eversion     (Blank rows = not tested)   FUNCTIONAL TESTS:  30 seconds chair stand test:6x w/ 2 HHA on RW  2 minute walk test: 115 ft  08/17/23 Progress Note 30x STS: 15 x, no AD : 338 ft, no AD  GAIT: Distance walked: 131ft Assistive device utilized: Environmental consultant - 2 wheeled Level of assistance: Modified independence Comments: mild right R circumduction  during swing phase, limited knee flexion in R lower extremity  TREATMENT DATE:  08/17/23 NuStep x 5', level 5  Progress Note  -ABC Scale -LEFS -30 sec STS - Test  Stair mechanics: Re-educ for "up with the good, down with bad" for safety at this time, step to pattern, using L railing ascending. Trial with RLE leading, mild buckling in RLE Lateral step downs, 6", 2x8, tactile cueing on R hip for glute med engagement   08/14/2023  -Nustep x 4' with lvl 4  -Transitions from sitting to standing with reducing antalgia pattern x 5 with cues for R hip extension and leading with LLE for stepping. -Standing Hip ext at // Bar with yellow TB around R ankle, 2x10, focus on ecc control -Standing hip abd at // bar with yellow TB around R ankle, 2x10, focus on ecc control  -Standing lunges on bosu ball with isometric hip extension parallel bars, stepping on/off without UE support -SLS bilaterally x 30'' with finger tip assist.  -SLS on RLE w/ LLE tapping cones placed forward, laterally, and backward. 2x10, finger tip assist, 2nd second with slight bend in knee for inc quad activation -Sideways walk out x 2 with #2 plate  12/12/27: Standing:  Isometric abduction into ball 10x 5" Sit to stand eccentric control with GTB around thigh holding tidal tank eccentric control 10x 2 sets (cueing for equal weight bearing) Sidestep GTB around thigh 5RT Vector stance 5x 5"  Reciprocal pattern 7in 2RT 6in step up 15x 3D hip excursion (rotation, lateral weight shift, squat, extension) 6in Lateral lunge with UE support 10x  08/07/2023   Therapeutic Exercise: -Nustep, 6 minutes with level 1 resistance and cued for at least 60 spm -Supine Bridges with GTB, 2 sets of 10 reps -Standing 3 way hip, bilaterally, 2 sets of 10 reps (2 set with GTB around knees -6in lateral stepup and overs  2x10, pt demonstrates decreased ability to WB on RLE compared to the LLE  Therapeutic Activity: -6in forward stepups with cues for hip extension 2x10 -STS to arm chair with 5lb KB, 1 set of 10 reps -STS from arm chair, targeted RLE staggered stance, 1 set of 10 reps   PATIENT EDUCATION:  Education details: PT Evaluation, findings, prognosis, frequency, attendance policy, and HEP if given.  Person educated: Patient Education method: Medical illustrator Education comprehension: verbalized understanding  HOME EXERCISE PROGRAM: Access Code: [email or web address] URL: [email or web address] Date: 07/10/2023 Prepared by: Starling Manns  Exercises - Seated Ankle Pumps  - 1 x daily - 7 x weekly - 2-3 sets - 20 reps - Seated Long Arc Quad  - 1 x daily - 7 x weekly - 2-3 sets - 20 reps - Supine Bridge  - 1 x daily - 7 x weekly - 2-3 sets - 10-12 reps - Supine Heel Slide  - 1 x daily - 7 x weekly - 2-3 sets - 20 reps - Supine Quad Set  - 1 x daily - 7 x weekly - 2-3 sets - 20 reps  Access Code: [email or web address] URL: [email or web address] Date: 07/18/2023 Prepared by: Becky Sax  Exercises - Supine Hip Adduction Isometric with Ball  - 2 x daily - 7 x weekly - 2 sets - 10 reps - 5" hold - Hooklying Isometric Hip Abduction with Belt  - 2 x daily - 7 x weekly - 2 sets - 10 reps - 5" hold Access Code: [email or web address] URL: [email or web address] Date: 07/25/2023 Prepared by: Starling Manns  Exercises - Clamshell  - 1 x daily - 7 x weekly - 3 sets - 10 reps -  Supine Bridge with Resistance Band  - 1 x daily - 7 x weekly - 3 sets - 10 reps Access Code: [email or web address] URL: [email or web address] Date: 08/03/2023 Prepared by: Starling Manns  Exercises - Standing Tandem Balance with Counter Support  - 1 x daily - 7 x weekly - 3 sets - 10 reps - Single Leg Stance with Support  - 1 x daily - 7 x weekly - 3 sets - 10 reps - Standing Isometric Hip Abduction  with Knee at 90 at Wall  - 1 x daily - 7 x weekly - 3 sets - 10 reps - 10 hold  Access Code: [email or web address] URL: [email or web address] Date: 08/17/2023 Prepared by: Fabiola Backer Powell-Butler  Exercises - Seated Ankle Pumps  - 1 x daily - 7 x weekly - 2-3 sets - 20 reps - Seated Long Arc Quad  - 1 x daily - 7 x weekly - 2-3 sets - 20 reps - Supine Bridge  - 1 x daily - 7 x weekly - 2-3 sets - 10-12 reps - Supine Heel Slide  - 1 x daily - 7 x weekly - 2-3 sets - 20 reps - Supine Quad Set  - 1 x daily - 7 x weekly - 2-3 sets - 20 reps - Supine Hip Adduction Isometric with Ball  - 2 x daily - 7 x weekly - 2 sets - 10 reps - 5" hold - Hooklying Isometric Hip Abduction with Belt  - 2 x daily - 7 x weekly - 2 sets - 10 reps - 5" hold - Lateral Step Down  - 2 x daily - 7 x weekly - 2 sets - 10 reps  ASSESSMENT:  CLINICAL IMPRESSION: Progress note performed on this date. Patient demonstrated good progress towards goals with improvements shown in 2 minute walk and 30 second sit to stand tests. Patient continues to display impairments with R hip musculature weakness, and stiffness which are contributing to her difficulty with initial sit to stand, ambulating after prolonged sitting, and navigating stairs. Mild buckling is displayed when patient ascends stairs with RLE. Re-education is given for leading with LLE and descending with RLE at home for safety. Patient will benefit from continued skilled physical therapy in order to address the above impairments to return to [facility] safety.   Pt at 6 weeks post-op.  Session focus on proximal strengthening, hip mobility and progressed functional strengthening.  Added glut specific strengthening with min cueing for form and mechanics.  Pt continues to demonstrate weakness iwht hip flexion and abduction noted with increased UE support required with step up training and difficulty with balance activities.  NO reports of increased pain through session.        OBJECTIVE IMPAIRMENTS: Abnormal gait, decreased activity tolerance, decreased balance, decreased endurance, decreased mobility, difficulty walking, decreased ROM, decreased strength, postural dysfunction, and pain.   ACTIVITY LIMITATIONS: carrying, lifting, bending, sitting, standing, squatting, sleeping, stairs, transfers, bed mobility, toileting, dressing, and locomotion level  PARTICIPATION LIMITATIONS: shopping, community activity, occupation, and yard work  PERSONAL FACTORS: Age, Past/current experiences, and 3+ comorbidities: see above for details  are also affecting patient's functional outcome.   REHAB POTENTIAL: Good  CLINICAL DECISION MAKING: Stable/uncomplicated  EVALUATION COMPLEXITY: Low   GOALS: Goals reviewed with patient? No  SHORT TERM GOALS: Target date: 08/07/2023  Pt will be independent with HEP in order to demonstrate participation in Physical Therapy POC.  Baseline: Goal status: IN PROGRESS  2.  Pt will 2/10 pain during mobility in order to demonstrate improved  pain with functional activities.  Baseline:  Goal status: IN PROGRESS  LONG TERM GOALS: Target date: 09/04/2023  Pt will improve 30 Second Chair Stand Test by 10>reps  in order to demonstrate improved functional strength to return to desired activities.  UPDATED GOAL Baseline: See objective.  Goal status: REVISED  2.  Pt will improve 2 MWT by 339ft from initial in order to demonstrate improved functional ambulatory capacity in community setting.  Baseline: See objective.  Goal status: REVISED  3.  Pt will improve LEFS score by 30 points in order to demonstrate improved outcomes with functional tasks and ADLs. Baseline: See objective.  Goal status: REVISED  4.  Pt will improve ABC scale by 30 points in order to demonstrated improved safety and balance during functional activities.. Baseline: See objective.  Goal status: REVISED    PLAN:  PT FREQUENCY: 2x/week  PT DURATION: 6  weeks  PLANNED INTERVENTIONS: 97164- PT Re-evaluation, 97110-Therapeutic exercises, 97530- Therapeutic activity, 97112- Neuromuscular re-education, 97535- Self Care, 52841- Manual therapy, [id number]- Gait training, 97014- Electrical stimulation (unattended), [id number]- Electrical stimulation (manual), [id number]- Traction (mechanical), Balance training, Stair training, Cryotherapy, and Moist heat  PLAN FOR NEXT SESSION: ROM, early BLE strengthening, no active ER/IR of R hip currently, no hip flexion past 90, progress squatting and reaching activities.   10:57 AM, 08/17/23 Chryl Heck, PT, DPT [facility] [place]

## 2023-08-21 ENCOUNTER — Ambulatory Visit (HOSPITAL_COMMUNITY): Payer: BLUE CROSS/BLUE SHIELD

## 2023-08-21 ENCOUNTER — Encounter (HOSPITAL_COMMUNITY): Payer: Self-pay

## 2023-08-21 DIAGNOSIS — Z96641 Presence of right artificial hip joint: Secondary | ICD-10-CM | POA: Diagnosis not present

## 2023-08-21 DIAGNOSIS — Z7409 Other reduced mobility: Secondary | ICD-10-CM | POA: Diagnosis not present

## 2023-08-21 DIAGNOSIS — M6281 Muscle weakness (generalized): Secondary | ICD-10-CM

## 2023-08-21 NOTE — Therapy (Signed)
 OUTPATIENT PHYSICAL THERAPY LOWER EXTREMITY TREATMENT   Patient Name: Terri Douglas MRN: [id number] DOB:14-Apr-1946, 78 y.o., female Today's Date: 08/21/2023  END OF SESSION:  PT End of Session - 08/21/23 0934     Visit Number 11    Number of Visits 12    Date for PT Re-Evaluation 09/04/23    Authorization Type Medicare A & B; BCBS supplement    Authorization Time Period no auth no limit    Progress Note Due on Visit 20   PN done on visit #10   PT Start Time 0935    PT Stop Time 1015    PT Time Calculation (min) 40 min    Activity Tolerance Patient tolerated treatment well    Behavior During Therapy WFL for tasks assessed/performed             Past Medical History:  Diagnosis Date   Arthritis    Complication of anesthesia    Gall stones    GERD (gastroesophageal reflux disease)    History of cardiac catheterization 2005   Minor elevation in cardiac enzymes however no significant obstructive CAD   Hyperlipidemia    Hypertension    Insomnia    PONV (postoperative nausea and vomiting)    Prediabetes    Past Surgical History:  Procedure Laterality Date   A-FLUTTER ABLATION N/A 08/01/2017   Procedure: A-FLUTTER ABLATION;  Surgeon: Hillis Range, MD;  Location: [facility];  Service: Cardiovascular;  Laterality: N/A;   ABDOMINAL HYSTERECTOMY     BACK SURGERY  1992   CARDIAC CATHETERIZATION     BACK IN 2004  SHE THINKS IT CAME BACK 'NORMAL'   CHOLECYSTECTOMY N/A 06/24/2015   Procedure: LAPAROSCOPIC CHOLECYSTECTOMY;  Surgeon: Rodman Pickle, MD;  Location: [facility] OR;  Service: General;  Laterality: N/A;   COLONOSCOPY N/A 08/30/2012   Procedure: COLONOSCOPY;  Surgeon: Malissa Hippo, MD;  Location: [facility];  Service: Endoscopy;  Laterality: N/A;  830   DILATION AND CURETTAGE OF UTERUS     ESOPHAGOGASTRODUODENOSCOPY  04/15/2011   Procedure: ESOPHAGOGASTRODUODENOSCOPY (EGD);  Surgeon: Malissa Hippo, MD;  Location: [facility];  Service: Endoscopy;   Laterality: N/A;  11:30   ESOPHAGOGASTRODUODENOSCOPY N/A 05/27/2016   Procedure: ESOPHAGOGASTRODUODENOSCOPY (EGD);  Surgeon: Malissa Hippo, MD;  Location: [facility];  Service: Endoscopy;  Laterality: N/A;  11:15   EYE SURGERY     cataract removal   NECK SURGERY  12/13/2018   REVERSE SHOULDER ARTHROPLASTY Left 04/03/2019   Procedure: REVERSE SHOULDER ARTHROPLASTY;  Surgeon: Bjorn Pippin, MD;  Location: [facility];  Service: Orthopedics;  Laterality: Left;   VAGINAL HYSTERECTOMY     Patient Active Problem List   Diagnosis Date Noted   Petechiae 08/08/2023   Chronic right-sided low back pain with right-sided sciatica 04/12/2023   History of atrial flutter 04/20/2022   Paroxysmal SVT (supraventricular tachycardia) (HCC) 04/20/2022   Laryngopharyngeal reflux (LPR) 09/28/2020   Primary osteoarthritis of right hip 01/17/2020   Pedal edema 11/19/2019   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   Insomnia 03/15/2019   Hyperlipidemia 06/18/2014   Gout 06/18/2014   Osteopenia 10/28/2013   Hypertension 03/29/2011    PCP: Babs Sciara, MD  REFERRING PROVIDER:   Teryl Lucy, MD  Next apt 08/16/23  REFERRING DIAG: R POST THR on 07/06/2023  THERAPY DIAG:  S/P total right hip arthroplasty  Impaired functional mobility, balance, gait, and endurance  Muscle weakness (generalized)  Rationale for Evaluation and  Treatment: Rehabilitation  ONSET DATE: 07/06/2023  SUBJECTIVE:   SUBJECTIVE STATEMENT: Pt continues to report 2-3/10 pain. Says she still has issues with balance, stiffness, and going up stairs at home.  Eval:  Lateral approach total hip arthroplasty on 07/06/2023. Pt voices concerns with husband ability to assist with caring for THA due to potential dementia. Wife voices concerns with safety concerns for hip. Pt reports she feels safe on a physical level and does not feel she will be abused but is concerned for how he treats/assists with level of care.    PERTINENT HISTORY: Reverse TSA Osteopenia HTN PAIN:  Are you having pain? Yes: NPRS scale: 2-3/10 Pain location: Right hip Pain description: dull/achy Aggravating factors: fast movements Relieving factors: OTC medications, Iceman  PRECAUTIONS: Other: No ER/IR and straight leg.   RED FLAGS: None   WEIGHT BEARING RESTRICTIONS: No  FALLS:  Has patient fallen in last 6 months? No   PATIENT GOALS: "do more on my own and get comfortable doing"   OBJECTIVE:  Note: Objective measures were completed at Evaluation unless otherwise noted.  PATIENT SURVEYS:  LEFS 22/80 = 27.5% ABC Scale: 1070/1600= 66.9%  08/17/23: LEFS: 63/80= ABD Scale: 960/1600= 60%   COGNITION: Overall cognitive status: Within functional limits for tasks assessed     SENSATION: WFL  POSTURE: increased thoracic kyphosis  LOWER EXTREMITY ROM:  Active ROM Right eval Left eval  Hip flexion    Hip extension    Hip abduction    Hip adduction    Hip internal rotation    Hip external rotation    Knee flexion    Knee extension    Ankle dorsiflexion    Ankle plantarflexion    Ankle inversion    Ankle eversion     (Blank rows = not tested)  LOWER EXTREMITY MMT:  MMT Right eval Left eval  Hip flexion    Hip extension    Hip abduction    Hip adduction    Hip internal rotation    Hip external rotation    Knee flexion    Knee extension    Ankle dorsiflexion    Ankle plantarflexion    Ankle inversion    Ankle eversion     (Blank rows = not tested)   FUNCTIONAL TESTS:  30 seconds chair stand test:6x w/ 2 HHA on RW  2 minute walk test: 115 ft  08/17/23 Progress Note 30x STS: 15 x, no AD : 338 ft, no AD  GAIT: Distance walked: 158ft Assistive device utilized: Environmental consultant - 2 wheeled Level of assistance: Modified independence Comments: mild right R circumduction during swing phase, limited knee flexion in R lower extremity                                                                                                                                 TREATMENT DATE:  08/21/23: Rockerboard R/L then DF/PF each mirror feedback Squat front of  chair with RTB around thigh cueing for equal weight bearing Split stance squat 10x Sidestep 2RT (1st legs straight, 2nd squat position) 6in power up with 1 HHA required 15x 5" 6in lateral step 15x Leg press 3Pl 2x 10 slow mechanics Forward lunge onto BOSU 15x with minimal HHA   08/17/23 NuStep x 5', level 5  Progress Note  -ABC Scale -LEFS -30 sec STS - Test  Stair mechanics: Re-educ for "up with the good, down with bad" for safety at this time, step to pattern, using L railing ascending. Trial with RLE leading, mild buckling in RLE Lateral step downs, 6", 2x8, tactile cueing on R hip for glute med engagement   08/14/2023  -Nustep x 4' with lvl 4  -Transitions from sitting to standing with reducing antalgia pattern x 5 with cues for R hip extension and leading with LLE for stepping. -Standing Hip ext at // Bar with yellow TB around R ankle, 2x10, focus on ecc control -Standing hip abd at // bar with yellow TB around R ankle, 2x10, focus on ecc control  -Standing lunges on bosu ball with isometric hip extension parallel bars, stepping on/off without UE support -SLS bilaterally x 30'' with finger tip assist.  -SLS on RLE w/ LLE tapping cones placed forward, laterally, and backward. 2x10, finger tip assist, 2nd second with slight bend in knee for inc quad activation -Sideways walk out x 2 with #2 plate  02/08/62: Standing:  Isometric abduction into ball 10x 5" Sit to stand eccentric control with GTB around thigh holding tidal tank eccentric control 10x 2 sets (cueing for equal weight bearing) Sidestep GTB around thigh 5RT Vector stance 5x 5"  Reciprocal pattern 7in 2RT 6in step up 15x 3D hip excursion (rotation, lateral weight shift, squat, extension) 6in Lateral lunge with UE support 10x  08/07/2023   Therapeutic  Exercise: -Nustep, 6 minutes with level 1 resistance and cued for at least 60 spm -Supine Bridges with GTB, 2 sets of 10 reps -Standing 3 way hip, bilaterally, 2 sets of 10 reps (2 set with GTB around knees -6in lateral stepup and overs 2x10, pt demonstrates decreased ability to WB on RLE compared to the LLE  Therapeutic Activity: -6in forward stepups with cues for hip extension 2x10 -STS to arm chair with 5lb KB, 1 set of 10 reps -STS from arm chair, targeted RLE staggered stance, 1 set of 10 reps   PATIENT EDUCATION:  Education details: PT Evaluation, findings, prognosis, frequency, attendance policy, and HEP if given.  Person educated: Patient Education method: Medical illustrator Education comprehension: verbalized understanding  HOME EXERCISE PROGRAM: Access Code: [email or web address] URL: [email or web address] Date: 07/10/2023 Prepared by: Starling Manns  Exercises - Seated Ankle Pumps  - 1 x daily - 7 x weekly - 2-3 sets - 20 reps - Seated Long Arc Quad  - 1 x daily - 7 x weekly - 2-3 sets - 20 reps - Supine Bridge  - 1 x daily - 7 x weekly - 2-3 sets - 10-12 reps - Supine Heel Slide  - 1 x daily - 7 x weekly - 2-3 sets - 20 reps - Supine Quad Set  - 1 x daily - 7 x weekly - 2-3 sets - 20 reps  Access Code: [email or web address] URL: [email or web address] Date: 07/18/2023 Prepared by: Becky Sax  Exercises - Supine Hip Adduction Isometric with Ball  - 2 x daily - 7 x weekly - 2 sets - 10 reps - 5" hold - Hooklying Isometric Hip Abduction with  Belt  - 2 x daily - 7 x weekly - 2 sets - 10 reps - 5" hold Access Code: [email or web address] URL: [email or web address] Date: 07/25/2023 Prepared by: Starling Manns  Exercises - Clamshell  - 1 x daily - 7 x weekly - 3 sets - 10 reps - Supine Bridge with Resistance Band  - 1 x daily - 7 x weekly - 3 sets - 10 reps Access Code: [email or web address] URL: [email or web address] Date: 08/03/2023 Prepared  by: Starling Manns  Exercises - Standing Tandem Balance with Counter Support  - 1 x daily - 7 x weekly - 3 sets - 10 reps - Single Leg Stance with Support  - 1 x daily - 7 x weekly - 3 sets - 10 reps - Standing Isometric Hip Abduction with Knee at 90 at Wall  - 1 x daily - 7 x weekly - 3 sets - 10 reps - 10 hold  Access Code: [email or web address] URL: [email or web address] Date: 08/17/2023 Prepared by: Fabiola Backer Powell-Butler  Exercises - Seated Ankle Pumps  - 1 x daily - 7 x weekly - 2-3 sets - 20 reps - Seated Long Arc Quad  - 1 x daily - 7 x weekly - 2-3 sets - 20 reps - Supine Bridge  - 1 x daily - 7 x weekly - 2-3 sets - 10-12 reps - Supine Heel Slide  - 1 x daily - 7 x weekly - 2-3 sets - 20 reps - Supine Quad Set  - 1 x daily - 7 x weekly - 2-3 sets - 20 reps - Supine Hip Adduction Isometric with Ball  - 2 x daily - 7 x weekly - 2 sets - 10 reps - 5" hold - Hooklying Isometric Hip Abduction with Belt  - 2 x daily - 7 x weekly - 2 sets - 10 reps - 5" hold - Lateral Step Down  - 2 x daily - 7 x weekly - 2 sets - 10 reps  ASSESSMENT:  CLINICAL IMPRESSION: Session focus with gluteal strengthening for stability with gait.  This session progressed strengthening with additional rockerboard, split stance squat and leg press, used theraband resistance to reduce knee valgus and increase glut med activation.  Pt tolerated well this session.  Did require intermittent cueing for improve form and mechanics, SBA during balance associated exercises.  No reports of increased pain through session.    Pt at 6 weeks post-op.  Session focus on proximal strengthening, hip mobility and progressed functional strengthening.  Added glut specific strengthening with min cueing for form and mechanics.  Pt continues to demonstrate weakness iwht hip flexion and abduction noted with increased UE support required with step up training and difficulty with balance activities.  NO reports of increased pain through  session.       OBJECTIVE IMPAIRMENTS: Abnormal gait, decreased activity tolerance, decreased balance, decreased endurance, decreased mobility, difficulty walking, decreased ROM, decreased strength, postural dysfunction, and pain.   ACTIVITY LIMITATIONS: carrying, lifting, bending, sitting, standing, squatting, sleeping, stairs, transfers, bed mobility, toileting, dressing, and locomotion level  PARTICIPATION LIMITATIONS: shopping, community activity, occupation, and yard work  PERSONAL FACTORS: Age, Past/current experiences, and 3+ comorbidities: see above for details  are also affecting patient's functional outcome.   REHAB POTENTIAL: Good  CLINICAL DECISION MAKING: Stable/uncomplicated  EVALUATION COMPLEXITY: Low   GOALS: Goals reviewed with patient? No  SHORT TERM GOALS: Target date: 08/07/2023  Pt will be independent with HEP in order to demonstrate participation in Physical Therapy POC.  Baseline:  Goal status: INITIAL  2.  Pt will 2/10 pain during mobility in order to demonstrate improved pain with functional activities.  Baseline:  Goal status: INITIAL  LONG TERM GOALS: Target date: 09/04/2023  Pt will improve 30 Second Chair Stand Test by >4 reps  in order to demonstrate improved functional strength to return to desired activities.  Baseline: See objective.  Goal status: INITIAL  2.  Pt will improve 2 MWT by 270ft in order to demonstrate improved functional ambulatory capacity in community setting.  Baseline: See objective.  Goal status: INITIAL  3.  Pt will improve LEFS score by 20 points in order to demonstrate improved outcomes with functional tasks and ADLs. Baseline: See objective.  Goal status: INITIAL  4.  Pt will improve ABC scale by 20 points in order to demonstrated improved safety and balance during functional activities.. Baseline: See objective.  Goal status: INITIAL    PLAN:  PT FREQUENCY: 2x/week  PT DURATION: 6 weeks  PLANNED  INTERVENTIONS: 97164- PT Re-evaluation, 97110-Therapeutic exercises, 97530- Therapeutic activity, 97112- Neuromuscular re-education, 97535- Self Care, 16109- Manual therapy, [id number]- Gait training, 97014- Electrical stimulation (unattended), [id number]- Electrical stimulation (manual), [id number]- Traction (mechanical), Balance training, Stair training, Cryotherapy, and Moist heat  PLAN FOR NEXT SESSION: ROM, early BLE strengthening, no active ER/IR of R hip currently, no hip flexion past 90, progress squatting and reaching activities.  Becky Sax, LPTA/CLT; CBIS [phone]  10:33 AM, 08/21/23

## 2023-08-22 ENCOUNTER — Encounter: Payer: Self-pay | Admitting: Nurse Practitioner

## 2023-08-22 ENCOUNTER — Ambulatory Visit: Admitting: Nurse Practitioner

## 2023-08-22 ENCOUNTER — Ambulatory Visit: Payer: Self-pay | Admitting: Family Medicine

## 2023-08-22 VITALS — BP 130/70 | HR 62 | Temp 97.5°F | Ht 62.0 in | Wt 141.0 lb

## 2023-08-22 DIAGNOSIS — R233 Spontaneous ecchymoses: Secondary | ICD-10-CM | POA: Diagnosis not present

## 2023-08-22 DIAGNOSIS — I8393 Asymptomatic varicose veins of bilateral lower extremities: Secondary | ICD-10-CM | POA: Diagnosis not present

## 2023-08-22 NOTE — Progress Notes (Unsigned)
   Subjective:    Patient ID: Terri Douglas, female    DOB: [birth year], 78 y.o.   MRN: [id number]  HPI Presents today complaining of a rash on her bilateral legs that started 3 weeks ago. Was seen by Dr. Adriana Douglas for a rash on her chest on 08/08/2023, see previous note. Feels like the rash has improved with the triamcinolone cream. Had right hip surgery in January of 2025. Was taking aspirin daily to prevent blood clots, but has stopped. Denies any increased bleeding. Does say that she bruises easier. Endorses increased leg swelling on the right leg. Says that her right leg has always had increased swelling due to broken ankle from when she was young. Rash appeared three weeks ago. Denies itching, but does "sting" on her ankles. No new detergents or soaps.  Review of Systems  Respiratory:  Negative for cough, chest tightness, shortness of breath and wheezing.   Cardiovascular:  Positive for leg swelling. Negative for chest pain.  Skin:  Positive for rash.      Objective:   Physical Exam Vitals and nursing note reviewed.  Constitutional:      General: She is not in acute distress.    Appearance: Normal appearance. She is not ill-appearing.  Cardiovascular:     Rate and Rhythm: Normal rate and regular rhythm.     Heart sounds: Normal heart sounds. No murmur heard. Pulmonary:     Effort: Pulmonary effort is normal. No respiratory distress.     Breath sounds: Normal breath sounds. No wheezing.  Musculoskeletal:     Comments: Edema noted on bilateral legs. +1 pitting edema on right leg slightly more increased than the left. Non-pitting edema located on the left leg.   Skin:    Comments: Mild, diffuse petechial rash with erythematous base noted on the anterior bilateral legs. Very faint, mild petechial rash on the chest area.   Neurological:     Mental Status: She is alert.    Vitals:   08/22/23 1518  BP: 130/70  Pulse: 62  Temp: (!) 97.5 F (36.4 C)  Height: 5\' 2"  (1.575 m)  Weight: 141  lb (64 kg)  SpO2: 98%  BMI (Calculated): 25.78       Assessment & Plan:  1. Petechiae (Primary) -Assured patient that the petechial rash is likely due to increased swelling and blood flow since hip surgery in January. Is self-limiting and will resolve on its own over time. Recommended TED hose, and patient says she did not like them when she tried them in the past. Advised her to stay active.  Advised her that if her legs become significantly more swollen, have warmth or redness to the leg, to get evaluated at the ED.   Return if symptoms worsen or fail to improve.

## 2023-08-22 NOTE — Telephone Encounter (Signed)
 Copied from CRM [id number]. Topic: Clinical - Red Word Triage >> Aug 22, 2023 10:56 AM Ivette P wrote: [facility] that prompted transfer to Nurse Triage: Pt has a rash on both legs and group of prickles with bumps. Stings and burns around the ankles, starts at the ankles and goes up passed the knees.  Chief Complaint: rash to bilateral legs; seen in office for this Symptoms: burning rash to bilateral legs Frequency: comes and goes Pertinent Negatives: Patient denies fever, sob,  Disposition: [] ED /[] Urgent Care (no appt availability in office) / [x] Appointment(In office/virtual)/ []  [facility]/ [] Home Care/ [] Refused Recommended Disposition /[]  Mobile Bus/ []  Follow-up with PCP Additional Notes: per protocol apt made for today. Care advice given, denies questions; instructed to go to ER if becomes worse.   Reason for Disposition  [1] Localized rash is very painful AND [2] no fever  Answer Assessment - Initial Assessment Questions 1.) CALLER DIAGNOSIS: "What do you think is causing the rash?" (e.g., athlete's foot, chickenpox, hives, impetigo)  bilateral legs rash that is grouped of prickles with bumps; states it stings and burns around the ankles.  2.) LOCALIZED OR WIDESPREAD:  "Is the rash all over (widespread) or mostly just in one area of the body (localized)?" localized  3.) NEW MEDICINES: "Are you taking any new medicine?" Was on a blood thinner  4.) APPEARANCE of RASH: "Describe the rash. What color is it?" (Note: It is difficult to assess rash color in people with darker-colored skin. When this situation occurs, simply ask the caller to describe what they see.) Brownish color, states some are red.  Answer Assessment - Initial Assessment Questions 1. APPEARANCE of RASH: "Describe the rash."      See other triage note.  2. LOCATION: "Where is the rash located?"      A lot 3. NUMBER: "How many spots are there?"      Too many to count 4. SIZE: "How big are the  spots?" (Inches, centimeters or compare to size of a coin)      Like freckles 5. ONSET: "When did the rash start?"      Three weeks ago; was seen in office 6. ITCHING: "Does the rash itch?" If Yes, ask: "How bad is the itch?"  (Scale 0-10; or none, mild, moderate, severe)     Stinging and burning 7. PAIN: "Does the rash hurt?" If Yes, ask: "How bad is the pain?"  (Scale 0-10; or none, mild, moderate, severe)    - NONE (0): no pain    - MILD (1-3): doesn't interfere with normal activities     - MODERATE (4-7): interferes with normal activities or awakens from sleep     - SEVERE (8-10): excruciating pain, unable to do any normal activities     "Not that bad" 8. OTHER SYMPTOMS: "Do you have any other symptoms?" (e.g., fever)     denies 9. PREGNANCY: "Is there any chance you are pregnant?" "When was your last menstrual period?"     na  Protocols used: Rash - Guideline Selection-A-AH, Rash or Redness - Localized-A-AH

## 2023-08-24 ENCOUNTER — Ambulatory Visit (HOSPITAL_COMMUNITY): Payer: BLUE CROSS/BLUE SHIELD

## 2023-08-24 ENCOUNTER — Encounter (HOSPITAL_COMMUNITY): Payer: Self-pay

## 2023-08-24 ENCOUNTER — Encounter: Payer: Self-pay | Admitting: Nurse Practitioner

## 2023-08-24 DIAGNOSIS — I8393 Asymptomatic varicose veins of bilateral lower extremities: Secondary | ICD-10-CM | POA: Insufficient documentation

## 2023-08-24 DIAGNOSIS — Z96641 Presence of right artificial hip joint: Secondary | ICD-10-CM

## 2023-08-24 DIAGNOSIS — M6281 Muscle weakness (generalized): Secondary | ICD-10-CM | POA: Diagnosis not present

## 2023-08-24 DIAGNOSIS — Z7409 Other reduced mobility: Secondary | ICD-10-CM

## 2023-08-24 NOTE — Therapy (Signed)
 OUTPATIENT PHYSICAL THERAPY LOWER EXTREMITY TREATMENT   Patient Name: Terri Douglas MRN: [id number] DOB:1946-02-20, 78 y.o., female Today's Date: 08/24/2023  END OF SESSION:  PT End of Session - 08/24/23 1017     Visit Number 12    Number of Visits 20    Date for PT Re-Evaluation 09/28/23    Authorization Type Medicare A & B; BCBS supplement    Authorization Time Period no auth no limit    Progress Note Due on Visit 20    PT Start Time 0930    PT Stop Time 1012    PT Time Calculation (min) 42 min    Activity Tolerance Patient tolerated treatment well    Behavior During Therapy WFL for tasks assessed/performed              Past Medical History:  Diagnosis Date   Arthritis    Complication of anesthesia    Gall stones    GERD (gastroesophageal reflux disease)    History of cardiac catheterization 2005   Minor elevation in cardiac enzymes however no significant obstructive CAD   Hyperlipidemia    Hypertension    Insomnia    PONV (postoperative nausea and vomiting)    Prediabetes    Past Surgical History:  Procedure Laterality Date   A-FLUTTER ABLATION N/A 08/01/2017   Procedure: A-FLUTTER ABLATION;  Surgeon: Hillis Range, MD;  Location: [facility];  Service: Cardiovascular;  Laterality: N/A;   ABDOMINAL HYSTERECTOMY     BACK SURGERY  1992   CARDIAC CATHETERIZATION     BACK IN 2004  SHE THINKS IT CAME BACK 'NORMAL'   CHOLECYSTECTOMY N/A 06/24/2015   Procedure: LAPAROSCOPIC CHOLECYSTECTOMY;  Surgeon: Rodman Pickle, MD;  Location: [facility] OR;  Service: General;  Laterality: N/A;   COLONOSCOPY N/A 08/30/2012   Procedure: COLONOSCOPY;  Surgeon: Malissa Hippo, MD;  Location: [facility];  Service: Endoscopy;  Laterality: N/A;  830   DILATION AND CURETTAGE OF UTERUS     ESOPHAGOGASTRODUODENOSCOPY  04/15/2011   Procedure: ESOPHAGOGASTRODUODENOSCOPY (EGD);  Surgeon: Malissa Hippo, MD;  Location: [facility];  Service: Endoscopy;  Laterality: N/A;  11:30    ESOPHAGOGASTRODUODENOSCOPY N/A 05/27/2016   Procedure: ESOPHAGOGASTRODUODENOSCOPY (EGD);  Surgeon: Malissa Hippo, MD;  Location: [facility];  Service: Endoscopy;  Laterality: N/A;  11:15   EYE SURGERY     cataract removal   NECK SURGERY  12/13/2018   REVERSE SHOULDER ARTHROPLASTY Left 04/03/2019   Procedure: REVERSE SHOULDER ARTHROPLASTY;  Surgeon: Bjorn Pippin, MD;  Location: [facility];  Service: Orthopedics;  Laterality: Left;   VAGINAL HYSTERECTOMY     Patient Active Problem List   Diagnosis Date Noted   Petechiae 08/08/2023   Chronic right-sided low back pain with right-sided sciatica 04/12/2023   History of atrial flutter 04/20/2022   Paroxysmal SVT (supraventricular tachycardia) (HCC) 04/20/2022   Laryngopharyngeal reflux (LPR) 09/28/2020   Primary osteoarthritis of right hip 01/17/2020   Pedal edema 11/19/2019   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   Insomnia 03/15/2019   Hyperlipidemia 06/18/2014   Gout 06/18/2014   Osteopenia 10/28/2013   Hypertension 03/29/2011    PCP: Babs Sciara, MD  REFERRING PROVIDER:   Teryl Lucy, MD  Next apt 08/16/23  REFERRING DIAG: R POST THR on 07/06/2023  THERAPY DIAG:  S/P total right hip arthroplasty  Impaired functional mobility, balance, gait, and endurance  Rationale for Evaluation and Treatment: Rehabilitation  ONSET DATE: 07/06/2023  SUBJECTIVE:  SUBJECTIVE STATEMENT: Pt continues to report 2/10 pain today. Follow up with Hip surgeon said things overall are going good, will take awhile.   Eval:  Lateral approach total hip arthroplasty on 07/06/2023. Pt voices concerns with husband ability to assist with caring for THA due to potential dementia. Wife voices concerns with safety concerns for hip. Pt reports she feels safe on a physical level and does not feel she will be abused but is concerned for how he treats/assists with level of care.   PERTINENT HISTORY: Reverse  TSA Osteopenia HTN PAIN:  Are you having pain? Yes: NPRS scale: 2-3/10 Pain location: Right hip Pain description: dull/achy Aggravating factors: fast movements Relieving factors: OTC medications, Iceman  PRECAUTIONS: Other: No ER/IR and straight leg.   RED FLAGS: None   WEIGHT BEARING RESTRICTIONS: No  FALLS:  Has patient fallen in last 6 months? No   PATIENT GOALS: "do more on my own and get comfortable doing"   OBJECTIVE:  Note: Objective measures were completed at Evaluation unless otherwise noted.  PATIENT SURVEYS:  LEFS 22/80 = 27.5% ABC Scale: 1070/1600= 66.9%  08/17/23: LEFS: 63/80= ABD Scale: 960/1600= 60%   COGNITION: Overall cognitive status: Within functional limits for tasks assessed     SENSATION: WFL  POSTURE: increased thoracic kyphosis  LOWER EXTREMITY ROM:  Active ROM Right eval Left eval  Hip flexion    Hip extension    Hip abduction    Hip adduction    Hip internal rotation    Hip external rotation    Knee flexion    Knee extension    Ankle dorsiflexion    Ankle plantarflexion    Ankle inversion    Ankle eversion     (Blank rows = not tested)  LOWER EXTREMITY MMT:  MMT Right eval Left eval  Hip flexion    Hip extension    Hip abduction    Hip adduction    Hip internal rotation    Hip external rotation    Knee flexion    Knee extension    Ankle dorsiflexion    Ankle plantarflexion    Ankle inversion    Ankle eversion     (Blank rows = not tested)   FUNCTIONAL TESTS:  30 seconds chair stand test:6x w/ 2 HHA on RW  2 minute walk test: 115 ft  08/17/23 Progress Note 30x STS: 15 x, no AD : 338 ft, no AD  GAIT: Distance walked: 166ft Assistive device utilized: Environmental consultant - 2 wheeled Level of assistance: Modified independence Comments: mild right R circumduction during swing phase, limited knee flexion in R lower extremity                                                                                                                                 TREATMENT DATE:  08/24/2023  -Sit/stands with GTB at knees and 2in box under LLE for R weight shift using tidal tank sphere  2 x 12 -4in step ups x25 with no UE support. Visual cue via mirror for proper knee alignment.  -6in step up without UE support verbal cues for proper knee alignment x 20 -Tandem stance on aeromat with bilateral crossbody reaching for cones 1 x 1' bilaterally with CGA for balance- intermittent LOB laterally -Tandem walking 61ft x 2 CGA---> Supervision -Mini squats on aeromat w/ CGA x 10 -Banded monster walks 44ft x 6 with GTB at knees- CGA- visual and verbal cues for increased lateral steps.  -L SLS: 12 seconds  R SLS: 2 seconds  Norms: 18-39  F: 43.5 seconds  M: 43.2 seconds 40-49  F: 40.4 seconds  M: 40.1 seconds 50-59  F: 36 seconds  M: 38.1 seconds 60-69  F: 25.1 seconds  M: 28.7 seconds 70-79  F: 11.3 seconds  M: 18.3 seconds  08/21/23: Rockerboard R/L then DF/PF each mirror feedback Squat front of chair with RTB around thigh cueing for equal weight bearing Split stance squat 10x Sidestep 2RT (1st legs straight, 2nd squat position) 6in power up with 1 HHA required 15x 5" 6in lateral step 15x Leg press 3Pl 2x 10 slow mechanics Forward lunge onto BOSU 15x with minimal HHA   08/17/23 NuStep x 5', level 5  Progress Note  -ABC Scale -LEFS -30 sec STS - Test  Stair mechanics: Re-educ for "up with the good, down with bad" for safety at this time, step to pattern, using L railing ascending. Trial with RLE leading, mild buckling in RLE Lateral step downs, 6", 2x8, tactile cueing on R hip for glute med engagement    PATIENT EDUCATION:  Education details: PT Evaluation, findings, prognosis, frequency, attendance policy, and HEP if given.  Person educated: Patient Education method: Medical illustrator Education comprehension: verbalized understanding  HOME EXERCISE PROGRAM: Access Code:  [email or web address] URL: [email or web address] Date: 07/10/2023 Prepared by: Starling Manns  Exercises - Seated Ankle Pumps  - 1 x daily - 7 x weekly - 2-3 sets - 20 reps - Seated Long Arc Quad  - 1 x daily - 7 x weekly - 2-3 sets - 20 reps - Supine Bridge  - 1 x daily - 7 x weekly - 2-3 sets - 10-12 reps - Supine Heel Slide  - 1 x daily - 7 x weekly - 2-3 sets - 20 reps - Supine Quad Set  - 1 x daily - 7 x weekly - 2-3 sets - 20 reps  Access Code: [email or web address] URL: [email or web address] Date: 07/18/2023 Prepared by: Becky Sax  Exercises - Supine Hip Adduction Isometric with Ball  - 2 x daily - 7 x weekly - 2 sets - 10 reps - 5" hold - Hooklying Isometric Hip Abduction with Belt  - 2 x daily - 7 x weekly - 2 sets - 10 reps - 5" hold Access Code: [email or web address] URL: [email or web address] Date: 07/25/2023 Prepared by: Starling Manns  Exercises - Clamshell  - 1 x daily - 7 x weekly - 3 sets - 10 reps - Supine Bridge with Resistance Band  - 1 x daily - 7 x weekly - 3 sets - 10 reps Access Code: [email or web address] URL: [email or web address] Date: 08/03/2023 Prepared by: Starling Manns  Exercises - Standing Tandem Balance with Counter Support  - 1 x daily - 7 x weekly - 3 sets - 10 reps - Single Leg Stance with Support  - 1 x daily - 7 x weekly - 3 sets - 10 reps - Standing Isometric Hip Abduction with Knee at 90  at Wall  - 1 x daily - 7 x weekly - 3 sets - 10 reps - 10 hold  Access Code: [email or web address] URL: [email or web address] Date: 08/17/2023 Prepared by: Fabiola Backer Powell-Butler  Exercises - Seated Ankle Pumps  - 1 x daily - 7 x weekly - 2-3 sets - 20 reps - Seated Long Arc Quad  - 1 x daily - 7 x weekly - 2-3 sets - 20 reps - Supine Bridge  - 1 x daily - 7 x weekly - 2-3 sets - 10-12 reps - Supine Heel Slide  - 1 x daily - 7 x weekly - 2-3 sets - 20 reps - Supine Quad Set  - 1 x daily - 7 x weekly - 2-3 sets - 20 reps - Supine Hip  Adduction Isometric with Ball  - 2 x daily - 7 x weekly - 2 sets - 10 reps - 5" hold - Hooklying Isometric Hip Abduction with Belt  - 2 x daily - 7 x weekly - 2 sets - 10 reps - 5" hold - Lateral Step Down  - 2 x daily - 7 x weekly - 2 sets - 10 reps  ASSESSMENT:  CLINICAL IMPRESSION: Based on last progress note, pt meeting all goals except for perceived balance confidence, we will extend skilled PT services for 2x / 4 weeks due to balance deficits, R LE weakness, and lack of confidence with stairs. Pt tolerating treatment well today. Progressed endurance and balance activities with more dynamic activities and tools for balance reaction training. More focus on dynamic balance, tolerating challenges with well with improvement in interventions with multiple trials. Included a new goal. . Pt will benefit from skilled Physical Therapy services to address deficits/limitations in order to improve functional and QOL.   Pt at 6 weeks post-op.  Session focus on proximal strengthening, hip mobility and progressed functional strengthening.  Added glut specific strengthening with min cueing for form and mechanics.  Pt continues to demonstrate weakness iwht hip flexion and abduction noted with increased UE support required with step up training and difficulty with balance activities.  NO reports of increased pain through session.       OBJECTIVE IMPAIRMENTS: Abnormal gait, decreased activity tolerance, decreased balance, decreased endurance, decreased mobility, difficulty walking, decreased ROM, decreased strength, postural dysfunction, and pain.   ACTIVITY LIMITATIONS: carrying, lifting, bending, sitting, standing, squatting, sleeping, stairs, transfers, bed mobility, toileting, dressing, and locomotion level  PARTICIPATION LIMITATIONS: shopping, community activity, occupation, and yard work  PERSONAL FACTORS: Age, Past/current experiences, and 3+ comorbidities: see above for details  are also affecting  patient's functional outcome.   REHAB POTENTIAL: Good  CLINICAL DECISION MAKING: Stable/uncomplicated  EVALUATION COMPLEXITY: Low   GOALS: Goals reviewed with patient? No  SHORT TERM GOALS: Target date: 09/14/2023  Pt will be independent with HEP in order to demonstrate participation in Physical Therapy POC.  Baseline:  Goal status: IN PROGRESS  2.  Pt will 2/10 pain during mobility in order to demonstrate improved pain with functional activities.  Baseline:  Goal status: MET  LONG TERM GOALS: Target date: 09/28/2023  Pt will improve 30 Second Chair Stand Test by >4 reps  in order to demonstrate improved functional strength to return to desired activities.  Baseline: See objective.  Goal status: MET  2.  Pt will improve 2 MWT by 265ft in order to demonstrate improved functional ambulatory capacity in community setting.  Baseline: See objective.  Goal status: MET  3.  Pt will improve LEFS score by  20 points in order to demonstrate improved outcomes with functional tasks and ADLs. Baseline: See objective.  Goal status: MET  4.  Pt will improve ABC scale by 20 points in order to demonstrated improved safety and balance during functional activities.. Baseline: See objective.  Goal status: IN PROGRESS  5. Pt will improve R SLS equal to L side to demonstrate improved balance and safety during ambulatory activities.   Baseline: See objective.   Goal Status: Initial    PLAN:  PT FREQUENCY: 2x/week  PT DURATION: 4 weeks  PLANNED INTERVENTIONS: 97164- PT Re-evaluation, 97110-Therapeutic exercises, 97530- Therapeutic activity, 97112- Neuromuscular re-education, 97535- Self Care, 09811- Manual therapy, [id number]- Gait training, 97014- Electrical stimulation (unattended), [id number]- Electrical stimulation (manual), [id number]- Traction (mechanical), Balance training, Stair training, Cryotherapy, and Moist heat  PLAN FOR NEXT SESSION: ROM, early BLE strengthening, no active ER/IR of R hip  currently, no hip flexion past 90, progress squatting and reaching activities.  Nelida Meuse PT, DPT [facility] [place] [phone] office  10:18 AM, 08/24/23

## 2023-09-05 ENCOUNTER — Ambulatory Visit (HOSPITAL_COMMUNITY): Attending: Orthopedic Surgery

## 2023-09-05 ENCOUNTER — Encounter (HOSPITAL_COMMUNITY): Payer: Self-pay

## 2023-09-05 DIAGNOSIS — Z7409 Other reduced mobility: Secondary | ICD-10-CM | POA: Diagnosis not present

## 2023-09-05 DIAGNOSIS — Z96641 Presence of right artificial hip joint: Secondary | ICD-10-CM | POA: Diagnosis not present

## 2023-09-05 DIAGNOSIS — M6281 Muscle weakness (generalized): Secondary | ICD-10-CM

## 2023-09-05 NOTE — Therapy (Addendum)
 OUTPATIENT PHYSICAL THERAPY LOWER EXTREMITY TREATMENT   Patient Name: Terri Douglas MRN: [id number] DOB:10-09-1945, 78 y.o., female Today's Date: 09/05/2023  END OF SESSION:  PT End of Session - 09/05/23 1246     Visit Number 13    Number of Visits 20    Date for PT Re-Evaluation 09/28/23    Authorization Type Medicare A & B; BCBS supplement    Authorization Time Period no auth no limit    Progress Note Due on Visit 20    PT Start Time 1147    PT Stop Time 1235    PT Time Calculation (min) 48 min    Activity Tolerance Patient tolerated treatment well    Behavior During Therapy WFL for tasks assessed/performed               Past Medical History:  Diagnosis Date   Arthritis    Complication of anesthesia    Gall stones    GERD (gastroesophageal reflux disease)    History of cardiac catheterization 2005   Minor elevation in cardiac enzymes however no significant obstructive CAD   Hyperlipidemia    Hypertension    Insomnia    PONV (postoperative nausea and vomiting)    Prediabetes    Past Surgical History:  Procedure Laterality Date   A-FLUTTER ABLATION N/A 08/01/2017   Procedure: A-FLUTTER ABLATION;  Surgeon: Hillis Range, MD;  Location: [facility];  Service: Cardiovascular;  Laterality: N/A;   ABDOMINAL HYSTERECTOMY     BACK SURGERY  1992   CARDIAC CATHETERIZATION     BACK IN 2004  SHE THINKS IT CAME BACK 'NORMAL'   CHOLECYSTECTOMY N/A 06/24/2015   Procedure: LAPAROSCOPIC CHOLECYSTECTOMY;  Surgeon: Rodman Pickle, MD;  Location: [facility];  Service: General;  Laterality: N/A;   COLONOSCOPY N/A 08/30/2012   Procedure: COLONOSCOPY;  Surgeon: Malissa Hippo, MD;  Location: [facility];  Service: Endoscopy;  Laterality: N/A;  830   DILATION AND CURETTAGE OF UTERUS     ESOPHAGOGASTRODUODENOSCOPY  04/15/2011   Procedure: ESOPHAGOGASTRODUODENOSCOPY (EGD);  Surgeon: Malissa Hippo, MD;  Location: [facility];  Service: Endoscopy;  Laterality: N/A;  11:30    ESOPHAGOGASTRODUODENOSCOPY N/A 05/27/2016   Procedure: ESOPHAGOGASTRODUODENOSCOPY (EGD);  Surgeon: Malissa Hippo, MD;  Location: [facility];  Service: Endoscopy;  Laterality: N/A;  11:15   EYE SURGERY     cataract removal   NECK SURGERY  12/13/2018   REVERSE SHOULDER ARTHROPLASTY Left 04/03/2019   Procedure: REVERSE SHOULDER ARTHROPLASTY;  Surgeon: Bjorn Pippin, MD;  Location: [facility];  Service: Orthopedics;  Laterality: Left;   VAGINAL HYSTERECTOMY     Patient Active Problem List   Diagnosis Date Noted   Asymptomatic superficial varicose vein of both lower extremities 08/24/2023   Petechiae 08/08/2023   Chronic right-sided low back pain with right-sided sciatica 04/12/2023   History of atrial flutter 04/20/2022   Paroxysmal SVT (supraventricular tachycardia) (HCC) 04/20/2022   Laryngopharyngeal reflux (LPR) 09/28/2020   Primary osteoarthritis of right hip 01/17/2020   Pedal edema 11/19/2019   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   Insomnia 03/15/2019   Hyperlipidemia 06/18/2014   Gout 06/18/2014   Osteopenia 10/28/2013   Hypertension 03/29/2011    PCP: Babs Sciara, MD  REFERRING PROVIDER:   Teryl Lucy, MD  Next apt 08/16/23  REFERRING DIAG: R POST THR on 07/06/2023  THERAPY DIAG:  S/P total right hip arthroplasty  Impaired functional mobility, balance, gait, and endurance  Muscle  weakness (generalized)  Rationale for Evaluation and Treatment: Rehabilitation  ONSET DATE: 07/06/2023  SUBJECTIVE:   SUBJECTIVE STATEMENT: Pt reports no pain today. [place] 14 stairs 5 times per day, did hold onto the rail. Pt also reports walking 7000 steps one day. No falls although pt states balance still impaired she feels.   Eval:  Lateral approach total hip arthroplasty on 07/06/2023. Pt voices concerns with husband ability to assist with caring for THA due to potential dementia. Wife voices  concerns with safety concerns for hip. Pt reports she feels safe on a physical level and does not feel she will be abused but is concerned for how he treats/assists with level of care.   PERTINENT HISTORY: Reverse TSA Osteopenia HTN PAIN:  Are you having pain? Yes: NPRS scale: 2-3/10 Pain location: Right hip Pain description: dull/achy Aggravating factors: fast movements Relieving factors: OTC medications, Iceman  PRECAUTIONS: Other: No ER/IR and straight leg.   RED FLAGS: None   WEIGHT BEARING RESTRICTIONS: No  FALLS:  Has patient fallen in last 6 months? No   PATIENT GOALS: "do more on my own and get comfortable doing"   OBJECTIVE:  Note: Objective measures were completed at Evaluation unless otherwise noted.  PATIENT SURVEYS:  LEFS 22/80 = 27.5% ABC Scale: 1070/1600= 66.9%  08/17/23: LEFS: 63/80= ABD Scale: 960/1600= 60%   COGNITION: Overall cognitive status: Within functional limits for tasks assessed     SENSATION: WFL  POSTURE: increased thoracic kyphosis  LOWER EXTREMITY ROM:  Active ROM Right eval Left eval  Hip flexion    Hip extension    Hip abduction    Hip adduction    Hip internal rotation    Hip external rotation    Knee flexion    Knee extension    Ankle dorsiflexion    Ankle plantarflexion    Ankle inversion    Ankle eversion     (Blank rows = not tested)  LOWER EXTREMITY MMT:  MMT Right eval Left eval  Hip flexion    Hip extension    Hip abduction    Hip adduction    Hip internal rotation    Hip external rotation    Knee flexion    Knee extension    Ankle dorsiflexion    Ankle plantarflexion    Ankle inversion    Ankle eversion     (Blank rows = not tested)   FUNCTIONAL TESTS:  30 seconds chair stand test:6x w/ 2 HHA on RW  2 minute walk test: 115 ft  08/17/23 Progress Note 30x STS: 15 x, no AD : 338 ft, no AD  GAIT: Distance walked: 160ft Assistive device utilized: Environmental consultant - 2 wheeled Level of  assistance: Modified independence Comments: mild right R circumduction during swing phase, limited knee flexion in R lower extremity  TREATMENT DATE: 09/05/2023  Manual Therapy:  Therapeutic Exercise: -Nustep, 6 min, 85spm, level 4 -Ankle weight 3 way hip, 3lbs, 2 sets of 10 reps Therapeutic Activity: -Sit/stands with GTB at knees and 2in box under LLE for R weight shift using tidal tank sphere 2 x 10, pt cued for decreased knee valgus -6in step up without UE support verbal cues for proper knee alignment x 20 -Ankle weight, 3 lbs, walking march, butt kicks, 30 feet per lap, 3 laps -8inch step over with 3lb AW, RLE controlled eccentric lower and lunge on to Bosu ball, 5 laps in // -Stair ambulation of four steps with no UE support, reciprocating pattern    08/24/2023  -Sit/stands with GTB at knees and 2in box under LLE for R weight shift using tidal tank sphere 2 x 12 -4in step ups x25 with no UE support. Visual cue via mirror for proper knee alignment.  -6in step up without UE support verbal cues for proper knee alignment x 20 -Tandem stance on aeromat with bilateral crossbody reaching for cones 1 x 1' bilaterally with CGA for balance- intermittent LOB laterally -Tandem walking 13ft x 2 CGA---> Supervision -Mini squats on aeromat w/ CGA x 10 -Banded monster walks 50ft x 6 with GTB at knees- CGA- visual and verbal cues for increased lateral steps.  -L SLS: 12 seconds  R SLS: 2 seconds  Norms: 18-39  F: 43.5 seconds  M: 43.2 seconds 40-49  F: 40.4 seconds  M: 40.1 seconds 50-59  F: 36 seconds  M: 38.1 seconds 60-69  F: 25.1 seconds  M: 28.7 seconds 70-79  F: 11.3 seconds  M: 18.3 seconds  08/21/23: Rockerboard R/L then DF/PF each mirror feedback Squat front of chair with RTB around thigh cueing for equal weight bearing Split stance squat  10x Sidestep 2RT (1st legs straight, 2nd squat position) 6in power up with 1 HHA required 15x 5" 6in lateral step 15x Leg press 3Pl 2x 10 slow mechanics Forward lunge onto BOSU 15x with minimal HHA   08/17/23 NuStep x 5', level 5  Progress Note  -ABC Scale -LEFS -30 sec STS - Test  Stair mechanics: Re-educ for "up with the good, down with bad" for safety at this time, step to pattern, using L railing ascending. Trial with RLE leading, mild buckling in RLE Lateral step downs, 6", 2x8, tactile cueing on R hip for glute med engagement    PATIENT EDUCATION:  Education details: PT Evaluation, findings, prognosis, frequency, attendance policy, and HEP if given.  Person educated: Patient Education method: Medical illustrator Education comprehension: verbalized understanding  HOME EXERCISE PROGRAM: Access Code: [email or web address] URL: [email or web address] Date: 07/10/2023 Prepared by: Starling Manns  Exercises - Seated Ankle Pumps  - 1 x daily - 7 x weekly - 2-3 sets - 20 reps - Seated Long Arc Quad  - 1 x daily - 7 x weekly - 2-3 sets - 20 reps - Supine Bridge  - 1 x daily - 7 x weekly - 2-3 sets - 10-12 reps - Supine Heel Slide  - 1 x daily - 7 x weekly - 2-3 sets - 20 reps - Supine Quad Set  - 1 x daily - 7 x weekly - 2-3 sets - 20 reps  Access Code: [email or web address] URL: [email or web address] Date: 07/18/2023 Prepared by: Becky Sax  Exercises - Supine Hip Adduction Isometric with Ball  - 2 x daily - 7 x weekly - 2 sets - 10 reps - 5" hold - Hooklying Isometric Hip  Abduction with Belt  - 2 x daily - 7 x weekly - 2 sets - 10 reps - 5" hold Access Code: [email or web address] URL: [email or web address] Date: 07/25/2023 Prepared by: Starling Manns  Exercises - Clamshell  - 1 x daily - 7 x weekly - 3 sets - 10 reps - Supine Bridge with Resistance Band  - 1 x daily - 7 x weekly - 3 sets - 10 reps Access Code: [email or web address] URL:  [email or web address] Date: 08/03/2023 Prepared by: Starling Manns  Exercises - Standing Tandem Balance with Counter Support  - 1 x daily - 7 x weekly - 3 sets - 10 reps - Single Leg Stance with Support  - 1 x daily - 7 x weekly - 3 sets - 10 reps - Standing Isometric Hip Abduction with Knee at 90 at Wall  - 1 x daily - 7 x weekly - 3 sets - 10 reps - 10 hold  Access Code: [email or web address] URL: [email or web address] Date: 08/17/2023 Prepared by: Fabiola Backer Powell-Butler  Exercises - Seated Ankle Pumps  - 1 x daily - 7 x weekly - 2-3 sets - 20 reps - Seated Long Arc Quad  - 1 x daily - 7 x weekly - 2-3 sets - 20 reps - Supine Bridge  - 1 x daily - 7 x weekly - 2-3 sets - 10-12 reps - Supine Heel Slide  - 1 x daily - 7 x weekly - 2-3 sets - 20 reps - Supine Quad Set  - 1 x daily - 7 x weekly - 2-3 sets - 20 reps - Supine Hip Adduction Isometric with Ball  - 2 x daily - 7 x weekly - 2 sets - 10 reps - 5" hold - Hooklying Isometric Hip Abduction with Belt  - 2 x daily - 7 x weekly - 2 sets - 10 reps - 5" hold - Lateral Step Down  - 2 x daily - 7 x weekly - 2 sets - 10 reps  ASSESSMENT:  CLINICAL IMPRESSION: Patient continues to demonstrate decreased LE strength and balance, although both are significantly improved. Patient also demonstrates improved ability to navigate stairs at the end of today's session with no UE support and reciprocating stepping pattern. Patient continues to show ability to progress exercise complexity and dynamic nature with today's activities with multiple steps and obstacles while having ankle weights on. Patient would continue to benefit from skilled physical therapy for increased endurance with ambulation, increased RLE strength, and balance for improved quality of life, improved independence with gait training and continued progress towards therapy goals.   Pt at 6 weeks post-op.  Session focus on proximal strengthening, hip mobility and progressed  functional strengthening.  Added glut specific strengthening with min cueing for form and mechanics.  Pt continues to demonstrate weakness iwht hip flexion and abduction noted with increased UE support required with step up training and difficulty with balance activities.  NO reports of increased pain through session.       OBJECTIVE IMPAIRMENTS: Abnormal gait, decreased activity tolerance, decreased balance, decreased endurance, decreased mobility, difficulty walking, decreased ROM, decreased strength, postural dysfunction, and pain.   ACTIVITY LIMITATIONS: carrying, lifting, bending, sitting, standing, squatting, sleeping, stairs, transfers, bed mobility, toileting, dressing, and locomotion level  PARTICIPATION LIMITATIONS: shopping, community activity, occupation, and yard work  PERSONAL FACTORS: Age, Past/current experiences, and 3+ comorbidities: see above for details  are also affecting patient's functional outcome.   REHAB POTENTIAL: Good  CLINICAL DECISION MAKING: Stable/uncomplicated  EVALUATION COMPLEXITY: Low   GOALS: Goals  reviewed with patient? No  SHORT TERM GOALS: Target date: 09/14/2023  Pt will be independent with HEP in order to demonstrate participation in Physical Therapy POC.  Baseline:  Goal status: IN PROGRESS  2.  Pt will 2/10 pain during mobility in order to demonstrate improved pain with functional activities.  Baseline:  Goal status: MET  LONG TERM GOALS: Target date: 09/28/2023  Pt will improve 30 Second Chair Stand Test by >4 reps  in order to demonstrate improved functional strength to return to desired activities.  Baseline: See objective.  Goal status: MET  2.  Pt will improve 2 MWT by 251ft in order to demonstrate improved functional ambulatory capacity in community setting.  Baseline: See objective.  Goal status: MET  3.  Pt will improve LEFS score by 20 points in order to demonstrate improved outcomes with functional tasks and  ADLs. Baseline: See objective.  Goal status: MET  4.  Pt will improve ABC scale by 20 points in order to demonstrated improved safety and balance during functional activities.. Baseline: See objective.  Goal status: IN PROGRESS  5. Pt will improve R SLS equal to L side to demonstrate improved balance and safety during ambulatory activities.   Baseline: See objective.   Goal Status: Initial    PLAN:  PT FREQUENCY: 2x/week  PT DURATION: 4 weeks  PLANNED INTERVENTIONS: 97164- PT Re-evaluation, 97110-Therapeutic exercises, 97530- Therapeutic activity, 97112- Neuromuscular re-education, 97535- Self Care, 40981- Manual therapy, [id number]- Gait training, 97014- Electrical stimulation (unattended), [id number]- Electrical stimulation (manual), [id number]- Traction (mechanical), Balance training, Stair training, Cryotherapy, and Moist heat  PLAN FOR NEXT SESSION: ROM, early BLE strengthening, no active ER/IR of R hip currently, no hip flexion past 90, progress squatting and reaching activities. Lifting training with increased squat.  Luz Lex, PT, DPT [facility] Office: [phone] 1:31 PM, 09/05/23

## 2023-09-07 ENCOUNTER — Ambulatory Visit (HOSPITAL_COMMUNITY)

## 2023-09-07 ENCOUNTER — Encounter (HOSPITAL_COMMUNITY): Payer: Self-pay

## 2023-09-07 DIAGNOSIS — Z7409 Other reduced mobility: Secondary | ICD-10-CM

## 2023-09-07 DIAGNOSIS — M6281 Muscle weakness (generalized): Secondary | ICD-10-CM

## 2023-09-07 DIAGNOSIS — Z96641 Presence of right artificial hip joint: Secondary | ICD-10-CM | POA: Diagnosis not present

## 2023-09-07 NOTE — Therapy (Signed)
 OUTPATIENT PHYSICAL THERAPY LOWER EXTREMITY TREATMENT   Patient Name: Terri Douglas MRN: [id number] DOB:04/11/1946, 78 y.o., female Today's Date: 09/07/2023  END OF SESSION:  PT End of Session - 09/07/23 1140     Visit Number 14    Number of Visits 20    Date for PT Re-Evaluation 09/28/23    Authorization Type Medicare A & B; BCBS supplement    Authorization Time Period no auth no limit    Progress Note Due on Visit 20    PT Start Time 1140    PT Stop Time 1223    PT Time Calculation (min) 43 min    Activity Tolerance Patient tolerated treatment well    Behavior During Therapy WFL for tasks assessed/performed               Past Medical History:  Diagnosis Date   Arthritis    Complication of anesthesia    Gall stones    GERD (gastroesophageal reflux disease)    History of cardiac catheterization 2005   Minor elevation in cardiac enzymes however no significant obstructive CAD   Hyperlipidemia    Hypertension    Insomnia    PONV (postoperative nausea and vomiting)    Prediabetes    Past Surgical History:  Procedure Laterality Date   A-FLUTTER ABLATION N/A 08/01/2017   Procedure: A-FLUTTER ABLATION;  Surgeon: Hillis Range, MD;  Location: [facility];  Service: Cardiovascular;  Laterality: N/A;   ABDOMINAL HYSTERECTOMY     BACK SURGERY  1992   CARDIAC CATHETERIZATION     BACK IN 2004  SHE THINKS IT CAME BACK 'NORMAL'   CHOLECYSTECTOMY N/A 06/24/2015   Procedure: LAPAROSCOPIC CHOLECYSTECTOMY;  Surgeon: Rodman Pickle, MD;  Location: [facility] OR;  Service: General;  Laterality: N/A;   COLONOSCOPY N/A 08/30/2012   Procedure: COLONOSCOPY;  Surgeon: Malissa Hippo, MD;  Location: [facility];  Service: Endoscopy;  Laterality: N/A;  830   DILATION AND CURETTAGE OF UTERUS     ESOPHAGOGASTRODUODENOSCOPY  04/15/2011   Procedure: ESOPHAGOGASTRODUODENOSCOPY (EGD);  Surgeon: Malissa Hippo, MD;  Location: [facility];  Service: Endoscopy;  Laterality: N/A;  11:30    ESOPHAGOGASTRODUODENOSCOPY N/A 05/27/2016   Procedure: ESOPHAGOGASTRODUODENOSCOPY (EGD);  Surgeon: Malissa Hippo, MD;  Location: [facility];  Service: Endoscopy;  Laterality: N/A;  11:15   EYE SURGERY     cataract removal   NECK SURGERY  12/13/2018   REVERSE SHOULDER ARTHROPLASTY Left 04/03/2019   Procedure: REVERSE SHOULDER ARTHROPLASTY;  Surgeon: Bjorn Pippin, MD;  Location: [facility];  Service: Orthopedics;  Laterality: Left;   VAGINAL HYSTERECTOMY     Patient Active Problem List   Diagnosis Date Noted   Asymptomatic superficial varicose vein of both lower extremities 08/24/2023   Petechiae 08/08/2023   Chronic right-sided low back pain with right-sided sciatica 04/12/2023   History of atrial flutter 04/20/2022   Paroxysmal SVT (supraventricular tachycardia) (HCC) 04/20/2022   Laryngopharyngeal reflux (LPR) 09/28/2020   Primary osteoarthritis of right hip 01/17/2020   Pedal edema 11/19/2019   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   Insomnia 03/15/2019   Hyperlipidemia 06/18/2014   Gout 06/18/2014   Osteopenia 10/28/2013   Hypertension 03/29/2011    PCP: Babs Sciara, MD  REFERRING PROVIDER:   Teryl Lucy, MD  Next apt 08/16/23  REFERRING DIAG: R POST THR on 07/06/2023  THERAPY DIAG:  S/P total right hip arthroplasty  Impaired functional mobility, balance, gait, and endurance  Muscle  weakness (generalized)  Rationale for Evaluation and Treatment: Rehabilitation  ONSET DATE: 07/06/2023  SUBJECTIVE:   SUBJECTIVE STATEMENT: Pt stated she can tell she's making improvements.  Stated most difficulty with balance.  Reports she went to beach last week, able to walk 40,000 steps in a week.  One day walking 7,000.  Pt returns to [place] next Wednesday.  Eval:  Lateral approach total hip arthroplasty on 07/06/2023. Pt voices concerns with husband ability to assist with caring for THA due to potential dementia. Wife voices concerns with safety concerns  for hip. Pt reports she feels safe on a physical level and does not feel she will be abused but is concerned for how he treats/assists with level of care.   PERTINENT HISTORY: Reverse TSA Osteopenia HTN PAIN:  Are you having pain? Yes: NPRS scale: 2-3/10 Pain location: Right hip Pain description: dull/achy Aggravating factors: fast movements Relieving factors: OTC medications, Iceman  PRECAUTIONS: Other: No ER/IR and straight leg.   RED FLAGS: None   WEIGHT BEARING RESTRICTIONS: No  FALLS:  Has patient fallen in last 6 months? No   PATIENT GOALS: "do more on my own and get comfortable doing"   OBJECTIVE:  Note: Objective measures were completed at Evaluation unless otherwise noted.  PATIENT SURVEYS:  LEFS 22/80 = 27.5% ABC Scale: 1070/1600= 66.9%  08/17/23: LEFS: 63/80= ABD Scale: 960/1600= 60%   COGNITION: Overall cognitive status: Within functional limits for tasks assessed     SENSATION: WFL  POSTURE: increased thoracic kyphosis  LOWER EXTREMITY ROM:  Active ROM Right eval Left eval  Hip flexion    Hip extension    Hip abduction    Hip adduction    Hip internal rotation    Hip external rotation    Knee flexion    Knee extension    Ankle dorsiflexion    Ankle plantarflexion    Ankle inversion    Ankle eversion     (Blank rows = not tested)  LOWER EXTREMITY MMT:  MMT Right eval Left eval  Hip flexion    Hip extension    Hip abduction    Hip adduction    Hip internal rotation    Hip external rotation    Knee flexion    Knee extension    Ankle dorsiflexion    Ankle plantarflexion    Ankle inversion    Ankle eversion     (Blank rows = not tested)   FUNCTIONAL TESTS:  30 seconds chair stand test:6x w/ 2 HHA on RW  2 minute walk test: 115 ft  08/17/23 Progress Note 30x STS: 15 x, no AD : 338 ft, no AD  GAIT: Distance walked: 170ft Assistive device utilized: Environmental consultant - 2 wheeled Level of assistance: Modified  independence Comments: mild right R circumduction during swing phase, limited knee flexion in R lower extremity  TREATMENT DATE: 09/07/23: Squat to heel raises holding tidal tank 2set x 10 reps 3"  Bodycraft walk out 3Pl 5RT Retro and sidestep Tandem gait 1RT Balance beam tandem gait 2RT SLS Lt 14" Rt 8" Vector stance 3x 5 Tandem stance with one foot on 6in step with ball forward punch then overhead 10-12 reps Obstacle course with 5 cones and 4 hurdles (6 and 12 in) with SLS on each cone for 5", around cone, then over hurdels.  09/05/2023  Manual Therapy:  Therapeutic Exercise: -Nustep, 6 min, 85spm, level 4 -Ankle weight 3 way hip, 3lbs, 2 sets of 10 reps Therapeutic Activity: -Sit/stands with GTB at knees and 2in box under LLE for R weight shift using tidal tank sphere 2 x 10, pt cued for decreased knee valgus -6in step up without UE support verbal cues for proper knee alignment x 20 -Ankle weight, 3 lbs, walking march, butt kicks, 30 feet per lap, 3 laps -8inch step over with 3lb AW, RLE controlled eccentric lower and lunge on to Bosu ball, 5 laps in // -Stair ambulation of four steps with no UE support, reciprocating pattern    08/24/2023  -Sit/stands with GTB at knees and 2in box under LLE for R weight shift using tidal tank sphere 2 x 12 -4in step ups x25 with no UE support. Visual cue via mirror for proper knee alignment.  -6in step up without UE support verbal cues for proper knee alignment x 20 -Tandem stance on aeromat with bilateral crossbody reaching for cones 1 x 1' bilaterally with CGA for balance- intermittent LOB laterally -Tandem walking 9ft x 2 CGA---> Supervision -Mini squats on aeromat w/ CGA x 10 -Banded monster walks 75ft x 6 with GTB at knees- CGA- visual and verbal cues for increased lateral steps.  -L SLS: 12 seconds  R SLS: 2  seconds  Norms: 18-39  F: 43.5 seconds  M: 43.2 seconds 40-49  F: 40.4 seconds  M: 40.1 seconds 50-59  F: 36 seconds  M: 38.1 seconds 60-69  F: 25.1 seconds  M: 28.7 seconds 70-79  F: 11.3 seconds  M: 18.3 seconds  08/21/23: Rockerboard R/L then DF/PF each mirror feedback Squat front of chair with RTB around thigh cueing for equal weight bearing Split stance squat 10x Sidestep 2RT (1st legs straight, 2nd squat position) 6in power up with 1 HHA required 15x 5" 6in lateral step 15x Leg press 3Pl 2x 10 slow mechanics Forward lunge onto BOSU 15x with minimal HHA   08/17/23 NuStep x 5', level 5  Progress Note  -ABC Scale -LEFS -30 sec STS - Test  Stair mechanics: Re-educ for "up with the good, down with bad" for safety at this time, step to pattern, using L railing ascending. Trial with RLE leading, mild buckling in RLE Lateral step downs, 6", 2x8, tactile cueing on R hip for glute med engagement    PATIENT EDUCATION:  Education details: PT Evaluation, findings, prognosis, frequency, attendance policy, and HEP if given.  Person educated: Patient Education method: Medical illustrator Education comprehension: verbalized understanding  HOME EXERCISE PROGRAM: Access Code: [email or web address] URL: [email or web address] Date: 07/10/2023 Prepared by: Starling Manns  Exercises - Seated Ankle Pumps  - 1 x daily - 7 x weekly - 2-3 sets - 20 reps - Seated Long Arc Quad  - 1 x daily - 7 x weekly - 2-3 sets - 20 reps - Supine Bridge  - 1 x daily - 7 x weekly - 2-3 sets - 10-12 reps -  Supine Heel Slide  - 1 x daily - 7 x weekly - 2-3 sets - 20 reps - Supine Quad Set  - 1 x daily - 7 x weekly - 2-3 sets - 20 reps  Access Code: [email or web address] URL: [email or web address] Date: 07/18/2023 Prepared by: Becky Sax  Exercises - Supine Hip Adduction Isometric with Ball  - 2 x daily - 7 x weekly - 2 sets - 10 reps - 5" hold - Hooklying  Isometric Hip Abduction with Belt  - 2 x daily - 7 x weekly - 2 sets - 10 reps - 5" hold Access Code: [email or web address] URL: [email or web address] Date: 07/25/2023 Prepared by: Starling Manns  Exercises - Clamshell  - 1 x daily - 7 x weekly - 3 sets - 10 reps - Supine Bridge with Resistance Band  - 1 x daily - 7 x weekly - 3 sets - 10 reps Access Code: [email or web address] URL: [email or web address] Date: 08/03/2023 Prepared by: Starling Manns  Exercises - Standing Tandem Balance with Counter Support  - 1 x daily - 7 x weekly - 3 sets - 10 reps - Single Leg Stance with Support  - 1 x daily - 7 x weekly - 3 sets - 10 reps - Standing Isometric Hip Abduction with Knee at 90 at Wall  - 1 x daily - 7 x weekly - 3 sets - 10 reps - 10 hold  Access Code: [email or web address] URL: [email or web address] Date: 08/17/2023 Prepared by: Fabiola Backer Powell-Butler  Exercises - Seated Ankle Pumps  - 1 x daily - 7 x weekly - 2-3 sets - 20 reps - Seated Long Arc Quad  - 1 x daily - 7 x weekly - 2-3 sets - 20 reps - Supine Bridge  - 1 x daily - 7 x weekly - 2-3 sets - 10-12 reps - Supine Heel Slide  - 1 x daily - 7 x weekly - 2-3 sets - 20 reps - Supine Quad Set  - 1 x daily - 7 x weekly - 2-3 sets - 20 reps - Supine Hip Adduction Isometric with Ball  - 2 x daily - 7 x weekly - 2 sets - 10 reps - 5" hold - Hooklying Isometric Hip Abduction with Belt  - 2 x daily - 7 x weekly - 2 sets - 10 reps - 5" hold - Lateral Step Down  - 2 x daily - 7 x weekly - 2 sets - 10 reps  ASSESSMENT:  CLINICAL IMPRESSION: Pt progressing well towards POC.  Session focus with functional strengthening and balance training.  Added proper lifting with min cueing to improve weight bearing and increased gluteal activation.  Progressed to dynamic surface with tandem gait.  Pt continues to be limited with SLS based activities, required intermittent HHA.  No reports of increased pain through session.    Pt at 6 weeks  post-op.  Session focus on proximal strengthening, hip mobility and progressed functional strengthening.  Added glut specific strengthening with min cueing for form and mechanics.  Pt continues to demonstrate weakness iwht hip flexion and abduction noted with increased UE support required with step up training and difficulty with balance activities.  NO reports of increased pain through session.       OBJECTIVE IMPAIRMENTS: Abnormal gait, decreased activity tolerance, decreased balance, decreased endurance, decreased mobility, difficulty walking, decreased ROM, decreased strength, postural dysfunction, and pain.   ACTIVITY LIMITATIONS: carrying, lifting, bending, sitting, standing, squatting, sleeping, stairs, transfers, bed mobility, toileting, dressing, and locomotion level  PARTICIPATION LIMITATIONS: shopping, community  activity, occupation, and yard work  PERSONAL FACTORS: Age, Past/current experiences, and 3+ comorbidities: see above for details  are also affecting patient's functional outcome.   REHAB POTENTIAL: Good  CLINICAL DECISION MAKING: Stable/uncomplicated  EVALUATION COMPLEXITY: Low   GOALS: Goals reviewed with patient? No  SHORT TERM GOALS: Target date: 09/14/2023  Pt will be independent with HEP in order to demonstrate participation in Physical Therapy POC.  Baseline:  Goal status: IN PROGRESS  2.  Pt will 2/10 pain during mobility in order to demonstrate improved pain with functional activities.  Baseline:  Goal status: MET  LONG TERM GOALS: Target date: 09/28/2023  Pt will improve 30 Second Chair Stand Test by >4 reps  in order to demonstrate improved functional strength to return to desired activities.  Baseline: See objective.  Goal status: MET  2.  Pt will improve 2 MWT by 248ft in order to demonstrate improved functional ambulatory capacity in community setting.  Baseline: See objective.  Goal status: MET  3.  Pt will improve LEFS score by 20 points in  order to demonstrate improved outcomes with functional tasks and ADLs. Baseline: See objective.  Goal status: MET  4.  Pt will improve ABC scale by 20 points in order to demonstrated improved safety and balance during functional activities.. Baseline: See objective.  Goal status: IN PROGRESS  5. Pt will improve R SLS equal to L side to demonstrate improved balance and safety during ambulatory activities.   Baseline: See objective.   Goal Status: Initial    PLAN:  PT FREQUENCY: 2x/week  PT DURATION: 4 weeks  PLANNED INTERVENTIONS: 97164- PT Re-evaluation, 97110-Therapeutic exercises, 97530- Therapeutic activity, 97112- Neuromuscular re-education, 97535- Self Care, 16109- Manual therapy, [id number]- Gait training, 97014- Electrical stimulation (unattended), [id number]- Electrical stimulation (manual), [id number]- Traction (mechanical), Balance training, Stair training, Cryotherapy, and Moist heat  PLAN FOR NEXT SESSION: ROM, early BLE strengthening, no active ER/IR of R hip currently, no hip flexion past 90, progress squatting and reaching activities. Lifting training with increased squat.  Becky Sax, LPTA/CLT; Rowe [place]ack [phone]  2:57 PM, 09/07/23

## 2023-09-11 ENCOUNTER — Ambulatory Visit (HOSPITAL_COMMUNITY)

## 2023-09-11 DIAGNOSIS — Z96641 Presence of right artificial hip joint: Secondary | ICD-10-CM

## 2023-09-11 DIAGNOSIS — Z7409 Other reduced mobility: Secondary | ICD-10-CM | POA: Diagnosis not present

## 2023-09-11 DIAGNOSIS — M6281 Muscle weakness (generalized): Secondary | ICD-10-CM

## 2023-09-11 NOTE — Therapy (Signed)
 OUTPATIENT PHYSICAL THERAPY LOWER EXTREMITY TREATMENT   Patient Name: Terri Douglas MRN: [id number] DOB:1946-01-04, 78 y.o., female Today's Date: 09/11/2023  END OF SESSION:  PT End of Session - 09/11/23 1257     Visit Number 15    Number of Visits 20    Date for PT Re-Evaluation 09/28/23    Authorization Type Medicare A & B; BCBS supplement    Progress Note Due on Visit 20    PT Start Time 1140    PT Stop Time 1225    PT Time Calculation (min) 45 min    Activity Tolerance Patient tolerated treatment well    Behavior During Therapy WFL for tasks assessed/performed                Past Medical History:  Diagnosis Date   Arthritis    Complication of anesthesia    Gall stones    GERD (gastroesophageal reflux disease)    History of cardiac catheterization 2005   Minor elevation in cardiac enzymes however no significant obstructive CAD   Hyperlipidemia    Hypertension    Insomnia    PONV (postoperative nausea and vomiting)    Prediabetes    Past Surgical History:  Procedure Laterality Date   A-FLUTTER ABLATION N/A 08/01/2017   Procedure: A-FLUTTER ABLATION;  Surgeon: Hillis Range, MD;  Location: [facility];  Service: Cardiovascular;  Laterality: N/A;   ABDOMINAL HYSTERECTOMY     BACK SURGERY  1992   CARDIAC CATHETERIZATION     BACK IN 2004  SHE THINKS IT CAME BACK 'NORMAL'   CHOLECYSTECTOMY N/A 06/24/2015   Procedure: LAPAROSCOPIC CHOLECYSTECTOMY;  Surgeon: Rodman Pickle, MD;  Location: [facility] OR;  Service: General;  Laterality: N/A;   COLONOSCOPY N/A 08/30/2012   Procedure: COLONOSCOPY;  Surgeon: Malissa Hippo, MD;  Location: [facility];  Service: Endoscopy;  Laterality: N/A;  830   DILATION AND CURETTAGE OF UTERUS     ESOPHAGOGASTRODUODENOSCOPY  04/15/2011   Procedure: ESOPHAGOGASTRODUODENOSCOPY (EGD);  Surgeon: Malissa Hippo, MD;  Location: [facility];  Service: Endoscopy;  Laterality: N/A;  11:30   ESOPHAGOGASTRODUODENOSCOPY N/A 05/27/2016    Procedure: ESOPHAGOGASTRODUODENOSCOPY (EGD);  Surgeon: Malissa Hippo, MD;  Location: [facility];  Service: Endoscopy;  Laterality: N/A;  11:15   EYE SURGERY     cataract removal   NECK SURGERY  12/13/2018   REVERSE SHOULDER ARTHROPLASTY Left 04/03/2019   Procedure: REVERSE SHOULDER ARTHROPLASTY;  Surgeon: Bjorn Pippin, MD;  Location: [facility];  Service: Orthopedics;  Laterality: Left;   VAGINAL HYSTERECTOMY     Patient Active Problem List   Diagnosis Date Noted   Asymptomatic superficial varicose vein of both lower extremities 08/24/2023   Petechiae 08/08/2023   Chronic right-sided low back pain with right-sided sciatica 04/12/2023   History of atrial flutter 04/20/2022   Paroxysmal SVT (supraventricular tachycardia) (HCC) 04/20/2022   Laryngopharyngeal reflux (LPR) 09/28/2020   Primary osteoarthritis of right hip 01/17/2020   Pedal edema 11/19/2019   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   Insomnia 03/15/2019   Hyperlipidemia 06/18/2014   Gout 06/18/2014   Osteopenia 10/28/2013   Hypertension 03/29/2011    PCP: Babs Sciara, MD  REFERRING PROVIDER:   Teryl Lucy, MD  Next apt 08/16/23  REFERRING DIAG: R POST THR on 07/06/2023  THERAPY DIAG:  S/P total right hip arthroplasty  Impaired functional mobility, balance, gait, and endurance  Muscle weakness (generalized)  Rationale for Evaluation and Treatment: Rehabilitation  ONSET DATE: 07/06/2023  SUBJECTIVE:   SUBJECTIVE STATEMENT: Pt reports she feels good and continuing to do her exercises. Still has most difficulty with balance.  Eval:  Lateral approach total hip arthroplasty on 07/06/2023. Pt voices concerns with husband ability to assist with caring for THA due to potential dementia. Wife voices concerns with safety concerns for hip. Pt reports she feels safe on a physical level and does not feel she will be abused but is concerned for how he treats/assists with level of care.    PERTINENT HISTORY: Reverse TSA Osteopenia HTN PAIN:  Are you having pain? Yes: NPRS scale: 2-3/10 Pain location: Right hip Pain description: dull/achy Aggravating factors: fast movements Relieving factors: OTC medications, Iceman  PRECAUTIONS: Other: No ER/IR and straight leg.   RED FLAGS: None   WEIGHT BEARING RESTRICTIONS: No  FALLS:  Has patient fallen in last 6 months? No   PATIENT GOALS: "do more on my own and get comfortable doing"   OBJECTIVE:  Note: Objective measures were completed at Evaluation unless otherwise noted.  PATIENT SURVEYS:  LEFS 22/80 = 27.5% ABC Scale: 1070/1600= 66.9%  08/17/23: LEFS: 63/80= ABD Scale: 960/1600= 60%   COGNITION: Overall cognitive status: Within functional limits for tasks assessed     SENSATION: WFL  POSTURE: increased thoracic kyphosis  LOWER EXTREMITY ROM:  Active ROM Right eval Left eval  Hip flexion    Hip extension    Hip abduction    Hip adduction    Hip internal rotation    Hip external rotation    Knee flexion    Knee extension    Ankle dorsiflexion    Ankle plantarflexion    Ankle inversion    Ankle eversion     (Blank rows = not tested)  LOWER EXTREMITY MMT:  MMT Right eval Left eval  Hip flexion    Hip extension    Hip abduction    Hip adduction    Hip internal rotation    Hip external rotation    Knee flexion    Knee extension    Ankle dorsiflexion    Ankle plantarflexion    Ankle inversion    Ankle eversion     (Blank rows = not tested)   FUNCTIONAL TESTS:  30 seconds chair stand test:6x w/ 2 HHA on RW  2 minute walk test: 115 ft  08/17/23 Progress Note 30x STS: 15 x, no AD : 338 ft, no AD   GAIT: Distance walked: 176ft Assistive device utilized: Environmental consultant - 2 wheeled Level of assistance: Modified independence Comments: mild right R circumduction during swing phase, limited knee flexion in R lower extremity                                                                                                                                 TREATMENT DATE: 09/11/23: Nu Step, 6 min, Level 4  Gastroc stretch 6 x 10s holds Weight shifts to BOSU  into lunge (5 w/ UE on // bars, 5 w/o UE A) Lateral lunge weight shifts to ball for improving balance reactions Sit to stand from chair w/ tidal tank 5 reps x 2 w/ mod-max cues for genu alignment Squats to chair w/ tap 10 reps w/ tidal tank w/ cues Step overs on BOSU ball (15 reps each LE w/ // bars)  SLS Lt" 20s Rt 8" Tandem stance R posterior 4 x 30s holds (addition of head turns for perturbations)   09/07/23: Squat to heel raises holding tidal tank 2set x 10 reps 3"  Bodycraft walk out 3Pl 5RT Retro and sidestep Tandem gait 1RT Balance beam tandem gait 2RT SLS Lt 14" Rt 8" Vector stance 3x 5 Tandem stance with one foot on 6in step with ball forward punch then overhead 10-12 reps Obstacle course with 5 cones and 4 hurdles (6 and 12 in) with SLS on each cone for 5", around cone, then over hurdels.  09/05/2023  Manual Therapy:  Therapeutic Exercise: -Nustep, 6 min, 85spm, level 4 -Ankle weight 3 way hip, 3lbs, 2 sets of 10 reps Therapeutic Activity: -Sit/stands with GTB at knees and 2in box under LLE for R weight shift using tidal tank sphere 2 x 10, pt cued for decreased knee valgus -6in step up without UE support verbal cues for proper knee alignment x 20 -Ankle weight, 3 lbs, walking march, butt kicks, 30 feet per lap, 3 laps -8inch step over with 3lb AW, RLE controlled eccentric lower and lunge on to Bosu ball, 5 laps in // -Stair ambulation of four steps with no UE support, reciprocating pattern    08/24/2023  -Sit/stands with GTB at knees and 2in box under LLE for R weight shift using tidal tank sphere 2 x 12 -4in step ups x25 with no UE support. Visual cue via mirror for proper knee alignment.  -6in step up without UE support verbal cues for proper knee alignment x 20 -Tandem stance on aeromat with  bilateral crossbody reaching for cones 1 x 1' bilaterally with CGA for balance- intermittent LOB laterally -Tandem walking 58ft x 2 CGA---> Supervision -Mini squats on aeromat w/ CGA x 10 -Banded monster walks 36ft x 6 with GTB at knees- CGA- visual and verbal cues for increased lateral steps.  -L SLS: 12 seconds  R SLS: 2 seconds  Norms: 18-39  F: 43.5 seconds  M: 43.2 seconds 40-49  F: 40.4 seconds  M: 40.1 seconds 50-59  F: 36 seconds  M: 38.1 seconds 60-69  F: 25.1 seconds  M: 28.7 seconds 70-79  F: 11.3 seconds  M: 18.3 seconds  08/21/23: Rockerboard R/L then DF/PF each mirror feedback Squat front of chair with RTB around thigh cueing for equal weight bearing Split stance squat 10x Sidestep 2RT (1st legs straight, 2nd squat position) 6in power up with 1 HHA required 15x 5" 6in lateral step 15x Leg press 3Pl 2x 10 slow mechanics Forward lunge onto BOSU 15x with minimal HHA   08/17/23 NuStep x 5', level 5  Progress Note  -ABC Scale -LEFS -30 sec STS - Test  Stair mechanics: Re-educ for "up with the good, down with bad" for safety at this time, step to pattern, using L railing ascending. Trial with RLE leading, mild buckling in RLE Lateral step downs, 6", 2x8, tactile cueing on R hip for glute med engagement    PATIENT EDUCATION:  Education details: PT Evaluation, findings, prognosis, frequency, attendance policy, and HEP if given.  Person educated: Patient  Education method: Medical illustrator Education comprehension: verbalized understanding  HOME EXERCISE PROGRAM: Access Code: [email or web address] URL: [email or web address] Date: 07/10/2023 Prepared by: Starling Manns  Exercises - Seated Ankle Pumps  - 1 x daily - 7 x weekly - 2-3 sets - 20 reps - Seated Long Arc Quad  - 1 x daily - 7 x weekly - 2-3 sets - 20 reps - Supine Bridge  - 1 x daily - 7 x weekly - 2-3 sets - 10-12 reps - Supine Heel Slide  - 1 x daily - 7 x weekly - 2-3  sets - 20 reps - Supine Quad Set  - 1 x daily - 7 x weekly - 2-3 sets - 20 reps  Access Code: [email or web address] URL: [email or web address] Date: 07/18/2023 Prepared by: Becky Sax  Exercises - Supine Hip Adduction Isometric with Ball  - 2 x daily - 7 x weekly - 2 sets - 10 reps - 5" hold - Hooklying Isometric Hip Abduction with Belt  - 2 x daily - 7 x weekly - 2 sets - 10 reps - 5" hold Access Code: [email or web address] URL: [email or web address] Date: 07/25/2023 Prepared by: Starling Manns  Exercises - Clamshell  - 1 x daily - 7 x weekly - 3 sets - 10 reps - Supine Bridge with Resistance Band  - 1 x daily - 7 x weekly - 3 sets - 10 reps Access Code: [email or web address] URL: [email or web address] Date: 08/03/2023 Prepared by: Starling Manns  Exercises - Standing Tandem Balance with Counter Support  - 1 x daily - 7 x weekly - 3 sets - 10 reps - Single Leg Stance with Support  - 1 x daily - 7 x weekly - 3 sets - 10 reps - Standing Isometric Hip Abduction with Knee at 90 at Wall  - 1 x daily - 7 x weekly - 3 sets - 10 reps - 10 hold  Access Code: [email or web address] URL: [email or web address] Date: 08/17/2023 Prepared by: Fabiola Backer Powell-Butler  Exercises - Seated Ankle Pumps  - 1 x daily - 7 x weekly - 2-3 sets - 20 reps - Seated Long Arc Quad  - 1 x daily - 7 x weekly - 2-3 sets - 20 reps - Supine Bridge  - 1 x daily - 7 x weekly - 2-3 sets - 10-12 reps - Supine Heel Slide  - 1 x daily - 7 x weekly - 2-3 sets - 20 reps - Supine Quad Set  - 1 x daily - 7 x weekly - 2-3 sets - 20 reps - Supine Hip Adduction Isometric with Ball  - 2 x daily - 7 x weekly - 2 sets - 10 reps - 5" hold - Hooklying Isometric Hip Abduction with Belt  - 2 x daily - 7 x weekly - 2 sets - 10 reps - 5" hold - Lateral Step Down  - 2 x daily - 7 x weekly - 2 sets - 10 reps  ASSESSMENT:  CLINICAL IMPRESSION: Pt progressing well towards POC.  Continued to address balance in dynamic  positions as well as change of direction and with weight shifting onto even and uneven surfaces for improving dynamic mm activation in hips and core. Session focus with functional strengthening and balance training. Continue to cue pt with proper glut activation during sit to stands/weighted squats. Pt continues to be limited with SLS based activities, required intermittent HHA.  No reports of increased pain through session.    Pt at 6 weeks post-op.  Session focus on proximal strengthening, hip mobility  and progressed functional strengthening.  Added glut specific strengthening with min cueing for form and mechanics.  Pt continues to demonstrate weakness iwht hip flexion and abduction noted with increased UE support required with step up training and difficulty with balance activities.  NO reports of increased pain through session.       OBJECTIVE IMPAIRMENTS: Abnormal gait, decreased activity tolerance, decreased balance, decreased endurance, decreased mobility, difficulty walking, decreased ROM, decreased strength, postural dysfunction, and pain.   ACTIVITY LIMITATIONS: carrying, lifting, bending, sitting, standing, squatting, sleeping, stairs, transfers, bed mobility, toileting, dressing, and locomotion level  PARTICIPATION LIMITATIONS: shopping, community activity, occupation, and yard work  PERSONAL FACTORS: Age, Past/current experiences, and 3+ comorbidities: see above for details  are also affecting patient's functional outcome.   REHAB POTENTIAL: Good  CLINICAL DECISION MAKING: Stable/uncomplicated  EVALUATION COMPLEXITY: Low   GOALS: Goals reviewed with patient? No  SHORT TERM GOALS: Target date: 09/14/2023  Pt will be independent with HEP in order to demonstrate participation in Physical Therapy POC.  Baseline:  Goal status: IN PROGRESS  2.  Pt will 2/10 pain during mobility in order to demonstrate improved pain with functional activities.  Baseline:  Goal status:  MET  LONG TERM GOALS: Target date: 09/28/2023  Pt will improve 30 Second Chair Stand Test by >4 reps  in order to demonstrate improved functional strength to return to desired activities.  Baseline: See objective.  Goal status: MET  2.  Pt will improve 2 MWT by 219ft in order to demonstrate improved functional ambulatory capacity in community setting.  Baseline: See objective.  Goal status: MET  3.  Pt will improve LEFS score by 20 points in order to demonstrate improved outcomes with functional tasks and ADLs. Baseline: See objective.  Goal status: MET  4.  Pt will improve ABC scale by 20 points in order to demonstrated improved safety and balance during functional activities.. Baseline: See objective.  Goal status: IN PROGRESS  5. Pt will improve R SLS equal to L side to demonstrate improved balance and safety during ambulatory activities.   Baseline: See objective.   Goal Status: Initial    PLAN:  PT FREQUENCY: 2x/week  PT DURATION: 4 weeks  PLANNED INTERVENTIONS: 97164- PT Re-evaluation, 97110-Therapeutic exercises, 97530- Therapeutic activity, 97112- Neuromuscular re-education, 97535- Self Care, 16109- Manual therapy, [id number]- Gait training, 97014- Electrical stimulation (unattended), [id number]- Electrical stimulation (manual), [id number]- Traction (mechanical), Balance training, Stair training, Cryotherapy, and Moist heat  PLAN FOR NEXT SESSION: progress squatting and reaching activities. Lifting training with increased squat.  Placido Sou PT, DPT [facility] [phone] office   3:28 PM, 09/11/23

## 2023-09-13 DIAGNOSIS — M1611 Unilateral primary osteoarthritis, right hip: Secondary | ICD-10-CM | POA: Diagnosis not present

## 2023-09-14 ENCOUNTER — Ambulatory Visit (HOSPITAL_COMMUNITY)

## 2023-09-14 ENCOUNTER — Encounter (HOSPITAL_COMMUNITY): Payer: Self-pay

## 2023-09-14 DIAGNOSIS — M6281 Muscle weakness (generalized): Secondary | ICD-10-CM

## 2023-09-14 DIAGNOSIS — Z7409 Other reduced mobility: Secondary | ICD-10-CM | POA: Diagnosis not present

## 2023-09-14 DIAGNOSIS — Z96641 Presence of right artificial hip joint: Secondary | ICD-10-CM

## 2023-09-14 NOTE — Therapy (Signed)
 OUTPATIENT PHYSICAL THERAPY LOWER EXTREMITY TREATMENT   Patient Name: Terri Douglas MRN: [id number] DOB:12-04-1945, 78 y.o., female Today's Date: 09/14/2023  END OF SESSION:  PT End of Session - 09/14/23 1104     Visit Number 16    Number of Visits 20    Date for PT Re-Evaluation 09/28/23    Authorization Type Medicare A & B; BCBS supplement    Authorization Time Period no auth no limit    Progress Note Due on Visit 20    PT Start Time 1058    PT Stop Time 1144    PT Time Calculation (min) 46 min    Activity Tolerance Patient tolerated treatment well    Behavior During Therapy WFL for tasks assessed/performed                 Past Medical History:  Diagnosis Date   Arthritis    Complication of anesthesia    Gall stones    GERD (gastroesophageal reflux disease)    History of cardiac catheterization 2005   Minor elevation in cardiac enzymes however no significant obstructive CAD   Hyperlipidemia    Hypertension    Insomnia    PONV (postoperative nausea and vomiting)    Prediabetes    Past Surgical History:  Procedure Laterality Date   A-FLUTTER ABLATION N/A 08/01/2017   Procedure: A-FLUTTER ABLATION;  Surgeon: Hillis Range, MD;  Location: [facility];  Service: Cardiovascular;  Laterality: N/A;   ABDOMINAL HYSTERECTOMY     BACK SURGERY  1992   CARDIAC CATHETERIZATION     BACK IN 2004  SHE THINKS IT CAME BACK 'NORMAL'   CHOLECYSTECTOMY N/A 06/24/2015   Procedure: LAPAROSCOPIC CHOLECYSTECTOMY;  Surgeon: Rodman Pickle, MD;  Location: [facility];  Service: General;  Laterality: N/A;   COLONOSCOPY N/A 08/30/2012   Procedure: COLONOSCOPY;  Surgeon: Malissa Hippo, MD;  Location: [facility];  Service: Endoscopy;  Laterality: N/A;  830   DILATION AND CURETTAGE OF UTERUS     ESOPHAGOGASTRODUODENOSCOPY  04/15/2011   Procedure: ESOPHAGOGASTRODUODENOSCOPY (EGD);  Surgeon: Malissa Hippo, MD;  Location: [facility];  Service: Endoscopy;  Laterality: N/A;  11:30    ESOPHAGOGASTRODUODENOSCOPY N/A 05/27/2016   Procedure: ESOPHAGOGASTRODUODENOSCOPY (EGD);  Surgeon: Malissa Hippo, MD;  Location: [facility];  Service: Endoscopy;  Laterality: N/A;  11:15   EYE SURGERY     cataract removal   NECK SURGERY  12/13/2018   REVERSE SHOULDER ARTHROPLASTY Left 04/03/2019   Procedure: REVERSE SHOULDER ARTHROPLASTY;  Surgeon: Bjorn Pippin, MD;  Location: [facility];  Service: Orthopedics;  Laterality: Left;   VAGINAL HYSTERECTOMY     Patient Active Problem List   Diagnosis Date Noted   Asymptomatic superficial varicose vein of both lower extremities 08/24/2023   Petechiae 08/08/2023   Chronic right-sided low back pain with right-sided sciatica 04/12/2023   History of atrial flutter 04/20/2022   Paroxysmal SVT (supraventricular tachycardia) (HCC) 04/20/2022   Laryngopharyngeal reflux (LPR) 09/28/2020   Primary osteoarthritis of right hip 01/17/2020   Pedal edema 11/19/2019   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   Insomnia 03/15/2019   Hyperlipidemia 06/18/2014   Gout 06/18/2014   Osteopenia 10/28/2013   Hypertension 03/29/2011    PCP: Babs Sciara, MD  REFERRING PROVIDER:   Teryl Lucy, MD  Next apt 08/16/23  REFERRING DIAG: R POST THR on 07/06/2023  THERAPY DIAG:  S/P total right hip arthroplasty  Impaired functional mobility, balance, gait, and endurance  Muscle weakness (generalized)  Rationale for Evaluation and Treatment: Rehabilitation  ONSET DATE: 07/06/2023  SUBJECTIVE:   SUBJECTIVE STATEMENT: 09/14/23:  Pt stated she is feeling good today, saw MD yesterday and stated he is impressed.  Returns to MD in 6 months.  Balance is most difficult.  Eval:  Lateral approach total hip arthroplasty on 07/06/2023. Pt voices concerns with husband ability to assist with caring for THA due to potential dementia. Wife voices concerns with safety concerns for hip. Pt reports she feels safe on a physical level and does not feel  she will be abused but is concerned for how he treats/assists with level of care.   PERTINENT HISTORY: Reverse TSA Osteopenia HTN PAIN:  Are you having pain? Yes: NPRS scale: 0/10 Pain location: Right hip Pain description: dull/achy Aggravating factors: fast movements Relieving factors: OTC medications, Iceman  PRECAUTIONS: Other: No ER/IR and straight leg.   RED FLAGS: None   WEIGHT BEARING RESTRICTIONS: No  FALLS:  Has patient fallen in last 6 months? No   PATIENT GOALS: "do more on my own and get comfortable doing"   OBJECTIVE:  Note: Objective measures were completed at Evaluation unless otherwise noted.  PATIENT SURVEYS:  LEFS 22/80 = 27.5% ABC Scale: 1070/1600= 66.9%  08/17/23: LEFS: 63/80= ABD Scale: 960/1600= 60%   COGNITION: Overall cognitive status: Within functional limits for tasks assessed     SENSATION: WFL  POSTURE: increased thoracic kyphosis  LOWER EXTREMITY ROM:  Active ROM Right eval Left eval  Hip flexion    Hip extension    Hip abduction    Hip adduction    Hip internal rotation    Hip external rotation    Knee flexion    Knee extension    Ankle dorsiflexion    Ankle plantarflexion    Ankle inversion    Ankle eversion     (Blank rows = not tested)  LOWER EXTREMITY MMT:  MMT Right eval Left eval  Hip flexion    Hip extension    Hip abduction    Hip adduction    Hip internal rotation    Hip external rotation    Knee flexion    Knee extension    Ankle dorsiflexion    Ankle plantarflexion    Ankle inversion    Ankle eversion     (Blank rows = not tested)   FUNCTIONAL TESTS:  30 seconds chair stand test:6x w/ 2 HHA on RW  2 minute walk test: 115 ft  08/17/23 Progress Note 30x STS: 15 x, no AD : 338 ft, no AD   GAIT: Distance walked: 149ft Assistive device utilized: Environmental consultant - 2 wheeled Level of assistance: Modified independence Comments: mild right R circumduction during swing phase, limited knee flexion  in R lower extremity  TREATMENT DATE: 09/13/23: Nustep [place] x L4 Squat to chain tap with RTB around thigh (to reduce valgus) 2x 10 Lateral lunges onto 6in step height 2x 10 Forward lunge on BOSU with minimal UE A 2x 10  Power up 5" holds on BOSU with 1 HHA 2x 10 Step over on BOSU with 1 HHA 2x 10 SLS Rt 9", Lt 26" Vector stance 5x 5" BLE no HHA Bodycraft 3Pl 3RT each retro and sidestep with SBA   09/11/23: Nu Step, 6 min, Level 4  Gastroc stretch 6 x 10s holds Weight shifts to BOSU into lunge (5 w/ UE on // bars, 5 w/o UE A) Lateral lunge weight shifts to ball for improving balance reactions Sit to stand from chair w/ tidal tank 5 reps x 2 w/ mod-max cues for genu alignment Squats to chair w/ tap 10 reps w/ tidal tank w/ cues Step overs on BOSU ball (15 reps each LE w/ // bars)  SLS Lt" 20s Rt 8" Tandem stance R posterior 4 x 30s holds (addition of head turns for perturbations)   09/07/23: Squat to heel raises holding tidal tank 2set x 10 reps 3"  Bodycraft walk out 3Pl 5RT Retro and sidestep Tandem gait 1RT Balance beam tandem gait 2RT SLS Lt 14" Rt 8" Vector stance 3x 5 Tandem stance with one foot on 6in step with ball forward punch then overhead 10-12 reps Obstacle course with 5 cones and 4 hurdles (6 and 12 in) with SLS on each cone for 5", around cone, then over hurdels.  09/05/2023  Manual Therapy:  Therapeutic Exercise: -Nustep, 6 min, 85spm, level 4 -Ankle weight 3 way hip, 3lbs, 2 sets of 10 reps Therapeutic Activity: -Sit/stands with GTB at knees and 2in box under LLE for R weight shift using tidal tank sphere 2 x 10, pt cued for decreased knee valgus -6in step up without UE support verbal cues for proper knee alignment x 20 -Ankle weight, 3 lbs, walking march, butt kicks, 30 feet per lap, 3 laps -8inch step over with 3lb AW,  RLE controlled eccentric lower and lunge on to Bosu ball, 5 laps in // -Stair ambulation of four steps with no UE support, reciprocating pattern    08/24/2023  -Sit/stands with GTB at knees and 2in box under LLE for R weight shift using tidal tank sphere 2 x 12 -4in step ups x25 with no UE support. Visual cue via mirror for proper knee alignment.  -6in step up without UE support verbal cues for proper knee alignment x 20 -Tandem stance on aeromat with bilateral crossbody reaching for cones 1 x 1' bilaterally with CGA for balance- intermittent LOB laterally -Tandem walking 18ft x 2 CGA---> Supervision -Mini squats on aeromat w/ CGA x 10 -Banded monster walks 38ft x 6 with GTB at knees- CGA- visual and verbal cues for increased lateral steps.  -L SLS: 12 seconds  R SLS: 2 seconds  Norms: 18-39  F: 43.5 seconds  M: 43.2 seconds 40-49  F: 40.4 seconds  M: 40.1 seconds 50-59  F: 36 seconds  M: 38.1 seconds 60-69  F: 25.1 seconds  M: 28.7 seconds 70-79  F: 11.3 seconds  M: 18.3 seconds  08/21/23: Rockerboard R/L then DF/PF each mirror feedback Squat front of chair with RTB around thigh cueing for equal weight bearing Split stance squat 10x Sidestep 2RT (1st legs straight, 2nd squat position) 6in power up with 1 HHA required 15x 5" 6in lateral step 15x Leg press  3Pl 2x 10 slow mechanics Forward lunge onto BOSU 15x with minimal HHA   08/17/23 NuStep x 5', level 5  Progress Note  -ABC Scale -LEFS -30 sec STS - Test  Stair mechanics: Re-educ for "up with the good, down with bad" for safety at this time, step to pattern, using L railing ascending. Trial with RLE leading, mild buckling in RLE Lateral step downs, 6", 2x8, tactile cueing on R hip for glute med engagement    PATIENT EDUCATION:  Education details: PT Evaluation, findings, prognosis, frequency, attendance policy, and HEP if given.  Person educated: Patient Education method: Software engineer Education comprehension: verbalized understanding  HOME EXERCISE PROGRAM: Access Code: [email or web address] URL: [email or web address] Date: 07/10/2023 Prepared by: Starling Manns  Exercises - Seated Ankle Pumps  - 1 x daily - 7 x weekly - 2-3 sets - 20 reps - Seated Long Arc Quad  - 1 x daily - 7 x weekly - 2-3 sets - 20 reps - Supine Bridge  - 1 x daily - 7 x weekly - 2-3 sets - 10-12 reps - Supine Heel Slide  - 1 x daily - 7 x weekly - 2-3 sets - 20 reps - Supine Quad Set  - 1 x daily - 7 x weekly - 2-3 sets - 20 reps  Access Code: [email or web address] URL: [email or web address] Date: 07/18/2023 Prepared by: Becky Sax  Exercises - Supine Hip Adduction Isometric with Ball  - 2 x daily - 7 x weekly - 2 sets - 10 reps - 5" hold - Hooklying Isometric Hip Abduction with Belt  - 2 x daily - 7 x weekly - 2 sets - 10 reps - 5" hold Access Code: [email or web address] URL: [email or web address] Date: 07/25/2023 Prepared by: Starling Manns  Exercises - Clamshell  - 1 x daily - 7 x weekly - 3 sets - 10 reps - Supine Bridge with Resistance Band  - 1 x daily - 7 x weekly - 3 sets - 10 reps Access Code: [email or web address] URL: [email or web address] Date: 08/03/2023 Prepared by: Starling Manns  Exercises - Standing Tandem Balance with Counter Support  - 1 x daily - 7 x weekly - 3 sets - 10 reps - Single Leg Stance with Support  - 1 x daily - 7 x weekly - 3 sets - 10 reps - Standing Isometric Hip Abduction with Knee at 90 at Wall  - 1 x daily - 7 x weekly - 3 sets - 10 reps - 10 hold  Access Code: [email or web address] URL: [email or web address] Date: 08/17/2023 Prepared by: Fabiola Backer Powell-Butler  Exercises - Seated Ankle Pumps  - 1 x daily - 7 x weekly - 2-3 sets - 20 reps - Seated Long Arc Quad  - 1 x daily - 7 x weekly - 2-3 sets - 20 reps - Supine Bridge  - 1 x daily - 7 x weekly - 2-3 sets - 10-12 reps - Supine Heel Slide  - 1 x daily - 7 x weekly  - 2-3 sets - 20 reps - Supine Quad Set  - 1 x daily - 7 x weekly - 2-3 sets - 20 reps - Supine Hip Adduction Isometric with Ball  - 2 x daily - 7 x weekly - 2 sets - 10 reps - 5" hold - Hooklying Isometric Hip Abduction with Belt  - 2 x daily - 7 x weekly - 2 sets - 10 reps - 5" hold - Lateral Step Down  - 2 x daily - 7 x weekly - 2 sets -  10 reps  ASSESSMENT:  CLINICAL IMPRESSION: Session focus with functional strengthening on dynamic surfaced to progress confidence with balance.  Pt required intermittent HHA with exercises on BOSU and some cueing for alignment.  Used theraband as proprioceptive cueing to improve knee alignment with sit to stands and squats to address knee valgus.  No reports of pain through session.  Pt at 6 weeks post-op.  Session focus on proximal strengthening, hip mobility and progressed functional strengthening.  Added glut specific strengthening with min cueing for form and mechanics.  Pt continues to demonstrate weakness iwht hip flexion and abduction noted with increased UE support required with step up training and difficulty with balance activities.  NO reports of increased pain through session.       OBJECTIVE IMPAIRMENTS: Abnormal gait, decreased activity tolerance, decreased balance, decreased endurance, decreased mobility, difficulty walking, decreased ROM, decreased strength, postural dysfunction, and pain.   ACTIVITY LIMITATIONS: carrying, lifting, bending, sitting, standing, squatting, sleeping, stairs, transfers, bed mobility, toileting, dressing, and locomotion level  PARTICIPATION LIMITATIONS: shopping, community activity, occupation, and yard work  PERSONAL FACTORS: Age, Past/current experiences, and 3+ comorbidities: see above for details  are also affecting patient's functional outcome.   REHAB POTENTIAL: Good  CLINICAL DECISION MAKING: Stable/uncomplicated  EVALUATION COMPLEXITY: Low   GOALS: Goals reviewed with patient? No  SHORT TERM  GOALS: Target date: 09/14/2023  Pt will be independent with HEP in order to demonstrate participation in Physical Therapy POC.  Baseline:  Goal status: IN PROGRESS  2.  Pt will 2/10 pain during mobility in order to demonstrate improved pain with functional activities.  Baseline:  Goal status: MET  LONG TERM GOALS: Target date: 09/28/2023  Pt will improve 30 Second Chair Stand Test by >4 reps  in order to demonstrate improved functional strength to return to desired activities.  Baseline: See objective.  Goal status: MET  2.  Pt will improve 2 MWT by 232ft in order to demonstrate improved functional ambulatory capacity in community setting.  Baseline: See objective.  Goal status: MET  3.  Pt will improve LEFS score by 20 points in order to demonstrate improved outcomes with functional tasks and ADLs. Baseline: See objective.  Goal status: MET  4.  Pt will improve ABC scale by 20 points in order to demonstrated improved safety and balance during functional activities.. Baseline: See objective.  Goal status: IN PROGRESS  5. Pt will improve R SLS equal to L side to demonstrate improved balance and safety during ambulatory activities.   Baseline: See objective.   Goal Status: Initial    PLAN:  PT FREQUENCY: 2x/week  PT DURATION: 4 weeks  PLANNED INTERVENTIONS: 97164- PT Re-evaluation, 97110-Therapeutic exercises, 97530- Therapeutic activity, 97112- Neuromuscular re-education, 97535- Self Care, 16109- Manual therapy, [id number]- Gait training, 97014- Electrical stimulation (unattended), [id number]- Electrical stimulation (manual), [id number]- Traction (mechanical), Balance training, Stair training, Cryotherapy, and Moist heat  PLAN FOR NEXT SESSION: progress squatting and reaching activities. Lifting training with increased squat.  Becky Sax, LPTA/CLT; CBIS [phone]  2:08 PM, 09/14/23

## 2023-09-19 ENCOUNTER — Telehealth: Payer: Self-pay

## 2023-09-19 ENCOUNTER — Other Ambulatory Visit: Payer: Self-pay

## 2023-09-19 ENCOUNTER — Ambulatory Visit (HOSPITAL_COMMUNITY)

## 2023-09-19 ENCOUNTER — Encounter (HOSPITAL_COMMUNITY): Payer: Self-pay

## 2023-09-19 DIAGNOSIS — Z96641 Presence of right artificial hip joint: Secondary | ICD-10-CM | POA: Diagnosis not present

## 2023-09-19 DIAGNOSIS — E7849 Other hyperlipidemia: Secondary | ICD-10-CM

## 2023-09-19 DIAGNOSIS — I10 Essential (primary) hypertension: Secondary | ICD-10-CM

## 2023-09-19 DIAGNOSIS — Z7409 Other reduced mobility: Secondary | ICD-10-CM

## 2023-09-19 DIAGNOSIS — M6281 Muscle weakness (generalized): Secondary | ICD-10-CM

## 2023-09-19 DIAGNOSIS — Z79899 Other long term (current) drug therapy: Secondary | ICD-10-CM

## 2023-09-19 DIAGNOSIS — M1000 Idiopathic gout, unspecified site: Secondary | ICD-10-CM

## 2023-09-19 NOTE — Telephone Encounter (Signed)
 Metabolic 7, lipid, liver, uric acid Gout, high risk medication, hyperlipidemia, HTN

## 2023-09-19 NOTE — Telephone Encounter (Signed)
 Spoke with the patient and informed per drs notes, labs have been ordered.

## 2023-09-19 NOTE — Therapy (Signed)
 OUTPATIENT PHYSICAL THERAPY LOWER EXTREMITY TREATMENT   Patient Name: Terri Douglas MRN: [id number] DOB:1945-12-12, 78 y.o., female Today's Date: 09/19/2023  END OF SESSION:  PT End of Session - 09/19/23 1302     Visit Number 17    Number of Visits 20    Date for PT Re-Evaluation 09/28/23    Authorization Type Medicare A & B; BCBS supplement    Authorization Time Period no auth no limit    PT Start Time 1145    PT Stop Time 1230    PT Time Calculation (min) 45 min    Equipment Utilized During Treatment Gait belt    Activity Tolerance Patient tolerated treatment well    Behavior During Therapy WFL for tasks assessed/performed                  Past Medical History:  Diagnosis Date   Arthritis    Complication of anesthesia    Gall stones    GERD (gastroesophageal reflux disease)    History of cardiac catheterization 2005   Minor elevation in cardiac enzymes however no significant obstructive CAD   Hyperlipidemia    Hypertension    Insomnia    PONV (postoperative nausea and vomiting)    Prediabetes    Past Surgical History:  Procedure Laterality Date   A-FLUTTER ABLATION N/A 08/01/2017   Procedure: A-FLUTTER ABLATION;  Surgeon: Hillis Range, MD;  Location: [facility];  Service: Cardiovascular;  Laterality: N/A;   ABDOMINAL HYSTERECTOMY     BACK SURGERY  1992   CARDIAC CATHETERIZATION     BACK IN 2004  SHE THINKS IT CAME BACK 'NORMAL'   CHOLECYSTECTOMY N/A 06/24/2015   Procedure: LAPAROSCOPIC CHOLECYSTECTOMY;  Surgeon: Rodman Pickle, MD;  Location: [facility];  Service: General;  Laterality: N/A;   COLONOSCOPY N/A 08/30/2012   Procedure: COLONOSCOPY;  Surgeon: Malissa Hippo, MD;  Location: [facility];  Service: Endoscopy;  Laterality: N/A;  830   DILATION AND CURETTAGE OF UTERUS     ESOPHAGOGASTRODUODENOSCOPY  04/15/2011   Procedure: ESOPHAGOGASTRODUODENOSCOPY (EGD);  Surgeon: Malissa Hippo, MD;  Location: [facility];  Service: Endoscopy;   Laterality: N/A;  11:30   ESOPHAGOGASTRODUODENOSCOPY N/A 05/27/2016   Procedure: ESOPHAGOGASTRODUODENOSCOPY (EGD);  Surgeon: Malissa Hippo, MD;  Location: [facility];  Service: Endoscopy;  Laterality: N/A;  11:15   EYE SURGERY     cataract removal   NECK SURGERY  12/13/2018   REVERSE SHOULDER ARTHROPLASTY Left 04/03/2019   Procedure: REVERSE SHOULDER ARTHROPLASTY;  Surgeon: Bjorn Pippin, MD;  Location: [facility];  Service: Orthopedics;  Laterality: Left;   VAGINAL HYSTERECTOMY     Patient Active Problem List   Diagnosis Date Noted   Asymptomatic superficial varicose vein of both lower extremities 08/24/2023   Petechiae 08/08/2023   Chronic right-sided low back pain with right-sided sciatica 04/12/2023   History of atrial flutter 04/20/2022   Paroxysmal SVT (supraventricular tachycardia) (HCC) 04/20/2022   Laryngopharyngeal reflux (LPR) 09/28/2020   Primary osteoarthritis of right hip 01/17/2020   Pedal edema 11/19/2019   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   Insomnia 03/15/2019   Hyperlipidemia 06/18/2014   Gout 06/18/2014   Osteopenia 10/28/2013   Hypertension 03/29/2011    PCP: Babs Sciara, MD  REFERRING PROVIDER:   Teryl Lucy, MD  Next apt 08/16/23  REFERRING DIAG: R POST THR on 07/06/2023  THERAPY DIAG:  S/P total right hip arthroplasty  Impaired functional mobility, balance, gait, and  endurance  Muscle weakness (generalized)  Rationale for Evaluation and Treatment: Rehabilitation  ONSET DATE: 07/06/2023  SUBJECTIVE:   SUBJECTIVE STATEMENT: Pt states she is doing better on steps but is still struggling with balance. Pt states she has a sore on her left posterior forearm that has spread, but pt states she has a upcoming dermatologist appointment early next month.  Eval:  Lateral approach total hip arthroplasty on 07/06/2023. Pt voices concerns with husband ability to assist with caring for THA due to potential dementia. Wife voices  concerns with safety concerns for hip. Pt reports she feels safe on a physical level and does not feel she will be abused but is concerned for how he treats/assists with level of care.   PERTINENT HISTORY: Reverse TSA Osteopenia HTN PAIN:  Are you having pain? Yes: NPRS scale: 0/10 Pain location: Right hip Pain description: dull/achy Aggravating factors: fast movements Relieving factors: OTC medications, Iceman  PRECAUTIONS: Other: No ER/IR and straight leg.   RED FLAGS: None   WEIGHT BEARING RESTRICTIONS: No  FALLS:  Has patient fallen in last 6 months? No   PATIENT GOALS: "do more on my own and get comfortable doing"   OBJECTIVE:  Note: Objective measures were completed at Evaluation unless otherwise noted.  PATIENT SURVEYS:  LEFS 22/80 = 27.5% ABC Scale: 1070/1600= 66.9%  08/17/23: LEFS: 63/80= ABD Scale: 960/1600= 60%   COGNITION: Overall cognitive status: Within functional limits for tasks assessed     SENSATION: WFL  POSTURE: increased thoracic kyphosis  LOWER EXTREMITY ROM:  Active ROM Right eval Left eval  Hip flexion    Hip extension    Hip abduction    Hip adduction    Hip internal rotation    Hip external rotation    Knee flexion    Knee extension    Ankle dorsiflexion    Ankle plantarflexion    Ankle inversion    Ankle eversion     (Blank rows = not tested)  LOWER EXTREMITY MMT:  MMT Right eval Left eval  Hip flexion    Hip extension    Hip abduction    Hip adduction    Hip internal rotation    Hip external rotation    Knee flexion    Knee extension    Ankle dorsiflexion    Ankle plantarflexion    Ankle inversion    Ankle eversion     (Blank rows = not tested)   FUNCTIONAL TESTS:  30 seconds chair stand test:6x w/ 2 HHA on RW  2 minute walk test: 115 ft  08/17/23 Progress Note 30x STS: 15 x, no AD : 338 ft, no AD   GAIT: Distance walked: 144ft Assistive device utilized: Environmental consultant - 2 wheeled Level of  assistance: Modified independence Comments: mild right R circumduction during swing phase, limited knee flexion in R lower extremity  TREATMENT DATE: 09/19/2023  Therapeutic Exercise: -3 minute nustep, level 5, 3 minutes -Walking marches/butt kicks with 3# ankle weights -Lateral stepping 4 laps in parallel bars, with RTB around ankles, pt cued for upright posture -Monster walking 3 laps in parallel bars, with RTB around ankles, pt cued for upright posture -Forward lunges, 1 set of 5 reps better performance going into RLE, pt cued for core activation and upright posture   Neuro- Reeducation: -Sit to stands>step up on RLE, 2 sets of 10 reps, pt cued for SLS on step for 3 seconds -SLS trampoline toss 1 set of 10 reps, bilaterally with red ball, RLE balance impaired much more than left -balance course, with aeromat tandem walking and lilly pads 4 laps, pt requires gait belt and CGA -balance course, with aeromat lateral stepping and lilly pads 2 laps, pt cued for controlled movements, pt requires gait belt and CGA  09/13/23: Nustep [place] x L4 Squat to chain tap with RTB around thigh (to reduce valgus) 2x 10 Lateral lunges onto 6in step height 2x 10 Forward lunge on BOSU with minimal UE A 2x 10  Power up 5" holds on BOSU with 1 HHA 2x 10 Step over on BOSU with 1 HHA 2x 10 SLS Rt 9", Lt 26" Vector stance 5x 5" BLE no HHA Bodycraft 3Pl 3RT each retro and sidestep with SBA   09/11/23: Nu Step, 6 min, Level 4  Gastroc stretch 6 x 10s holds Weight shifts to BOSU into lunge (5 w/ UE on // bars, 5 w/o UE A) Lateral lunge weight shifts to ball for improving balance reactions Sit to stand from chair w/ tidal tank 5 reps x 2 w/ mod-max cues for genu alignment Squats to chair w/ tap 10 reps w/ tidal tank w/ cues Step overs on BOSU ball (15 reps each LE w/ //  bars)  SLS Lt" 20s Rt 8" Tandem stance R posterior 4 x 30s holds (addition of head turns for perturbations)   09/07/23: Squat to heel raises holding tidal tank 2set x 10 reps 3"  Bodycraft walk out 3Pl 5RT Retro and sidestep Tandem gait 1RT Balance beam tandem gait 2RT SLS Lt 14" Rt 8" Vector stance 3x 5 Tandem stance with one foot on 6in step with ball forward punch then overhead 10-12 reps Obstacle course with 5 cones and 4 hurdles (6 and 12 in) with SLS on each cone for 5", around cone, then over hurdels.  09/05/2023  Manual Therapy:  Therapeutic Exercise: -Nustep, 6 min, 85spm, level 4 -Ankle weight 3 way hip, 3lbs, 2 sets of 10 reps Therapeutic Activity: -Sit/stands with GTB at knees and 2in box under LLE for R weight shift using tidal tank sphere 2 x 10, pt cued for decreased knee valgus -6in step up without UE support verbal cues for proper knee alignment x 20 -Ankle weight, 3 lbs, walking march, butt kicks, 30 feet per lap, 3 laps -8inch step over with 3lb AW, RLE controlled eccentric lower and lunge on to Bosu ball, 5 laps in // -Stair ambulation of four steps with no UE support, reciprocating pattern    08/24/2023  -Sit/stands with GTB at knees and 2in box under LLE for R weight shift using tidal tank sphere 2 x 12 -4in step ups x25 with no UE support. Visual cue via mirror for proper knee alignment.  -6in step up without UE support verbal cues for proper knee alignment x 20 -Tandem stance on aeromat with bilateral crossbody reaching for  cones 1 x 1' bilaterally with CGA for balance- intermittent LOB laterally -Tandem walking 48ft x 2 CGA---> Supervision -Mini squats on aeromat w/ CGA x 10 -Banded monster walks 32ft x 6 with GTB at knees- CGA- visual and verbal cues for increased lateral steps.  -L SLS: 12 seconds  R SLS: 2 seconds  Norms: 18-39  F: 43.5 seconds  M: 43.2 seconds 40-49  F: 40.4 seconds  M: 40.1 seconds 50-59  F: 36 seconds  M: 38.1  seconds 60-69  F: 25.1 seconds  M: 28.7 seconds 70-79  F: 11.3 seconds  M: 18.3 seconds  08/21/23: Rockerboard R/L then DF/PF each mirror feedback Squat front of chair with RTB around thigh cueing for equal weight bearing Split stance squat 10x Sidestep 2RT (1st legs straight, 2nd squat position) 6in power up with 1 HHA required 15x 5" 6in lateral step 15x Leg press 3Pl 2x 10 slow mechanics Forward lunge onto BOSU 15x with minimal HHA   08/17/23 NuStep x 5', level 5  Progress Note  -ABC Scale -LEFS -30 sec STS - Test  Stair mechanics: Re-educ for "up with the good, down with bad" for safety at this time, step to pattern, using L railing ascending. Trial with RLE leading, mild buckling in RLE Lateral step downs, 6", 2x8, tactile cueing on R hip for glute med engagement    PATIENT EDUCATION:  Education details: PT Evaluation, findings, prognosis, frequency, attendance policy, and HEP if given.  Person educated: Patient Education method: Medical illustrator Education comprehension: verbalized understanding  HOME EXERCISE PROGRAM: Access Code: [email or web address] URL: [email or web address] Date: 07/10/2023 Prepared by: Irene Mannheim  Exercises - Seated Ankle Pumps  - 1 x daily - 7 x weekly - 2-3 sets - 20 reps - Seated Long Arc Quad  - 1 x daily - 7 x weekly - 2-3 sets - 20 reps - Supine Bridge  - 1 x daily - 7 x weekly - 2-3 sets - 10-12 reps - Supine Heel Slide  - 1 x daily - 7 x weekly - 2-3 sets - 20 reps - Supine Quad Set  - 1 x daily - 7 x weekly - 2-3 sets - 20 reps  Access Code: [email or web address] URL: [email or web address] Date: 07/18/2023 Prepared by: Minor Amble  Exercises - Supine Hip Adduction Isometric with Ball  - 2 x daily - 7 x weekly - 2 sets - 10 reps - 5" hold - Hooklying Isometric Hip Abduction with Belt  - 2 x daily - 7 x weekly - 2 sets - 10 reps - 5" hold Access Code: [email or web address] URL:  [email or web address] Date: 07/25/2023 Prepared by: Irene Mannheim  Exercises - Clamshell  - 1 x daily - 7 x weekly - 3 sets - 10 reps - Supine Bridge with Resistance Band  - 1 x daily - 7 x weekly - 3 sets - 10 reps Access Code: [email or web address] URL: [email or web address] Date: 08/03/2023 Prepared by: Irene Mannheim  Exercises - Standing Tandem Balance with Counter Support  - 1 x daily - 7 x weekly - 3 sets - 10 reps - Single Leg Stance with Support  - 1 x daily - 7 x weekly - 3 sets - 10 reps - Standing Isometric Hip Abduction with Knee at 90 at Wall  - 1 x daily - 7 x weekly - 3 sets - 10 reps - 10 hold  Access Code: [email or web address] URL: [email or web address] Date: 08/17/2023 Prepared by: Virgia Griffins Powell-Butler  Exercises - Seated Ankle Pumps  - 1 x  daily - 7 x weekly - 2-3 sets - 20 reps - Seated Long Arc Quad  - 1 x daily - 7 x weekly - 2-3 sets - 20 reps - Supine Bridge  - 1 x daily - 7 x weekly - 2-3 sets - 10-12 reps - Supine Heel Slide  - 1 x daily - 7 x weekly - 2-3 sets - 20 reps - Supine Quad Set  - 1 x daily - 7 x weekly - 2-3 sets - 20 reps - Supine Hip Adduction Isometric with Ball  - 2 x daily - 7 x weekly - 2 sets - 10 reps - 5" hold - Hooklying Isometric Hip Abduction with Belt  - 2 x daily - 7 x weekly - 2 sets - 10 reps - 5" hold - Lateral Step Down  - 2 x daily - 7 x weekly - 2 sets - 10 reps  ASSESSMENT:  CLINICAL IMPRESSION: Patient continues to demonstrate decreased LE strength, decreased mobility and balance. Patient also demonstrates increased tolerance to aerobic exercise with increased intensity with aerobic based exercise during today's session. Patient able to progress dynamic balance and core activation exercises today with balance re-education and SLS activities. Patient would continue to benefit from skilled physical therapy for increased endurance with ambulation, increased LE strength, and improved balance for improved  quality of life, improved independence with completion of ADLs and continued progress towards therapy goals.  Pt at 6 weeks post-op.  Session focus on proximal strengthening, hip mobility and progressed functional strengthening.  Added glut specific strengthening with min cueing for form and mechanics.  Pt continues to demonstrate weakness iwht hip flexion and abduction noted with increased UE support required with step up training and difficulty with balance activities.  NO reports of increased pain through session.       OBJECTIVE IMPAIRMENTS: Abnormal gait, decreased activity tolerance, decreased balance, decreased endurance, decreased mobility, difficulty walking, decreased ROM, decreased strength, postural dysfunction, and pain.   ACTIVITY LIMITATIONS: carrying, lifting, bending, sitting, standing, squatting, sleeping, stairs, transfers, bed mobility, toileting, dressing, and locomotion level  PARTICIPATION LIMITATIONS: shopping, community activity, occupation, and yard work  PERSONAL FACTORS: Age, Past/current experiences, and 3+ comorbidities: see above for details  are also affecting patient's functional outcome.   REHAB POTENTIAL: Good  CLINICAL DECISION MAKING: Stable/uncomplicated  EVALUATION COMPLEXITY: Low   GOALS: Goals reviewed with patient? No  SHORT TERM GOALS: Target date: 09/14/2023  Pt will be independent with HEP in order to demonstrate participation in Physical Therapy POC.  Baseline:  Goal status: IN PROGRESS  2.  Pt will 2/10 pain during mobility in order to demonstrate improved pain with functional activities.  Baseline:  Goal status: MET  LONG TERM GOALS: Target date: 09/28/2023  Pt will improve 30 Second Chair Stand Test by >4 reps  in order to demonstrate improved functional strength to return to desired activities.  Baseline: See objective.  Goal status: MET  2.  Pt will improve 2 MWT by 271ft in order to demonstrate improved functional ambulatory  capacity in community setting.  Baseline: See objective.  Goal status: MET  3.  Pt will improve LEFS score by 20 points in order to demonstrate improved outcomes with functional tasks and ADLs. Baseline: See objective.  Goal status: MET  4.  Pt will improve ABC scale by 20 points in order to demonstrated improved safety and balance during functional activities.. Baseline: See objective.  Goal status: IN PROGRESS  5. Pt will improve R SLS  equal to L side to demonstrate improved balance and safety during ambulatory activities.   Baseline: See objective.   Goal Status: Initial    PLAN:  PT FREQUENCY: 2x/week  PT DURATION: 4 weeks  PLANNED INTERVENTIONS: 97164- PT Re-evaluation, 97110-Therapeutic exercises, 97530- Therapeutic activity, 97112- Neuromuscular re-education, 97535- Self Care, 16109- Manual therapy, [id number]- Gait training, 97014- Electrical stimulation (unattended), [id number]- Electrical stimulation (manual), [id number]- Traction (mechanical), Balance training, Stair training, Cryotherapy, and Moist heat  PLAN FOR NEXT SESSION: progress squatting and reaching activities. Lifting training with increased squat. Progress RLE strengthening  Armond Bertin, PT, DPT [facility] Office: [phone] 1:08 PM, 09/19/23

## 2023-09-19 NOTE — Telephone Encounter (Signed)
 Needs blood work ordered for Monday appt

## 2023-09-20 DIAGNOSIS — Z79899 Other long term (current) drug therapy: Secondary | ICD-10-CM | POA: Diagnosis not present

## 2023-09-20 DIAGNOSIS — E7849 Other hyperlipidemia: Secondary | ICD-10-CM | POA: Diagnosis not present

## 2023-09-20 DIAGNOSIS — I10 Essential (primary) hypertension: Secondary | ICD-10-CM | POA: Diagnosis not present

## 2023-09-20 DIAGNOSIS — M1000 Idiopathic gout, unspecified site: Secondary | ICD-10-CM | POA: Diagnosis not present

## 2023-09-21 ENCOUNTER — Encounter (HOSPITAL_COMMUNITY): Payer: Self-pay

## 2023-09-21 ENCOUNTER — Encounter: Payer: Self-pay | Admitting: Family Medicine

## 2023-09-21 ENCOUNTER — Ambulatory Visit (HOSPITAL_COMMUNITY)

## 2023-09-21 DIAGNOSIS — Z96641 Presence of right artificial hip joint: Secondary | ICD-10-CM

## 2023-09-21 DIAGNOSIS — Z7409 Other reduced mobility: Secondary | ICD-10-CM

## 2023-09-21 DIAGNOSIS — M6281 Muscle weakness (generalized): Secondary | ICD-10-CM | POA: Diagnosis not present

## 2023-09-21 LAB — HEPATIC FUNCTION PANEL
ALT: 18 IU/L (ref 0–32)
AST: 22 IU/L (ref 0–40)
Albumin: 4.2 g/dL (ref 3.8–4.8)
Alkaline Phosphatase: 91 IU/L (ref 44–121)
Bilirubin Total: 0.3 mg/dL (ref 0.0–1.2)
Bilirubin, Direct: 0.13 mg/dL (ref 0.00–0.40)
Total Protein: 6 g/dL (ref 6.0–8.5)

## 2023-09-21 LAB — BASIC METABOLIC PANEL WITH GFR
BUN/Creatinine Ratio: 17 (ref 12–28)
BUN: 22 mg/dL (ref 8–27)
CO2: 27 mmol/L (ref 20–29)
Calcium: 9.1 mg/dL (ref 8.7–10.3)
Chloride: 104 mmol/L (ref 96–106)
Creatinine, Ser: 1.28 mg/dL — ABNORMAL HIGH (ref 0.57–1.00)
Glucose: 89 mg/dL (ref 70–99)
Potassium: 4.8 mmol/L (ref 3.5–5.2)
Sodium: 143 mmol/L (ref 134–144)
eGFR: 43 mL/min/{1.73_m2} — ABNORMAL LOW (ref >59)

## 2023-09-21 LAB — LIPID PANEL
Chol/HDL Ratio: 2.5 ratio (ref 0.0–4.4)
Cholesterol, Total: 134 mg/dL (ref 100–199)
HDL: 53 mg/dL (ref >39)
LDL Chol Calc (NIH): 61 mg/dL (ref 0–99)
Triglycerides: 113 mg/dL (ref 0–149)
VLDL Cholesterol Cal: 20 mg/dL (ref 5–40)

## 2023-09-21 LAB — URIC ACID: Uric Acid: 3.2 mg/dL (ref 3.1–7.9)

## 2023-09-21 NOTE — Therapy (Signed)
 OUTPATIENT PHYSICAL THERAPY LOWER EXTREMITY TREATMENT   Patient Name: Terri Douglas MRN: [id number] DOB:07-28-1945, 78 y.o., female Today's Date: 09/21/2023  END OF SESSION:  PT End of Session - 09/21/23 1139     Visit Number 18    Number of Visits 20    Date for PT Re-Evaluation 09/28/23    Authorization Type Medicare A & B; BCBS supplement    Authorization Time Period no auth no limit    Equipment Utilized During Treatment Gait belt    Activity Tolerance Patient tolerated treatment well    Behavior During Therapy WFL for tasks assessed/performed              Past Medical History:  Diagnosis Date   Arthritis    Complication of anesthesia    Gall stones    GERD (gastroesophageal reflux disease)    History of cardiac catheterization 2005   Minor elevation in cardiac enzymes however no significant obstructive CAD   Hyperlipidemia    Hypertension    Insomnia    PONV (postoperative nausea and vomiting)    Prediabetes    Past Surgical History:  Procedure Laterality Date   A-FLUTTER ABLATION N/A 08/01/2017   Procedure: A-FLUTTER ABLATION;  Surgeon: Jolly Needle, MD;  Location: [facility];  Service: Cardiovascular;  Laterality: N/A;   ABDOMINAL HYSTERECTOMY     BACK SURGERY  1992   CARDIAC CATHETERIZATION     BACK IN 2004  SHE THINKS IT CAME BACK 'NORMAL'   CHOLECYSTECTOMY N/A 06/24/2015   Procedure: LAPAROSCOPIC CHOLECYSTECTOMY;  Surgeon: Derral Flick, MD;  Location: [facility];  Service: General;  Laterality: N/A;   COLONOSCOPY N/A 08/30/2012   Procedure: COLONOSCOPY;  Surgeon: Ruby Corporal, MD;  Location: [facility];  Service: Endoscopy;  Laterality: N/A;  830   DILATION AND CURETTAGE OF UTERUS     ESOPHAGOGASTRODUODENOSCOPY  04/15/2011   Procedure: ESOPHAGOGASTRODUODENOSCOPY (EGD);  Surgeon: Ruby Corporal, MD;  Location: [facility];  Service: Endoscopy;  Laterality: N/A;  11:30   ESOPHAGOGASTRODUODENOSCOPY N/A 05/27/2016   Procedure:  ESOPHAGOGASTRODUODENOSCOPY (EGD);  Surgeon: Ruby Corporal, MD;  Location: [facility];  Service: Endoscopy;  Laterality: N/A;  11:15   EYE SURGERY     cataract removal   NECK SURGERY  12/13/2018   REVERSE SHOULDER ARTHROPLASTY Left 04/03/2019   Procedure: REVERSE SHOULDER ARTHROPLASTY;  Surgeon: Micheline Ahr, MD;  Location: [facility];  Service: Orthopedics;  Laterality: Left;   VAGINAL HYSTERECTOMY     Patient Active Problem List   Diagnosis Date Noted   Asymptomatic superficial varicose vein of both lower extremities 08/24/2023   Petechiae 08/08/2023   Chronic right-sided low back pain with right-sided sciatica 04/12/2023   History of atrial flutter 04/20/2022   Paroxysmal SVT (supraventricular tachycardia) (HCC) 04/20/2022   Laryngopharyngeal reflux (LPR) 09/28/2020   Primary osteoarthritis of right hip 01/17/2020   Pedal edema 11/19/2019   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   Insomnia 03/15/2019   Hyperlipidemia 06/18/2014   Gout 06/18/2014   Osteopenia 10/28/2013   Hypertension 03/29/2011    PCP: Bennet Brasil, MD  REFERRING PROVIDER:   Osa Blase, MD  Next apt 08/16/23  REFERRING DIAG: R POST THR on 07/06/2023  THERAPY DIAG:  S/P total right hip arthroplasty  Impaired functional mobility, balance, gait, and endurance  Muscle weakness (generalized)  Rationale for Evaluation and Treatment: Rehabilitation  ONSET DATE: 07/06/2023  SUBJECTIVE:   SUBJECTIVE STATEMENT: Pt reports she continues to  note balance issues.  Eval:  Lateral approach total hip arthroplasty on 07/06/2023. Pt voices concerns with husband ability to assist with caring for THA due to potential dementia. Wife voices concerns with safety concerns for hip. Pt reports she feels safe on a physical level and does not feel she will be abused but is concerned for how he treats/assists with level of care.   PERTINENT HISTORY: Reverse TSA Osteopenia HTN PAIN:  Are you  having pain? Yes: NPRS scale: 0/10 Pain location: Right hip Pain description: dull/achy Aggravating factors: fast movements Relieving factors: OTC medications, Iceman  PRECAUTIONS: Other: No ER/IR and straight leg.   RED FLAGS: None   WEIGHT BEARING RESTRICTIONS: No  FALLS:  Has patient fallen in last 6 months? No   PATIENT GOALS: "do more on my own and get comfortable doing"   OBJECTIVE:  Note: Objective measures were completed at Evaluation unless otherwise noted.  PATIENT SURVEYS:  LEFS 22/80 = 27.5% ABC Scale: 1070/1600= 66.9%  08/17/23: LEFS: 63/80= ABD Scale: 960/1600= 60%   COGNITION: Overall cognitive status: Within functional limits for tasks assessed     SENSATION: WFL  POSTURE: increased thoracic kyphosis  LOWER EXTREMITY ROM:  Active ROM Right eval Left eval  Hip flexion    Hip extension    Hip abduction    Hip adduction    Hip internal rotation    Hip external rotation    Knee flexion    Knee extension    Ankle dorsiflexion    Ankle plantarflexion    Ankle inversion    Ankle eversion     (Blank rows = not tested)  LOWER EXTREMITY MMT:  MMT Right eval Left eval  Hip flexion    Hip extension    Hip abduction    Hip adduction    Hip internal rotation    Hip external rotation    Knee flexion    Knee extension    Ankle dorsiflexion    Ankle plantarflexion    Ankle inversion    Ankle eversion     (Blank rows = not tested)   FUNCTIONAL TESTS:  30 seconds chair stand test:6x w/ 2 HHA on RW  2 minute walk test: 115 ft  08/17/23 Progress Note 30x STS: 15 x, no AD : 338 ft, no AD   GAIT: Distance walked: 161ft Assistive device utilized: Environmental consultant - 2 wheeled Level of assistance: Modified independence Comments: mild right R circumduction during swing phase, limited knee flexion in R lower extremity                                                                                                                                 TREATMENT DATE: 09/21/2023  -Sit<>stand to RLE SLS x 10 for 2-3 second holds- CGA -RLE standing with LLE forward and lateral taps 3 x 1' - resetting to center after every step -R single leg stance 3 x 1' with finger hovering  on // bars -Pitting Edema on RLE with > 15 seconds to return to normal -Tandem stance 3 x 1' with 2lb horizontal abduction passing in UB.  -10Kb sumo squats 3 x 12 -Squat to single leg stance with tidal tank holds 10x with CGA cues for weight shifts  09/19/2023  Therapeutic Exercise: -3 minute nustep, level 5, 3 minutes -Walking marches/butt kicks with 3# ankle weights -Lateral stepping 4 laps in parallel bars, with RTB around ankles, pt cued for upright posture -Monster walking 3 laps in parallel bars, with RTB around ankles, pt cued for upright posture -Forward lunges, 1 set of 5 reps better performance going into RLE, pt cued for core activation and upright posture   Neuro- Reeducation: -Sit to stands>step up on RLE, 2 sets of 10 reps, pt cued for SLS on step for 3 seconds -SLS trampoline toss 1 set of 10 reps, bilaterally with red ball, RLE balance impaired much more than left -balance course, with aeromat tandem walking and lilly pads 4 laps, pt requires gait belt and CGA -balance course, with aeromat lateral stepping and lilly pads 2 laps, pt cued for controlled movements, pt requires gait belt and CGA  09/13/23: Nustep [place] x L4 Squat to chain tap with RTB around thigh (to reduce valgus) 2x 10 Lateral lunges onto 6in step height 2x 10 Forward lunge on BOSU with minimal UE A 2x 10  Power up 5" holds on BOSU with 1 HHA 2x 10 Step over on BOSU with 1 HHA 2x 10 SLS Rt 9", Lt 26" Vector stance 5x 5" BLE no HHA [place] 3Pl 3RT each retro and sidestep with SBA     PATIENT EDUCATION:  Education details: PT Evaluation, findings, prognosis, frequency, attendance policy, and HEP if given.  Person educated: Patient Education method: Fish farm manager Education comprehension: verbalized understanding  HOME EXERCISE PROGRAM: Access Code: [email or web address] URL: [email or web address] Date: 07/10/2023 Prepared by: Irene Mannheim  Exercises - Seated Ankle Pumps  - 1 x daily - 7 x weekly - 2-3 sets - 20 reps - Seated Long Arc Quad  - 1 x daily - 7 x weekly - 2-3 sets - 20 reps - Supine Bridge  - 1 x daily - 7 x weekly - 2-3 sets - 10-12 reps - Supine Heel Slide  - 1 x daily - 7 x weekly - 2-3 sets - 20 reps - Supine Quad Set  - 1 x daily - 7 x weekly - 2-3 sets - 20 reps  Access Code: [email or web address] URL: [email or web address] Date: 07/18/2023 Prepared by: Minor Amble  Exercises - Supine Hip Adduction Isometric with Ball  - 2 x daily - 7 x weekly - 2 sets - 10 reps - 5" hold - Hooklying Isometric Hip Abduction with Belt  - 2 x daily - 7 x weekly - 2 sets - 10 reps - 5" hold Access Code: [email or web address] URL: [email or web address] Date: 07/25/2023 Prepared by: Irene Mannheim  Exercises - Clamshell  - 1 x daily - 7 x weekly - 3 sets - 10 reps - Supine Bridge with Resistance Band  - 1 x daily - 7 x weekly - 3 sets - 10 reps Access Code: [email or web address] URL: [email or web address] Date: 08/03/2023 Prepared by: Irene Mannheim  Exercises - Standing Tandem Balance with Counter Support  - 1 x daily - 7 x weekly - 3 sets - 10 reps - Single Leg Stance with Support  - 1 x daily - 7 x weekly - 3 sets - 10  reps - Standing Isometric Hip Abduction with Knee at 90 at Wall  - 1 x daily - 7 x weekly - 3 sets - 10 reps - 10 hold  Access Code: [email or web address] URL: [email or web address] Date: 08/17/2023 Prepared by: Virgia Griffins Powell-Butler  Exercises - Seated Ankle Pumps  - 1 x daily - 7 x weekly - 2-3 sets - 20 reps - Seated Long Arc Quad  - 1 x daily - 7 x weekly - 2-3 sets - 20 reps - Supine Bridge  - 1 x daily - 7 x weekly - 2-3 sets - 10-12 reps - Supine Heel Slide  - 1 x daily - 7 x  weekly - 2-3 sets - 20 reps - Supine Quad Set  - 1 x daily - 7 x weekly - 2-3 sets - 20 reps - Supine Hip Adduction Isometric with Ball  - 2 x daily - 7 x weekly - 2 sets - 10 reps - 5" hold - Hooklying Isometric Hip Abduction with Belt  - 2 x daily - 7 x weekly - 2 sets - 10 reps - 5" hold - Lateral Step Down  - 2 x daily - 7 x weekly - 2 sets - 10 reps  ASSESSMENT:  CLINICAL IMPRESSION: Pt tolerating treatment session well. Primary focus was balance oriented activities. Pt with poor ankle strategies on RLE > LLE during SL intervention. Noted increased lateral falling during balancing single leg activities. Pt noted with BLE swelling, RLE > LLE. Increased edema and poor ankle strategies believed to be leading poor balance reactions. Pt continues with noticeable Left weight shift during squats and rotates internally on LLE during closed chained activities. Patient would continue to benefit from skilled physical therapy for increased endurance with ambulation, increased LE strength, and improved balance for improved quality of life, improved independence with completion of ADLs and continued progress towards therapy goals.      OBJECTIVE IMPAIRMENTS: Abnormal gait, decreased activity tolerance, decreased balance, decreased endurance, decreased mobility, difficulty walking, decreased ROM, decreased strength, postural dysfunction, and pain.   ACTIVITY LIMITATIONS: carrying, lifting, bending, sitting, standing, squatting, sleeping, stairs, transfers, bed mobility, toileting, dressing, and locomotion level  PARTICIPATION LIMITATIONS: shopping, community activity, occupation, and yard work  PERSONAL FACTORS: Age, Past/current experiences, and 3+ comorbidities: see above for details  are also affecting patient's functional outcome.   REHAB POTENTIAL: Good  CLINICAL DECISION MAKING: Stable/uncomplicated  EVALUATION COMPLEXITY: Low   GOALS: Goals reviewed with patient? No  SHORT TERM GOALS:  Target date: 09/14/2023  Pt will be independent with HEP in order to demonstrate participation in Physical Therapy POC.  Baseline:  Goal status: IN PROGRESS  2.  Pt will 2/10 pain during mobility in order to demonstrate improved pain with functional activities.  Baseline:  Goal status: MET  LONG TERM GOALS: Target date: 09/28/2023  Pt will improve 30 Second Chair Stand Test by >4 reps  in order to demonstrate improved functional strength to return to desired activities.  Baseline: See objective.  Goal status: MET  2.  Pt will improve 2 MWT by 246ft in order to demonstrate improved functional ambulatory capacity in community setting.  Baseline: See objective.  Goal status: MET  3.  Pt will improve LEFS score by 20 points in order to demonstrate improved outcomes with functional tasks and ADLs. Baseline: See objective.  Goal status: MET  4.  Pt will improve ABC scale by 20 points in order to demonstrated improved safety and balance during functional activities.. Baseline: See objective.  Goal status: IN PROGRESS  5. Pt will improve R SLS equal to L side to demonstrate improved balance and safety during ambulatory activities.   Baseline: See objective.   Goal Status: Initial    PLAN:  PT FREQUENCY: 2x/week  PT DURATION: 4 weeks  PLANNED INTERVENTIONS: 97164- PT Re-evaluation, 97110-Therapeutic exercises, 97530- Therapeutic activity, 97112- Neuromuscular re-education, 97535- Self Care, 13086- Manual therapy, [id number]- Gait training, 97014- Electrical stimulation (unattended), [id number]- Electrical stimulation (manual), [id number]- Traction (mechanical), Balance training, Stair training, Cryotherapy, and Moist heat  PLAN FOR NEXT SESSION: progress squatting and reaching activities. Lifting training with increased squat. Progress RLE strengthening  Astrid Lay, DPT [facility] [place] [phone] office  11:40 AM, 09/21/23

## 2023-09-22 ENCOUNTER — Ambulatory Visit (INDEPENDENT_AMBULATORY_CARE_PROVIDER_SITE_OTHER): Payer: BLUE CROSS/BLUE SHIELD | Admitting: Family Medicine

## 2023-09-22 VITALS — BP 124/75 | Ht 62.0 in | Wt 142.4 lb

## 2023-09-22 DIAGNOSIS — M1000 Idiopathic gout, unspecified site: Secondary | ICD-10-CM | POA: Diagnosis not present

## 2023-09-22 DIAGNOSIS — N1831 Chronic kidney disease, stage 3a: Secondary | ICD-10-CM

## 2023-09-22 DIAGNOSIS — I10 Essential (primary) hypertension: Secondary | ICD-10-CM

## 2023-09-22 DIAGNOSIS — G4700 Insomnia, unspecified: Secondary | ICD-10-CM

## 2023-09-22 DIAGNOSIS — F439 Reaction to severe stress, unspecified: Secondary | ICD-10-CM | POA: Diagnosis not present

## 2023-09-22 DIAGNOSIS — R600 Localized edema: Secondary | ICD-10-CM | POA: Diagnosis not present

## 2023-09-22 DIAGNOSIS — R2689 Other abnormalities of gait and mobility: Secondary | ICD-10-CM

## 2023-09-22 DIAGNOSIS — D692 Other nonthrombocytopenic purpura: Secondary | ICD-10-CM | POA: Diagnosis not present

## 2023-09-22 MED ORDER — ROSUVASTATIN CALCIUM 20 MG PO TABS: 20.0000 mg | ORAL_TABLET | Freq: Every day | ORAL | 1 refills | Status: DC

## 2023-09-22 MED ORDER — SERTRALINE HCL 100 MG PO TABS: ORAL_TABLET | ORAL | 1 refills | Status: AC

## 2023-09-22 MED ORDER — ALPRAZOLAM 1 MG PO TABS: ORAL_TABLET | ORAL | 1 refills | Status: DC

## 2023-09-22 MED ORDER — POTASSIUM CHLORIDE ER 10 MEQ PO TBCR: EXTENDED_RELEASE_TABLET | ORAL | 1 refills | Status: DC

## 2023-09-22 MED ORDER — METOPROLOL SUCCINATE ER 50 MG PO TB24: 50.0000 mg | ORAL_TABLET | Freq: Every day | ORAL | 1 refills | Status: AC

## 2023-09-22 NOTE — Progress Notes (Signed)
 Subjective:    Patient ID: Terri Douglas, female    DOB: April 26, 1946, 78 y.o.   MRN: [id number]  Discussed the use of AI scribe software for clinical note transcription with the patient, who gave verbal consent to proceed.  History of Present Illness   SISTER CARBONE is a 78 year old female who presents with balance issues and leg swelling.  She experiences significant balance issues, describing her condition as 'wobbling every which way.' She attributes this to a sensation in her head rather than a problem with her shoes or legs. The balance issues began after surgery, which may have affected her nerves. She tries to stay active but sometimes finds it difficult due to her balance problems. No sensation of the room spinning or turning, indicating the balance issues are not related to her inner ear.  She reports swelling in her legs and a rash, described as a venous stasis rash. She experiences purple toes and notes that the swelling and discoloration are more pronounced when her feet are down. She has been informed that this is related to fluid retention and possibly her kidney function, as her creatinine levels have been noted to be on the high side. She also reports bruising under the skin without any trauma, most common on her forearms.  Her diet consists mainly of chicken, and she struggles with meal planning. She acknowledges a lack of vegetables in her diet, although she likes cauliflower and carrots. She mentions having a hamburger recently and expresses difficulty in preparing meals for one.  She is currently taking potassium twice a day and a cholesterol medication, which she is unsure if she takes in the morning or evening. She also takes Xanax  and a medication for her hair.  She expresses concern about her husband's driving abilities, noting that he has difficulty making decisions and often tells stories from the past. She feels isolated and overwhelmed by her caregiving responsibilities,  as she has no one to call for support.         Review of Systems     Objective:    Physical Exam   VITALS: BP- 132/68 SKIN: Senile purpura on forearms.     General-in no acute distress Eyes-no discharge Lungs-respiratory rate normal, CTA CV-no murmurs,RRR Extremities skin warm dry no edema Neuro grossly normal Behavior normal, alert       Assessment & Plan:  Assessment and Plan    Assessment/Plan      Patient with some ankle discomfort also with swelling in the lower legs related in the previous surgery.  She does use compression stockings but recently has not been able to use them because are difficult to get on We did discuss over-the-counter compression stockings to would be easier on her  I am concerned about CKD-we will do lab work when well-hydrated to see how much of her problem gets really is continue current medications Because of the pedal edema we will stop the HCTZ and we will start furosemide  1 each morning Continue potassium Lab work in approximately 2 weeks time  Senile purpura left arm check CBC with avoid trauma to the arm is much as possible no other bruising anywhere else no bleeding issues  Anxiety stress related to taking care of her husband who has onset of dementia bump up dose of sertraline  150 mg daily Counseling could be helpful if she had the time  Also encouraged her to talk with her husband's doctor about his dementia it does sound that he is  having some issues and it would be advisable for him to avoid driving obviously this will be up to the patient and talking with her husband's doctor     Follow-up again within 4 months

## 2023-09-25 ENCOUNTER — Telehealth: Payer: Self-pay | Admitting: Family Medicine

## 2023-09-25 ENCOUNTER — Other Ambulatory Visit: Payer: Self-pay | Admitting: Family Medicine

## 2023-09-25 MED ORDER — FUROSEMIDE 20 MG PO TABS: 20.0000 mg | ORAL_TABLET | Freq: Every day | ORAL | 1 refills | Status: DC

## 2023-09-25 NOTE — Telephone Encounter (Signed)
 Copied from CRM [id number]. Topic: Clinical - Medication Refill >> Sep 25, 2023 10:48 AM Jyl Or B wrote: Most Recent Primary Care Visit:  Provider: Charlotta Cook A  Department: [facility]  Visit Type: OFFICE VISIT  Date: 09/22/2023  Medication: furosemide  (patient stated at her last visit PCP prescribe this new medication.) Patient states she's going out of town tomorrow and would like medication prescribed today.   Has the patient contacted their pharmacy? Yes  (Agent: If yes, when and what did the pharmacy advise?) Contact PCP office  Is this the correct pharmacy for this prescription? Yes  This is the patient's preferred pharmacy:  [facility] - [place],  - [address] [place] [address] Phone: [phone] Fax: [phone]   Has the prescription been filled recently? No  Is the patient out of the medication? Yes  Has the patient been seen for an appointment in the last year OR does the patient have an upcoming appointment? Yes  Can we respond through MyChart? No  Agent: Please be advised that Rx refills may take up to 3 business days. We ask that you follow-up with your pharmacy.

## 2023-09-25 NOTE — Telephone Encounter (Signed)
 Pt notified and voiced understanding

## 2023-09-25 NOTE — Telephone Encounter (Signed)
 Oversight on my part-medicine was sent in to the pharmacy today-Lasix -furosemide  was sent in just now 1 each morning for fluid control Remember to do blood work in approximately 10 days from now follow-up sooner if any problems call if any concerns or issues thank you  Please notify patient

## 2023-09-26 ENCOUNTER — Encounter (HOSPITAL_COMMUNITY)

## 2023-09-28 ENCOUNTER — Encounter (HOSPITAL_COMMUNITY)

## 2023-10-03 ENCOUNTER — Ambulatory Visit (HOSPITAL_COMMUNITY)

## 2023-10-03 ENCOUNTER — Encounter (HOSPITAL_COMMUNITY): Payer: Self-pay

## 2023-10-03 DIAGNOSIS — Z96641 Presence of right artificial hip joint: Secondary | ICD-10-CM | POA: Diagnosis not present

## 2023-10-03 DIAGNOSIS — M6281 Muscle weakness (generalized): Secondary | ICD-10-CM | POA: Diagnosis not present

## 2023-10-03 DIAGNOSIS — Z7409 Other reduced mobility: Secondary | ICD-10-CM

## 2023-10-03 NOTE — Therapy (Signed)
 OUTPATIENT PHYSICAL THERAPY LOWER EXTREMITY TREATMENT   Patient Name: Terri Douglas MRN: [id number] DOB:[birth year], 78 y.o., female Today's Date: 10/03/2023  END OF SESSION:  PT End of Session - 10/03/23 1056     Visit Number 19    Number of Visits 20    Date for PT Re-Evaluation 09/28/23    Authorization Type Medicare A & B; BCBS supplement    Authorization Time Period no auth no limit    Progress Note Due on Visit 20    PT Start Time 1015    PT Stop Time 1057    PT Time Calculation (min) 42 min    Equipment Utilized During Treatment Gait belt    Activity Tolerance Patient tolerated treatment well    Behavior During Therapy WFL for tasks assessed/performed               Past Medical History:  Diagnosis Date   Arthritis    Complication of anesthesia    Gall stones    GERD (gastroesophageal reflux disease)    History of cardiac catheterization 2005   Minor elevation in cardiac enzymes however no significant obstructive CAD   Hyperlipidemia    Hypertension    Insomnia    PONV (postoperative nausea and vomiting)    Prediabetes    Past Surgical History:  Procedure Laterality Date   A-FLUTTER ABLATION N/A 08/01/2017   Procedure: A-FLUTTER ABLATION;  Surgeon: Jolly Needle, MD;  Location: [facility];  Service: Cardiovascular;  Laterality: N/A;   ABDOMINAL HYSTERECTOMY     BACK SURGERY  1992   CARDIAC CATHETERIZATION     BACK IN 2004  SHE THINKS IT CAME BACK 'NORMAL'   CHOLECYSTECTOMY N/A 06/24/2015   Procedure: LAPAROSCOPIC CHOLECYSTECTOMY;  Surgeon: Derral Flick, MD;  Location: [facility];  Service: General;  Laterality: N/A;   COLONOSCOPY N/A 08/30/2012   Procedure: COLONOSCOPY;  Surgeon: Ruby Corporal, MD;  Location: [facility];  Service: Endoscopy;  Laterality: N/A;  830   DILATION AND CURETTAGE OF UTERUS     ESOPHAGOGASTRODUODENOSCOPY  04/15/2011   Procedure: ESOPHAGOGASTRODUODENOSCOPY (EGD);  Surgeon: Ruby Corporal, MD;  Location: [facility];   Service: Endoscopy;  Laterality: N/A;  11:30   ESOPHAGOGASTRODUODENOSCOPY N/A 05/27/2016   Procedure: ESOPHAGOGASTRODUODENOSCOPY (EGD);  Surgeon: Ruby Corporal, MD;  Location: [facility];  Service: Endoscopy;  Laterality: N/A;  11:15   EYE SURGERY     cataract removal   NECK SURGERY  12/13/2018   REVERSE SHOULDER ARTHROPLASTY Left 04/03/2019   Procedure: REVERSE SHOULDER ARTHROPLASTY;  Surgeon: Micheline Ahr, MD;  Location: [facility];  Service: Orthopedics;  Laterality: Left;   VAGINAL HYSTERECTOMY     Patient Active Problem List   Diagnosis Date Noted   Asymptomatic superficial varicose vein of both lower extremities 08/24/2023   Petechiae 08/08/2023   Chronic right-sided low back pain with right-sided sciatica 04/12/2023   Stage 3a chronic kidney disease (HCC) 01/16/2023   History of atrial flutter 04/20/2022   Paroxysmal SVT (supraventricular tachycardia) (HCC) 04/20/2022   Laryngopharyngeal reflux (LPR) 09/28/2020   Primary osteoarthritis of right hip 01/17/2020   Pedal edema 11/19/2019   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   Insomnia 03/15/2019   Hyperlipidemia 06/18/2014   Gout 06/18/2014   Osteopenia 10/28/2013   Hypertension 03/29/2011    PCP: Bennet Brasil, MD  REFERRING PROVIDER:   Osa Blase, MD  Next apt 08/16/23  REFERRING DIAG: R POST THR on 07/06/2023  THERAPY DIAG:  S/P total right hip arthroplasty  Impaired functional mobility, balance, gait, and endurance  Muscle weakness (generalized)  Rationale for Evaluation and Treatment: Rehabilitation  ONSET DATE: 07/06/2023  SUBJECTIVE:   SUBJECTIVE STATEMENT: Pt reports no recent falls. Pt reports balance is still "awful", [place] arm but did not fall. Pt still reports swelling of LEs [place]  Eval:  Lateral approach total hip arthroplasty on 07/06/2023. Pt voices concerns  with husband ability to assist with caring for THA due to potential dementia. Wife voices concerns with safety concerns for hip. Pt reports she feels safe on a physical level and does not feel she will be abused but is concerned for how he treats/assists with level of care.   PERTINENT HISTORY: Reverse TSA Osteopenia HTN PAIN:  Are you having pain? Yes: NPRS scale: 0/10 Pain location: Right hip Pain description: dull/achy Aggravating factors: fast movements Relieving factors: OTC medications, Iceman  PRECAUTIONS: Other: No ER/IR and straight leg.   RED FLAGS: None   WEIGHT BEARING RESTRICTIONS: No  FALLS:  Has patient fallen in last 6 months? No   PATIENT GOALS: "do more on my own and get comfortable doing"   OBJECTIVE:  Note: Objective measures were completed at Evaluation unless otherwise noted.  PATIENT SURVEYS:  LEFS 22/80 = 27.5% ABC Scale: 1070/1600= 66.9%  08/17/23: LEFS: 63/80= ABD Scale: 960/1600= 60%   COGNITION: Overall cognitive status: Within functional limits for tasks assessed     SENSATION: WFL  POSTURE: increased thoracic kyphosis  LOWER EXTREMITY ROM:  Active ROM Right eval Left eval  Hip flexion    Hip extension    Hip abduction    Hip adduction    Hip internal rotation    Hip external rotation    Knee flexion    Knee extension    Ankle dorsiflexion    Ankle plantarflexion    Ankle inversion    Ankle eversion     (Blank rows = not tested)  LOWER EXTREMITY MMT:  MMT Right eval Left eval  Hip flexion    Hip extension    Hip abduction    Hip adduction    Hip internal rotation    Hip external rotation    Knee flexion    Knee extension    Ankle dorsiflexion    Ankle plantarflexion    Ankle inversion    Ankle eversion     (Blank rows = not tested)   FUNCTIONAL TESTS:  30 seconds chair stand test:6x w/ 2 HHA on RW  2 minute walk test: 115 ft  08/17/23 Progress Note 30x STS: 15 x, no AD : 338 ft, no  AD   GAIT: Distance walked: 1110ft Assistive device utilized: Environmental consultant - 2 wheeled Level of assistance: Modified independence Comments: mild right R circumduction during swing phase, limited knee flexion in R lower extremity  TREATMENT DATE: 10/03/2023  Therapeutic Exercise: -Treadmill grade 1 incline, 1.8 speed, 4 minutes -Toe raises against wall, 2 sets of 20 reps -Forward lunges onto bosu ball, RLE only, 1 set of 10 reps, pt cued for core activation and upright posture   Therapeutic Activity: -10Kb sumo squats 3 x 12, to foam block -Squat to single leg stance with tidal tank holds 10x with CGA cues for weight shifts -Sit to stands>step up on RLE, 2 sets of 10 reps, pt cued for SLS on 8 inch step for controlled descent (first set with 10Kb, second with SLS at top -Heel/toe/tandem walks, 40 feet laps with tidal tank    09/21/2023  -Sit<>stand to RLE SLS x 10 for 2-3 second holds- CGA -RLE standing with LLE forward and lateral taps 3 x 1' - resetting to center after every step -R single leg stance 3 x 1' with finger hovering on // bars -Pitting Edema on RLE with > 15 seconds to return to normal -Tandem stance 3 x 1' with 2lb horizontal abduction passing in UB.  -10Kb sumo squats 3 x 12 -Squat to single leg stance with tidal tank holds 10x with CGA cues for weight shifts  09/19/2023  Therapeutic Exercise: -3 minute nustep, level 5, 3 minutes -Walking marches/butt kicks with 3# ankle weights -Lateral stepping 4 laps in parallel bars, with RTB around ankles, pt cued for upright posture -Monster walking 3 laps in parallel bars, with RTB around ankles, pt cued for upright posture -Forward lunges, 1 set of 5 reps better performance going into RLE, pt cued for core activation and upright posture   Neuro- Reeducation: -Sit to stands>step up on RLE, 2 sets of 10  reps, pt cued for SLS on step for 3 seconds -SLS trampoline toss 1 set of 10 reps, bilaterally with red ball, RLE balance impaired much more than left -balance course, with aeromat tandem walking and lilly pads 4 laps, pt requires gait belt and CGA -balance course, with aeromat lateral stepping and lilly pads 2 laps, pt cued for controlled movements, pt requires gait belt and CGA  09/13/23: [place]ep [place] x L4 Squat to chain tap with RTB around thigh (to reduce valgus) 2x 10 Lateral lunges onto 6in step height 2x 10 Forward lunge on BOSU with minimal UE A 2x 10  Power up 5" holds on BOSU with 1 HHA 2x 10 Step over on BOSU with 1 HHA 2x 10 SLS Rt 9", Lt 26" Vector stance 5x 5" BLE no HHA [place] 3Pl 3RT each retro and sidestep with SBA     PATIENT EDUCATION:  Education details: PT Evaluation, findings, prognosis, frequency, attendance policy, and HEP if given.  Person educated: Patient Education method: Medical illustrator Education comprehension: verbalized understanding  HOME EXERCISE PROGRAM: Access Code: [email or web address] URL: [email or web address] Date: 07/10/2023 Prepared by: Irene Mannheim  Exercises - Seated Ankle Pumps  - 1 x daily - 7 x weekly - 2-3 sets - 20 reps - Seated Long Arc Quad  - 1 x daily - 7 x weekly - 2-3 sets - 20 reps - Supine Bridge  - 1 x daily - 7 x weekly - 2-3 sets - 10-12 reps - Supine Heel Slide  - 1 x daily - 7 x weekly - 2-3 sets - 20 reps - Supine Quad Set  - 1 x daily - 7 x weekly - 2-3 sets - 20 reps  Access Code: [email or web address] URL: [email or web address] Date: 07/18/2023 Prepared by: Minor Amble  Exercises -  Supine Hip Adduction Isometric with Ball  - 2 x daily - 7 x weekly - 2 sets - 10 reps - 5" hold - Hooklying Isometric Hip Abduction with Belt  - 2 x daily - 7 x weekly - 2 sets - 10 reps - 5" hold Access Code: [email or web address] URL: [email or web address] Date: 07/25/2023 Prepared by:  Irene Mannheim  Exercises - Clamshell  - 1 x daily - 7 x weekly - 3 sets - 10 reps - Supine Bridge with Resistance Band  - 1 x daily - 7 x weekly - 3 sets - 10 reps Access Code: [email or web address] URL: [email or web address] Date: 08/03/2023 Prepared by: Irene Mannheim  Exercises - Standing Tandem Balance with Counter Support  - 1 x daily - 7 x weekly - 3 sets - 10 reps - Single Leg Stance with Support  - 1 x daily - 7 x weekly - 3 sets - 10 reps - Standing Isometric Hip Abduction with Knee at 90 at Wall  - 1 x daily - 7 x weekly - 3 sets - 10 reps - 10 hold  Access Code: [email or web address] URL: [email or web address] Date: 08/17/2023 Prepared by: Virgia Griffins Powell-Butler  Exercises - Seated Ankle Pumps  - 1 x daily - 7 x weekly - 2-3 sets - 20 reps - Seated Long Arc Quad  - 1 x daily - 7 x weekly - 2-3 sets - 20 reps - Supine Bridge  - 1 x daily - 7 x weekly - 2-3 sets - 10-12 reps - Supine Heel Slide  - 1 x daily - 7 x weekly - 2-3 sets - 20 reps - Supine Quad Set  - 1 x daily - 7 x weekly - 2-3 sets - 20 reps - Supine Hip Adduction Isometric with Ball  - 2 x daily - 7 x weekly - 2 sets - 10 reps - 5" hold - Hooklying Isometric Hip Abduction with Belt  - 2 x daily - 7 x weekly - 2 sets - 10 reps - 5" hold - Lateral Step Down  - 2 x daily - 7 x weekly - 2 sets - 10 reps  ASSESSMENT:  CLINICAL IMPRESSION: Patient continues to demonstrate decreased RLE strength, decreased gait quality and balance. Patient also demonstrates good performance on treadmill during today's session, slight shuffle gait noted. Patient able to progress dynamic balance and core activation exercises today with tidal tank with functional mobility, good performance with  need for verbal cueing, gait belt, and CGA. Patient would continue to benefit from skilled physical therapy for increased endurance with ambulation, increased LE strength, and improved balance for improved quality of life, improved independence  with gait training and continued progress towards therapy goals.       OBJECTIVE IMPAIRMENTS: Abnormal gait, decreased activity tolerance, decreased balance, decreased endurance, decreased mobility, difficulty walking, decreased ROM, decreased strength, postural dysfunction, and pain.   ACTIVITY LIMITATIONS: carrying, lifting, bending, sitting, standing, squatting, sleeping, stairs, transfers, bed mobility, toileting, dressing, and locomotion level  PARTICIPATION LIMITATIONS: shopping, community activity, occupation, and yard work  PERSONAL FACTORS: Age, Past/current experiences, and 3+ comorbidities: see above for details  are also affecting patient's functional outcome.   REHAB POTENTIAL: Good  CLINICAL DECISION MAKING: Stable/uncomplicated  EVALUATION COMPLEXITY: Low   GOALS: Goals reviewed with patient? No  SHORT TERM GOALS: Target date: 09/14/2023  Pt will be independent with HEP in order to demonstrate participation in Physical Therapy POC.  Baseline:  Goal status: IN PROGRESS  2.  Pt will 2/10 pain during  mobility in order to demonstrate improved pain with functional activities.  Baseline:  Goal status: MET  LONG TERM GOALS: Target date: 09/28/2023  Pt will improve 30 Second Chair Stand Test by >4 reps  in order to demonstrate improved functional strength to return to desired activities.  Baseline: See objective.  Goal status: MET  2.  Pt will improve 2 MWT by 258ft in order to demonstrate improved functional ambulatory capacity in community setting.  Baseline: See objective.  Goal status: MET  3.  Pt will improve LEFS score by 20 points in order to demonstrate improved outcomes with functional tasks and ADLs. Baseline: See objective.  Goal status: MET  4.  Pt will improve ABC scale by 20 points in order to demonstrated improved safety and balance during functional activities.. Baseline: See objective.  Goal status: IN PROGRESS  5. Pt will improve R SLS equal  to L side to demonstrate improved balance and safety during ambulatory activities.   Baseline: See objective.   Goal Status: Initial    PLAN:  PT FREQUENCY: 2x/week  PT DURATION: 4 weeks  PLANNED INTERVENTIONS: 97164- PT Re-evaluation, 97110-Therapeutic exercises, 97530- Therapeutic activity, [id number]- Neuromuscular re-education, 97535- Self Care, 16109- Manual therapy, [id number]- Gait training, 97014- Electrical stimulation (unattended), [phone]- Electrical stimulation (manual), [id number]- Traction (mechanical), Balance training, Stair training, Cryotherapy, and Moist heat  PLAN FOR NEXT SESSION: re evaluation  Armond Bertin, PT, DPT [facility] Office: [phone] 11:04 AM, 10/03/23

## 2023-10-04 ENCOUNTER — Ambulatory Visit: Payer: BLUE CROSS/BLUE SHIELD | Admitting: Family Medicine

## 2023-10-05 ENCOUNTER — Ambulatory Visit (HOSPITAL_COMMUNITY): Attending: Orthopedic Surgery

## 2023-10-05 ENCOUNTER — Encounter (HOSPITAL_COMMUNITY): Payer: Self-pay

## 2023-10-05 DIAGNOSIS — Z96641 Presence of right artificial hip joint: Secondary | ICD-10-CM

## 2023-10-05 DIAGNOSIS — Z7409 Other reduced mobility: Secondary | ICD-10-CM

## 2023-10-05 DIAGNOSIS — I10 Essential (primary) hypertension: Secondary | ICD-10-CM | POA: Diagnosis not present

## 2023-10-05 DIAGNOSIS — M6281 Muscle weakness (generalized): Secondary | ICD-10-CM | POA: Diagnosis not present

## 2023-10-05 DIAGNOSIS — D692 Other nonthrombocytopenic purpura: Secondary | ICD-10-CM | POA: Diagnosis not present

## 2023-10-05 NOTE — Therapy (Signed)
 OUTPATIENT PHYSICAL THERAPY LOWER EXTREMITY TREATMENT/DISCHARGE    PHYSICAL THERAPY DISCHARGE SUMMARY  Visits from Start of Care: 20  Current functional level related to goals / functional outcomes: See below   Remaining deficits: See below   Education / Equipment: See below   Patient agrees to discharge. Patient goals were partially met. Patient is being discharged due to meeting the stated rehab goals.   Patient Name: Terri Douglas MRN: [id number] DOB:08-Oct-1945, 78 y.o., female Today's Date: 10/05/2023  END OF SESSION:  PT End of Session - 10/05/23 1105     Visit Number 20    Number of Visits 20    Date for PT Re-Evaluation 10/05/23    Authorization Type Medicare A & B; BCBS supplement    Authorization Time Period no auth no limit    Progress Note Due on Visit 20    PT Start Time 1015    PT Stop Time 1100    PT Time Calculation (min) 45 min    Activity Tolerance Patient tolerated treatment well    Behavior During Therapy WFL for tasks assessed/performed                Past Medical History:  Diagnosis Date   Arthritis    Complication of anesthesia    Gall stones    GERD (gastroesophageal reflux disease)    History of cardiac catheterization 2005   Minor elevation in cardiac enzymes however no significant obstructive CAD   Hyperlipidemia    Hypertension    Insomnia    PONV (postoperative nausea and vomiting)    Prediabetes    Past Surgical History:  Procedure Laterality Date   A-FLUTTER ABLATION N/A 08/01/2017   Procedure: A-FLUTTER ABLATION;  Surgeon: Jolly Needle, MD;  Location: [facility];  Service: Cardiovascular;  Laterality: N/A;   ABDOMINAL HYSTERECTOMY     BACK SURGERY  1992   CARDIAC CATHETERIZATION     BACK IN 2004  SHE THINKS IT CAME BACK 'NORMAL'   CHOLECYSTECTOMY N/A 06/24/2015   Procedure: LAPAROSCOPIC CHOLECYSTECTOMY;  Surgeon: Derral Flick, MD;  Location: [facility];  Service: General;  Laterality: N/A;   COLONOSCOPY N/A  08/30/2012   Procedure: COLONOSCOPY;  Surgeon: Ruby Corporal, MD;  Location: [facility];  Service: Endoscopy;  Laterality: N/A;  830   DILATION AND CURETTAGE OF UTERUS     ESOPHAGOGASTRODUODENOSCOPY  04/15/2011   Procedure: ESOPHAGOGASTRODUODENOSCOPY (EGD);  Surgeon: Ruby Corporal, MD;  Location: [facility];  Service: Endoscopy;  Laterality: N/A;  11:30   ESOPHAGOGASTRODUODENOSCOPY N/A 05/27/2016   Procedure: ESOPHAGOGASTRODUODENOSCOPY (EGD);  Surgeon: Ruby Corporal, MD;  Location: [facility];  Service: Endoscopy;  Laterality: N/A;  11:15   EYE SURGERY     cataract removal   NECK SURGERY  12/13/2018   REVERSE SHOULDER ARTHROPLASTY Left 04/03/2019   Procedure: REVERSE SHOULDER ARTHROPLASTY;  Surgeon: Micheline Ahr, MD;  Location: [facility];  Service: Orthopedics;  Laterality: Left;   VAGINAL HYSTERECTOMY     Patient Active Problem List   Diagnosis Date Noted   Asymptomatic superficial varicose vein of both lower extremities 08/24/2023   Petechiae 08/08/2023   Chronic right-sided low back pain with right-sided sciatica 04/12/2023   Stage 3a chronic kidney disease (HCC) 01/16/2023   History of atrial flutter 04/20/2022   Paroxysmal SVT (supraventricular tachycardia) (HCC) 04/20/2022   Laryngopharyngeal reflux (LPR) 09/28/2020   Primary osteoarthritis of right hip 01/17/2020   Pedal edema 11/19/2019   Degenerative arthritis of  left shoulder region 04/03/2019   Insomnia 03/15/2019   Hyperlipidemia 06/18/2014   Gout 06/18/2014   Osteopenia 10/28/2013   Hypertension 03/29/2011    PCP: Bennet Brasil, MD  REFERRING PROVIDER:   Osa Blase, MD  Next apt 08/16/23  REFERRING DIAG: R POST THR on 07/06/2023  THERAPY DIAG:  S/P total right hip arthroplasty  Impaired functional mobility, balance, gait, and endurance  Muscle weakness (generalized)  Rationale for Evaluation and Treatment: Rehabilitation  ONSET DATE: 07/06/2023  SUBJECTIVE:   SUBJECTIVE STATEMENT: Pt  reports getting increased dose of fluid pill. Pt reports no recent falls. Pt reports she enjoys coming to therapy and will be sad to leave.  Eval:  Lateral approach total hip arthroplasty on 07/06/2023. Pt voices concerns with husband ability to assist with caring for THA due to potential dementia. Wife voices concerns with safety concerns for hip. Pt reports she feels safe on a physical level and does not feel she will be abused but is concerned for how he treats/assists with level of care.   PERTINENT HISTORY: Reverse TSA Osteopenia HTN PAIN:  Are you having pain? Yes: NPRS scale: 0/10 Pain location: Right hip Pain description: dull/achy Aggravating factors: fast movements Relieving factors: OTC medications, Iceman  PRECAUTIONS: Other: No ER/IR and straight leg.   RED FLAGS: None   WEIGHT BEARING RESTRICTIONS: No  FALLS:  Has patient fallen in last 6 months? No   PATIENT GOALS: "do more on my own and get comfortable doing"   OBJECTIVE:  Note: Objective measures were completed at Evaluation unless otherwise noted.  PATIENT SURVEYS:  LEFS 22/80 = 27.5% ABC Scale: 1070/1600= 66.9%  08/17/23: LEFS: 63/80= ABD Scale: 960/1600= 60% ABC Scale: 1440/1600   COGNITION: Overall cognitive status: Within functional limits for tasks assessed     SENSATION: WFL  POSTURE: increased thoracic kyphosis  LOWER EXTREMITY ROM:  Active ROM Right eval Left eval  Hip flexion    Hip extension    Hip abduction    Hip adduction    Hip internal rotation    Hip external rotation    Knee flexion    Knee extension    Ankle dorsiflexion    Ankle plantarflexion    Ankle inversion    Ankle eversion     (Blank rows = not tested)  LOWER EXTREMITY MMT:  MMT Right eval Left eval  Hip flexion    Hip extension    Hip abduction    Hip adduction    Hip internal rotation    Hip external rotation    Knee flexion    Knee extension    Ankle dorsiflexion    Ankle plantarflexion     Ankle inversion    Ankle eversion     (Blank rows = not tested)   FUNCTIONAL TESTS:  30 seconds chair stand test:6x w/ 2 HHA on RW  2 minute walk test: 115 ft  08/17/23 Progress Note 30x STS: 15 x, no AD : 338 ft, no AD 10/05/23:  SLS R: 11.81 L: 17.58 seconds  GAIT: Distance walked: 169ft Assistive device utilized: Environmental consultant - 2 wheeled Level of assistance: Modified independence Comments: mild right R circumduction during swing phase, limited knee flexion in R lower extremity  TREATMENT DATE: 10/05/2023  Therapeutic Exercise: -Reactive 3 way hip kicks, 3 sets of 8 reps per side, pt requires multiple verbal cues and was given video demonstration for proper form. -Banded walking with green theraband, lateral stepping and monster walks, 1 lap each, 40 feet per lap, pt able to perform monster walks backward with no LOB -SLS activity, 2 reps bilaterally, 30 seconds each   Therapeutic Activity: -Step ups, 3 sets of 10 reps bilaterally, pt cued for being close to rail while doing this at home -Runner step up, 2 sets of 8 bilaterally, pt cued for increased SLS hold at top -STS 2 sets of 3 throughout session  Pt educated on best set up at home for HEP, importance of compliance with HEP and walking regiment.    10/03/2023  Therapeutic Exercise: -Treadmill grade 1 incline, 1.8 speed, 4 minutes -Toe raises against wall, 2 sets of 20 reps -Forward lunges onto bosu ball, RLE only, 1 set of 10 reps, pt cued for core activation and upright posture   Therapeutic Activity: -10Kb sumo squats 3 x 12, to foam block -Squat to single leg stance with tidal tank holds 10x with CGA cues for weight shifts -Sit to stands>step up on RLE, 2 sets of 10 reps, pt cued for SLS on 8 inch step for controlled descent (first set with 10Kb, second with SLS at top -Heel/toe/tandem walks,  40 feet laps with tidal tank    09/21/2023  -Sit<>stand to RLE SLS x 10 for 2-3 second holds- CGA -RLE standing with LLE forward and lateral taps 3 x 1' - resetting to center after every step -R single leg stance 3 x 1' with finger hovering on // bars -Pitting Edema on RLE with > 15 seconds to return to normal -Tandem stance 3 x 1' with 2lb horizontal abduction passing in UB.  -10Kb sumo squats 3 x 12 -Squat to single leg stance with tidal tank holds 10x with CGA cues for weight shifts  09/19/2023  Therapeutic Exercise: -3 minute nustep, level 5, 3 minutes -Walking marches/butt kicks with 3# ankle weights -Lateral stepping 4 laps in parallel bars, with RTB around ankles, pt cued for upright posture -Monster walking 3 laps in parallel bars, with RTB around ankles, pt cued for upright posture -Forward lunges, 1 set of 5 reps better performance going into RLE, pt cued for core activation and upright posture   Neuro- Reeducation: -Sit to stands>step up on RLE, 2 sets of 10 reps, pt cued for SLS on step for 3 seconds -SLS trampoline toss 1 set of 10 reps, bilaterally with red ball, RLE balance impaired much more than left -balance course, with aeromat tandem walking and lilly pads 4 laps, pt requires gait belt and CGA -balance course, with aeromat lateral stepping and lilly pads 2 laps, pt cued for controlled movements, pt requires gait belt and CGA    PATIENT EDUCATION:  Education details: PT Evaluation, findings, prognosis, frequency, attendance policy, and HEP if given.  Person educated: Patient Education method: Medical illustrator Education comprehension: verbalized understanding  HOME EXERCISE PROGRAM: Access Code: [email or web address] URL: [email or web address] Date: 07/10/2023 Prepared by: Irene Mannheim  Exercises - Seated Ankle Pumps  - 1 x daily - 7 x weekly - 2-3 sets - 20 reps - Seated Long Arc Quad  - 1 x daily - 7 x weekly - 2-3 sets - 20 reps - Supine  Bridge  - 1 x daily - 7 x weekly - 2-3 sets - 10-12 reps -  Supine Heel Slide  - 1 x daily - 7 x weekly - 2-3 sets - 20 reps - Supine Quad Set  - 1 x daily - 7 x weekly - 2-3 sets - 20 reps  Access Code: [email or web address] URL: [email or web address] Date: 07/18/2023 Prepared by: Minor Amble  Exercises - Supine Hip Adduction Isometric with Ball  - 2 x daily - 7 x weekly - 2 sets - 10 reps - 5" hold - Hooklying Isometric Hip Abduction with Belt  - 2 x daily - 7 x weekly - 2 sets - 10 reps - 5" hold Access Code: [email or web address] URL: [email or web address] Date: 07/25/2023 Prepared by: Irene Mannheim  Exercises - Clamshell  - 1 x daily - 7 x weekly - 3 sets - 10 reps - Supine Bridge with Resistance Band  - 1 x daily - 7 x weekly - 3 sets - 10 reps Access Code: [email or web address] URL: [email or web address] Date: 08/03/2023 Prepared by: Irene Mannheim  Exercises - Standing Tandem Balance with Counter Support  - 1 x daily - 7 x weekly - 3 sets - 10 reps - Single Leg Stance with Support  - 1 x daily - 7 x weekly - 3 sets - 10 reps - Standing Isometric Hip Abduction with Knee at 90 at Wall  - 1 x daily - 7 x weekly - 3 sets - 10 reps - 10 hold  Access Code: [email or web address] URL: [email or web address] Date: 08/17/2023 Prepared by: Virgia Griffins Powell-Butler  Exercises - Seated Ankle Pumps  - 1 x daily - 7 x weekly - 2-3 sets - 20 reps - Seated Long Arc Quad  - 1 x daily - 7 x weekly - 2-3 sets - 20 reps - Supine Bridge  - 1 x daily - 7 x weekly - 2-3 sets - 10-12 reps - Supine Heel Slide  - 1 x daily - 7 x weekly - 2-3 sets - 20 reps - Supine Quad Set  - 1 x daily - 7 x weekly - 2-3 sets - 20 reps - Supine Hip Adduction Isometric with Ball  - 2 x daily - 7 x weekly - 2 sets - 10 reps - 5" hold - Hooklying Isometric Hip Abduction with Belt  - 2 x daily - 7 x weekly - 2 sets - 10 reps - 5" hold - Lateral Step Down  - 2 x daily - 7 x weekly - 2 sets - 10 reps Access  Code: [email or web address] URL: [email or web address] Date: 10/05/2023 Prepared by: Armond Bertin  Exercises - Side Stepping with Resistance at Ankles  - 1 x daily - 7 x weekly - 3 sets - 10 reps - Forward Monster Walks  - 1 x daily - 7 x weekly - 3 sets - 10 reps - Step Up  - 1 x daily - 7 x weekly - 3 sets - 10 reps - Runner's Climb  - 1 x daily - 7 x weekly - 3 sets - 10 reps  ASSESSMENT:  CLINICAL IMPRESSION: Patient continues to demonstrate improved LE strength, improved gait quality and balance. Patient also demonstrates improved endurance with SLS activities during today's session. Patient has met all but one therapeutic goal and has demonstrated good progress on SLS goal. Patient would continue to benefit from HEP for increased endurance with ambulation, increased LE strength, and improved balance for improved quality of life, improved community ambulation and continued progress towards therapy goals. Pt to be discharged at this time to HEP.      OBJECTIVE IMPAIRMENTS: Abnormal gait,  decreased activity tolerance, decreased balance, decreased endurance, decreased mobility, difficulty walking, decreased ROM, decreased strength, postural dysfunction, and pain.   ACTIVITY LIMITATIONS: carrying, lifting, bending, sitting, standing, squatting, sleeping, stairs, transfers, bed mobility, toileting, dressing, and locomotion level  PARTICIPATION LIMITATIONS: shopping, community activity, occupation, and yard work  PERSONAL FACTORS: Age, Past/current experiences, and 3+ comorbidities: see above for details  are also affecting patient's functional outcome.   REHAB POTENTIAL: Good  CLINICAL DECISION MAKING: Stable/uncomplicated  EVALUATION COMPLEXITY: Low   GOALS: Goals reviewed with patient? No  SHORT TERM GOALS: Target date: 09/14/2023  Pt will be independent with HEP in order to demonstrate participation in Physical Therapy POC.  Baseline:  Goal status: MET  2.  Pt will 2/10  pain during mobility in order to demonstrate improved pain with functional activities.  Baseline:  Goal status: MET  LONG TERM GOALS: Target date: 09/28/2023  Pt will improve 30 Second Chair Stand Test by >4 reps  in order to demonstrate improved functional strength to return to desired activities.  Baseline: See objective.  Goal status: MET  2.  Pt will improve 2 MWT by 275ft in order to demonstrate improved functional ambulatory capacity in community setting.  Baseline: See objective.  Goal status: MET  3.  Pt will improve LEFS score by 20 points in order to demonstrate improved outcomes with functional tasks and ADLs. Baseline: See objective.  Goal status: MET  4.  Pt will improve ABC scale by 20 points in order to demonstrated improved safety and balance during functional activities.. Baseline: See objective.  Goal status: MET  5. Pt will improve R SLS equal to L side to demonstrate improved balance and safety during ambulatory activities.   Baseline: See objective.   Goal Status: Partially met    PLAN:  PT FREQUENCY: 2x/week  PT DURATION: 4 weeks  PLANNED INTERVENTIONS: 97164- PT Re-evaluation, 97110-Therapeutic exercises, 97530- Therapeutic activity, [id number]- Neuromuscular re-education, 97535- Self Care, 08657- Manual therapy, [id number]- Gait training, 97014- Electrical stimulation (unattended), [phone]- Electrical stimulation (manual), [id number]- Traction (mechanical), Balance training, Stair training, Cryotherapy, and Moist heat  PLAN FOR NEXT SESSION: discharged  Armond Bertin, PT, DPT [facility] Office: [phone] 11:29 AM, 10/05/23

## 2023-10-06 LAB — BASIC METABOLIC PANEL WITH GFR
BUN/Creatinine Ratio: 16 (ref 12–28)
BUN: 21 mg/dL (ref 8–27)
CO2: 24 mmol/L (ref 20–29)
Calcium: 8.9 mg/dL (ref 8.7–10.3)
Chloride: 93 mmol/L — ABNORMAL LOW (ref 96–106)
Creatinine, Ser: 1.29 mg/dL — ABNORMAL HIGH (ref 0.57–1.00)
Glucose: 77 mg/dL (ref 70–99)
Potassium: 4.5 mmol/L (ref 3.5–5.2)
Sodium: 133 mmol/L — ABNORMAL LOW (ref 134–144)
eGFR: 43 mL/min/{1.73_m2} — ABNORMAL LOW (ref >59)

## 2023-10-06 LAB — CBC WITH DIFFERENTIAL/PLATELET
Basophils Absolute: 0 10*3/uL (ref 0.0–0.2)
Basos: 1 %
EOS (ABSOLUTE): 0.1 10*3/uL (ref 0.0–0.4)
Eos: 1 %
Hematocrit: 38.4 % (ref 34.0–46.6)
Hemoglobin: 12.1 g/dL (ref 11.1–15.9)
Immature Grans (Abs): 0 10*3/uL (ref 0.0–0.1)
Immature Granulocytes: 0 %
Lymphocytes Absolute: 1.2 10*3/uL (ref 0.7–3.1)
Lymphs: 17 %
MCH: 28.9 pg (ref 26.6–33.0)
MCHC: 31.5 g/dL (ref 31.5–35.7)
MCV: 92 fL (ref 79–97)
Monocytes Absolute: 0.7 10*3/uL (ref 0.1–0.9)
Monocytes: 10 %
Neutrophils Absolute: 5.2 10*3/uL (ref 1.4–7.0)
Neutrophils: 71 %
Platelets: 293 10*3/uL (ref 150–450)
RBC: 4.18 x10E6/uL (ref 3.77–5.28)
RDW: 12.2 % (ref 11.7–15.4)
WBC: 7.3 10*3/uL (ref 3.4–10.8)

## 2023-10-06 LAB — MICROALBUMIN / CREATININE URINE RATIO
Creatinine, Urine: 43.2 mg/dL
Microalb/Creat Ratio: 15 mg/g{creat} (ref 0–29)
Microalbumin, Urine: 6.4 ug/mL

## 2023-10-08 ENCOUNTER — Encounter: Payer: Self-pay | Admitting: Family Medicine

## 2023-10-11 ENCOUNTER — Telehealth: Payer: Self-pay | Admitting: Family Medicine

## 2023-10-11 ENCOUNTER — Other Ambulatory Visit: Payer: Self-pay

## 2023-10-11 DIAGNOSIS — N1831 Chronic kidney disease, stage 3a: Secondary | ICD-10-CM

## 2023-10-11 DIAGNOSIS — I10 Essential (primary) hypertension: Secondary | ICD-10-CM

## 2023-10-11 NOTE — Telephone Encounter (Signed)
 Patient is wanting to know if referral has been put in for her to go to [facility] in [place].She told call Center you were going to refer her there.But nothing is showing in chart yet.

## 2023-10-12 ENCOUNTER — Other Ambulatory Visit: Payer: Self-pay

## 2023-10-12 DIAGNOSIS — R7989 Other specified abnormal findings of blood chemistry: Secondary | ICD-10-CM

## 2023-10-12 DIAGNOSIS — N1831 Chronic kidney disease, stage 3a: Secondary | ICD-10-CM

## 2023-10-13 DIAGNOSIS — D224 Melanocytic nevi of scalp and neck: Secondary | ICD-10-CM | POA: Diagnosis not present

## 2023-10-13 DIAGNOSIS — L578 Other skin changes due to chronic exposure to nonionizing radiation: Secondary | ICD-10-CM | POA: Diagnosis not present

## 2023-10-13 DIAGNOSIS — D2261 Melanocytic nevi of right upper limb, including shoulder: Secondary | ICD-10-CM | POA: Diagnosis not present

## 2023-10-13 DIAGNOSIS — L570 Actinic keratosis: Secondary | ICD-10-CM | POA: Diagnosis not present

## 2023-10-13 DIAGNOSIS — L814 Other melanin hyperpigmentation: Secondary | ICD-10-CM | POA: Diagnosis not present

## 2023-10-13 DIAGNOSIS — L650 Telogen effluvium: Secondary | ICD-10-CM | POA: Diagnosis not present

## 2023-10-13 DIAGNOSIS — Z85828 Personal history of other malignant neoplasm of skin: Secondary | ICD-10-CM | POA: Diagnosis not present

## 2023-10-13 DIAGNOSIS — D225 Melanocytic nevi of trunk: Secondary | ICD-10-CM | POA: Diagnosis not present

## 2023-10-13 DIAGNOSIS — L821 Other seborrheic keratosis: Secondary | ICD-10-CM | POA: Diagnosis not present

## 2023-10-13 DIAGNOSIS — R58 Hemorrhage, not elsewhere classified: Secondary | ICD-10-CM | POA: Diagnosis not present

## 2023-10-13 DIAGNOSIS — L719 Rosacea, unspecified: Secondary | ICD-10-CM | POA: Diagnosis not present

## 2023-10-13 DIAGNOSIS — D2271 Melanocytic nevi of right lower limb, including hip: Secondary | ICD-10-CM | POA: Diagnosis not present

## 2023-11-13 ENCOUNTER — Ambulatory Visit: Admitting: Nurse Practitioner

## 2023-11-13 ENCOUNTER — Ambulatory Visit: Payer: Self-pay

## 2023-11-13 VITALS — BP 127/74 | HR 61 | Temp 98.1°F | Ht 62.0 in | Wt 134.2 lb

## 2023-11-13 DIAGNOSIS — M542 Cervicalgia: Secondary | ICD-10-CM | POA: Diagnosis not present

## 2023-11-13 DIAGNOSIS — M4722 Other spondylosis with radiculopathy, cervical region: Secondary | ICD-10-CM | POA: Diagnosis not present

## 2023-11-13 DIAGNOSIS — M62830 Muscle spasm of back: Secondary | ICD-10-CM

## 2023-11-13 MED ORDER — AMITRIPTYLINE HCL 10 MG PO TABS
10.0000 mg | ORAL_TABLET | Freq: Every day | ORAL | 0 refills | Status: AC
Start: 2023-11-13 — End: ?

## 2023-11-13 NOTE — Patient Instructions (Signed)
 TENS unit as directed for muscle spasms left upper back Topicals such as Biofreeze or Voltaren gel Apply ice and/or heat

## 2023-11-13 NOTE — Telephone Encounter (Signed)
 Appointment scheduled.

## 2023-11-13 NOTE — Telephone Encounter (Signed)
 Copied from CRM [id number]. Topic: Clinical - Red Word Triage >> Nov 13, 2023  9:00 AM Baldomero Bone wrote: Red Word that prompted transfer to Nurse Triage:  Patient has neck pain, tingling going down left arm and left shoulder blade is swollen. Patient is in extreme pain. Callback number is [phone] Reason for Disposition  [1] SEVERE neck pain (e.g., excruciating, unable to do any normal activities) AND [2] not improved after 2 hours of pain medicine  Answer Assessment - Initial Assessment Questions 1. ONSET: "When did the pain begin?"      2 weeks ago  2. LOCATION: "Where does it hurt?"      Le ft side of neck down in shoulder and arm  3. PATTERN "Does the pain come and go, or has it been constant since it started?"      Constant  4. SEVERITY: "How bad is the pain?"  (Scale 1-10; or mild, moderate, severe)   - NO PAIN (0): no pain or only slight stiffness    - MILD (1-3): doesn't interfere with normal activities    - MODERATE (4-7): interferes with normal activities or awakens from sleep    - SEVERE (8-10):  excruciating pain, unable to do any normal activities      severe 5. RADIATION: "Does the pain go anywhere else, shoot into your arms?"     Yes, numbness in left arm  6. CORD SYMPTOMS: "Any weakness or numbness of the arms or legs?"     Numbness in arm  7. CAUSE: "What do you think is causing the neck pain?"     Neck surgery  8. NECK OVERUSE: "Any recent activities that involved turning or twisting the neck?"     na 9. OTHER SYMPTOMS: "Do you have any other symptoms?" (e.g., headache, fever, chest pain, difficulty breathing, neck swelling)     Weakness     Pt has had several neck surgeries.  Pt stated this pain has been the same after surgeries but is now hurting in shoulder blades. Pt has been taking old pain medication from a hip surgery. Not sure of name.  Pt denies chest pain but does feel weak overall due to "not moving because I"m in such pain."  Protocols used: Neck Pain or  Stiffness-A-AH FYI Only or Action Required?: Action required by provider  Patient was last seen in primary care on 09/22/2023 by Bennet Brasil, MD. Called Nurse Triage reporting Neck Pain. Symptoms began several weeks ago. Interventions attempted: Prescription medications: old script from hip surgery. Symptoms are: gradually worsening.  Triage Disposition: See HCP Within 4 Hours (Or PCP Triage)  Patient/caregiver understands and will follow disposition?: Yes

## 2023-11-14 ENCOUNTER — Encounter: Payer: Self-pay | Admitting: Nurse Practitioner

## 2023-11-14 DIAGNOSIS — I10 Essential (primary) hypertension: Secondary | ICD-10-CM | POA: Diagnosis not present

## 2023-11-14 DIAGNOSIS — N1832 Chronic kidney disease, stage 3b: Secondary | ICD-10-CM | POA: Diagnosis not present

## 2023-11-14 NOTE — Progress Notes (Signed)
 Subjective:    Patient ID: Terri Douglas, female    DOB: 09-Nov-1945, 78 y.o.   MRN: [id number]  HPI Presents for complaints of a flareup of her neck pain for the past 3 weeks.  Has a history of 3 neck surgeries, the last one was in 2020.  Her neurosurgeon is Dr. Ellery Guthrie.  Went by that office today, they could not see her and she has an appointment sometime in July for recheck with him.  Pain has been severe, greater than 10 out of 10.  Causes tingling and a slight burning pain going down her left arm.  States these were the same symptoms she had before her last neck surgery.  Has chronic weakness in the left arm, no change.  Has stage 3a kidney disease so limited on what she can take for pain.  Had several oxycodone  5 mg leftover from her previous surgery on her hip, has taken 5 total which provides minimal short relief.  Has several orthopedic problems including shoulder joint pain.  History of degenerative arthritis changes. Also complaints of tenderness and "swelling" in the left upper back area.   Review of Systems  Constitutional:  Positive for fatigue.  Respiratory:  Positive for chest tightness. Negative for shortness of breath.   Cardiovascular:  Negative for chest pain.  Musculoskeletal:  Positive for arthralgias, neck pain and neck stiffness.      11/13/2023   10:34 AM  Depression screen PHQ 2/9  Decreased Interest 1  Down, Depressed, Hopeless 1  PHQ - 2 Score 2  Altered sleeping 1  Tired, decreased energy 0  Change in appetite 0  Feeling bad or failure about yourself  0  Trouble concentrating 0  Moving slowly or fidgety/restless 0  Suicidal thoughts 0  PHQ-9 Score 3  Difficult doing work/chores Somewhat difficult      11/13/2023   10:34 AM 09/22/2023    1:40 PM 08/22/2023    3:54 PM 08/08/2023    3:13 PM  GAD 7 : Generalized Anxiety Score  Nervous, Anxious, on Edge 0 0 0 1  Control/stop worrying 1 1 0 0  Worry too much - different things 0 1 0 0  Trouble relaxing 0 0 0 1   Restless 0 0 0 0  Easily annoyed or irritable 1 1 0 1  Afraid - awful might happen 0 0 0 0  Total GAD 7 Score 2 3 0 3  Anxiety Difficulty Somewhat difficult Somewhat difficult Not difficult at all Not difficult at all         Objective:   Physical Exam Vitals reviewed.  Constitutional:      General: She is not in acute distress. Neck:     Comments: Very limited ROM of the neck particularly lateral movements but she states this is chronic.  Hand and arm strength 3+ right 2+ left.  Strong radial pulses bilaterally.  Normal capillary refill in the fingers.  Palpation of the upper back area showed a very tight tender rhomboid muscle. Cardiovascular:     Rate and Rhythm: Normal rate and regular rhythm.  Pulmonary:     Effort: Pulmonary effort is normal.     Breath sounds: Normal breath sounds.  Musculoskeletal:     Cervical back: Rigidity present. Pain with movement and muscular tenderness present. Decreased range of motion.  Neurological:     Mental Status: She is alert.    Today's Vitals   11/13/23 1027 11/13/23 1037  BP: (!) 145/74 127/74  Pulse: 61   Temp: 98.1 F (36.7 C)   SpO2: 95%   Weight: 134 lb 3.2 oz (60.9 kg)   Height: 5\' 2"  (1.575 m)    Body mass index is 24.55 kg/m.         Assessment & Plan:   Problem List Items Addressed This Visit       Nervous and Auditory   Osteoarthritis of spine with radiculopathy, cervical region   Relevant Medications   amitriptyline (ELAVIL) 10 MG tablet     Other   Muscle spasm of back   Neck pain on left side - Primary   Meds ordered this encounter  Medications   amitriptyline (ELAVIL) 10 MG tablet    Sig: Take 1 tablet (10 mg total) by mouth at bedtime. For nerve pain    Dispense:  30 tablet    Refill:  0    Supervising Provider:   Charlotta Cook A [[id number]]   Trial of low-dose amitriptyline as a temporary measure for her nerve pain.  Continue limited amounts of oxycodone  for severe pain. Recommend TENS unit  for muscle spasms of the upper back. Discussed use of topicals such as Biofreeze or Voltaren gel. Apply ice or heat to neck and upper back as needed. MRI of the cervical spine scheduled due to her history and flareup of her symptoms particularly neuropathic in her left arm. Follow-up with Dr. Ellery Guthrie as planned.  Recheck here in the meantime if needed.

## 2023-11-20 ENCOUNTER — Encounter: Payer: Self-pay | Admitting: Family Medicine

## 2023-11-20 ENCOUNTER — Ambulatory Visit (INDEPENDENT_AMBULATORY_CARE_PROVIDER_SITE_OTHER): Admitting: Family Medicine

## 2023-11-20 VITALS — BP 148/64 | HR 78 | Temp 97.3°F | Ht 62.0 in | Wt 135.0 lb

## 2023-11-20 DIAGNOSIS — R251 Tremor, unspecified: Secondary | ICD-10-CM | POA: Diagnosis not present

## 2023-11-20 DIAGNOSIS — M542 Cervicalgia: Secondary | ICD-10-CM

## 2023-11-20 DIAGNOSIS — R2681 Unsteadiness on feet: Secondary | ICD-10-CM | POA: Diagnosis not present

## 2023-11-20 DIAGNOSIS — F439 Reaction to severe stress, unspecified: Secondary | ICD-10-CM

## 2023-11-20 MED ORDER — ALLOPURINOL 100 MG PO TABS: ORAL_TABLET | ORAL | 3 refills | Status: AC

## 2023-11-20 MED ORDER — PREGABALIN 25 MG PO CAPS: 25.0000 mg | ORAL_CAPSULE | Freq: Two times a day (BID) | ORAL | 5 refills | Status: DC

## 2023-11-20 MED ORDER — ALPRAZOLAM 1 MG PO TABS: ORAL_TABLET | ORAL | 1 refills | Status: DC

## 2023-11-20 NOTE — Patient Instructions (Addendum)
 We will work on setting her up with neurology We will also set up the MRI of the brain In addition to this Lyrica for the pinched nerve in your neck 1 tablet in the evening for the first 3 days then 1 tablet twice daily Please keep your follow-up in August We will let you know when we hear back from the neurologist as well as the MRI Thanks-Dhruvan Gullion

## 2023-11-20 NOTE — Progress Notes (Signed)
 Subjective:    Patient ID: Terri Douglas, female    DOB: Nov 16, 1945, 78 y.o.   MRN: [id number]  HPI  Left shoulder pain  Balance issues  Tremors  Discussed the use of AI scribe software for clinical note transcription with the patient, who gave verbal consent to proceed.  History of Present Illness   Terri Douglas is a 78 year old female who presents with worsening tremors and shoulder pain.  She has been experiencing worsening tremors for an unspecified duration, affecting both hands. The tremors are particularly noticeable during fine motor tasks such as signing her name, picking up objects, and using utensils. She also has difficulty with balance and walking, feeling 'shaky' and uncertain with her steps.  She has had shoulder pain for the past four weeks, described as constant and not relieved by movement. The pain is accompanied by numbness and tingling down her arm. She mentions a history of hitting her arm, which resulted in bruising. The pain has not improved over time and affects her sleep, as she is not sleeping well at all.  She is currently taking a low dose of elavil   medication prescribed by Orelia Binet, but has concerns about its side effects, particularly given her age. She also takes Xanax , which she recently refilled.  She describes a high level of stress related to her personal circumstances, including caring for a family member, which she feels is impacting her health. She feels overwhelmed and under significant stress, which she believes is contributing to her symptoms.        Review of Systems     Objective:   Physical Exam  General-in no acute distress Eyes-no discharge Lungs-respiratory rate normal, CTA CV-no murmurs,RRR Extremities skin warm dry no edema Neuro grossly normal Behavior normal, alert Patient's walk is relatively slow somewhat wide-based, slower than what I would expect for this patient as well as somewhat unsteady Romberg she wavers but does  not fall Significant tremor in both hands at rest and even with activity there is some level of cogwheeling noted in the left arm      Assessment & Plan:  Assessment and Plan    Tremor Worsening bilateral hand tremor during intentional movements suggests a movement disorder. Possible early Parkinson's disease or severe intentional tremor considered. Gait abnormalities noted. - Refer to neurologist specializing in movement disorders. - Order MRI of the brain. - Consider medication for tremor management post-neurologist consultation.  Pinched Nerve in Neck Pain and numbness radiating from neck to arm consistent with pinched nerve. Persistent for four weeks without improvement. - Prescribe Lyrica, start with one tablet in the evening for three days, then one tablet twice daily. - Consider MRI of the neck if symptoms persist. - Monitor response to medication and adjust dosage as needed.  Hypertension Blood pressure elevated at 148/?, possibly related to pain and stress. - Monitor blood pressure. - Reassess blood pressure management at follow-up.  Stress Management Significant stress impacting health. - Encourage stress management techniques. - Discuss support systems and potential social support options.       1. Tremor of both hands (Primary) I am concerned about the possibility of Parkinson's I will reach out to Dr.Tat No sign of any muscle weakness   2. Unsteady gait May be related to the tremor But with her unsteady gait her having significant balance issues at home for no good reason we will do an MRI to rule out possibility of stroke and also rule out the possibility  of HIDA pressure normal esophagus  3. Neck pain on left side Has symptoms consistent with a nerve root impingement if not better over the course of the next few weeks-6 to 8 weeks we will pursue imaging Recommend pregabalin 25 mg twice daily Patient to give us  feedback within the next 2 weeks how this is  doing  4. Stress Having a significant mount of stress managing her husband hopefully he can get some additional assistance through family  Will reach out to neurology regarding this patient

## 2023-11-21 ENCOUNTER — Encounter: Payer: Self-pay | Admitting: Neurology

## 2023-11-21 ENCOUNTER — Other Ambulatory Visit: Payer: Self-pay | Admitting: Family Medicine

## 2023-11-21 ENCOUNTER — Encounter: Payer: Self-pay | Admitting: Family Medicine

## 2023-11-21 MED ORDER — ALPRAZOLAM 1 MG PO TABS: ORAL_TABLET | ORAL | 1 refills | Status: DC

## 2023-11-21 MED ORDER — PREGABALIN 25 MG PO CAPS: 25.0000 mg | ORAL_CAPSULE | Freq: Two times a day (BID) | ORAL | 5 refills | Status: DC

## 2023-11-23 ENCOUNTER — Encounter: Payer: Self-pay | Admitting: Neurology

## 2023-11-23 ENCOUNTER — Ambulatory Visit (INDEPENDENT_AMBULATORY_CARE_PROVIDER_SITE_OTHER): Admitting: Neurology

## 2023-11-23 VITALS — BP 132/78 | HR 89 | Ht 62.0 in | Wt 134.6 lb

## 2023-11-23 DIAGNOSIS — F411 Generalized anxiety disorder: Secondary | ICD-10-CM

## 2023-11-23 DIAGNOSIS — R251 Tremor, unspecified: Secondary | ICD-10-CM

## 2023-11-23 NOTE — Patient Instructions (Signed)
 It was good to see you!  You decided to hold off on medication right now for tremor.  We did discuss that I will look at your MRI brain and that I would like to follow up with you in 9 months.  The physicians and staff at [facility] are committed to providing excellent care. You may receive a survey requesting feedback about your experience at our office. We strive to receive very good responses to the survey questions. If you feel that your experience would prevent you from giving the office a very good  response, please contact our office to try to remedy the situation. We may be reached at [phone]. Thank you for taking the time out of your busy day to complete the survey.

## 2023-11-23 NOTE — Progress Notes (Signed)
 Assessment/Plan:   1.  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  Technically, to have this diagnosis, one must have tremor for 3 years, and her tremor has not been present that long.  Nonetheless, the tremor itself has qualities of essential tremor.  We discussed nature and pathophysiology.  We discussed various treatment options, but at this point in time, she wants to hold on those.  I think that is reasonable.  - She did not meet [place] brain bank criteria for the diagnosis of Parkinsons disease.  She and I discussed this today.  That being said, I did tell her that this did not mean that this could not develop in the future.  She does have some decreased arm swing on the left and mild dragging of the leg on the left.  This is something that we will certainly watch out for.  However, there was no rigidity and otherwise no bradykinesia.  I am interested in seeing her MRI of the brain that was ordered by her primary care physician and is to be done early next week.   2.  GAD  -pt clearly under tremendous stress with GAD.  She asks me what she can do for her worry.  She is seeing counselor.  Discussed psychiatry as well but she feels comfortable with Dr. Fairy Homer undertaking this part of her care.  We did discuss that anxiety certainly can increase tremor.  3.  I asked the patient if she would mind following with me at least 1 more time.  She was agreeable to that.  Certainly, if symptoms arise before that follow-up in the next 6 to 9 months, she is to call me.  Subjective:   Terri Douglas was seen today in the movement disorders clinic for neurologic consultation at the request of Luking, Jackelyn Marvel, MD.  The consultation is for the evaluation of tremor in the bilateral upper extremity and balance change.  Dr. Fairy Homer personally reached out to me about this patient a few days ago.  He did order an MRI brain which is pending for 11/27/23.  Tremor: Yes.     How long has it been going  on? 6 months - 1 year  At rest or with activation?  activation  When is it noted the most?  drinking  Fam hx of tremor?  No.  Located where?  Bilateral UE now - started on the L hand but R hand started 3 weeks ago; she is R handed  Affected by caffeine:  doesn't drink enough to know  Affected by alcohol:  doesn't drink any  Affected by stress:  I don't know; I am stressed from the time I get up to the time I go to bed.  she is under a lot of stress as she is currently caregiving  Affected by fatigue:  No.  Spills cereal if on spoon:  No.  Spills glass of liquid if full:  No.  Affects ADL's (tying shoes, brushing teeth, etc):  No.  Tremor inducing meds:  No.  Other Specific Symptoms:  Voice: she's more soft than in the past and saw ENT in [place] some years ago Sleep: trouble staying asleep  Vivid Dreams:  Yes.    Acting out dreams:  No. Wet Pillows: No. Postural symptoms:  Yes.  , she attributes to hip replacement sx in Jan 2025  Falls?  No. Bradykinesia symptoms: difficulty getting out of a chair (attributes to the hip); drags feet some  with ambulation Loss of smell:  No. Loss of taste:  No. Urinary Incontinence:  No. Difficulty Swallowing:  No. Handwriting, micrographia: Yes.   Trouble with ADL's:  No.  Trouble buttoning clothing: No. Depression:  No. But admits to anxiety and I worry entirely too much Memory changes:  No. N/V:  No. Lightheaded:  No.  Syncope: No. Diplopia:  No. Dyskinesia:  No.    ALLERGIES:   Allergies  Allergen Reactions   Ibuprofen Hives   Phenobarbital Other (See Comments)    Causes blurred vision    CURRENT MEDICATIONS:  Current Outpatient Medications  Medication Instructions   allopurinol  (ZYLOPRIM ) 100 MG tablet TAKE 1 TABLET (100 MG TOTAL) BY MOUTH DAILY.   ALPRAZolam  (XANAX ) 1 MG tablet 1 qhs   Biotin 5,000 mcg, Daily   colchicine  0.6 MG tablet Day 1: 1.2 mg at the first sign of flare, followed by 0.6 mg after 1 hour; Day 2 and  thereafter: 0.6 mg twice daily until flare resolves.   cyanocobalamin  (VITAMIN B12) 1,000 mcg, Oral, Daily   esomeprazole  (NEXIUM ) 40 MG capsule TAKE ONE CAPSULE (40MG  TOTAL) BY MOUTH DAILY AT 12 NOON   estradiol (ESTRACE) 2 mg, Daily   finasteride  (PROSCAR ) 2.5 mg, Daily   furosemide  (LASIX ) 20 mg, Oral, Daily   loperamide  (IMODIUM  A-D) 2 mg, Oral, 4 times daily PRN   metoprolol  succinate (TOPROL  XL) 50 mg, Oral, Daily at bedtime   Polyethyl Glycol-Propyl Glycol (LUBRICANT EYE DROPS) 0.4-0.3 % SOLN 1-2 drops, Daily PRN   potassium chloride  (KLOR-CON ) 10 MEQ tablet 1 bid   pregabalin (LYRICA) 25 mg, Oral, 2 times daily   Probiotic Product (PHILLIPS COLON HEALTH PO) 1 capsule, Daily PRN   rosuvastatin  (CRESTOR ) 20 mg, Oral, Daily   sertraline  (ZOLOFT ) 100 MG tablet 1-1/2 tablet daily   triamcinolone  ointment (KENALOG ) 0.5 % 1 Application, Topical, 2 times daily    Objective:   PHYSICAL EXAMINATION:    VITALS:   Vitals:   11/23/23 0949  BP: 132/78  Pulse: 89  SpO2: 98%  Weight: 134 lb 9.6 oz (61.1 kg)  Height: 5' 2 (1.575 m)    GEN:  The patient appears stated age and is in NAD. HEENT:  Normocephalic, atraumatic.  The mucous membranes are moist. The superficial temporal arteries are without ropiness or tenderness. CV:  RRR Lungs:  CTAB Neck/HEME:  There are no carotid bruits bilaterally.  Neurological examination:  Orientation: The patient is alert and oriented x3.  Cranial nerves: There is good facial symmetry.  Extraocular muscles are intact. The visual fields are full to confrontational testing. The speech is fluent and clear. Soft palate rises symmetrically and there is no tongue deviation. Hearing is intact to conversational tone. Sensation: Sensation is intact to light touch throughout (facial, trunk, extremities). Vibration is intact at the bilateral big toe. There is no extinction with double simultaneous stimulation.  Motor: Strength is 5/5 in the bilateral upper and  lower extremities.   Shoulder shrug is equal and symmetric.  There is no pronator drift. Deep tendon reflexes: Deep tendon reflexes are 2/4 at the bilateral biceps, triceps, brachioradialis, patella and achilles. Plantar responses are downgoing bilaterally.  Movement examination: Tone: There is nl tone in the bilateral upper extremities.  The tone in the lower extremities is nl.  Abnormal movements: there is no rest tremor.  There is no postural tremor.  There is intention tremor bilaterally, L>R, overall mild bilateral.  She does not spill water  when pouring it from 1 glass  to another, but tremor is present when the full glass of water  is held in the left hand.  She does fairly well with Archimedes spirals bilaterally.     Coordination:  There is no decremation with RAM's, with any form of RAMS, including alternating supination and pronation of the forearm, hand opening and closing, finger taps, heel taps and toe taps.   She does have some mild slowness with foot taps on the L but no decremation Gait and Station: The patient has no difficulty arising out of a deep-seated chair without the use of the hands. The patient's stride length is good with decreased arm swing on the L and slight drag of the L leg.   I have reviewed and interpreted the following labs independently   Chemistry      Component Value Date/Time   NA 133 (L) 10/05/2023 1614   K 4.5 10/05/2023 1614   CL 93 (L) 10/05/2023 1614   CO2 24 10/05/2023 1614   BUN 21 10/05/2023 1614   CREATININE 1.29 (H) 10/05/2023 1614   CREATININE 0.93 01/28/2021 1106      Component Value Date/Time   CALCIUM  8.9 10/05/2023 1614   ALKPHOS 91 09/20/2023 0810   AST 22 09/20/2023 0810   ALT 18 09/20/2023 0810   BILITOT 0.3 09/20/2023 0810      Lab Results  Component Value Date   TSH 3.440 06/26/2017   Lab Results  Component Value Date   WBC 7.3 10/05/2023   HGB 12.1 10/05/2023   HCT 38.4 10/05/2023   MCV 92 10/05/2023   PLT 293  10/05/2023      Total time spent on today's visit was 60 minutes, including both face-to-face time and nonface-to-face time.  Time included that spent on review of records (prior notes available to me/labs/imaging if pertinent), discussing treatment and goals, answering patient's questions and coordinating care.  Cc:  Bennet Brasil, MD

## 2023-11-27 ENCOUNTER — Ambulatory Visit (HOSPITAL_COMMUNITY)
Admission: RE | Admit: 2023-11-27 | Discharge: 2023-11-27 | Disposition: A | Discharge status: Home / Self Care | Source: Ambulatory Visit | Attending: Family Medicine | Admitting: Family Medicine

## 2023-11-27 DIAGNOSIS — R251 Tremor, unspecified: Secondary | ICD-10-CM | POA: Insufficient documentation

## 2023-11-27 DIAGNOSIS — G319 Degenerative disease of nervous system, unspecified: Secondary | ICD-10-CM | POA: Diagnosis not present

## 2023-11-27 DIAGNOSIS — I6782 Cerebral ischemia: Secondary | ICD-10-CM | POA: Diagnosis not present

## 2023-11-27 DIAGNOSIS — R2681 Unsteadiness on feet: Secondary | ICD-10-CM | POA: Diagnosis not present

## 2023-11-29 ENCOUNTER — Ambulatory Visit: Admitting: Neurology

## 2023-12-07 ENCOUNTER — Ambulatory Visit: Payer: Self-pay | Admitting: Family Medicine

## 2023-12-12 DIAGNOSIS — I10 Essential (primary) hypertension: Secondary | ICD-10-CM | POA: Diagnosis not present

## 2023-12-12 DIAGNOSIS — R768 Other specified abnormal immunological findings in serum: Secondary | ICD-10-CM | POA: Diagnosis not present

## 2023-12-12 DIAGNOSIS — N1831 Chronic kidney disease, stage 3a: Secondary | ICD-10-CM | POA: Diagnosis not present

## 2024-01-02 ENCOUNTER — Encounter

## 2024-01-02 ENCOUNTER — Inpatient Hospital Stay: Admit: 2024-01-02 | Payer: MEDICARE | Primary: Physician Assistant

## 2024-01-02 DIAGNOSIS — I10 Essential (primary) hypertension: Principal | ICD-10-CM

## 2024-01-02 LAB — CBC WITH AUTO DIFFERENTIAL
Basophils %: 0.6 % (ref 0.0–2.0)
Basophils Absolute: 0.04 K/UL (ref 0.00–0.10)
Eosinophils %: 2.8 % (ref 0.0–5.0)
Eosinophils Absolute: 0.2 K/UL (ref 0.00–0.40)
Hematocrit: 39.4 % (ref 35.0–45.0)
Hemoglobin: 13.4 g/dL (ref 12.0–16.0)
Immature Granulocytes %: 0.7 % — ABNORMAL HIGH (ref 0.0–0.5)
Immature Granulocytes Absolute: 0.05 K/UL — ABNORMAL HIGH (ref 0.00–0.04)
Lymphocytes %: 21.9 % (ref 21.0–52.0)
Lymphocytes Absolute: 1.54 K/UL (ref 0.90–3.60)
MCH: 31 pg (ref 24.0–34.0)
MCHC: 34 g/dL (ref 31.0–37.0)
MCV: 91.2 FL (ref 78.0–100.0)
MPV: 10.2 FL (ref 9.2–11.8)
Monocytes %: 8.4 % (ref 3.0–10.0)
Monocytes Absolute: 0.59 K/UL (ref 0.05–1.20)
Neutrophils %: 65.6 % (ref 40.0–73.0)
Neutrophils Absolute: 4.62 K/UL (ref 1.80–8.00)
Nucleated RBCs: 0 /100{WBCs}
Platelets: 242 K/uL (ref 135–420)
RBC: 4.32 M/uL (ref 4.20–5.30)
RDW: 12.8 % (ref 11.6–14.5)
WBC: 7 K/uL (ref 4.6–13.2)
nRBC: 0 K/uL (ref 0.00–0.01)

## 2024-01-02 LAB — COMPREHENSIVE METABOLIC PANEL
ALT: 19 U/L (ref 10–35)
AST: 25 U/L (ref 10–38)
Albumin/Globulin Ratio: 1.5 (ref 0.8–1.7)
Albumin: 3.9 g/dL (ref 3.4–5.0)
Alk Phosphatase: 106 U/L (ref 45–117)
Anion Gap: 13 mmol/L (ref 3.0–18.0)
BUN/Creatinine Ratio: 17 (ref 12–20)
BUN: 18 mg/dL (ref 6–23)
CO2: 28 mmol/L (ref 21–32)
Calcium: 10 mg/dL (ref 8.5–10.1)
Chloride: 95 mmol/L — ABNORMAL LOW (ref 98–107)
Creatinine: 1.03 mg/dL (ref 0.6–1.3)
Est, Glom Filt Rate: 56 ml/min/1.73m2 — ABNORMAL LOW (ref >60)
Globulin: 2.6 g/dL (ref 2.0–4.0)
Glucose: 96 mg/dL (ref 74–108)
Potassium: 3.8 mmol/L (ref 3.5–5.5)
Sodium: 136 mmol/L (ref 136–145)
Total Bilirubin: 0.6 mg/dL (ref 0.2–1.0)
Total Protein: 6.5 g/dL (ref 6.4–8.2)

## 2024-01-02 LAB — LIPID PANEL
Chol/HDL Ratio: 3.7 (ref 0–5.0)
Cholesterol, Total: 201 mg/dL
HDL: 55 mg/dL (ref 40–60)
LDL Cholesterol: 121 mg/dL — ABNORMAL HIGH (ref 0–100)
Triglycerides: 125 mg/dL (ref 0–150)
VLDL Cholesterol Calculated: 25 mg/dL

## 2024-01-02 LAB — TSH + FREE T4 PANEL
T4 Free: 1.4 ng/dL (ref 0.9–1.7)
TSH, 3rd Generation: 9.3 u[IU]/mL — ABNORMAL HIGH (ref 0.27–4.20)

## 2024-01-02 LAB — VITAMIN D 25 HYDROXY: Vit D, 25-Hydroxy: 28.8 ng/mL — ABNORMAL LOW (ref 30.0–100.0)

## 2024-01-03 ENCOUNTER — Other Ambulatory Visit: Payer: Self-pay | Admitting: Neurological Surgery

## 2024-01-03 DIAGNOSIS — Z6825 Body mass index (BMI) 25.0-25.9, adult: Secondary | ICD-10-CM | POA: Diagnosis not present

## 2024-01-03 DIAGNOSIS — G8929 Other chronic pain: Secondary | ICD-10-CM

## 2024-01-03 DIAGNOSIS — M25512 Pain in left shoulder: Secondary | ICD-10-CM | POA: Diagnosis not present

## 2024-01-15 ENCOUNTER — Ambulatory Visit
Admission: RE | Admit: 2024-01-15 | Discharge: 2024-01-15 | Disposition: A | Discharge status: Home / Self Care | Source: Ambulatory Visit | Attending: Neurological Surgery | Admitting: Neurological Surgery

## 2024-01-15 DIAGNOSIS — G8929 Other chronic pain: Secondary | ICD-10-CM

## 2024-01-16 ENCOUNTER — Encounter (HOSPITAL_COMMUNITY): Payer: Self-pay | Admitting: Occupational Therapy

## 2024-01-16 ENCOUNTER — Ambulatory Visit (HOSPITAL_COMMUNITY): Attending: Orthopedic Surgery | Admitting: Occupational Therapy

## 2024-01-16 DIAGNOSIS — R29898 Other symptoms and signs involving the musculoskeletal system: Secondary | ICD-10-CM | POA: Diagnosis not present

## 2024-01-16 DIAGNOSIS — M25512 Pain in left shoulder: Secondary | ICD-10-CM | POA: Insufficient documentation

## 2024-01-16 NOTE — Therapy (Signed)
 OUTPATIENT OCCUPATIONAL THERAPY ORTHO EVALUATION  Patient Name: Terri Douglas MRN: [id number] DOB:April 09, 1946, 78 y.o., female Today's Date: 01/16/2024   END OF SESSION:  OT End of Session - 01/16/24 1329     Visit Number 1    Number of Visits 4    Date for OT Re-Evaluation 02/16/24    Authorization Type Medicare Part A and B    OT Start Time 0927    OT Stop Time 1002    OT Time Calculation (min) 35 min    Activity Tolerance Patient tolerated treatment well    Behavior During Therapy WFL for tasks assessed/performed          Past Medical History:  Diagnosis Date   Arthritis    Complication of anesthesia    Gall stones    GERD (gastroesophageal reflux disease)    History of cardiac catheterization 2005   Minor elevation in cardiac enzymes however no significant obstructive CAD   Hyperlipidemia    Hypertension    Insomnia    PONV (postoperative nausea and vomiting)    Prediabetes    Past Surgical History:  Procedure Laterality Date   A-FLUTTER ABLATION N/A 08/01/2017   Procedure: A-FLUTTER ABLATION;  Surgeon: Kelsie Agent, MD;  Location: [facility];  Service: Cardiovascular;  Laterality: N/A;   BACK SURGERY  1992   CARDIAC CATHETERIZATION     BACK IN 2004  SHE THINKS IT CAME BACK 'NORMAL'   CHOLECYSTECTOMY N/A 06/24/2015   Procedure: LAPAROSCOPIC CHOLECYSTECTOMY;  Surgeon: Herlene Beverley Bureau, MD;  Location: [facility] OR;  Service: General;  Laterality: N/A;   COLONOSCOPY N/A 08/30/2012   Procedure: COLONOSCOPY;  Surgeon: Claudis RAYMOND Rivet, MD;  Location: [facility];  Service: Endoscopy;  Laterality: N/A;  830   DILATION AND CURETTAGE OF UTERUS     ESOPHAGOGASTRODUODENOSCOPY  04/15/2011   Procedure: ESOPHAGOGASTRODUODENOSCOPY (EGD);  Surgeon: Claudis RAYMOND Rivet, MD;  Location: [facility];  Service: Endoscopy;  Laterality: N/A;  11:30   ESOPHAGOGASTRODUODENOSCOPY N/A 05/27/2016   Procedure: ESOPHAGOGASTRODUODENOSCOPY (EGD);  Surgeon: Claudis RAYMOND Rivet, MD;   Location: [facility];  Service: Endoscopy;  Laterality: N/A;  11:15   EYE SURGERY     cataract removal   NECK SURGERY  12/13/2018   REVERSE SHOULDER ARTHROPLASTY Left 04/03/2019   Procedure: REVERSE SHOULDER ARTHROPLASTY;  Surgeon: Cristy Bonner DASEN, MD;  Location: [facility];  Service: Orthopedics;  Laterality: Left;   VAGINAL HYSTERECTOMY     Patient Active Problem List   Diagnosis Date Noted   Neck pain on left side 11/13/2023   Muscle spasm of back 11/13/2023   Osteoarthritis of spine with radiculopathy, cervical region 11/13/2023   Asymptomatic superficial varicose vein of both lower extremities 08/24/2023   Petechiae 08/08/2023   Chronic right-sided low back pain with right-sided sciatica 04/12/2023   Stage 3a chronic kidney disease (HCC) 01/16/2023   History of atrial flutter 04/20/2022   Paroxysmal SVT (supraventricular tachycardia) (HCC) 04/20/2022   Laryngopharyngeal reflux (LPR) 09/28/2020   Primary osteoarthritis of right hip 01/17/2020   Pedal edema 11/19/2019   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   Insomnia 03/15/2019   Hyperlipidemia 06/18/2014   Gout 06/18/2014   Osteopenia 10/28/2013   Hypertension 03/29/2011    PCP: Alphonsa Hamilton, MD REFERRING PROVIDER: Colon Shove, MD  ONSET DATE: ~7-8 months  REFERRING DIAG: L shoulder pain  THERAPY DIAG:  Left shoulder pain, unspecified chronicity  Other symptoms and signs involving the musculoskeletal system  Rationale for Evaluation  and Treatment: Rehabilitation  SUBJECTIVE:   SUBJECTIVE STATEMENT: Its in my shoulder blade and tingles down my arm. Pt accompanied by: self  PERTINENT HISTORY: Pt has had 4 cervical surgeries, L reverse total shoulder replacement  PRECAUTIONS: None  WEIGHT BEARING RESTRICTIONS: No  PAIN:  Are you having pain? Yes: NPRS scale: 5/10 Pain location: shoulder blade Pain description: sharp Aggravating factors: nothing Relieving factors: heat  FALLS: Has  patient fallen in last 6 months? No  PLOF: Independent  PATIENT GOALS: To reduce pain  NEXT MD VISIT: unsure  OBJECTIVE:   HAND DOMINANCE: Right  ADLs: Overall ADLs: Pt having pain and limitations with reaching behind her back. Unable to lift items above waist height without the help of her RUE. Pt has max difficulty with grooming her hair.   FUNCTIONAL OUTCOME MEASURES: Quick Dash: 68.18  UPPER EXTREMITY ROM:       Assessed in seated, er/IR adducted  Active ROM Left eval  Shoulder flexion 137  Shoulder abduction 145  Shoulder internal rotation 90  Shoulder external rotation 61  (Blank rows = not tested)    UPPER EXTREMITY MMT:     Assessed in seated, er/IR adducted  MMT Left eval  Shoulder flexion 5/5  Shoulder abduction 4+/5  Shoulder internal rotation 4+/5  Shoulder external rotation 4/5  (Blank rows = not tested)  SENSATION: Pt has tingling from her biceps into her hand with intermittent numbness in her hand  EDEMA: No swelling noted  OBSERVATIONS: Moderate fascial restrictions along the biceps, scapular region, and pectoralis, maximal restriction along the trapezius.    TODAY'S TREATMENT:                                                                                                                              DATE:   01/16/24 -Shoulder strengthening: 2-3#, flexion, abduction, protraction, horizontal abduction, er/IR, x10    PATIENT EDUCATION: Education details: Public relations account executive Person educated: Patient Education method: Explanation, Demonstration, and Handouts Education comprehension: verbalized understanding and returned demonstration  HOME EXERCISE PROGRAM: 8/12: Shoulder strengthening  GOALS: Goals reviewed with patient? Yes   SHORT TERM GOALS: Target date: 02/16/24  Pt will be provided with and educated on HEP to improve mobility in LUE required for use during ADL completion.   Goal status: INITIAL  LONG TERM GOALS: Target  date: 02/16/24  Pt will decrease pain in LUE to 3/10 or less to improve ability to sleep for 2+ consecutive hours without waking due to pain.   Goal status: INITIAL  2.  Pt will decrease LUE fascial restrictions to min amounts or less to improve mobility required for functional reaching tasks.   Goal status: INITIAL  4.  Pt will increase LUE strength to 5/5 or greater to improve ability to use LUE when lifting or carrying items during meal preparation/housework/yardwork tasks.   Goal status: INITIAL  5.  Pt will return to highest level of function using LUE as non-dominant during  functional task completion.   Goal status: INITIAL   ASSESSMENT:  CLINICAL IMPRESSION: Patient is a 78 y.o. female who was seen today for occupational therapy evaluation for L shoulder and neck pain. Pt presents with increased pain and fascial restrictions, decreased ROM, strength, and functional use of the LUE.   PERFORMANCE DEFICITS: in functional skills including in functional skills including ADLs, IADLs, coordination, tone, ROM, strength, pain, fascial restrictions, muscle spasms, and UE functional use.  IMPAIRMENTS: are limiting patient from ADLs, IADLs, rest and sleep, work, leisure, and social participation.   COMORBIDITIES: may have co-morbidities  that affects occupational performance. Patient will benefit from skilled OT to address above impairments and improve overall function.  MODIFICATION OR ASSISTANCE TO COMPLETE EVALUATION: Min-Moderate modification of tasks or assist with assess necessary to complete an evaluation.  OT OCCUPATIONAL PROFILE AND HISTORY: Detailed assessment: Review of records and additional review of physical, cognitive, psychosocial history related to current functional performance.  CLINICAL DECISION MAKING: Moderate - several treatment options, min-mod task modification necessary  REHAB POTENTIAL: Good  EVALUATION COMPLEXITY: Moderate      PLAN:  OT FREQUENCY:  1x/week  OT DURATION: 4 weeks  PLANNED INTERVENTIONS: 97168 OT Re-evaluation, 97535 self care/ADL training, 02889 therapeutic exercise, 97530 therapeutic activity, 97112 neuromuscular re-education, 97140 manual therapy, 97035 ultrasound, 97010 moist heat, 97032 electrical stimulation (manual), passive range of motion, functional mobility training, energy conservation, coping strategies training, patient/family education, and DME and/or AE instructions  RECOMMENDED OTHER SERVICES: N/A  CONSULTED AND AGREED WITH PLAN OF CARE: Patient  PLAN FOR NEXT SESSION: Manual Therapy, Strengthening, stability exercises.   Valentin Nightingale, OTR/L [facility] [phone] Valentin Jillyn Nightingale, OT 01/16/2024, 1:33 PM

## 2024-01-18 ENCOUNTER — Other Ambulatory Visit

## 2024-01-23 DIAGNOSIS — M47812 Spondylosis without myelopathy or radiculopathy, cervical region: Secondary | ICD-10-CM | POA: Diagnosis not present

## 2024-01-26 ENCOUNTER — Ambulatory Visit: Admitting: Family Medicine

## 2024-01-26 VITALS — BP 116/72 | HR 60 | Temp 98.1°F | Ht 62.0 in | Wt 141.6 lb

## 2024-01-26 DIAGNOSIS — R5383 Other fatigue: Secondary | ICD-10-CM

## 2024-01-26 DIAGNOSIS — R768 Other specified abnormal immunological findings in serum: Secondary | ICD-10-CM

## 2024-01-26 DIAGNOSIS — M4722 Other spondylosis with radiculopathy, cervical region: Secondary | ICD-10-CM | POA: Diagnosis not present

## 2024-01-26 DIAGNOSIS — M542 Cervicalgia: Secondary | ICD-10-CM

## 2024-01-26 DIAGNOSIS — N1831 Chronic kidney disease, stage 3a: Secondary | ICD-10-CM

## 2024-01-26 NOTE — Progress Notes (Addendum)
 Subjective:    Patient ID: Terri Douglas, female    DOB: 1945/06/08, 78 y.o.   MRN: [id number]  HPI Patient dealing with a lot of stress take care of her husband who has Alzheimer's Patient has ongoing pain discomfort left trapezius does not really think the Lyrica  is helping and also relates a lot of daytime fatigue  Discussed the use of AI scribe software for clinical note transcription with the patient, who gave verbal consent to proceed.  History of Present Illness   Terri Douglas is a 78 year old female who presents with tingling and pain in her arm and back.  She experiences tingling and burning sensations primarily in her arm, which sometimes extends to a burning feeling. Pain is also present in her back, specifically around the shoulder blade area. Symptoms worsen when resting on the sofa, prompting her to change positions for relief. Tingling is more prominent than the burning sensation.  She has been taking pregabalin  25 mg twice a day since June to help with the burning and discomfort, but she is unsure if it is effective. The medication might be causing drowsiness in the afternoons, making it difficult to keep her eyes open.  She reports that her MRI results mentioned bone spurs and narrowing where the nerves exit the neck, though she did not fully understand the report. She also experiences difficulty with her left arm not moving as much as her right when walking, which was pointed out by a neurologist. She has noticed a shuffling gait and her left arm hanging while walking.  She recalls having hip surgery in January, prior to which she experienced similar symptoms. She also mentions a history of arthritis, with a positive ANA test indicating possible arthritis conditions. She is awaiting a consultation with a rheumatologist for further evaluation.  She saw a kidney doctor in early July and was supposed to have an ultrasound, which has not yet been completed. She underwent blood  and urine tests, although there was a delay in completing the urine test due to timing issues at the lab.      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 Severe left and moderate right facet arthropathy noted. No significant weakness observed. - Discontinue pregabalin  to assess contribution to drowsiness. - Monitor symptoms, consider injections if pain or tingling worsens. - Contact neurosurgeon if symptoms persist.  Arthritis with positive antinuclear antibody (ANA) Positive ANA test associated with arthritis conditions. - Refer to rheumatologist for further evaluation.  Chronic kidney disease Chronic kidney disease with slightly elevated kidney function tests, not requiring dialysis. Emphasized maintaining healthy lifestyle, diet, and blood pressure control. - Order renal ultrasound. - Schedule follow-up with nephrologist in November.  .diag 1. Stage 3a chronic kidney disease (HCC) (Primary) Ultrasound ordered She saw [facility] who wanted ultrasound but apparently it never got ordered by the nephrology We will go ahead and order this to look at kidney size will share results with [facility] - [place]  Renal; Future  2. Osteoarthritis of spine  with radiculopathy, cervical region Significant arthritis in her spine also positive ANA along with dry eyes, kidney doctor was concerned about the possibility of Sojourn syndrome and wanted her referred to  rheumatology for further evaluation - Ambulatory referral to Rheumatology  3. Other fatigue I think this is multifactorial the biggest reason with her husband has advancing dementia which puts a strain on her  4. Neck pain on left side Foraminal impingement causing this Lyrica  not helping she relates daytime tiredness and fatigue therefore stopped Lyrica  see if that helps  We will communicate with Dr. Thayer group to asked that they reach out to her to follow-up on her MRI plus also do a follow-up visit with her  5. Positive ANA (antinuclear antibody) Referral to rheumatology as stated above  I did review the MRI that neurosurgery did in August-patient requested that I review it with her-it shows multiple levels of foraminal stenosis and bone spurs but I am not quite sure that there is a surgical solution but I did tell the patient that is very important for her to do a follow-up visit with neurosurgery to discuss choices.  I did not find any muscle weakness on today's exam

## 2024-01-29 ENCOUNTER — Encounter (HOSPITAL_COMMUNITY): Admitting: Occupational Therapy

## 2024-01-31 ENCOUNTER — Ambulatory Visit (HOSPITAL_COMMUNITY)
Admission: RE | Admit: 2024-01-31 | Discharge: 2024-01-31 | Disposition: A | Discharge status: Home / Self Care | Source: Ambulatory Visit | Attending: Family Medicine | Admitting: Family Medicine

## 2024-01-31 DIAGNOSIS — N1831 Chronic kidney disease, stage 3a: Secondary | ICD-10-CM | POA: Diagnosis not present

## 2024-01-31 DIAGNOSIS — M5412 Radiculopathy, cervical region: Secondary | ICD-10-CM | POA: Diagnosis not present

## 2024-01-31 DIAGNOSIS — Z6825 Body mass index (BMI) 25.0-25.9, adult: Secondary | ICD-10-CM | POA: Diagnosis not present

## 2024-02-02 ENCOUNTER — Ambulatory Visit: Payer: MEDICARE | Attending: Cardiovascular Disease | Primary: Physician Assistant

## 2024-02-02 NOTE — Progress Notes (Unsigned)
 HISTORY OF PRESENT ILLNESS  Krystal Hood is a 78 y.o. female.    PMH HTN, OSA on CPAP  ----  CARDIAC STUDIES  ----  2d echo 01/04/2023    Left Ventricle: Normal left ventricular systolic function with a visually estimated EF of 55 - 60%. Left ventricle size is normal. Normal wall thickness. Normal wall motion. Normal diastolic function.    Tricuspid Valve: Unable to assess RVSP due to inadequate or insignificant tricuspid regurgitation.    Aorta: Normal sized aortic root. Mildly dilated ascending aorta. Ao ascending diameter is 3.5 cm.    Image quality is adequate.  ----  Heart monitor ziopatch 09/2022  SVT present with heart rate 126bpm to 140bpm  PACs occasional and PVC rare  167 SVT runs with the fastest interval lasting 2 minutes 56 seconds and max rate of 200bpm longest lasting 45 minutes 1 second with average rate of 133bpm, idioventricular rhythm.    ----  2d echo 09/2016  Stress 2D echocardiographic results  Baseline: Left ventricular size was normal. Overall left ventricular systolic   function was normal. Estimated left  ventricular ejection fraction was 60 % .     Peak stress: LV size was unchanged from the previous stage to mildly dilated   in some views. There was no change in LV  function since the previous stage with some hypokinesia in the inferior wall  ----  CTA of the chest 09/13/2022  No evidence of pulmonary embolism or other acute process.   ----  Duplex renal arteries 09/14/2016  INTERPRETATION/FINDINGS   Duplex images were obtained using 2-D gray scale, color flow, and   spectral Doppler analysis.   RENAL:   1. No significant renal artery stenosis identified, both renal   arteries visualized.   2. The right kidney measures 10.3 cm.   3. The left kidney measures 10.6 cm.   4. The renal vein is patent bilaterally.   5.  No evidence of aneurysm noted in the abdominal aorta.   ----    Have you had Fatigue?  No     2.   Have you had have you had Chest Pain? No      3.   Have you had Dyspnea (SOB) ? No    4.   Have you had Orthopnea? No   5.   Have you had PND? No   6.   Have you had leg swelling? Yes ankles at night  7.    Have you had any weight gain? No   8. Have you had any palpitations? No     9. Have you had any syncope? No   10. Do you have any wounds on legs?no       Reviewed with the patient and is as such.      Family History   Problem Relation Age of Onset    Cancer Mother 18        lymphoma    Heart Attack Father     Heart Attack Sister        Past Medical History:   Diagnosis Date    Hypertension     Thyroid disease     hypothyroidism    Unspecified sleep apnea     Doesn't use CPAP       Past Surgical History:   Procedure Laterality Date    KNEE ARTHROSCOPY Right     OTHER SURGICAL HISTORY      Fatty removed from left ankle  TONSILLECTOMY AND ADENOIDECTOMY      TUBAL LIGATION         Social History     Tobacco Use    Smoking status: Former    Smokeless tobacco: Never   Substance Use Topics    Alcohol use: No       Allergies   Allergen Reactions    Hydromorphone Nausea And Vomiting    Meperidine Nausea And Vomiting       Prior to Admission medications   Medication Sig Start Date End Date Taking? Authorizing Provider   hydrALAZINE  (APRESOLINE ) 50 MG tablet Take 1 tablet by mouth 3 times daily 08/15/23   Ivery Debby SQUIBB, MD   atorvastatin (LIPITOR) 40 MG tablet Take 1 tablet by mouth daily    [provider]   chlorthalidone (HYGROTON) 25 MG tablet Take 1 tablet by mouth daily    [provider]   losartan (COZAAR) 100 MG tablet Take 1 tablet by mouth daily    [provider]   sertraline (ZOLOFT) 100 MG tablet Take 1 tablet by mouth daily    [provider]   metoprolol succinate (TOPROL XL) 25 MG extended release tablet Take 1 tablet by mouth daily    [provider]   Levothyroxine Sodium 112 MCG CAPS Take 112 mcg by mouth Daily    Automatic Reconciliation, Ar       No results found for: LIPIDPAN, BMP, CMP     There were no vitals taken for this  visit.    HPI    Review of Systems   Constitutional:  Negative for activity change, appetite change, diaphoresis, fatigue and unexpected weight change.   Eyes:  Negative for visual disturbance.   Respiratory:  Negative for apnea, cough, chest tightness, shortness of breath and wheezing.    Cardiovascular:  Negative for chest pain, palpitations and leg swelling.   Gastrointestinal:  Negative for abdominal pain, blood in stool, constipation, diarrhea and nausea.   Endocrine: Negative for cold intolerance and heat intolerance.   Genitourinary:  Negative for decreased urine volume and difficulty urinating.   Musculoskeletal:  Negative for gait problem, myalgias and neck pain.   Skin:  Negative for color change, pallor and rash.   Neurological:  Negative for dizziness, syncope, facial asymmetry, speech difficulty, weakness, light-headedness and numbness.   Psychiatric/Behavioral:  Negative for confusion and sleep disturbance.          Physical Exam  Vitals reviewed.   Constitutional:       General: She is not in acute distress.     Appearance: Normal appearance. She is not ill-appearing or diaphoretic.   HENT:      Head: Normocephalic and atraumatic.   Eyes:      General: No scleral icterus.        Right eye: No discharge.         Left eye: No discharge.      Conjunctiva/sclera: Conjunctivae normal.   Neck:      Vascular: No carotid bruit.   Cardiovascular:      Rate and Rhythm: Normal rate and regular rhythm.      Heart sounds: Normal heart sounds. No murmur heard.     No friction rub. No gallop.   Pulmonary:      Effort: Pulmonary effort is normal. No respiratory distress.      Breath sounds: Normal breath sounds. No wheezing, rhonchi or rales.   Chest:      Chest wall: No tenderness.  Abdominal:      General: Bowel sounds are normal. There is no distension.      Palpations: Abdomen is soft.      Tenderness: There is no abdominal tenderness. There is no guarding.   Musculoskeletal:         General: No swelling or  tenderness.      Cervical back: Neck supple.      Right lower leg: No edema.      Left lower leg: No edema.   Skin:     General: Skin is warm and dry.      Findings: No erythema or rash.   Neurological:      Mental Status: She is alert. Mental status is at baseline.      Motor: No weakness.      Gait: Gait normal.   Psychiatric:         Mood and Affect: Mood normal.         Behavior: Behavior normal.         Thought Content: Thought content normal.         ASSESSMENT and PLAN  Ms. Nwosu has a reminder for a due or due soon health maintenance. I have asked that she contact her primary care prov  ider for follow-up on this health maintenance.    SVT - Continue with metoprolol succinate 25mg  po daily, states that palpitations well controlled.  Without atrial fibrillation present, can discontinue eliquis.  Patient presented picture of monitor that shows atrial tachycardia at 127bpm that was regular from the emergency department.  2D echo 12/2022 with preserved ejection fraction 55 to 60 percent.      HTN - Stage II pressure, continue with metoprolol succinate 25mg  po daily, continue with losartan 100mg  po daily, continue chlorthalidone 76m mg po daily, Continue hydralazine  but increase 50mg  po TID evaluate if can tolerate.      HLD - Continue with lipitor 40mg  po daily, survey lipid panel periodically.      OSA on CPAP

## 2024-02-06 ENCOUNTER — Encounter (HOSPITAL_COMMUNITY): Admitting: Occupational Therapy

## 2024-02-08 ENCOUNTER — Encounter: Payer: Self-pay | Admitting: Pulmonary Disease

## 2024-02-12 ENCOUNTER — Ambulatory Visit: Payer: Self-pay | Admitting: Family Medicine

## 2024-02-12 ENCOUNTER — Encounter (HOSPITAL_COMMUNITY): Admitting: Occupational Therapy

## 2024-02-19 ENCOUNTER — Encounter (HOSPITAL_COMMUNITY): Admitting: Occupational Therapy

## 2024-03-22 ENCOUNTER — Other Ambulatory Visit: Payer: Self-pay | Admitting: Family Medicine

## 2024-03-22 ENCOUNTER — Encounter: Payer: Self-pay | Admitting: Emergency Medicine

## 2024-03-22 ENCOUNTER — Ambulatory Visit: Payer: Self-pay

## 2024-03-22 ENCOUNTER — Ambulatory Visit
Admission: EM | Admit: 2024-03-22 | Discharge: 2024-03-22 | Disposition: A | Discharge status: Home / Self Care | Attending: Family Medicine | Admitting: Family Medicine

## 2024-03-22 DIAGNOSIS — M79601 Pain in right arm: Secondary | ICD-10-CM

## 2024-03-22 MED ORDER — TIZANIDINE HCL 4 MG PO CAPS
4.0000 mg | ORAL_CAPSULE | Freq: Three times a day (TID) | ORAL | 0 refills | Status: DC | PRN
Start: 2024-03-22 — End: 2024-04-05

## 2024-03-22 NOTE — Discharge Instructions (Signed)
 I suspect that you have strained your bicep muscle.  In addition to heat, muscle rubs, massage and gentle stretches I have prescribed a muscle relaxer.  Take Tylenol  as needed for pain.  Follow-up for worsening or unresolving symptoms.

## 2024-03-22 NOTE — ED Triage Notes (Signed)
 Right upper arm pain x 3 days.  Denies any injury.  States hurts to move the arm and to get dressed.

## 2024-03-22 NOTE — ED Provider Notes (Signed)
 [facility]    CSN: [id number] Arrival date & time: 03/22/24  1633      History   Chief Complaint No chief complaint on file.   HPI Terri Douglas is a 78 y.o. female.   Patient presenting today with right upper arm pain for the past 3 days.  Denies known injury to the area, swelling, bruising, discoloration, numbness, tingling, weakness.  States the area is localized to an area just above the elbow.  So far tried Tylenol  with minimal relief.    Past Medical History:  Diagnosis Date   Arthritis    Complication of anesthesia    Gall stones    GERD (gastroesophageal reflux disease)    History of cardiac catheterization 2005   Minor elevation in cardiac enzymes however no significant obstructive CAD   Hyperlipidemia    Hypertension    Insomnia    PONV (postoperative nausea and vomiting)    Prediabetes     Patient Active Problem List   Diagnosis Date Noted   Neck pain on left side 11/13/2023   Muscle spasm of back 11/13/2023   Osteoarthritis of spine with radiculopathy, cervical region 11/13/2023   Asymptomatic superficial varicose vein of both lower extremities 08/24/2023   Petechiae 08/08/2023   Chronic right-sided low back pain with right-sided sciatica 04/12/2023   Stage 3a chronic kidney disease (HCC) 01/16/2023   History of atrial flutter 04/20/2022   Paroxysmal SVT (supraventricular tachycardia) 04/20/2022   Laryngopharyngeal reflux (LPR) 09/28/2020   Primary osteoarthritis of right hip 01/17/2020   Pedal edema 11/19/2019   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   Insomnia 03/15/2019   Hyperlipidemia 06/18/2014   Gout 06/18/2014   Osteopenia 10/28/2013   Hypertension 03/29/2011    Past Surgical History:  Procedure Laterality Date   A-FLUTTER ABLATION N/A 08/01/2017   Procedure: A-FLUTTER ABLATION;  Surgeon: Kelsie Agent, MD;  Location: [facility];  Service: Cardiovascular;  Laterality: N/A;   BACK SURGERY  1992    CARDIAC CATHETERIZATION     BACK IN 2004  SHE THINKS IT CAME BACK 'NORMAL'   CHOLECYSTECTOMY N/A 06/24/2015   Procedure: LAPAROSCOPIC CHOLECYSTECTOMY;  Surgeon: Herlene Beverley Bureau, MD;  Location: [facility] OR;  Service: General;  Laterality: N/A;   COLONOSCOPY N/A 08/30/2012   Procedure: COLONOSCOPY;  Surgeon: Claudis RAYMOND Rivet, MD;  Location: [facility];  Service: Endoscopy;  Laterality: N/A;  830   DILATION AND CURETTAGE OF UTERUS     ESOPHAGOGASTRODUODENOSCOPY  04/15/2011   Procedure: ESOPHAGOGASTRODUODENOSCOPY (EGD);  Surgeon: Claudis RAYMOND Rivet, MD;  Location: [facility];  Service: Endoscopy;  Laterality: N/A;  11:30   ESOPHAGOGASTRODUODENOSCOPY N/A 05/27/2016   Procedure: ESOPHAGOGASTRODUODENOSCOPY (EGD);  Surgeon: Claudis RAYMOND Rivet, MD;  Location: [facility];  Service: Endoscopy;  Laterality: N/A;  11:15   EYE SURGERY     cataract removal   NECK SURGERY  12/13/2018   REVERSE SHOULDER ARTHROPLASTY Left 04/03/2019   Procedure: REVERSE SHOULDER ARTHROPLASTY;  Surgeon: Cristy Bonner DASEN, MD;  Location: [facility];  Service: Orthopedics;  Laterality: Left;   VAGINAL HYSTERECTOMY      OB History   No obstetric history on file.      Home Medications    Prior to Admission medications   Medication Sig Start Date End Date Taking? Authorizing Provider  tiZANidine  (ZANAFLEX ) 4 MG capsule Take 1 capsule (4 mg total) by mouth 3 (three) times daily as needed for muscle spasms. Do not drink alcohol or drive while  taking this medication.  May cause drowsiness. 03/22/24  Yes Stuart Vernell Norris, [name]  allopurinol  (ZYLOPRIM ) 100 MG tablet TAKE 1 TABLET (100 MG TOTAL) BY MOUTH DAILY. 11/20/23   Alphonsa Glendia LABOR, MD  ALPRAZolam  (XANAX ) 1 MG tablet 1 qhs 11/21/23   Alphonsa Glendia LABOR, MD  Biotin 5000 MCG TABS Take 5,000 mcg by mouth daily. Patient not taking: Reported on 11/23/2023    [provider]  colchicine  0.6 MG tablet Day 1: 1.2 mg at the first sign of flare, followed by 0.6 mg after 1 hour;  Day 2 and thereafter: 0.6 mg twice daily until flare resolves. 03/24/22   Alphonsa Glendia LABOR, MD  cyanocobalamin  (VITAMIN B12) 1000 MCG tablet Take 1,000 mcg by mouth daily.    [provider]  esomeprazole  (NEXIUM ) 40 MG capsule TAKE ONE CAPSULE (40MG  TOTAL) BY MOUTH DAILY AT 12 NOON 06/05/23   Luking, Scott A, MD  estradiol (ESTRACE) 2 MG tablet Take 2 mg by mouth daily.  08/01/18   [provider]  finasteride  (PROSCAR ) 5 MG tablet Take 2.5 mg by mouth daily.  08/27/15   [provider]  furosemide  (LASIX ) 20 MG tablet Take 1 tablet (20 mg total) by mouth daily. 09/25/23   Alphonsa Glendia LABOR, MD  loperamide  (IMODIUM  A-D) 2 MG tablet Take 1 tablet (2 mg total) by mouth 4 (four) times daily as needed for diarrhea or loose stools. 11/22/20   Pearlean Manus, MD  metoprolol  succinate (TOPROL  XL) 50 MG 24 hr tablet Take 1 tablet (50 mg total) by mouth at bedtime. 09/22/23   Alphonsa Glendia LABOR, MD  Polyethyl Glycol-Propyl Glycol (LUBRICANT EYE DROPS) 0.4-0.3 % SOLN Place 1-2 drops into both eyes daily as needed (dry/irritated eyes.).    [provider]  potassium chloride  (KLOR-CON ) 10 MEQ tablet 1 bid Patient taking differently: Take 10 mEq by mouth 2 (two) times daily. 1 bid 09/22/23   Alphonsa Glendia LABOR, MD  Probiotic Product (PHILLIPS COLON HEALTH PO) Take 1 capsule by mouth daily as needed (irritable bowel).    [provider]  rosuvastatin  (CRESTOR ) 20 MG tablet Take 1 tablet (20 mg total) by mouth daily. 09/22/23   Alphonsa Glendia LABOR, MD  sertraline  (ZOLOFT ) 100 MG tablet 1-1/2 tablet daily 09/22/23   Alphonsa Glendia LABOR, MD  triamcinolone  ointment (KENALOG ) 0.5 % Apply 1 Application topically 2 (two) times daily. 08/08/23   Cook, Jayce G, DO    Family History Family History  Problem Relation Age of Onset   Bone cancer Mother    Cancer Mother    Prostate cancer Father    Cancer Father    Heart disease Father    Prostate cancer Brother    Heart disease Brother     Healthy Daughter    Early death Son    Other Son 65       MVA   Healthy Son    Anesthesia problems Neg Hx    Hypotension Neg Hx    Malignant hyperthermia Neg Hx    Pseudochol deficiency Neg Hx     Social History Social History   Tobacco Use   Smoking status: Never   Smokeless tobacco: Never  Vaping Use   Vaping status: Never Used  Substance Use Topics   Alcohol use: No   Drug use: No     Allergies   Ibuprofen and Phenobarbital   Review of Systems Review of Systems Per HPI  Physical Exam Triage Vital Signs ED Triage Vitals  Encounter  Vitals Group     BP 03/22/24 1639 123/73     Girls Systolic BP Percentile --      Girls Diastolic BP Percentile --      Boys Systolic BP Percentile --      Boys Diastolic BP Percentile --      Pulse Rate 03/22/24 1639 64     Resp 03/22/24 1639 18     Temp 03/22/24 1639 97.7 F (36.5 C)     Temp Source 03/22/24 1639 Oral     SpO2 03/22/24 1639 95 %     Weight --      Height --      Head Circumference --      Peak Flow --      Pain Score 03/22/24 1641 8     Pain Loc --      Pain Education --      Exclude from Growth Chart --    No data found.  Updated Vital Signs BP 123/73 (BP Location: Left Arm)   Pulse 64   Temp 97.7 F (36.5 C) (Oral)   Resp 18   SpO2 95%   Visual Acuity Right Eye Distance:   Left Eye Distance:   Bilateral Distance:    Right Eye Near:   Left Eye Near:    Bilateral Near:     Physical Exam Vitals and nursing note reviewed.  Constitutional:      Appearance: Normal appearance. She is not ill-appearing.  HENT:     Head: Atraumatic.  Eyes:     Extraocular Movements: Extraocular movements intact.     Conjunctiva/sclera: Conjunctivae normal.  Cardiovascular:     Rate and Rhythm: Normal rate.  Pulmonary:     Effort: Pulmonary effort is normal.  Musculoskeletal:        General: Tenderness present. No swelling, deformity or signs of injury. Normal range of motion.     Cervical back: Normal  range of motion and neck supple.     Comments: Localized area of tenderness to palpation to the distal right bicep, no deformity on palpation, no appreciable edema, range of motion intact but painful  Skin:    General: Skin is warm and dry.     Findings: No bruising or erythema.  Neurological:     Mental Status: She is alert and oriented to person, place, and time.     Comments: Right upper extremity neurovascularly intact  Psychiatric:        Mood and Affect: Mood normal.        Thought Content: Thought content normal.        Judgment: Judgment normal.      UC Treatments / Results  Labs (all labs ordered are listed, but only abnormal results are displayed) Labs Reviewed - No data to display  EKG   Radiology No results found.  Procedures Procedures (including critical care time)  Medications Ordered in UC Medications - No data to display  Initial Impression / Assessment and Plan / UC Course  I have reviewed the triage vital signs and the nursing notes.  Pertinent labs & imaging results that were available during my care of the patient were reviewed by me and considered in my medical decision making (see chart for details).     Suspect bicep strain, treat with Zanaflex , heat, massage, muscle rubs, stretches, Tylenol  as needed.  Return for worsening symptoms.  Final Clinical Impressions(s) / UC Diagnoses   Final diagnoses:  Right arm pain  Discharge Instructions      I suspect that you have strained your bicep muscle.  In addition to heat, muscle rubs, massage and gentle stretches I have prescribed a muscle relaxer.  Take Tylenol  as needed for pain.  Follow-up for worsening or unresolving symptoms.    ED Prescriptions     Medication Sig Dispense Auth. Provider   tiZANidine  (ZANAFLEX ) 4 MG capsule Take 1 capsule (4 mg total) by mouth 3 (three) times daily as needed for muscle spasms. Do not drink alcohol or drive while taking this medication.  May cause  drowsiness. 15 capsule Stuart Vernell Norris, [place]      PDMP not reviewed this encounter.   Stuart Vernell Norris, [place] 03/22/24 1722

## 2024-03-22 NOTE — Telephone Encounter (Signed)
 FYI Only or Action Required?: Action required by provider: request for appointment and update on patient condition.  Patient has been triaged, but reluctant to see any other provider than Dr. Alphonsa.  Will call back if she wants to schedule  Patient was last seen in primary care on 01/26/2024 by Alphonsa Glendia LABOR, MD.  Called Nurse Triage reporting Arm Pain.  Symptoms began several days ago.  Interventions attempted: OTC medications: tylenol .  Symptoms are: unchanged.  Triage Disposition: See PCP When Office is Open (Within 3 Days)  Patient/caregiver understands and will follow disposition?: Unsure  Copied from CRM #[id number]. Topic: Clinical - Red Word Triage >> Mar 22, 2024 10:36 AM Tinnie BROCKS wrote: Red Word that prompted transfer to Nurse Triage: Wants to see Dr. Alphonsa today [facility]) because her right arm hurts and she is unable to raise it. Reason for Disposition  [1] MODERATE pain (e.g., interferes with normal activities) AND [2] present > 3 days  Answer Assessment - Initial Assessment Questions Called [facility] to verify appointment availability  1. ONSET: When did the pain start?     Several days ago 2. LOCATION: Where is the pain located?    Right arm In between shoulder and elbow 3. PAIN: How bad is the pain? (Scale 0-10; or none, mild, moderate, severe)     severe 4. WORK OR EXERCISE: Has there been any recent work or exercise that involved this part of the body?     denies 5. CAUSE: What do you think is causing the arm pain?     unknown 6. OTHER SYMPTOMS: Do you have any other symptoms? (e.g., neck pain, swelling, rash, fever, numbness, weakness)     denies 7. PREGNANCY: Is there any chance you are pregnant? When was your last menstrual period?     N/a  Protocols used: Arm Pain-A-AH

## 2024-03-26 NOTE — Progress Notes (Signed)
 Office Visit Note  Patient: Terri Douglas             Date of Birth: 11-10-1945           MRN: [id number]             PCP: Alphonsa Glendia LABOR, MD Referring: Alphonsa Glendia LABOR, MD Visit Date: 04/05/2024   Subjective:  Pain of the Right Upper Arm Terri Douglas to Urgent Care x 2 weeks, [place]) and Arthritis   Discussed the use of AI scribe software for clinical note transcription with the patient, who gave verbal consent to proceed.  History of Present Illness   Terri Douglas is a 78 year old female who presents with right shoulder pain.  She has been experiencing severe, sharp pain in her right shoulder for approximately one month, described as akin to being stabbed. She saw urgent care on 10/17 reporting about 3 days after symptom onset without a specific provocation or incident. The pain is exacerbated by certain movements, particularly when lifting the arm overhead, and has progressively worsened over time. It is present daily and significantly impacts her ability to perform daily activities such as reaching above her head to wash her hair.  She has a history of left shoulder reverse replacement surgery, which has healed well. There is no history of trauma or unusual activity preceding the onset of the right shoulder pain. She initially sought care at an urgent care facility where she was told she might have pulled a muscle. She has not received any injections in the shoulder and is currently taking medication prescribed by the urgent care provider to help manage the pain.  There is no radiation of pain down the right arm, and no numbness in the right arm. However, she has some numbness in the left arm and has been told she has a neck problem that requires surgery.      Previous HPI 05/24/21 Terri Douglas is a 78 y.o. 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 She is experiencing  worsening right hip pain bothering her both resting or lying flat but now also with increased pain while walking and feels like her gait is catching or unsteady and is more painful. She has not noticed any recurrence of elbow or knee swelling.   Previous HPI 02/09/21 Terri Douglas is a 78 y.o. female here for follow up for reactive arthritis on celebrex  after stopping sulfasalazine  last visit that seemed to be exacerbating general symptoms.  Since stopping that she has continued ongoing joint pain at multiple sites but feels it is manageable on the Celebrex .  Bili increased problem is having some pain at the lateral right hip and this is particularly bothersome when lying on her side in bed and causing sleep difficulty.  Pain is mostly with direct pressure or when getting up not particularly worsened with weightbearing.    Previous HPI 12/28/20 Terri Douglas is a 78 y.o. female here for joint pain of multiple sites ongoing since a hospitalization last month.  Currently prescribed allopurinol  100 mg daily. She has a more chronic history of osteoarthritis in multiple areas with chronic hip pain.  She has a history of gout with previous attacks in the left and right great toe on treatment with allopurinol  100 mg daily.  She denies any history of previous gout attack outside of her feet. The symptoms have been and  there typical state for some time until she became ill with flank pain and diarrhea symptoms seen at the hospital with work-up indicating uncomplicated yersinia enterocolitis enteritis.  She was treated with colchicine  for suspected gout flare but stopped due to the diarrhea and treated with prednisone  with improvement of symptoms.  She was most recently prescribed oral steroids about 3 weeks ago and primary care follow-up this improves her symptoms though she notices return after stopping it. Currently she has inflammation of multiple sites particularly in the right thumb and index finger and the right  knee and in the left foot.  The right knee is her worst problem she reports swelling that started in the front and has now also involve the back of the knee limiting her ability to flex this and cannot assume a normal seated position.   Review of Systems  Constitutional:  Positive for fatigue.  HENT:  Positive for mouth sores and mouth dryness.   Eyes:  Positive for dryness.  Respiratory:  Negative for shortness of breath.   Cardiovascular:  Negative for chest pain and palpitations.  Gastrointestinal:  Negative for blood in stool, constipation and diarrhea.  Endocrine: Negative for increased urination.  Genitourinary:  Negative for involuntary urination.  Musculoskeletal:  Positive for gait problem, muscle weakness and muscle tenderness. Negative for joint pain, joint pain, joint swelling, myalgias, morning stiffness and myalgias.  Skin:  Positive for hair loss and redness. Negative for color change, rash and sensitivity to sunlight.  Allergic/Immunologic: Negative for susceptible to infections.  Neurological:  Negative for dizziness and headaches.  Hematological:  Positive for bruising/bleeding tendency. Negative for swollen glands.  Psychiatric/Behavioral:  Positive for depressed mood. Negative for sleep disturbance. The patient is nervous/anxious.     PMFS History:  Patient Active Problem List   Diagnosis Date Noted   Pain in right shoulder 04/05/2024   Neck pain on left side 11/13/2023   Muscle spasm of back 11/13/2023   Osteoarthritis of spine with radiculopathy, cervical region 11/13/2023   Asymptomatic superficial varicose vein of both lower extremities 08/24/2023   Petechiae 08/08/2023   Chronic right-sided low back pain with right-sided sciatica 04/12/2023   Stage 3a chronic kidney disease (HCC) 01/16/2023   History of atrial flutter 04/20/2022   Paroxysmal SVT (supraventricular tachycardia) 04/20/2022   Laryngopharyngeal reflux (LPR) 09/28/2020   Primary osteoarthritis of  right hip 01/17/2020   Pedal edema 11/19/2019   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   Insomnia 03/15/2019   Hyperlipidemia 06/18/2014   Gout 06/18/2014   Osteopenia 10/28/2013   Hypertension 03/29/2011    Past Medical History:  Diagnosis Date   Arthritis    Complication of anesthesia    Gall stones    GERD (gastroesophageal reflux disease)    History of cardiac catheterization 2005   Minor elevation in cardiac enzymes however no significant obstructive CAD   Hyperlipidemia    Hypertension    Insomnia    PONV (postoperative nausea and vomiting)    Prediabetes     Family History  Problem Relation Age of Onset   Bone cancer Mother    Cancer Mother    Prostate cancer Father    Cancer Father    Heart disease Father    Prostate cancer Brother    Heart disease Brother    Healthy Daughter    Early death Son    Other Son 27       MVA   Healthy Son    Anesthesia problems Neg  Hx    Hypotension Neg Hx    Malignant hyperthermia Neg Hx    Pseudochol deficiency Neg Hx    Past Surgical History:  Procedure Laterality Date   A-FLUTTER ABLATION N/A 08/01/2017   Procedure: A-FLUTTER ABLATION;  Surgeon: Kelsie Agent, MD;  Location: [facility];  Service: Cardiovascular;  Laterality: N/A;   BACK SURGERY  1992   CARDIAC CATHETERIZATION     BACK IN 2004  SHE THINKS IT CAME BACK 'NORMAL'   CHOLECYSTECTOMY N/A 06/24/2015   Procedure: LAPAROSCOPIC CHOLECYSTECTOMY;  Surgeon: Herlene Beverley Bureau, MD;  Location[facility];  Service: General;  Laterality: N/A;   COLONOSCOPY N/A 08/30/2012   Procedure: COLONOSCOPY;  Surgeon: Claudis RAYMOND Rivet, MD;  Location: [facility];  Service: Endoscopy;  Laterality: N/A;  830   DILATION AND CURETTAGE OF UTERUS     ESOPHAGOGASTRODUODENOSCOPY  04/15/2011   Procedure: ESOPHAGOGASTRODUODENOSCOPY (EGD);  Surgeon: Claudis RAYMOND Rivet, MD;  Location: [facility];  Service: Endoscopy;  Laterality: N/A;  11:30   ESOPHAGOGASTRODUODENOSCOPY  N/A 05/27/2016   Procedure: ESOPHAGOGASTRODUODENOSCOPY (EGD);  Surgeon: Claudis RAYMOND Rivet, MD;  Location: [facility];  Service: Endoscopy;  Laterality: N/A;  11:15   EYE SURGERY     cataract removal   NECK SURGERY  12/13/2018   REVERSE SHOULDER ARTHROPLASTY Left 04/03/2019   Procedure: REVERSE SHOULDER ARTHROPLASTY;  Surgeon: Cristy Bonner DASEN, MD;  Location: [facility];  Service: Orthopedics;  Laterality: Left;   TOTAL HIP ARTHROPLASTY Right 07/06/2023   VAGINAL HYSTERECTOMY     Social History   Social History Narrative   Right handed    Immunization History  Administered Date(s) Administered   Fluad Quad(high Dose 65+) 03/16/2020, 03/24/2022   INFLUENZA, HIGH DOSE SEASONAL PF 04/07/2018, 03/15/2019   Influenza,inj,Quad PF,6+ Mos 03/20/2015, 04/01/2016   Influenza-Unspecified [birth year], 03/06/2012, 03/06/2014, 03/06/2018, 04/06/2018, 03/16/2019, 04/10/2023   Moderna Sars-Covid-2 Vaccination 07/18/2019, 08/16/2019, 03/20/2020   Pneumococcal Conjugate-13 06/18/2014   Pneumococcal Polysaccharide-23 03/15/2019   Td 11/10/2006   Zoster Recombinant(Shingrix) 04/07/2018, 06/19/2018   Zoster, Live 03/05/2014     Objective: Vital Signs: BP 122/72 (BP Location: Left Arm, Patient Position: Sitting, Cuff Size: Normal)   Pulse 61   Temp (!) 97.5 F (36.4 C)   Resp 16   Ht 5' 2 (1.575 m)   Wt 136 lb (61.7 kg)   BMI 24.87 kg/m    Physical Exam Eyes:     Conjunctiva/sclera: Conjunctivae normal.  Cardiovascular:     Rate and Rhythm: Normal rate and regular rhythm.  Pulmonary:     Effort: Pulmonary effort is normal.     Breath sounds: Normal breath sounds.  Lymphadenopathy:     Cervical: No cervical adenopathy.  Skin:    General: Skin is warm and dry.  Neurological:     Mental Status: She is alert.  Psychiatric:        Mood and Affect: Mood normal.      Musculoskeletal Exam:  Right shoulder tenderness to pressure on lateral and anterior side, no palpable effusion. Pain increased  with external rotation worse when raised, passive overhead abduction guarding tightly but full ROM Elbows full ROM no tenderness or swelling Wrists full ROM no tenderness or swelling Fingers full ROM no tenderness or swelling    Investigation: No additional findings.  Imaging: XR Shoulder Right Result Date: 04/05/2024 Xray right shoulder 4 views Normal humerus head in internal and external rotation.  No visible effusion or abnormal calcifications seen.  Probable mild spurring or enthesophytes  at tubercle.  AC joint appears normal. Impression No acute appearing bony abnormality of fracture or dislocation, mild degenerative changes   Recent Labs: Lab Results  Component Value Date   WBC 7.3 10/05/2023   HGB 12.1 10/05/2023   PLT 293 10/05/2023   NA 133 (L) 10/05/2023   K 4.5 10/05/2023   CL 93 (L) 10/05/2023   CO2 24 10/05/2023   GLUCOSE 77 10/05/2023   BUN 21 10/05/2023   CREATININE 1.29 (H) 10/05/2023   BILITOT 0.3 09/20/2023   ALKPHOS 91 09/20/2023   AST 22 09/20/2023   ALT 18 09/20/2023   PROT 6.0 09/20/2023   ALBUMIN 4.2 09/20/2023   CALCIUM  8.9 10/05/2023   GFRAA 65 05/21/2020   QFTBGOLDPLUS NEGATIVE 12/31/2020    Speciality Comments: No specialty comments available.  Procedures:  Large Joint Inj: R subacromial bursa on 04/05/2024 12:40 PM Indications: pain Details: 27 G 1.5 in needle, lateral approach Medications: 2 mL lidocaine  1 %; 40 mg triamcinolone  acetonide 40 MG/ML Outcome: tolerated well, no immediate complications Procedure, treatment alternatives, risks and benefits explained, specific risks discussed. Consent was given by the patient. Immediately prior to procedure a time out was called to verify the correct patient, procedure, equipment, support staff and site/side marked as required. Patient was prepped and draped in the usual sterile fashion.     Allergies: Ibuprofen and Phenobarbital   Assessment / Plan:     Visit Diagnoses: Acute pain of  right shoulder - Plan: XR Shoulder Right Right shoulder rotator cuff strain Suspected rotator cuff injury with impingement, likely due to tendon pinching between shoulder blade and arm bone. She did not recall any provocation so likely a preexisting injury in exacerbation. Persistent pain limits overhead activities. - Order x-ray of right shoulder to assess contraindications- no acute bony abnormality  - Intraarticular steroid injection Subacromial today - F/U 6wks PRN, but probably needs PT or ortho consultation next step if pain returns       Orders: Orders Placed This Encounter  Procedures   Large Joint Inj   XR Shoulder Right   No orders of the defined types were placed in this encounter.    Follow-Up Instructions: Return in about 6 weeks (around 05/17/2024), or if symptoms worsen or fail to improve, for Right shoulder pain inj f/u PRN.   Lonni LELON Ester, MD  Note - This record has been created using [employer].  Chart creation errors have been sought, but may not always  have been located. Such creation errors do not reflect on  the standard of medical care.

## 2024-04-05 ENCOUNTER — Ambulatory Visit

## 2024-04-05 ENCOUNTER — Encounter: Payer: Self-pay | Admitting: Internal Medicine

## 2024-04-05 ENCOUNTER — Ambulatory Visit: Attending: Orthopedic Surgery | Admitting: Internal Medicine

## 2024-04-05 VITALS — BP 122/72 | HR 61 | Temp 97.5°F | Resp 16 | Ht 62.0 in | Wt 136.0 lb

## 2024-04-05 DIAGNOSIS — M25511 Pain in right shoulder: Secondary | ICD-10-CM | POA: Diagnosis not present

## 2024-04-05 MED ORDER — LIDOCAINE HCL 1 % IJ SOLN
2.0000 mL | INTRAMUSCULAR | Status: AC | PRN
  Administered 2024-04-05: 2 mL

## 2024-04-05 MED ORDER — TRIAMCINOLONE ACETONIDE 40 MG/ML IJ SUSP
40.0000 mg | INTRAMUSCULAR | Status: AC | PRN
  Administered 2024-04-05: 40 mg via INTRA_ARTICULAR

## 2024-04-25 DIAGNOSIS — Z01419 Encounter for gynecological examination (general) (routine) without abnormal findings: Secondary | ICD-10-CM | POA: Diagnosis not present

## 2024-04-25 DIAGNOSIS — Z79890 Hormone replacement therapy (postmenopausal): Secondary | ICD-10-CM | POA: Diagnosis not present

## 2024-04-25 DIAGNOSIS — Z1231 Encounter for screening mammogram for malignant neoplasm of breast: Secondary | ICD-10-CM | POA: Diagnosis not present

## 2024-04-25 DIAGNOSIS — Z6825 Body mass index (BMI) 25.0-25.9, adult: Secondary | ICD-10-CM | POA: Diagnosis not present

## 2024-04-30 DIAGNOSIS — N1831 Chronic kidney disease, stage 3a: Secondary | ICD-10-CM | POA: Diagnosis not present

## 2024-04-30 DIAGNOSIS — R809 Proteinuria, unspecified: Secondary | ICD-10-CM | POA: Diagnosis not present

## 2024-04-30 DIAGNOSIS — I10 Essential (primary) hypertension: Secondary | ICD-10-CM | POA: Diagnosis not present

## 2024-05-01 DIAGNOSIS — Z23 Encounter for immunization: Secondary | ICD-10-CM | POA: Diagnosis not present

## 2024-05-13 NOTE — Progress Notes (Signed)
 "  Office Visit Note  Patient: Terri Douglas             Date of Birth: [birth year]           MRN: [id number]             PCP: Alphonsa Glendia LABOR, MD Referring: Alphonsa Glendia LABOR, MD Visit Date: 05/16/2024   Subjective:  Medication Management (Patient has had fall on two different occasions not sure if anything was messed up)   Discussed the use of AI scribe software for clinical note transcription with the patient, who gave verbal consent to proceed.  History of Present Illness   Terri Douglas is a 78 year old female with Sjogren's syndrome who presents with recent balance issues. She was referred by Dr. Lazarus for evaluation of Sjogren's syndrome and changes in kidney function.  She uses Systane eye drops and drinks cold water  to manage these symptoms. She recently saw an eye doctor and received a new prescription due to vision changes. No recent dental issues or swelling under the jaw or cheeks.  She reports recent balance issues, leading to two falls, one off a patio and another missing a bed. She attributes these to balance issues but denies dizziness or vertigo, mentioning clumsiness instead. No numbness in her feet.  She takes furosemide  every morning for fluid retention and experiences swelling in her ankle. She has tried compression socks but finds them difficult to put on. Her skin has developed brown spots and actinic keratoses, which she attributes to aging and sun exposure.  She has a GFR of 46 from her most recent blood test. She is not aware of any high blood pressure or diabetes contributing to her kidney issues. Her blood pressure is reportedly low.       Previous HPI 04/05/2024 Terri Douglas is a 78 year old female who presents with right shoulder pain.   She has been experiencing severe, sharp pain in her right shoulder for approximately one month, described as akin to being stabbed. She saw urgent care on 10/17 reporting about 3 days after symptom onset without a  specific provocation or incident. The pain is exacerbated by certain movements, particularly when lifting the arm overhead, and has progressively worsened over time. It is present daily and significantly impacts her ability to perform daily activities such as reaching above her head to wash her hair.   She has a history of left shoulder reverse replacement surgery, which has healed well. There is no history of trauma or unusual activity preceding the onset of the right shoulder pain. She initially sought care at an urgent care facility where she was told she might have pulled a muscle. She has not received any injections in the shoulder and is currently taking medication prescribed by the urgent care provider to help manage the pain.   There is no radiation of pain down the right arm, and no numbness in the right arm. However, she has some numbness in the left arm and has been told she has a neck problem that requires surgery.       Previous HPI 05/24/21 Terri Douglas is a 77 y.o. 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 She is experiencing worsening right hip pain bothering her both resting or lying flat but now also with increased pain while walking and feels like her gait is catching or unsteady  and is more painful. She has not noticed any recurrence of elbow or knee swelling.   Previous HPI 02/09/21 Terri Douglas is a 78 y.o. female here for follow up for reactive arthritis on celebrex  after stopping sulfasalazine  last visit that seemed to be exacerbating general symptoms.  Since stopping that she has continued ongoing joint pain at multiple sites but feels it is manageable on the Celebrex .  Bili increased problem is having some pain at the lateral right hip and this is particularly bothersome when lying on her side in bed and causing sleep difficulty.  Pain is mostly with direct pressure or  when getting up not particularly worsened with weightbearing.    Previous HPI 12/28/20 Terri Douglas is a 78 y.o. female here for joint pain of multiple sites ongoing since a hospitalization last month.  Currently prescribed allopurinol  100 mg daily. She has a more chronic history of osteoarthritis in multiple areas with chronic hip pain.  She has a history of gout with previous attacks in the left and right great toe on treatment with allopurinol  100 mg daily.  She denies any history of previous gout attack outside of her feet. The symptoms have been and there typical state for some time until she became ill with flank pain and diarrhea symptoms seen at the hospital with work-up indicating uncomplicated yersinia enterocolitis enteritis.  She was treated with colchicine  for suspected gout flare but stopped due to the diarrhea and treated with prednisone  with improvement of symptoms.  She was most recently prescribed oral steroids about 3 weeks ago and primary care follow-up this improves her symptoms though she notices return after stopping it. Currently she has inflammation of multiple sites particularly in the right thumb and index finger and the right knee and in the left foot.  The right knee is her worst problem she reports swelling that started in the front and has now also involve the back of the knee limiting her ability to flex this and cannot assume a normal seated position.   Review of Systems  Constitutional:  Negative for fatigue.  HENT:  Positive for mouth dryness. Negative for mouth sores.   Eyes:  Positive for dryness.  Respiratory:  Negative for shortness of breath.   Cardiovascular:  Negative for chest pain and palpitations.  Gastrointestinal:  Positive for diarrhea. Negative for blood in stool and constipation.  Endocrine: Negative for increased urination.  Genitourinary:  Negative for involuntary urination.  Musculoskeletal:  Positive for gait problem. Negative for joint pain,  joint pain, joint swelling, myalgias, muscle weakness, morning stiffness, muscle tenderness and myalgias.  Skin:  Positive for hair loss. Negative for color change, rash and sensitivity to sunlight.  Allergic/Immunologic: Negative for susceptible to infections.  Neurological:  Negative for dizziness and headaches.  Hematological:  Negative for swollen glands.  Psychiatric/Behavioral:  Positive for depressed mood and sleep disturbance. The patient is nervous/anxious.     PMFS History:  Patient Active Problem List   Diagnosis Date Noted   Pain in right shoulder 04/05/2024   Neck pain on left side 11/13/2023   Muscle spasm of back 11/13/2023   Osteoarthritis of spine with radiculopathy, cervical region 11/13/2023   Asymptomatic superficial varicose vein of both lower extremities 08/24/2023   Petechiae 08/08/2023   Chronic right-sided low back pain with right-sided sciatica 04/12/2023   Stage 3a chronic kidney disease (HCC) 01/16/2023   History of atrial flutter 04/20/2022   Paroxysmal SVT (supraventricular tachycardia) 04/20/2022   Laryngopharyngeal reflux (LPR)  09/28/2020   Primary osteoarthritis of right hip 01/17/2020   Pedal edema 11/19/2019   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   Insomnia 03/15/2019   Hyperlipidemia 06/18/2014   Gout 06/18/2014   Osteopenia 10/28/2013   Hypertension 03/29/2011    Past Medical History:  Diagnosis Date   Arthritis    Complication of anesthesia    Gall stones    GERD (gastroesophageal reflux disease)    History of cardiac catheterization 2005   Minor elevation in cardiac enzymes however no significant obstructive CAD   Hyperlipidemia    Hypertension    Insomnia    PONV (postoperative nausea and vomiting)    Prediabetes     Family History  Problem Relation Age of Onset   Bone cancer Mother    Cancer Mother    Prostate cancer Father    Cancer Father    Heart disease Father    Prostate cancer Brother    Heart  disease Brother    Healthy Daughter    Early death Son    Other Son 53       MVA   Healthy Son    Anesthesia problems Neg Hx    Hypotension Neg Hx    Malignant hyperthermia Neg Hx    Pseudochol deficiency Neg Hx    Past Surgical History:  Procedure Laterality Date   A-FLUTTER ABLATION N/A 08/01/2017   Procedure: A-FLUTTER ABLATION;  Surgeon: Kelsie Agent, MD;  Location: [facility];  Service: Cardiovascular;  Laterality: N/A;   BACK SURGERY  1992   CARDIAC CATHETERIZATION     BACK IN 2004  SHE THINKS IT CAME BACK 'NORMAL'   CHOLECYSTECTOMY N/A 06/24/2015   Procedure: LAPAROSCOPIC CHOLECYSTECTOMY;  Surgeon: Herlene Beverley Bureau, MD;  Location: [facility];  Service: General;  Laterality: N/A;   COLONOSCOPY N/A 08/30/2012   Procedure: COLONOSCOPY;  Surgeon: Claudis RAYMOND Rivet, MD;  Location: [facility];  Service: Endoscopy;  Laterality: N/A;  830   DILATION AND CURETTAGE OF UTERUS     ESOPHAGOGASTRODUODENOSCOPY  04/15/2011   Procedure: ESOPHAGOGASTRODUODENOSCOPY (EGD);  Surgeon: Claudis RAYMOND Rivet, MD;  Location: [facility];  Service: Endoscopy;  Laterality: N/A;  11:30   ESOPHAGOGASTRODUODENOSCOPY N/A 05/27/2016   Procedure: ESOPHAGOGASTRODUODENOSCOPY (EGD);  Surgeon: Claudis RAYMOND Rivet, MD;  Location: [facility];  Service: Endoscopy;  Laterality: N/A;  11:15   EYE SURGERY     cataract removal   NECK SURGERY  12/13/2018   REVERSE SHOULDER ARTHROPLASTY Left 04/03/2019   Procedure: REVERSE SHOULDER ARTHROPLASTY;  Surgeon: Cristy Bonner DASEN, MD;  Location: [facility];  Service: Orthopedics;  Laterality: Left;   TOTAL HIP ARTHROPLASTY Right 07/06/2023   VAGINAL HYSTERECTOMY     Social History   Social History Narrative   Right handed    Immunization History  Administered Date(s) Administered   Fluad Quad(high Dose 65+) 03/16/2020, 03/24/2022   INFLUENZA, HIGH DOSE SEASONAL PF 04/07/2018, 03/15/2019   Influenza,inj,Quad PF,6+ Mos 03/20/2015, 04/01/2016   Influenza-Unspecified  03-30-1946, 03/06/2012, 03/06/2014, 03/06/2018, 04/06/2018, 03/16/2019, 04/10/2023   Moderna Sars-Covid-2 Vaccination 07/18/2019, 08/16/2019, 03/20/2020   Pneumococcal Conjugate-13 06/18/2014   Pneumococcal Polysaccharide-23 03/15/2019   Td 11/10/2006   Zoster Recombinant(Shingrix) 04/07/2018, 06/19/2018   Zoster, Live 03/05/2014     Objective: Vital Signs: BP 137/79   Pulse (!) 59   Temp (!) 96.9 F (36.1 C)   Resp 16   Ht 5' 2 (1.575 m)   Wt 136 lb 9.6 oz (62 kg)   BMI 24.98  kg/m    Physical Exam Eyes:     Conjunctiva/sclera: Conjunctivae normal.  Cardiovascular:     Rate and Rhythm: Normal rate and regular rhythm.  Pulmonary:     Effort: Pulmonary effort is normal.     Breath sounds: Normal breath sounds.  Musculoskeletal:     Comments: Right ankle edema  Lymphadenopathy:     Cervical: No cervical adenopathy.  Skin:    General: Skin is warm and dry.  Neurological:     Mental Status: She is alert.  Psychiatric:        Mood and Affect: Mood normal.      Musculoskeletal Exam:  Shoulders full ROM no tenderness or swelling Elbows full ROM no tenderness or swelling Wrists full ROM no tenderness or swelling Fingers full ROM no tenderness or swelling Knees full ROM no tenderness or swelling  Investigation: No additional findings.  Imaging: No results found.  Recent Labs: Lab Results  Component Value Date   WBC 7.3 10/05/2023   HGB 12.1 10/05/2023   PLT 293 10/05/2023   NA 133 (L) 10/05/2023   K 4.5 10/05/2023   CL 93 (L) 10/05/2023   CO2 24 10/05/2023   GLUCOSE 77 10/05/2023   BUN 21 10/05/2023   CREATININE 1.29 (H) 10/05/2023   BILITOT 0.3 09/20/2023   ALKPHOS 91 09/20/2023   AST 22 09/20/2023   ALT 18 09/20/2023   PROT 6.0 09/20/2023   ALBUMIN 4.2 09/20/2023   CALCIUM  8.9 10/05/2023   GFRAA 65 05/21/2020   QFTBGOLDPLUS NEGATIVE 12/31/2020    Speciality Comments: No specialty comments available.  Procedures:  No procedures  performed Allergies: Ibuprofen and Phenobarbital   Assessment / Plan:     Visit Diagnoses: Sjogren's syndrome with keratoconjunctivitis sicca - Plan: Sedimentation rate, Rheumatoid factor, C3 and C4 Positive anti-SSA antibody. Dry eyes and mouth. No systemic inflammation or significant organ involvement. Decreased kidney function not linked to Sjogren's. - Ordered blood tests for systemic inflammation, SSA, RF risk stratification. - Provided management strategies for dry symptoms. - Recommended humidifier use at night. - Consider sugar-free gum or lozenges for saliva. - Consider Biotene products for saliva stimulation.   Acute pain of right shoulder - S/P R subacromial bursa 04/05/2024 Right shoulder pain Improved post-injection. No current need for repeat injection. - Consider repeat injection if symptoms worsen.   Orders: Orders Placed This Encounter  Procedures   Sedimentation rate   Rheumatoid factor   C3 and C4   No orders of the defined types were placed in this encounter.    Follow-Up Instructions: No follow-ups on file.   Lonni LELON Ester, MD  Note - This record has been created using [employer].  Chart creation errors have been sought, but may not always  have been located. Such creation errors do not reflect on  the standard of medical care. "

## 2024-05-16 ENCOUNTER — Encounter: Payer: Self-pay | Admitting: Internal Medicine

## 2024-05-16 ENCOUNTER — Ambulatory Visit: Attending: Orthopedic Surgery | Admitting: Internal Medicine

## 2024-05-16 VITALS — BP 137/79 | HR 59 | Temp 96.9°F | Resp 16 | Ht 62.0 in | Wt 136.6 lb

## 2024-05-16 DIAGNOSIS — M3501 Sicca syndrome with keratoconjunctivitis: Secondary | ICD-10-CM

## 2024-05-16 DIAGNOSIS — M25511 Pain in right shoulder: Secondary | ICD-10-CM

## 2024-05-16 NOTE — Patient Instructions (Signed)
 I recommend symptom treatments for eye dryness including lubricating eye drops and can use gel or ointment based products for overnight.  Also consider use of humidifier at night during dry weather.  Use follow-up regularly with your eye doctor.  If symptoms worsen there are several types of medicated eyedrop that can help with the dryness or inflammation.  For chronic dry mouth is important to stay well-hydrated.  You can also use sugar-free gum or lozenges to stimulate the saliva production.  Biotene mouthwash or lozenges may also be helpful.  Products into [employer] also work to stimulate saliva production.  Continue following up with your dentist regularly because dryness problems can increase the risk of tooth and gum decay.  If symptoms get worse there are medications for stimulating tear and saliva production but will try all the other options first.  I recommend to try adding a humidifier in the bedroom during winter season to minimize overnight dryness.

## 2024-05-17 LAB — RHEUMATOID FACTOR: Rheumatoid fact SerPl-aCnc: <10 [IU]/mL (ref <14)

## 2024-05-17 LAB — C3 AND C4
C3 Complement: 166 mg/dL (ref 83–193)
C4 Complement: 32 mg/dL (ref 15–57)

## 2024-05-17 LAB — SEDIMENTATION RATE: Sed Rate: 31 mm/h — ABNORMAL HIGH (ref 0–30)

## 2024-05-20 ENCOUNTER — Other Ambulatory Visit: Payer: Self-pay | Admitting: Family Medicine

## 2024-06-09 ENCOUNTER — Other Ambulatory Visit: Payer: Self-pay | Admitting: Family Medicine

## 2024-06-09 ENCOUNTER — Encounter: Payer: Self-pay | Admitting: Family Medicine

## 2024-06-09 DIAGNOSIS — N1831 Chronic kidney disease, stage 3a: Secondary | ICD-10-CM

## 2024-06-09 DIAGNOSIS — E7849 Other hyperlipidemia: Secondary | ICD-10-CM

## 2024-06-09 DIAGNOSIS — Z79899 Other long term (current) drug therapy: Secondary | ICD-10-CM

## 2024-06-12 ENCOUNTER — Ambulatory Visit
Admit: 2024-06-12 | Discharge: 2024-06-12 | Payer: MEDICARE | Attending: Cardiovascular Disease | Primary: Physician Assistant

## 2024-06-12 VITALS — BP 134/78 | HR 59 | Ht 66.0 in | Wt 188.2 lb

## 2024-06-12 DIAGNOSIS — I471 Supraventricular tachycardia, unspecified: Principal | ICD-10-CM

## 2024-06-12 MED ORDER — HYDRALAZINE HCL 50 MG PO TABS
50 | Freq: Three times a day (TID) | ORAL | 3 refills | 30.00000 days | Status: AC
Start: 2024-06-12 — End: ?

## 2024-06-12 MED ORDER — ATORVASTATIN CALCIUM 80 MG PO TABS
80 | ORAL_TABLET | Freq: Every day | ORAL | 1 refills | 90.00000 days | Status: AC
Start: 2024-06-12 — End: ?

## 2024-06-12 NOTE — Progress Notes (Signed)
 HISTORY OF PRESENT ILLNESS  Krystal Hood is a 79 y.o. female.    PMH HTN, OSA on CPAP  ----  CARDIAC STUDIES  ----  2d echo 01/04/2023    Left Ventricle: Normal left ventricular systolic function with a visually estimated EF of 55 - 60%. Left ventricle size is normal. Normal wall thickness. Normal wall motion. Normal diastolic function.    Tricuspid Valve: Unable to assess RVSP due to inadequate or insignificant tricuspid regurgitation.    Aorta: Normal sized aortic root. Mildly dilated ascending aorta. Ao ascending diameter is 3.5 cm.    Image quality is adequate.  ----  Heart monitor ziopatch 09/2022  SVT present with heart rate 126bpm to 140bpm  PACs occasional and PVC rare  167 SVT runs with the fastest interval lasting 2 minutes 56 seconds and max rate of 200bpm longest lasting 45 minutes 1 second with average rate of 133bpm, idioventricular rhythm.    ----  2d echo 09/2016  Stress 2D echocardiographic results  Baseline: Left ventricular size was normal. Overall left ventricular systolic   function was normal. Estimated left  ventricular ejection fraction was 60 % .     Peak stress: LV size was unchanged from the previous stage to mildly dilated   in some views. There was no change in LV  function since the previous stage with some hypokinesia in the inferior wall  ----  CTA of the chest 09/13/2022  No evidence of pulmonary embolism or other acute process.   ----  Duplex renal arteries 09/14/2016  INTERPRETATION/FINDINGS   Duplex images were obtained using 2-D gray scale, color flow, and   spectral Doppler analysis.   RENAL:   1. No significant renal artery stenosis identified, both renal   arteries visualized.   2. The right kidney measures 10.3 cm.   3. The left kidney measures 10.6 cm.   4. The renal vein is patent bilaterally.   5.  No evidence of aneurysm noted in the abdominal aorta.   ----    Have you had fatigue?    2.   Have you had chest pain?   Is your chest pain substernal?  Does your chest pain occur  with exertion or stress?   Is your chest pain relieved with nitroglycerin?  Is your chest pain relieved with rest?  3.   Have you had dyspnea (SOB)?    How much activity does it take before you become SOB?  4.   Have you had orthopnea?   5.   Have you had PND?    6.   Have you had leg swelling?     7.   Have you had any weight gain?    8.   Have you had any palpitations?    9.   Have you had any syncope?   10. Do you have any wounds on legs?         Reviewed with the patient and is as such.      Family History   Problem Relation Age of Onset    Cancer Mother 21        lymphoma    Heart Attack Father     Heart Attack Sister        Past Medical History:   Diagnosis Date    Hypertension     Thyroid disease     hypothyroidism    Unspecified sleep apnea     Doesn't use CPAP       Past  Surgical History:   Procedure Laterality Date    KNEE ARTHROSCOPY Right     OTHER SURGICAL HISTORY      Fatty removed from left ankle    TONSILLECTOMY AND ADENOIDECTOMY      TUBAL LIGATION         Social History     Tobacco Use    Smoking status: Former    Smokeless tobacco: Never   Substance Use Topics    Alcohol use: No       Allergies   Allergen Reactions    Hydromorphone Nausea And Vomiting    Meperidine Nausea And Vomiting       Prior to Admission medications   Medication Sig Start Date End Date Taking? Authorizing Provider   hydrALAZINE  (APRESOLINE ) 50 MG tablet Take 1 tablet by mouth 3 times daily 08/15/23   Ivery Debby SQUIBB, MD   atorvastatin  (LIPITOR) 40 MG tablet Take 1 tablet by mouth daily    [provider]   chlorthalidone (HYGROTON) 25 MG tablet Take 1 tablet by mouth daily    [provider]   losartan (COZAAR) 100 MG tablet Take 1 tablet by mouth daily    [provider]   sertraline (ZOLOFT) 100 MG tablet Take 1 tablet by mouth daily    [provider]   metoprolol succinate (TOPROL XL) 25 MG extended release tablet Take 1 tablet by mouth daily    [provider]   Levothyroxine  Sodium 112 MCG CAPS Take 112 mcg by mouth Daily    Automatic Reconciliation, Ar       No results found for: LIPIDPAN, BMP, CMP     There were no vitals taken for this visit.    HPI    Review of Systems   Constitutional:  Negative for activity change, appetite change, diaphoresis, fatigue and unexpected weight change.   Eyes:  Negative for visual disturbance.   Respiratory:  Negative for apnea, cough, chest tightness, shortness of breath and wheezing.    Cardiovascular:  Negative for chest pain, palpitations and leg swelling.   Gastrointestinal:  Negative for abdominal pain, blood in stool, constipation, diarrhea and nausea.   Endocrine: Negative for cold intolerance and heat intolerance.   Genitourinary:  Negative for decreased urine volume and difficulty urinating.   Musculoskeletal:  Negative for gait problem, myalgias and neck pain.   Skin:  Negative for color change, pallor and rash.   Neurological:  Negative for dizziness, syncope, facial asymmetry, speech difficulty, weakness, light-headedness and numbness.   Psychiatric/Behavioral:  Negative for confusion and sleep disturbance.          Physical Exam  Vitals reviewed.   Constitutional:       General: She is not in acute distress.     Appearance: Normal appearance. She is not ill-appearing or diaphoretic.   HENT:      Head: Normocephalic and atraumatic.   Eyes:      General: No scleral icterus.        Right eye: No discharge.         Left eye: No discharge.      Conjunctiva/sclera: Conjunctivae normal.   Neck:      Vascular: No carotid bruit.   Cardiovascular:      Rate and Rhythm: Normal rate and regular rhythm.      Heart sounds: Normal heart sounds. No murmur heard.     No friction rub. No gallop.   Pulmonary:      Effort: Pulmonary  effort is normal. No respiratory distress.      Breath sounds: Normal breath sounds. No wheezing, rhonchi or rales.   Chest:      Chest wall: No tenderness.   Abdominal:      General: Bowel sounds are normal. There is no  distension.      Palpations: Abdomen is soft.      Tenderness: There is no abdominal tenderness. There is no guarding.   Musculoskeletal:         General: No swelling or tenderness.      Cervical back: Neck supple.      Right lower leg: No edema.      Left lower leg: No edema.   Skin:     General: Skin is warm and dry.      Findings: No erythema or rash.   Neurological:      Mental Status: She is alert. Mental status is at baseline.      Motor: No weakness.      Gait: Gait normal.   Psychiatric:         Mood and Affect: Mood normal.         Behavior: Behavior normal.         Thought Content: Thought content normal.         ASSESSMENT and PLAN  Krystal Hood has a reminder for a due or due soon health maintenance. I have asked that she contact her primary care prov  ider for follow-up on this health maintenance.    SVT - Continue with metoprolol succinate 25mg  po daily, states that palpitations well controlled.  Without atrial fibrillation present, can discontinue eliquis.  Patient presented picture of monitor that shows atrial tachycardia at 127bpm that was regular from the emergency department.  2D echo 12/2022 with preserved ejection fraction 55 to 60 percent.      HTN - Stage I pressure systolic with normotensive diastolic pressure, acceptable with goal less than 150/104mmHg, continue with metoprolol succinate 25mg  po daily, continue with losartan 100mg  po daily, continue chlorthalidone 55m mg po daily, Continue hydralazine  50mg  po TID.      HLD - Continue with lipitor but change to 80mg  po HS, survey lipid panel periodically.  Patient willing to start 81mg  po daily to reduce risks ATP III risk at 14 percent.      OSA on CPAP

## 2024-06-12 NOTE — Progress Notes (Signed)
 Have you had fatigue?    2.   Have you had chest pain?   Is your chest pain substernal?  Does your chest pain occur with exertion or stress?   Is your chest pain relieved with nitroglycerin ?  Is your chest pain relieved with rest?  3.   Have you had dyspnea (SOB)?    How much activity does it take before you become SOB?  4.   Have you had orthopnea?   5.   Have you had PND?    6.   Have you had leg swelling?     7.   Have you had any weight gain?    8.   Have you had any palpitations?    9.   Have you had any syncope?   10. Do you have any wounds on legs?

## 2024-06-12 NOTE — Patient Instructions (Signed)
 "     Learning About the Mediterranean Diet  What is the Mediterranean diet?     The Mediterranean diet is a style of eating rather than a diet plan. It features foods eaten in [place], [place], [place] and [place], and other countries along the [employer]. It emphasizes eating foods like fish, fruits, vegetables, beans, high-fiber breads and whole grains, nuts, and olive oil. This style of eating includes limited red meat, cheese, and sweets.  Why choose the Mediterranean diet?  A Mediterranean-style diet may improve heart health. It contains more fat than other heart-healthy diets. But the fats are mainly from nuts, unsaturated oils (such as fish oils and olive oil), and certain nut or seed oils (such as canola, soybean, or flaxseed oil). These fats may help protect the heart and blood vessels.  How can you get started on the Mediterranean diet?  Here are some things you can do to switch to a more Mediterranean way of eating.  What to eat  Eat a variety of fruits and vegetables each day, such as grapes, blueberries, tomatoes, broccoli, peppers, figs, olives, spinach, eggplant, beans, lentils, and chickpeas.  Eat a variety of whole-grain foods each day, such as oats, brown rice, and whole wheat bread, pasta, and couscous.  Eat fish at least 2 times a week. Try tuna, salmon, mackerel, lake trout, herring, or sardines.  Eat moderate amounts of low-fat dairy products, such as milk, cheese, or yogurt.  Eat moderate amounts of poultry and eggs.  Choose healthy (unsaturated) fats, such as nuts, olive oil, and certain nut or seed oils like canola, soybean, and flaxseed.  Limit unhealthy (saturated) fats, such as butter, palm oil, and coconut oil. And limit fats found in animal products, such as meat and dairy products made with whole milk. Try to eat red meat only a few times a month in very small amounts.  Limit sweets and desserts to only a few times a week. This includes sugar-sweetened drinks like soda.  The  Mediterranean diet may also include red wine with your meal--1 glass each day for women and up to 2 glasses a day for men.  Tips for eating at home  Use herbs, spices, garlic, lemon zest, and citrus juice instead of salt to add flavor to foods.  Add avocado slices to your sandwich instead of bacon.  Have fish for lunch or dinner instead of red meat. Brush the fish with olive oil, and broil or grill it.  Sprinkle your salad with seeds or nuts instead of cheese.  Cook with olive or canola oil instead of butter or oils that are high in saturated fat.  Switch from 2% milk or whole milk to 1% or fat-free milk.  Dip raw vegetables in a vinaigrette dressing or hummus instead of dips made from mayonnaise or sour cream.  Have a piece of fruit for dessert instead of a piece of cake. Try baked apples, or have some dried fruit.  Tips for eating out  Try broiled, grilled, baked, or poached fish instead of having it fried or breaded.  Ask your server to have your meals prepared with olive oil instead of butter.  Order dishes made with marinara sauce or sauces made from olive oil. Avoid sauces made from cream or mayonnaise.  Choose whole-grain breads, whole wheat pasta and pizza crust, brown rice, beans, and lentils.  Cut back on butter or margarine on bread. Instead, you can dip your bread in a small amount of olive oil.  Ask for a side salad or grilled vegetables instead of french fries or chips.  Where can you learn more?  Go to [employer] and enter O407 to learn more about Learning About the Mediterranean Diet.  Current as of: Oct 12, 2020               Content Version: 13.5   2006-2022 [employer].   Care instructions adapted under license by [facility]. If you have questions about a medical condition or this instruction, always ask your healthcare professional. [employer] disclaims any warranty or liability for your use of this information.     "

## 2024-06-28 ENCOUNTER — Other Ambulatory Visit: Payer: Self-pay | Admitting: Family Medicine

## 2024-06-28 DIAGNOSIS — K219 Gastro-esophageal reflux disease without esophagitis: Secondary | ICD-10-CM

## 2024-07-29 ENCOUNTER — Ambulatory Visit: Admitting: Family Medicine

## 2024-08-22 ENCOUNTER — Ambulatory Visit: Admitting: Neurology
